# Patient Record
Sex: Male | Born: 1955 | Race: White | Hispanic: No | Marital: Single | Smoking: Current every day smoker
Health system: Southern US, Community
[De-identification: ages and names within clinical notes are randomized; demographics above are authoritative.]

## PROBLEM LIST (undated history)

## (undated) ENCOUNTER — Emergency Department (HOSPITAL_COMMUNITY): Discharge status: Against Medical Advice | Payer: Medicaid Other

## (undated) DIAGNOSIS — K432 Incisional hernia without obstruction or gangrene: Secondary | ICD-10-CM

## (undated) DIAGNOSIS — R251 Tremor, unspecified: Secondary | ICD-10-CM

## (undated) DIAGNOSIS — K219 Gastro-esophageal reflux disease without esophagitis: Secondary | ICD-10-CM

## (undated) DIAGNOSIS — B1920 Unspecified viral hepatitis C without hepatic coma: Secondary | ICD-10-CM

## (undated) DIAGNOSIS — K469 Unspecified abdominal hernia without obstruction or gangrene: Secondary | ICD-10-CM

## (undated) DIAGNOSIS — K7460 Unspecified cirrhosis of liver: Secondary | ICD-10-CM

## (undated) DIAGNOSIS — C801 Malignant (primary) neoplasm, unspecified: Secondary | ICD-10-CM

## (undated) DIAGNOSIS — Z5900 Homelessness unspecified: Secondary | ICD-10-CM

## (undated) DIAGNOSIS — Z8711 Personal history of peptic ulcer disease: Secondary | ICD-10-CM

## (undated) DIAGNOSIS — F1010 Alcohol abuse, uncomplicated: Secondary | ICD-10-CM

## (undated) DIAGNOSIS — Z5189 Encounter for other specified aftercare: Secondary | ICD-10-CM

## (undated) DIAGNOSIS — S065X9A Traumatic subdural hemorrhage with loss of consciousness of unspecified duration, initial encounter: Secondary | ICD-10-CM

## (undated) DIAGNOSIS — G250 Essential tremor: Secondary | ICD-10-CM

## (undated) HISTORY — DX: Unspecified cirrhosis of liver: K74.60

## (undated) HISTORY — PX: TONSILLECTOMY: SUR1361

## (undated) HISTORY — PX: UMBILICAL HERNIA REPAIR: SHX196

## (undated) HISTORY — DX: Unspecified viral hepatitis C without hepatic coma: B19.20

## (undated) HISTORY — PX: APPENDECTOMY: SHX54

---

## 2000-05-11 ENCOUNTER — Encounter: Payer: Self-pay | Admitting: Emergency Medicine

## 2000-05-11 ENCOUNTER — Emergency Department (HOSPITAL_COMMUNITY): Admission: EM | Admit: 2000-05-11 | Discharge: 2000-05-11 | Payer: Self-pay | Admitting: Emergency Medicine

## 2010-10-14 HISTORY — PX: HERNIA REPAIR: SHX51

## 2010-12-14 DIAGNOSIS — K7460 Unspecified cirrhosis of liver: Secondary | ICD-10-CM

## 2010-12-14 HISTORY — DX: Unspecified cirrhosis of liver: K74.60

## 2011-05-18 ENCOUNTER — Emergency Department (HOSPITAL_COMMUNITY)
Admission: EM | Admit: 2011-05-18 | Discharge: 2011-05-18 | Disposition: A | Discharge status: Home / Self Care | Payer: Self-pay | Attending: Emergency Medicine | Admitting: Emergency Medicine

## 2011-05-18 DIAGNOSIS — K439 Ventral hernia without obstruction or gangrene: Secondary | ICD-10-CM | POA: Insufficient documentation

## 2011-05-18 DIAGNOSIS — L02219 Cutaneous abscess of trunk, unspecified: Secondary | ICD-10-CM | POA: Insufficient documentation

## 2011-05-21 ENCOUNTER — Inpatient Hospital Stay (INDEPENDENT_AMBULATORY_CARE_PROVIDER_SITE_OTHER)
Admission: RE | Admit: 2011-05-21 | Discharge: 2011-05-21 | Disposition: A | Discharge status: Home / Self Care | Payer: Self-pay | Source: Ambulatory Visit

## 2011-05-21 DIAGNOSIS — K458 Other specified abdominal hernia without obstruction or gangrene: Secondary | ICD-10-CM

## 2011-05-21 DIAGNOSIS — L02219 Cutaneous abscess of trunk, unspecified: Secondary | ICD-10-CM

## 2011-05-26 ENCOUNTER — Other Ambulatory Visit (INDEPENDENT_AMBULATORY_CARE_PROVIDER_SITE_OTHER): Payer: Self-pay | Admitting: Surgery

## 2011-05-26 LAB — CBC WITH DIFFERENTIAL/PLATELET
Basophils Absolute: 0 10*3/uL (ref 0.0–0.1)
Basophils Relative: 0 % (ref 0–1)
Eosinophils Absolute: 0.2 10*3/uL (ref 0.0–0.7)
Eosinophils Relative: 5 % (ref 0–5)
HCT: 31.3 % — ABNORMAL LOW (ref 39.0–52.0)
Hemoglobin: 10 g/dL — ABNORMAL LOW (ref 13.0–17.0)
Lymphocytes Relative: 18 % (ref 12–46)
Lymphs Abs: 0.8 10*3/uL (ref 0.7–4.0)
MCH: 31.5 pg (ref 26.0–34.0)
MCHC: 31.9 g/dL (ref 30.0–36.0)
MCV: 98.7 fL (ref 78.0–100.0)
Monocytes Absolute: 0.4 10*3/uL (ref 0.1–1.0)
Monocytes Relative: 8 % (ref 3–12)
Neutro Abs: 3.1 10*3/uL (ref 1.7–7.7)
Neutrophils Relative %: 69 % (ref 43–77)
Platelets: 134 10*3/uL — ABNORMAL LOW (ref 150–400)
RBC: 3.17 MIL/uL — ABNORMAL LOW (ref 4.22–5.81)
RDW: 14.9 % (ref 11.5–15.5)
WBC: 4.5 10*3/uL (ref 4.0–10.5)

## 2011-05-26 LAB — COMPREHENSIVE METABOLIC PANEL
ALT: 16 U/L (ref 0–53)
AST: 39 U/L — ABNORMAL HIGH (ref 0–37)
Albumin: 2.6 g/dL — ABNORMAL LOW (ref 3.5–5.2)
Alkaline Phosphatase: 75 U/L (ref 39–117)
BUN: 11 mg/dL (ref 6–23)
CO2: 23 mEq/L (ref 19–32)
Calcium: 8.2 mg/dL — ABNORMAL LOW (ref 8.4–10.5)
Chloride: 106 mEq/L (ref 96–112)
Creat: 0.84 mg/dL (ref 0.50–1.35)
Glucose, Bld: 88 mg/dL (ref 70–99)
Potassium: 4.1 mEq/L (ref 3.5–5.3)
Sodium: 137 mEq/L (ref 135–145)
Total Bilirubin: 1 mg/dL (ref 0.3–1.2)
Total Protein: 7 g/dL (ref 6.0–8.3)

## 2011-05-26 LAB — AMYLASE: Amylase: 45 U/L (ref 0–105)

## 2011-05-27 LAB — APTT: aPTT: 41 seconds — ABNORMAL HIGH (ref 24–37)

## 2011-05-27 LAB — PROTIME-INR
INR: 1.42 (ref <1.50)
Prothrombin Time: 17.9 seconds — ABNORMAL HIGH (ref 11.6–15.2)

## 2011-06-03 ENCOUNTER — Encounter (INDEPENDENT_AMBULATORY_CARE_PROVIDER_SITE_OTHER): Payer: Self-pay | Admitting: Surgery

## 2011-06-12 ENCOUNTER — Ambulatory Visit (INDEPENDENT_AMBULATORY_CARE_PROVIDER_SITE_OTHER): Payer: Self-pay | Admitting: Surgery

## 2011-06-12 DIAGNOSIS — S31109A Unspecified open wound of abdominal wall, unspecified quadrant without penetration into peritoneal cavity, initial encounter: Secondary | ICD-10-CM

## 2011-06-12 NOTE — Progress Notes (Signed)
Subjective:     Patient ID: Noah Medina, male   DOB: Nov 18, 1956, 55 y.o.   MRN: [id number]    There were no vitals taken for this visit.    HPI He is here for a followup visit. Again he has significant cirrhosis and an open abdominal wound. We have been treating the wound with silver nitrate. He has no complaints today. He is now briefly seen a primary care physician.  Review of Systems     Objective:   Physical Exam On exam, he still has a significant open abdominal wound which is superficial. His ascites is slightly less. The wound shows excellent granulation tissue with no evidence of infection. His laboratory data shows that he is a high class B. cirrhotic.   Assessment:     Large abdominal wound in a cirrhotic patient    Plan:     I will now switch his wound care to hydrogel dressing changes q. day. I will see him back in 2 weeks. I may be able to eventually take him to the operating room if his cirrhosis improves.

## 2011-06-29 ENCOUNTER — Ambulatory Visit (INDEPENDENT_AMBULATORY_CARE_PROVIDER_SITE_OTHER): Payer: Self-pay | Admitting: Surgery

## 2011-06-29 DIAGNOSIS — S31109A Unspecified open wound of abdominal wall, unspecified quadrant without penetration into peritoneal cavity, initial encounter: Secondary | ICD-10-CM

## 2011-06-29 DIAGNOSIS — K7460 Unspecified cirrhosis of liver: Secondary | ICD-10-CM

## 2011-06-29 NOTE — Progress Notes (Signed)
Subjective:     Patient ID: Noah Medina, male   DOB: 04-Jul-1956, 55 y.o.   MRN: [id number]  HPI  He is here for another wound check. Unfortunately he is having difficulty following up with his primary care physicians regarding cirrhosis. Generally however he feels well. Review of Systems     Objective:   Physical Exam    On exam, his ascites is much less. The wound is still pretty wide but has excellent granulation tissue without evidence of infection. There is no leak of the ascites.Assessment:        Impression: Chronic abdominal wound with cirrhosis and ascites. Plan:        I will continue the current hydrogel wound care. I will see him back in 2 weeks. If he continues to improve I will consider hernia repair and excision of the chronic skin.

## 2011-07-20 ENCOUNTER — Other Ambulatory Visit (INDEPENDENT_AMBULATORY_CARE_PROVIDER_SITE_OTHER): Payer: Self-pay | Admitting: Surgery

## 2011-07-20 ENCOUNTER — Ambulatory Visit (INDEPENDENT_AMBULATORY_CARE_PROVIDER_SITE_OTHER): Payer: Self-pay | Admitting: Surgery

## 2011-07-20 DIAGNOSIS — IMO0002 Reserved for concepts with insufficient information to code with codable children: Secondary | ICD-10-CM | POA: Insufficient documentation

## 2011-07-20 DIAGNOSIS — T8189XA Other complications of procedures, not elsewhere classified, initial encounter: Secondary | ICD-10-CM

## 2011-07-20 DIAGNOSIS — K7460 Unspecified cirrhosis of liver: Secondary | ICD-10-CM

## 2011-07-20 NOTE — Progress Notes (Signed)
Subjective:     Patient ID: Noah Medina, male   DOB: 1956/05/07, 55 y.o.   MRN: [id number]  HPI  He is here for another visit. He has no complaints today. He is eating well and moving his bowels well. He has not seen his primary care physician since I saw him last Review of Systems     Objective:   Physical Exam On exam, he still has ascites and a large ventral hernia. The excoriated skin has not improved. There is no cellulitis or evidence of infection    Assessment:       Patient with ventral incisional hernia, skin breakdown, and cirrhosis Plan:     I am going to repeat his laboratory data so I can determine his child classification preoperatively prior to hernia repair

## 2011-07-21 LAB — COMPREHENSIVE METABOLIC PANEL
AST: 53 U/L — ABNORMAL HIGH (ref 0–37)
Alkaline Phosphatase: 72 U/L (ref 39–117)
BUN: 12 mg/dL (ref 6–23)
Glucose, Bld: 161 mg/dL — ABNORMAL HIGH (ref 70–99)
Potassium: 4.1 mEq/L (ref 3.5–5.3)
Sodium: 137 mEq/L (ref 135–145)
Total Bilirubin: 0.8 mg/dL (ref 0.3–1.2)
Total Protein: 6.8 g/dL (ref 6.0–8.3)

## 2011-07-21 LAB — CBC
Hemoglobin: 10.6 g/dL — ABNORMAL LOW (ref 13.0–17.0)
MCH: 31.2 pg (ref 26.0–34.0)
RBC: 3.4 MIL/uL — ABNORMAL LOW (ref 4.22–5.81)

## 2011-07-21 LAB — PROTIME-INR
INR: 1.5 — ABNORMAL HIGH (ref <1.50)
Prothrombin Time: 18.7 seconds — ABNORMAL HIGH (ref 11.6–15.2)

## 2011-07-27 ENCOUNTER — Encounter (INDEPENDENT_AMBULATORY_CARE_PROVIDER_SITE_OTHER): Payer: Self-pay | Admitting: Surgery

## 2011-07-27 ENCOUNTER — Ambulatory Visit (INDEPENDENT_AMBULATORY_CARE_PROVIDER_SITE_OTHER): Payer: Self-pay | Admitting: Surgery

## 2011-07-27 DIAGNOSIS — Z01818 Encounter for other preprocedural examination: Secondary | ICD-10-CM

## 2011-07-27 NOTE — Progress Notes (Signed)
Noah Medina is a 55 y.o. male.    Chief Complaint  Patient presents with  . Follow-up    hernia, chronic abdominal wound    HPI HPI He is here today for another visit regarding his hernia and chronic abdominal wound. I repeated his laboratory data and he still remains a child's class B. cirrhotic. He is symptomatic from the hernia, the chronic abdominal wound, and his ascites.  Past Medical History  Diagnosis Date  . Cirrhosis of liver   . Hepatitis C     Past Surgical History  Procedure Date  . Hernia repair     History reviewed. No pertinent family history.  Social History History  Substance Use Topics  . Smoking status: Current Everyday Smoker -- 1.0 packs/day  . Smokeless tobacco: Current User  . Alcohol Use: No    No Known Allergies  No current outpatient prescriptions on file.    Review of Systems ROS His current review of systems is negative for chest pain fever or shortness of breath Physical Exam Physical Exam  On examination, he is well and her parents. He has no encephalopathy. His lungs are clear bilaterally. Cardiovascular is regular rate and rhythm. His abdomen shows a well-healed incision. He has ascites. He has a large superficial abdominal wall wound. He has a reducible ventral hernia. There is no evidence of infection of the wound. His abdomen is nontender. There were no vitals taken for this visit.  Assessment/Plan This is a patient with cirrhosis, abdominal wall wound, and an incisional hernia. I am worried that the skin will go ahead and become more ischemic. I am recommending repair of the hernia with removal of the skin. He is eager to proceed with this. I discussed the risks with him in detail. These include but are not limited to bleeding, infection, worsening of his liver disease, need for use of mesh, injury to surround structures, as well as death given his disease. He understands and wishes to proceed.  Noah Medina A 07/27/2011,  4:54 PM

## 2011-08-12 ENCOUNTER — Encounter (HOSPITAL_COMMUNITY)
Admission: RE | Admit: 2011-08-12 | Discharge: 2011-08-12 | Disposition: A | Discharge status: Home / Self Care | Payer: Medicaid Other | Source: Ambulatory Visit | Attending: Surgery | Admitting: Surgery

## 2011-08-12 LAB — DIFFERENTIAL
Eosinophils Relative: 3 % (ref 0–5)
Lymphocytes Relative: 18 % (ref 12–46)
Monocytes Absolute: 0.3 10*3/uL (ref 0.1–1.0)
Monocytes Relative: 8 % (ref 3–12)
Neutro Abs: 3.1 10*3/uL (ref 1.7–7.7)

## 2011-08-12 LAB — COMPREHENSIVE METABOLIC PANEL
ALT: 31 U/L (ref 0–53)
Calcium: 8.8 mg/dL (ref 8.4–10.5)
Creatinine, Ser: 0.84 mg/dL (ref 0.50–1.35)
GFR calc Af Amer: >60 mL/min (ref >60)
Glucose, Bld: 120 mg/dL — ABNORMAL HIGH (ref 70–99)
Sodium: 140 mEq/L (ref 135–145)
Total Protein: 7.3 g/dL (ref 6.0–8.3)

## 2011-08-12 LAB — CBC
HCT: 30.9 % — ABNORMAL LOW (ref 39.0–52.0)
Hemoglobin: 10.8 g/dL — ABNORMAL LOW (ref 13.0–17.0)
MCH: 32.1 pg (ref 26.0–34.0)
MCHC: 35 g/dL (ref 30.0–36.0)
RDW: 15.7 % — ABNORMAL HIGH (ref 11.5–15.5)

## 2011-08-12 LAB — SURGICAL PCR SCREEN
MRSA, PCR: POSITIVE — AB
Staphylococcus aureus: POSITIVE — AB

## 2011-08-12 LAB — PROTIME-INR: Prothrombin Time: 17.4 seconds — ABNORMAL HIGH (ref 11.6–15.2)

## 2011-08-14 ENCOUNTER — Telehealth (INDEPENDENT_AMBULATORY_CARE_PROVIDER_SITE_OTHER): Payer: Self-pay | Admitting: General Surgery

## 2011-08-14 NOTE — Telephone Encounter (Signed)
Faxed back ok for surgery on Mr Bordonaro paged Dr Magnus Ivan and got a ok over the phone

## 2011-08-24 ENCOUNTER — Other Ambulatory Visit (INDEPENDENT_AMBULATORY_CARE_PROVIDER_SITE_OTHER): Payer: Self-pay | Admitting: Surgery

## 2011-08-24 ENCOUNTER — Inpatient Hospital Stay (HOSPITAL_COMMUNITY)
Admission: RE | Admit: 2011-08-24 | Discharge: 2011-08-28 | DRG: 336 | Disposition: A | Discharge status: Home / Self Care | Payer: Medicaid Other | Source: Ambulatory Visit | Attending: Surgery | Admitting: Surgery

## 2011-08-24 DIAGNOSIS — K432 Incisional hernia without obstruction or gangrene: Secondary | ICD-10-CM

## 2011-08-24 DIAGNOSIS — Z8619 Personal history of other infectious and parasitic diseases: Secondary | ICD-10-CM

## 2011-08-24 DIAGNOSIS — R188 Other ascites: Secondary | ICD-10-CM | POA: Diagnosis present

## 2011-08-24 DIAGNOSIS — K219 Gastro-esophageal reflux disease without esophagitis: Secondary | ICD-10-CM | POA: Diagnosis present

## 2011-08-24 DIAGNOSIS — F17200 Nicotine dependence, unspecified, uncomplicated: Secondary | ICD-10-CM | POA: Diagnosis present

## 2011-08-24 DIAGNOSIS — Z01812 Encounter for preprocedural laboratory examination: Secondary | ICD-10-CM

## 2011-08-24 DIAGNOSIS — K660 Peritoneal adhesions (postprocedural) (postinfection): Secondary | ICD-10-CM | POA: Diagnosis present

## 2011-08-24 DIAGNOSIS — K7460 Unspecified cirrhosis of liver: Secondary | ICD-10-CM | POA: Diagnosis present

## 2011-08-24 HISTORY — PX: HERNIA REPAIR: SHX51

## 2011-08-25 LAB — PROTIME-INR
INR: 1.77 — ABNORMAL HIGH (ref 0.00–1.49)
Prothrombin Time: 20.9 seconds — ABNORMAL HIGH (ref 11.6–15.2)

## 2011-08-25 LAB — CBC
MCV: 92 fL (ref 78.0–100.0)
Platelets: 103 10*3/uL — ABNORMAL LOW (ref 150–400)
RBC: 2.89 MIL/uL — ABNORMAL LOW (ref 4.22–5.81)
WBC: 9.4 10*3/uL (ref 4.0–10.5)

## 2011-08-25 LAB — COMPREHENSIVE METABOLIC PANEL
ALT: 17 U/L (ref 0–53)
AST: 26 U/L (ref 0–37)
Albumin: 2 g/dL — ABNORMAL LOW (ref 3.5–5.2)
Calcium: 8.1 mg/dL — ABNORMAL LOW (ref 8.4–10.5)
Creatinine, Ser: 0.74 mg/dL (ref 0.50–1.35)
Sodium: 137 mEq/L (ref 135–145)
Total Protein: 6.2 g/dL (ref 6.0–8.3)

## 2011-08-25 LAB — APTT: aPTT: 42 seconds — ABNORMAL HIGH (ref 24–37)

## 2011-08-26 LAB — APTT: aPTT: 40 seconds — ABNORMAL HIGH (ref 24–37)

## 2011-08-26 LAB — PROTIME-INR: INR: 1.69 — ABNORMAL HIGH (ref 0.00–1.49)

## 2011-08-28 LAB — BASIC METABOLIC PANEL
CO2: 30 mEq/L (ref 19–32)
Chloride: 102 mEq/L (ref 96–112)
GFR calc Af Amer: >60 mL/min (ref >60)
Sodium: 134 mEq/L — ABNORMAL LOW (ref 135–145)

## 2011-08-28 NOTE — Op Note (Signed)
[name], Medina NO.:  [id number]  MEDICAL RECORD NO.:  [id number]  LOCATION:  2899                         FACILITY:  [facility]  PHYSICIAN:  Abigail Miyamoto, M.D. DATE OF BIRTH:  [birth year]  DATE OF PROCEDURE:  08/24/2011 DATE OF DISCHARGE:                              OPERATIVE REPORT   PREOPERATIVE DIAGNOSIS:  Ventral incisional hernia with chronic skin wound.  POSTOPERATIVE DIAGNOSIS:  Ventral incisional hernia with chronic skin wound.  PROCEDURE:  Ventral incisional hernia repair with excision of 12 cm of excoriated chronic skin wound.  SURGEON:  Abigail Miyamoto, MD  ANESTHESIA:  General.  ESTIMATED BLOOD LOSS:  Minimal.  INDICATIONS:  Noah Medina is a 55 year old gentleman with liver failure and ascites.  He has a large ventral incisional hernia as well as a very large patch of excoriated skin, the fear is if this was connected, go ahead and proceed with a ruptured cyst, so decision was made to excise the skin and repair the hernia.  FINDINGS:  The patient was found to have 4-1/2 L of ascites, which was removed.  A 12-cm patch of excoriated skin was removed as well.  Mesh was not placed in the hernia repair.  PROCEDURE IN DETAIL:  The patient was brought to the operating room identified as Noah Medina.  He was placed supine on the operating room table and general anesthesia was induced.  His abdomen was then prepped and draped in usual sterile fashion.  The patient had a 12 cm area of open skin.  I performed a large elliptical incision around this.  This was all skin overlying the hernia sac.  Before I got into the peritoneal cavity, I made a small opening in the hernia sac, inserted a suction catheter into this, and drained 4-1/2 L of ascites from his abdomen.  I then completed excision of the skin and hernia sac with the electrocautery.  This was sent to Pathology for evaluation.  The surrounding skin and subcutaneous tissue was  quite easy, it was difficult to achieve hemostasis with cautery.  I was finally able to do this.  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  At this point, he had a large amount of adhesions in his upper abdomen.  I could not assess this area for hernia.  As he was still singly coagulopathic, we decided to forego lysis of these adhesions.  The rest of his lower abdomen was free of adhesions.  The fascia surrounding the hernia was also free of adhesion as well.  I thus reapproximated the midline fascia with 2 separate running #1 PDS sutures as well figure-of-eight #1 Novafil sutures.  Good closure of the fascia appeared to be achieved.  I then undermined the skin slightly more circumferentially.  I irrigated with saline and appeared to achieve hemostasis with cautery.  I then made a separate skin incision and placed a 19-French Blake drain into the wound.  I then closed the subcutaneous tissue with interrupted 3-0 Vicryl sutures and closed the skin with skin staples. The suction was placed to bulb suction.  Gauze and tape were applied. The  patient tolerated the procedure well.  All counts were correct at the end of procedure.  The patient was then extubated in the operating room and taken in a stable condition to the recovery room.     Abigail Miyamoto, M.D.     DB/MEDQ  D:  08/24/2011  T:  08/24/2011  Job:  [id number]  Electronically Signed by Abigail Miyamoto M.D. on 08/28/2011 08:25:22 AM

## 2011-09-02 ENCOUNTER — Encounter (INDEPENDENT_AMBULATORY_CARE_PROVIDER_SITE_OTHER): Payer: Self-pay | Admitting: Surgery

## 2011-09-03 ENCOUNTER — Encounter (INDEPENDENT_AMBULATORY_CARE_PROVIDER_SITE_OTHER): Payer: Self-pay | Admitting: Surgery

## 2011-09-07 ENCOUNTER — Encounter (INDEPENDENT_AMBULATORY_CARE_PROVIDER_SITE_OTHER): Payer: Self-pay | Admitting: Surgery

## 2011-09-07 ENCOUNTER — Ambulatory Visit (INDEPENDENT_AMBULATORY_CARE_PROVIDER_SITE_OTHER): Payer: Self-pay | Admitting: Surgery

## 2011-09-07 DIAGNOSIS — Z8719 Personal history of other diseases of the digestive system: Secondary | ICD-10-CM

## 2011-09-07 DIAGNOSIS — Z9889 Other specified postprocedural states: Secondary | ICD-10-CM

## 2011-09-07 DIAGNOSIS — Z09 Encounter for follow-up examination after completed treatment for conditions other than malignant neoplasm: Secondary | ICD-10-CM

## 2011-09-07 NOTE — Progress Notes (Signed)
Subjective:     Patient ID: Noah Medina, male   DOB: [birth year], 55 y.o.   MRN: [id number]  HPI He is here for his first postoperative visit status post repair of a ventral incisional hernia. Again he had a significant history of ascites and liver failure as well as a large chronic open wound. He reports that he has been doing well and has had no drainage from his incision. He reports that he remains on his spironolactone.  Review of Systems     Objective:   Physical Exam On exam, the incision is well healed except for one small open area. There is no evidence of wound infection. We've removed his staples and place Steri-Strips. He is building up some ascites but not as much as before. He still has a ventral hernia in his upper abdomen    Assessment:     Patient status post ventral incisional hernia repair with a history of cirrhosis and ascites and chronic wound    Plan:     I will remove his Dilaudid. He will still refrain from heavy lifting. He will stay on a spironolactone. I've encouraged him to see his primary care physician. I will see him back in one week to recheck his incision

## 2011-09-16 ENCOUNTER — Encounter (INDEPENDENT_AMBULATORY_CARE_PROVIDER_SITE_OTHER): Payer: Self-pay | Admitting: Surgery

## 2011-09-17 ENCOUNTER — Ambulatory Visit: Payer: Self-pay | Admitting: Gastroenterology

## 2011-09-22 ENCOUNTER — Encounter (INDEPENDENT_AMBULATORY_CARE_PROVIDER_SITE_OTHER): Payer: Self-pay | Admitting: Surgery

## 2011-09-23 NOTE — Discharge Summary (Signed)
  NAMEELIZABETH, HAFF NO.:  [id number]  MEDICAL RECORD NO.:  [id number]  LOCATION:  5128                         FACILITY:  [facility]  PHYSICIAN:  Abigail Miyamoto, M.D. DATE OF BIRTH:  Jan 29, 1956  DATE OF ADMISSION:  08/24/2011 DATE OF DISCHARGE:  08/28/2011                              DISCHARGE SUMMARY   DISCHARGE DIAGNOSES: 1. Ventral incisional hernia status post open repair. 2. Chronic nonhealing skin wounds status post wide excision. 3. Alcoholic cirrhosis with ascites.  SUMMARY OF HISTORY:  This is a 55 year old gentleman with a ventral incisional hernia.  He has a large area of excoriated skin and because of this and worrisome findings for potential eventual rupture, the decision was made to proceed to the operating room for excision of this area and repair of the hernia.  HOSPITAL COURSE:  The patient was admitted and taken to the operating room where he underwent ventral incisional repair with excision of 12 cm of excoriated chronic skin wound.  He was taken in a stable condition to regular surgical floor.  On postop day #1, he was stable.  He had been on coagulopathy.  His bilirubin was 1.3, platelets are 113, and hemoglobin is 9.3.  At this point, his Foley catheter was removed and his diet was advanced.  On postop day #2, he was passing flatus and tolerating full liquid diet and his diet was advanced further.  Incision remained stable.  By postoperative day #5, he was doing quite well, he was tolerating diet, he was ambulating well.  His incision appeared to be healing well.  There was minimal buildup of ascites.  Decision was made to discharge the patient to home.  DISCHARGE DIET:  Regular.  DISCHARGE ACTIVITY:  He will do no heavy lifting greater than 20 pounds for 6 weeks.  He may shower.  DISCHARGE FOLLOWUP:  He will follow up with [facility] in 1 week post discharge.     Abigail Miyamoto, M.D.     DB/MEDQ  D:   09/16/2011  T:  09/16/2011  Job:  [id number]  Electronically Signed by Abigail Miyamoto M.D. on 09/23/2011 12:05:21 PM

## 2011-09-28 ENCOUNTER — Ambulatory Visit (INDEPENDENT_AMBULATORY_CARE_PROVIDER_SITE_OTHER): Payer: Self-pay | Admitting: Surgery

## 2011-09-28 ENCOUNTER — Encounter (INDEPENDENT_AMBULATORY_CARE_PROVIDER_SITE_OTHER): Payer: Self-pay | Admitting: Surgery

## 2011-09-28 VITALS — BP 132/76 | HR 68 | Temp 97.6°F | Resp 16 | Ht 71.5 in | Wt 171.6 lb

## 2011-09-28 DIAGNOSIS — Z09 Encounter for follow-up examination after completed treatment for conditions other than malignant neoplasm: Secondary | ICD-10-CM

## 2011-09-28 NOTE — Progress Notes (Signed)
Subjective:     Patient ID: Noah Medina, male   DOB: 10/24/1956, 55 y.o.   MRN: [id number]  HPI  He continues to remain stable after open ventral hernia repair. Again he has a history of cirrhosis and ascites Review of Systems     Objective:   Physical Exam On examination, the wound continued to heal well. He does have diastases of the upper abdomen to the lower abdomen hernia repair feels intact. He has minimal buildup of ascites    Assessment:     Patient with cirrhosis and ascites status post ventral hernia repair and excision of chronic open wound    Plan:     He will continue his current postoperative course. He will refrain from heavy lifting. I again encouraged him to see his primary care physician. I will see him back here in 3 weeks for wound check

## 2011-10-08 ENCOUNTER — Ambulatory Visit: Payer: Self-pay | Admitting: Gastroenterology

## 2011-10-13 ENCOUNTER — Encounter (INDEPENDENT_AMBULATORY_CARE_PROVIDER_SITE_OTHER): Payer: Self-pay | Admitting: Surgery

## 2011-10-19 ENCOUNTER — Ambulatory Visit (INDEPENDENT_AMBULATORY_CARE_PROVIDER_SITE_OTHER): Payer: Self-pay | Admitting: Surgery

## 2011-10-19 ENCOUNTER — Encounter (INDEPENDENT_AMBULATORY_CARE_PROVIDER_SITE_OTHER): Payer: Self-pay | Admitting: Surgery

## 2011-10-19 VITALS — BP 120/82 | HR 84 | Temp 97.8°F | Resp 18 | Ht 71.0 in | Wt 179.4 lb

## 2011-10-19 DIAGNOSIS — Z09 Encounter for follow-up examination after completed treatment for conditions other than malignant neoplasm: Secondary | ICD-10-CM

## 2011-10-19 NOTE — Progress Notes (Signed)
Subjective:     Patient ID: Noah Medina, male   DOB: Feb 08, 1956, 55 y.o.   MRN: [id number]  HPI  He is here today for another wound check. He has no complaints. Review of Systems     Objective:   Physical Exam On exam, he has much less ascites today. There is only a small opening which I treated with silver nitrate on his midline wound. I did remove a separate Prolene suture.    Assessment:     Patient status post ventral hernia repair    Plan:     I will see him back in 4 weeks. He will again refrain from any heavy lifting. I do not believe he will be able to work again secondary to his persistent hernias and liver disease.

## 2011-11-04 ENCOUNTER — Encounter (INDEPENDENT_AMBULATORY_CARE_PROVIDER_SITE_OTHER): Payer: Self-pay | Admitting: Surgery

## 2011-11-23 ENCOUNTER — Encounter (INDEPENDENT_AMBULATORY_CARE_PROVIDER_SITE_OTHER): Payer: Self-pay | Admitting: Surgery

## 2011-11-23 ENCOUNTER — Ambulatory Visit (INDEPENDENT_AMBULATORY_CARE_PROVIDER_SITE_OTHER): Payer: Self-pay | Admitting: Surgery

## 2011-11-23 VITALS — BP 122/66 | HR 64 | Temp 98.6°F | Resp 20 | Ht 71.0 in | Wt 186.0 lb

## 2011-11-23 DIAGNOSIS — Z09 Encounter for follow-up examination after completed treatment for conditions other than malignant neoplasm: Secondary | ICD-10-CM

## 2011-11-23 NOTE — Progress Notes (Signed)
Subjective:     Patient ID: Noah Medina, male   DOB: [birth year], 55 y.o.   MRN: [id number]  HPI He is here for another visit status post ventral hernia repair. Again, he has chronic liver disease. He has reduced his spironolactone. He denies abdominal complaints. Review of Systems     Objective:   Physical Exam On exam, his small open wound is now completely healed. He does have a moderate amount of ascites    Assessment:     Patient status post ventral hernia repair as well as excision of chronic abdominal wall wound. Ongoing cirrhosis with ascites    Plan:     I will renew his spironolactone. Again, I strongly encouraged him to see his primary care physician which she has been refusing to do. I also encouraged him to quit smoking. I will see him back in one month

## 2011-12-05 ENCOUNTER — Emergency Department (HOSPITAL_COMMUNITY): Payer: Medicaid Other

## 2011-12-05 ENCOUNTER — Encounter (HOSPITAL_COMMUNITY): Payer: Self-pay | Admitting: Family Medicine

## 2011-12-05 ENCOUNTER — Other Ambulatory Visit: Payer: Self-pay

## 2011-12-05 ENCOUNTER — Emergency Department (HOSPITAL_COMMUNITY)
Admission: EM | Admit: 2011-12-05 | Discharge: 2011-12-05 | Disposition: A | Discharge status: Home / Self Care | Payer: Medicaid Other | Attending: Emergency Medicine | Admitting: Emergency Medicine

## 2011-12-05 DIAGNOSIS — K7460 Unspecified cirrhosis of liver: Secondary | ICD-10-CM | POA: Insufficient documentation

## 2011-12-05 DIAGNOSIS — B1920 Unspecified viral hepatitis C without hepatic coma: Secondary | ICD-10-CM | POA: Insufficient documentation

## 2011-12-05 DIAGNOSIS — R0789 Other chest pain: Secondary | ICD-10-CM

## 2011-12-05 DIAGNOSIS — K439 Ventral hernia without obstruction or gangrene: Secondary | ICD-10-CM | POA: Insufficient documentation

## 2011-12-05 LAB — URINALYSIS, ROUTINE W REFLEX MICROSCOPIC
Bilirubin Urine: NEGATIVE
Glucose, UA: NEGATIVE mg/dL
Ketones, ur: NEGATIVE mg/dL
Leukocytes, UA: NEGATIVE
Protein, ur: NEGATIVE mg/dL
pH: 6.5 (ref 5.0–8.0)

## 2011-12-05 LAB — COMPREHENSIVE METABOLIC PANEL
ALT: 22 U/L (ref 0–53)
AST: 68 U/L — ABNORMAL HIGH (ref 0–37)
Alkaline Phosphatase: 61 U/L (ref 39–117)
CO2: 21 mEq/L (ref 19–32)
Calcium: 8.3 mg/dL — ABNORMAL LOW (ref 8.4–10.5)
Chloride: 106 mEq/L (ref 96–112)
GFR calc Af Amer: >90 mL/min (ref >90)
GFR calc non Af Amer: >90 mL/min (ref >90)
Glucose, Bld: 98 mg/dL (ref 70–99)
Sodium: 138 mEq/L (ref 135–145)
Total Bilirubin: 1.2 mg/dL (ref 0.3–1.2)

## 2011-12-05 LAB — RAPID URINE DRUG SCREEN, HOSP PERFORMED
Amphetamines: NOT DETECTED
Barbiturates: NOT DETECTED
Benzodiazepines: NOT DETECTED
Tetrahydrocannabinol: POSITIVE — AB

## 2011-12-05 LAB — DIFFERENTIAL
Basophils Absolute: 0 10*3/uL (ref 0.0–0.1)
Eosinophils Relative: 2 % (ref 0–5)
Lymphocytes Relative: 26 % (ref 12–46)
Lymphs Abs: 1.5 10*3/uL (ref 0.7–4.0)
Neutro Abs: 3.5 10*3/uL (ref 1.7–7.7)

## 2011-12-05 LAB — CBC
HCT: 26.7 % — ABNORMAL LOW (ref 39.0–52.0)
MCV: 90.5 fL (ref 78.0–100.0)
Platelets: 61 10*3/uL — ABNORMAL LOW (ref 150–400)
RBC: 2.95 MIL/uL — ABNORMAL LOW (ref 4.22–5.81)
RDW: 16.6 % — ABNORMAL HIGH (ref 11.5–15.5)
WBC: 5.7 10*3/uL (ref 4.0–10.5)

## 2011-12-05 MED ORDER — KETOROLAC TROMETHAMINE 30 MG/ML IJ SOLN
30.0000 mg | Freq: Once | INTRAMUSCULAR | Status: AC
  Administered 2011-12-05: 30 mg via INTRAVENOUS
  Filled 2011-12-05: qty 1

## 2011-12-05 MED ORDER — MORPHINE SULFATE 4 MG/ML IJ SOLN
4.0000 mg | Freq: Once | INTRAMUSCULAR | Status: AC
  Administered 2011-12-05: 4 mg via INTRAVENOUS
  Filled 2011-12-05: qty 1

## 2011-12-05 MED ORDER — DEXAMETHASONE SODIUM PHOSPHATE 10 MG/ML IJ SOLN
10.0000 mg | Freq: Once | INTRAMUSCULAR | Status: AC
  Administered 2011-12-05: 10 mg via INTRAVENOUS
  Filled 2011-12-05: qty 1

## 2011-12-05 MED ORDER — ASPIRIN 81 MG PO CHEW
324.0000 mg | CHEWABLE_TABLET | Freq: Once | ORAL | Status: AC
  Administered 2011-12-05: 324 mg via ORAL
  Filled 2011-12-05: qty 4

## 2011-12-05 MED ORDER — HYDROCODONE-ACETAMINOPHEN 5-325 MG PO TABS: 1.0000 | ORAL_TABLET | Freq: Four times a day (QID) | ORAL | Status: AC | PRN

## 2011-12-05 MED ORDER — METHOCARBAMOL 500 MG PO TABS: 500.0000 mg | ORAL_TABLET | Freq: Two times a day (BID) | ORAL | Status: AC

## 2011-12-05 NOTE — ED Notes (Signed)
Chest pain that started at 9 am today. sts Felt like someone was beating him in the chest.

## 2011-12-05 NOTE — ED Provider Notes (Signed)
Medical screening examination/treatment/procedure(s) were performed by non-physician practitioner and as supervising physician I was immediately available for consultation/collaboration.  Larrissa Stivers L Shellie Rogoff, MD 12/05/11 2340 

## 2011-12-05 NOTE — ED Notes (Signed)
Pt is returned from xray.

## 2011-12-05 NOTE — ED Notes (Signed)
IV, 1 nitro, and ASA given to pt PTA

## 2011-12-05 NOTE — ED Provider Notes (Signed)
History     CSN: [id number]  Arrival date & time 12/05/11  1641   First MD Initiated Contact with Patient 12/05/11 1648     5:25 PM HPI Patient reports this morning she woke up suddenly due to a sharp left-sided chest pain. States pain is constant. Reports pain seems to be improved with applying pressure. Denies other associated symptoms. Reports only risk factor in his early family history of benign. States brother had an MI at age 56. Patient is a 55 y.o. male presenting with chest pain. The history is provided by the patient.  Chest Pain The chest pain began 6 - 12 hours ago. Chest pain occurs constantly. The chest pain is unchanged. The severity of the pain is severe. The quality of the pain is described as sharp. The pain does not radiate. Pertinent negatives for primary symptoms include no fever, no syncope, no shortness of breath, no cough, no palpitations, no abdominal pain, no nausea and no vomiting.  Pertinent negatives for associated symptoms include no diaphoresis, no lower extremity edema, no near-syncope, no numbness, no orthopnea and no weakness. He tried nothing for the symptoms. Risk factors include male gender.  Pertinent negatives for past medical history include no diabetes, no hyperlipidemia and no hypertension.  His family medical history is significant for early MI in family.     Past Medical History  Diagnosis Date  . Cirrhosis of liver   . Hepatitis C     Past Surgical History  Procedure Date  . Hernia repair 2012    History reviewed. No pertinent family history.  History  Substance Use Topics  . Smoking status: Current Everyday Smoker -- 1.0 packs/day  . Smokeless tobacco: Current User  . Alcohol Use: No      Review of Systems  Constitutional: Negative for fever and diaphoresis.  Respiratory: Negative for cough and shortness of breath.   Cardiovascular: Positive for chest pain. Negative for palpitations, orthopnea, syncope and near-syncope.    Gastrointestinal: Negative for nausea, vomiting and abdominal pain.  Neurological: Negative for weakness and numbness.  All other systems reviewed and are negative.    Allergies  Review of patient's allergies indicates no known allergies.  Home Medications   Current Outpatient Rx  Name Route Sig Dispense Refill  . HYDROMORPHONE HCL 2 MG PO TABS Oral Take 2 mg by mouth every 6 (six) hours as needed.        BP 130/66  Pulse 84  Temp(Src) 98.9 F (37.2 C) (Oral)  Resp 18  SpO2 96%  Physical Exam  Vitals reviewed. Constitutional: He is oriented to person, place, and time. He appears well-developed and well-nourished.  HENT:  Head: Normocephalic and atraumatic.  Eyes: Conjunctivae are normal. Pupils are equal, round, and reactive to light.  Neck: Normal range of motion. Neck supple.  Cardiovascular: Normal rate, regular rhythm and normal heart sounds.  Exam reveals no gallop and no friction rub.   No murmur heard. Pulmonary/Chest: Effort normal and breath sounds normal. He has no wheezes. He has no rales. He exhibits no tenderness.    Abdominal: Soft. Bowel sounds are normal. He exhibits no distension and no mass. There is no tenderness. There is no rebound and no guarding. A hernia is present. Hernia confirmed positive in the ventral area.  Neurological: He is alert and oriented to person, place, and time.  Skin: Skin is warm and dry. No rash noted. No erythema. No pallor.  Psychiatric: He has a normal mood and affect. His  behavior is normal.    ED Course  Procedures  Results for orders placed during the hospital encounter of 12/05/11  CBC      Component Value Range   WBC 5.7  4.0 - 10.5 (K/uL)   RBC 2.95 (*) 4.22 - 5.81 (MIL/uL)   Hemoglobin 9.3 (*) 13.0 - 17.0 (g/dL)   HCT 19.1 (*) 47.8 - 52.0 (%)   MCV 90.5  78.0 - 100.0 (fL)   MCH 31.5  26.0 - 34.0 (pg)   MCHC 34.8  30.0 - 36.0 (g/dL)   RDW 29.5 (*) 62.1 - 15.5 (%)   Platelets 61 (*) 150 - 400 (K/uL)   DIFFERENTIAL      Component Value Range   Neutrophils Relative 61  43 - 77 (%)   Neutro Abs 3.5  1.7 - 7.7 (K/uL)   Lymphocytes Relative 26  12 - 46 (%)   Lymphs Abs 1.5  0.7 - 4.0 (K/uL)   Monocytes Relative 10  3 - 12 (%)   Monocytes Absolute 0.6  0.1 - 1.0 (K/uL)   Eosinophils Relative 2  0 - 5 (%)   Eosinophils Absolute 0.1  0.0 - 0.7 (K/uL)   Basophils Relative 1  0 - 1 (%)   Basophils Absolute 0.0  0.0 - 0.1 (K/uL)  COMPREHENSIVE METABOLIC PANEL      Component Value Range   Sodium 138  135 - 145 (mEq/L)   Potassium 3.1 (*) 3.5 - 5.1 (mEq/L)   Chloride 106  96 - 112 (mEq/L)   CO2 21  19 - 32 (mEq/L)   Glucose, Bld 98  70 - 99 (mg/dL)   BUN 12  6 - 23 (mg/dL)   Creatinine, Ser 3.08  0.50 - 1.35 (mg/dL)   Calcium 8.3 (*) 8.4 - 10.5 (mg/dL)   Total Protein 6.7  6.0 - 8.3 (g/dL)   Albumin 2.5 (*) 3.5 - 5.2 (g/dL)   AST 68 (*) 0 - 37 (U/L)   ALT 22  0 - 53 (U/L)   Alkaline Phosphatase 61  39 - 117 (U/L)   Total Bilirubin 1.2  0.3 - 1.2 (mg/dL)   GFR calc non Af Amer >90  >90 (mL/min)   GFR calc Af Amer >90  >90 (mL/min)  LIPASE, BLOOD      Component Value Range   Lipase 18  11 - 59 (U/L)  ETHANOL      Component Value Range   Alcohol, Ethyl (B) 226 (*) 0 - 11 (mg/dL)  URINALYSIS, ROUTINE W REFLEX MICROSCOPIC      Component Value Range   Color, Urine YELLOW  YELLOW    APPearance CLEAR  CLEAR    Specific Gravity, Urine 1.008  1.005 - 1.030    pH 6.5  5.0 - 8.0    Glucose, UA NEGATIVE  NEGATIVE (mg/dL)   Hgb urine dipstick SMALL (*) NEGATIVE    Bilirubin Urine NEGATIVE  NEGATIVE    Ketones, ur NEGATIVE  NEGATIVE (mg/dL)   Protein, ur NEGATIVE  NEGATIVE (mg/dL)   Urobilinogen, UA 1.0  0.0 - 1.0 (mg/dL)   Nitrite NEGATIVE  NEGATIVE    Leukocytes, UA NEGATIVE  NEGATIVE   URINE RAPID DRUG SCREEN (HOSP PERFORMED)      Component Value Range   Opiates POSITIVE (*) NONE DETECTED    Cocaine NONE DETECTED  NONE DETECTED    Benzodiazepines NONE DETECTED  NONE DETECTED     Amphetamines NONE DETECTED  NONE DETECTED    Tetrahydrocannabinol POSITIVE (*) NONE  DETECTED    Barbiturates NONE DETECTED  NONE DETECTED   POCT I-STAT TROPONIN I      Component Value Range   Troponin i, poc 0.01  0.00 - 0.08 (ng/mL)   Comment 3           URINE MICROSCOPIC-ADD ON      Component Value Range   Squamous Epithelial / LPF RARE  RARE    RBC / HPF 0-2  <3 (RBC/hpf)   Dg Chest 2 View  12/05/2011  *RADIOLOGY REPORT*  Clinical Data: Mid abdominal pain.  CHEST - 2 VIEW  Comparison: None  Findings: Heart and mediastinal contours are within normal limits. No focal opacities or effusions.  No acute bony abnormality.  IMPRESSION: No active cardiopulmonary disease.  Original Report Authenticated By: Cyndie Chime, M.D.     MDM  ED ECG REPORT   Date: 12/05/2011  EKG Time: 6:21 PM  Rate: 87  Rhythm: normal sinus rhythm,  normal EKG, normal sinus rhythm, unchanged from previous tracings  Axis: NML  Intervals:none  ST&T Change: NML  Narrative Interpretation: NML       Discussed relatively normal labs. Patient's hemoglobin was low but appears to be baseline when compared to prior labs. Also discussed elevated alcohol level with diagnosis of cirrhosis and liver disease. Advised against drinking alcohol. States he only drank one "extra-large" beer. If the patient's pain is most likely musculoskeletal will discharge home with Robaxin and Vicodin. States Toradol has helped pain however wanted to avoid anti-inflammatory medications as patient reports he gets severe gastric upset and taking them. Patient requests referrals for primary care physician. Otherwise advised return to the emergency department for worsening symptoms. Patient agrees and is ready for discharge.           Thomasene Lot, [place] 12/05/11 2100

## 2011-12-15 DIAGNOSIS — K469 Unspecified abdominal hernia without obstruction or gangrene: Secondary | ICD-10-CM

## 2011-12-15 HISTORY — DX: Unspecified abdominal hernia without obstruction or gangrene: K46.9

## 2011-12-22 ENCOUNTER — Encounter (INDEPENDENT_AMBULATORY_CARE_PROVIDER_SITE_OTHER): Payer: Self-pay | Admitting: Surgery

## 2011-12-22 ENCOUNTER — Ambulatory Visit (INDEPENDENT_AMBULATORY_CARE_PROVIDER_SITE_OTHER): Payer: Medicaid Other | Admitting: Surgery

## 2011-12-22 VITALS — BP 142/68 | HR 76 | Temp 97.6°F | Resp 18 | Ht 71.5 in | Wt 183.8 lb

## 2011-12-22 DIAGNOSIS — L0390 Cellulitis, unspecified: Secondary | ICD-10-CM

## 2011-12-22 MED ORDER — CEPHALEXIN 500 MG PO CAPS: 500.0000 mg | ORAL_CAPSULE | Freq: Four times a day (QID) | ORAL | Status: AC

## 2011-12-22 NOTE — Progress Notes (Signed)
Subjective:     Patient ID: Noah Medina, male   DOB: [birth year], 56 y.o.   MRN: [id number]  HPI The patient presents to to swelling of his previous abdominal incision into her wounds after ventral hernia repair in September of 2012 by Dr. Rayburn Ma. He denies any fever or chills. He has chronic liver disease and his chronic ascites. 2 areas of skin and broken down below his umbilicus. There is minimal drainage from these areas. He has no fever or chills. He has no bowel pain.  Review of Systems  Constitutional: Negative.   HENT: Negative.   Gastrointestinal: Positive for abdominal distention. Negative for abdominal pain.  Skin: Positive for wound.       Objective:   Physical Exam  Constitutional: He is oriented to person, place, and time. He appears well-developed and well-nourished.  Pulmonary/Chest: Effort normal and breath sounds normal.  Abdominal:         Ascites noted. Significant bulge below his umbilicus. Recurrent ventral hernia soft and reducible. Significant ascites. 2 small open areas of skin breakdown below the umbilicus with no drainage and minimal redness. No abscess.  Neurological: He is alert and oriented to person, place, and time. GCS eye subscore is 4. GCS verbal subscore is 5. GCS motor subscore is 6.       Assessment:     Recurrent incisional hernia  Chronic liver disease and ascites  Skin breakdown of her abdomen with minimal redness    Plan:     He is scheduled to return in 2 days from now to see Dr. Rayburn Ma. He may have early cellulitis around these areas of skin breakdown. I will start Keflex 500 mg p.o. q.6 hours.

## 2011-12-22 NOTE — Patient Instructions (Signed)
Keep follow up on Thursday.

## 2011-12-24 ENCOUNTER — Ambulatory Visit (INDEPENDENT_AMBULATORY_CARE_PROVIDER_SITE_OTHER): Payer: Medicaid Other | Admitting: Surgery

## 2011-12-24 ENCOUNTER — Encounter (INDEPENDENT_AMBULATORY_CARE_PROVIDER_SITE_OTHER): Payer: Self-pay | Admitting: Surgery

## 2011-12-24 VITALS — BP 136/82 | HR 70 | Temp 98.1°F | Resp 18 | Ht 71.5 in | Wt 183.8 lb

## 2011-12-24 DIAGNOSIS — T8189XA Other complications of procedures, not elsewhere classified, initial encounter: Secondary | ICD-10-CM

## 2011-12-24 NOTE — Progress Notes (Signed)
Subjective:     Patient ID: Noah Medina, male   DOB: 20-May-1956, 56 y.o.   MRN: [id number]  HPI  He is here for my followup where he was seen here in the office 2 days ago. He developed a small wound on his abdominal wall. He is now on antibiotics. Review of Systems     Objective:   Physical Exam On exam, he has ascites and recurrent ventral hernia. Small wound is now closed. There is no erythema    Assessment:     Patient with a recurrent hernia and abdominal wound    Plan:     As the wound is now closed, I want him to stop the hydrogel. He will finish his antibiotics. I will resume his spironolactone.

## 2012-01-06 ENCOUNTER — Encounter (INDEPENDENT_AMBULATORY_CARE_PROVIDER_SITE_OTHER): Payer: Self-pay | Admitting: Surgery

## 2012-01-06 ENCOUNTER — Ambulatory Visit (INDEPENDENT_AMBULATORY_CARE_PROVIDER_SITE_OTHER): Payer: Medicaid Other | Admitting: Surgery

## 2012-01-06 VITALS — BP 130/80 | HR 76 | Temp 98.1°F | Resp 18 | Ht 71.0 in | Wt 185.4 lb

## 2012-01-06 DIAGNOSIS — T8189XA Other complications of procedures, not elsewhere classified, initial encounter: Secondary | ICD-10-CM

## 2012-01-06 NOTE — Progress Notes (Signed)
Subjective:     Patient ID: Noah Medina, male   DOB: 10-Feb-1956, 56 y.o.   MRN: [id number]  HPI He is doing well and has no complaints. He reports the wound is completely closed  Review of Systems     Objective:   Physical Exam On exam, the wound is closed and there is no erythema. He does have recurrent hernia    Assessment:     Healed abdominal wound, recurrent hernia, liver failure    Plan:     I renewed his spironolactone. I will see him back in one month

## 2012-02-08 ENCOUNTER — Encounter (INDEPENDENT_AMBULATORY_CARE_PROVIDER_SITE_OTHER): Payer: Self-pay | Admitting: Surgery

## 2012-02-08 ENCOUNTER — Ambulatory Visit (INDEPENDENT_AMBULATORY_CARE_PROVIDER_SITE_OTHER): Payer: Medicaid Other | Admitting: Surgery

## 2012-02-08 VITALS — BP 142/78 | HR 84 | Temp 99.2°F | Resp 16 | Ht 71.0 in | Wt 198.4 lb

## 2012-02-08 DIAGNOSIS — K432 Incisional hernia without obstruction or gangrene: Secondary | ICD-10-CM

## 2012-02-08 NOTE — Progress Notes (Signed)
Subjective:     Patient ID: Noah Medina, male   DOB: 1956/07/24, 56 y.o.   MRN: [id number]  HPI He is here for another followup. He reports that the hernia is getting bigger and he is getting worsening ascites. He has seen his primary care physician and will be having labs done this Thursday  Review of Systems     Objective:   Physical Exam On exam, there is a large recurrent ventral hernia with ascites. There is some mild skin breakdown    Assessment:     Patient with cirrhosis and recurrent ventral hernia    Plan:     Hopefully he can be cleaned up more prior to surgery to repair the hernia definitively with mesh. He will continue his local wound care. I will see him back in 3-4 weeks

## 2012-03-08 ENCOUNTER — Encounter (INDEPENDENT_AMBULATORY_CARE_PROVIDER_SITE_OTHER): Payer: Self-pay | Admitting: Surgery

## 2012-03-08 ENCOUNTER — Ambulatory Visit (INDEPENDENT_AMBULATORY_CARE_PROVIDER_SITE_OTHER): Payer: Medicaid Other | Admitting: Surgery

## 2012-03-08 VITALS — BP 141/83 | HR 82 | Temp 99.8°F | Resp 18 | Ht 71.0 in | Wt 183.4 lb

## 2012-03-08 DIAGNOSIS — K432 Incisional hernia without obstruction or gangrene: Secondary | ICD-10-CM

## 2012-03-08 NOTE — Progress Notes (Signed)
Subjective:     Patient ID: Noah Medina, male   DOB: [birth year], 56 y.o.   MRN: [id number]  HPI He is here for a followup visit regarding his incisional hernia. He reports he is doing well. He is now on a different diuretic and is having better success with this ascites  Review of Systems     Objective:   Physical Exam On exam, he has a large incisional hernia which has recurred. He also is having some skin breakdown. His ascites seems less    Assessment:     Incisional hernia    Plan:     I will now scheduled him for a laparoscopic possible open repair with mesh and

## 2012-03-16 ENCOUNTER — Encounter (HOSPITAL_COMMUNITY): Payer: Self-pay | Admitting: Pharmacy Technician

## 2012-03-26 DIAGNOSIS — K219 Gastro-esophageal reflux disease without esophagitis: Secondary | ICD-10-CM

## 2012-03-26 HISTORY — DX: Gastro-esophageal reflux disease without esophagitis: K21.9

## 2012-03-26 HISTORY — PX: OTHER SURGICAL HISTORY: SHX169

## 2012-03-26 HISTORY — PX: INCISIONAL HERNIA REPAIR: SHX193

## 2012-03-28 ENCOUNTER — Telehealth (INDEPENDENT_AMBULATORY_CARE_PROVIDER_SITE_OTHER): Payer: Self-pay | Admitting: General Surgery

## 2012-03-28 ENCOUNTER — Encounter (HOSPITAL_COMMUNITY): Payer: Self-pay

## 2012-03-28 ENCOUNTER — Encounter (HOSPITAL_COMMUNITY)
Admission: RE | Admit: 2012-03-28 | Discharge: 2012-03-28 | Disposition: A | Discharge status: Home / Self Care | Payer: Medicaid Other | Source: Ambulatory Visit | Attending: Surgery | Admitting: Surgery

## 2012-03-28 DIAGNOSIS — Z5189 Encounter for other specified aftercare: Secondary | ICD-10-CM

## 2012-03-28 DIAGNOSIS — Z8711 Personal history of peptic ulcer disease: Secondary | ICD-10-CM

## 2012-03-28 DIAGNOSIS — B1920 Unspecified viral hepatitis C without hepatic coma: Secondary | ICD-10-CM

## 2012-03-28 DIAGNOSIS — R251 Tremor, unspecified: Secondary | ICD-10-CM

## 2012-03-28 DIAGNOSIS — IMO0001 Reserved for inherently not codable concepts without codable children: Secondary | ICD-10-CM

## 2012-03-28 HISTORY — DX: Reserved for inherently not codable concepts without codable children: IMO0001

## 2012-03-28 HISTORY — DX: Personal history of peptic ulcer disease: Z87.11

## 2012-03-28 HISTORY — DX: Gastro-esophageal reflux disease without esophagitis: K21.9

## 2012-03-28 HISTORY — DX: Unspecified viral hepatitis C without hepatic coma: B19.20

## 2012-03-28 HISTORY — DX: Encounter for other specified aftercare: Z51.89

## 2012-03-28 HISTORY — DX: Tremor, unspecified: R25.1

## 2012-03-28 LAB — DIFFERENTIAL
Basophils Absolute: 0 10*3/uL (ref 0.0–0.1)
Basophils Relative: 0 % (ref 0–1)
Eosinophils Relative: 1 % (ref 0–5)
Monocytes Absolute: 0.2 10*3/uL (ref 0.1–1.0)

## 2012-03-28 LAB — SURGICAL PCR SCREEN
MRSA, PCR: POSITIVE — AB
Staphylococcus aureus: POSITIVE — AB

## 2012-03-28 LAB — CBC
Hemoglobin: 11.8 g/dL — ABNORMAL LOW (ref 13.0–17.0)
Platelets: 60 10*3/uL — ABNORMAL LOW (ref 150–400)
RBC: 3.58 MIL/uL — ABNORMAL LOW (ref 4.22–5.81)

## 2012-03-28 LAB — PROTIME-INR: Prothrombin Time: 17.9 seconds — ABNORMAL HIGH (ref 11.6–15.2)

## 2012-03-28 LAB — APTT: aPTT: 34 seconds (ref 24–37)

## 2012-03-28 NOTE — Telephone Encounter (Signed)
Called over to [facility] and gave ok for surgery

## 2012-03-28 NOTE — Pre-Procedure Instructions (Addendum)
03-28-12 Lab results -CMP(03-18-12) with chart. EKG/ CXR not required per history and anesthesia guidelines. 03-28-12 1320 Pt. Aware to use Mupirocin Oint and bring to hospital for continued use x2 daily for 5 days. Also aware Contact Isolation is necessary. Saunders Glance 03-28-12 1630 per Tamala Bari- Dr. Magnus Ivan states Pt. Okay for surgery with abnormal labs viewable in Epic.W. Kennon Portela. Lab results faxed also to office.

## 2012-03-28 NOTE — Patient Instructions (Signed)
20 KNUT RONDINELLI  03/28/2012   Your procedure is scheduled on:4-17   -2013  Report to [facility] at   0630    AM.  Call this number if you have problems the morning of surgery: [phone]   Remember:   Do not eat food:After Midnight.   Take these medicines the morning of surgery with A SIP OF WATER: none   Do not wear jewelry, make-up or nail polish.  Do not wear lotions, powders, or perfumes. You may wear deodorant.  Do not shave 48 hours prior to surgery.(face and neck okay, no shaving of legs)  Do not bring valuables to the hospital.  Contacts, dentures or bridgework may not be worn into surgery.  Leave suitcase in the car. After surgery it may be brought to your room.  For patients admitted to the hospital, checkout time is 11:00 AM the day of discharge.   Patients discharged the day of surgery will not be allowed to drive home.  Name and phone number of your driver: son-Travis [phone]  Special Instructions: CHG Shower Use Special Wash: 1/2 bottle night before surgery and 1/2 bottle morning of surgery.(avoid face and genitals)   Please read over the following fact sheets that you were given: MRSA Information.

## 2012-03-30 ENCOUNTER — Encounter (HOSPITAL_COMMUNITY): Payer: Self-pay | Admitting: Anesthesiology

## 2012-03-30 ENCOUNTER — Encounter (HOSPITAL_COMMUNITY): Payer: Self-pay | Admitting: *Deleted

## 2012-03-30 ENCOUNTER — Ambulatory Visit (HOSPITAL_COMMUNITY): Payer: Medicaid Other | Admitting: Anesthesiology

## 2012-03-30 ENCOUNTER — Encounter (HOSPITAL_COMMUNITY)
Admission: RE | Disposition: A | Discharge status: Home / Self Care | Payer: Self-pay | Source: Ambulatory Visit | Attending: Surgery

## 2012-03-30 ENCOUNTER — Inpatient Hospital Stay (HOSPITAL_COMMUNITY)
Admission: RE | Admit: 2012-03-30 | Discharge: 2012-03-31 | DRG: 355 | Disposition: A | Discharge status: Home / Self Care | Payer: Medicaid Other | Source: Ambulatory Visit | Attending: Surgery | Admitting: Surgery

## 2012-03-30 DIAGNOSIS — K432 Incisional hernia without obstruction or gangrene: Secondary | ICD-10-CM

## 2012-03-30 DIAGNOSIS — F17200 Nicotine dependence, unspecified, uncomplicated: Secondary | ICD-10-CM | POA: Diagnosis present

## 2012-03-30 DIAGNOSIS — K7460 Unspecified cirrhosis of liver: Secondary | ICD-10-CM | POA: Diagnosis present

## 2012-03-30 DIAGNOSIS — B1920 Unspecified viral hepatitis C without hepatic coma: Secondary | ICD-10-CM | POA: Diagnosis present

## 2012-03-30 DIAGNOSIS — K219 Gastro-esophageal reflux disease without esophagitis: Secondary | ICD-10-CM | POA: Diagnosis present

## 2012-03-30 DIAGNOSIS — Z01812 Encounter for preprocedural laboratory examination: Secondary | ICD-10-CM

## 2012-03-30 HISTORY — PX: INCISIONAL HERNIA REPAIR: SHX193

## 2012-03-30 LAB — CBC
HCT: 27.2 % — ABNORMAL LOW (ref 39.0–52.0)
MCV: 95.1 fL (ref 78.0–100.0)
RBC: 2.86 MIL/uL — ABNORMAL LOW (ref 4.22–5.81)
WBC: 4.7 10*3/uL (ref 4.0–10.5)

## 2012-03-30 SURGERY — REPAIR, HERNIA, INCISIONAL, LAPAROSCOPIC
Anesthesia: General | Wound class: Clean

## 2012-03-30 MED ORDER — LACTATED RINGERS IV SOLN
INTRAVENOUS | Status: DC
  Administered 2012-03-30: 12:00:00 via INTRAVENOUS

## 2012-03-30 MED ORDER — POTASSIUM CHLORIDE IN NACL 20-0.9 MEQ/L-% IV SOLN
INTRAVENOUS | Status: DC
  Administered 2012-03-30 – 2012-03-31 (×2): via INTRAVENOUS
  Filled 2012-03-30 (×6): qty 1000

## 2012-03-30 MED ORDER — CHLORHEXIDINE GLUCONATE 4 % EX LIQD
1.0000 "application " | Freq: Once | CUTANEOUS | Status: DC
  Administered 2012-03-30: 1 via TOPICAL

## 2012-03-30 MED ORDER — ONDANSETRON HCL 4 MG/2ML IJ SOLN
INTRAMUSCULAR | Status: DC | PRN
  Administered 2012-03-30: 4 mg via INTRAVENOUS

## 2012-03-30 MED ORDER — CEFAZOLIN SODIUM-DEXTROSE 2-3 GM-% IV SOLR
INTRAVENOUS | Status: AC
  Filled 2012-03-30: qty 50

## 2012-03-30 MED ORDER — HYDROMORPHONE HCL PF 1 MG/ML IJ SOLN: 0.2500 mg | INTRAMUSCULAR | Status: DC | PRN

## 2012-03-30 MED ORDER — GLYCOPYRROLATE 0.2 MG/ML IJ SOLN
INTRAMUSCULAR | Status: DC | PRN
  Administered 2012-03-30: .6 mg via INTRAVENOUS

## 2012-03-30 MED ORDER — HYDROMORPHONE HCL PF 1 MG/ML IJ SOLN
2.0000 mg | INTRAMUSCULAR | Status: DC | PRN
Start: 2012-03-30 — End: 2012-03-31
  Administered 2012-03-30 (×2): 2 mg via INTRAVENOUS
  Filled 2012-03-30: qty 2
  Filled 2012-03-30: qty 1

## 2012-03-30 MED ORDER — SUCCINYLCHOLINE CHLORIDE 20 MG/ML IJ SOLN
INTRAMUSCULAR | Status: DC | PRN
  Administered 2012-03-30: 80 mg via INTRAVENOUS

## 2012-03-30 MED ORDER — CEFAZOLIN SODIUM-DEXTROSE 2-3 GM-% IV SOLR
2.0000 g | Freq: Once | INTRAVENOUS | Status: AC
  Administered 2012-03-30 (×2): 2 g via INTRAVENOUS

## 2012-03-30 MED ORDER — PROPOFOL 10 MG/ML IV EMUL
INTRAVENOUS | Status: DC | PRN
  Administered 2012-03-30: 160 mg via INTRAVENOUS

## 2012-03-30 MED ORDER — SPIRONOLACTONE 100 MG PO TABS
100.0000 mg | ORAL_TABLET | Freq: Every day | ORAL | Status: DC
  Administered 2012-03-31: 100 mg via ORAL
  Filled 2012-03-30: qty 1

## 2012-03-30 MED ORDER — HYDROCODONE-ACETAMINOPHEN 5-325 MG PO TABS
1.0000 | ORAL_TABLET | ORAL | Status: DC | PRN
  Administered 2012-03-30: 1 via ORAL
  Administered 2012-03-31 (×2): 2 via ORAL
  Filled 2012-03-30: qty 2
  Filled 2012-03-30: qty 1
  Filled 2012-03-30: qty 2

## 2012-03-30 MED ORDER — ONDANSETRON HCL 4 MG/2ML IJ SOLN: 4.0000 mg | Freq: Four times a day (QID) | INTRAMUSCULAR | Status: DC | PRN

## 2012-03-30 MED ORDER — 0.9 % SODIUM CHLORIDE (POUR BTL) OPTIME
TOPICAL | Status: DC | PRN
  Administered 2012-03-30: 1000 mL

## 2012-03-30 MED ORDER — BUPIVACAINE HCL (PF) 0.5 % IJ SOLN
INTRAMUSCULAR | Status: DC | PRN
  Administered 2012-03-30: 20 mL

## 2012-03-30 MED ORDER — LIDOCAINE HCL (CARDIAC) 20 MG/ML IV SOLN
INTRAVENOUS | Status: DC | PRN
  Administered 2012-03-30: 75 mg via INTRAVENOUS

## 2012-03-30 MED ORDER — LACTATED RINGERS IR SOLN
Status: DC | PRN
  Administered 2012-03-30: 1

## 2012-03-30 MED ORDER — NEOSTIGMINE METHYLSULFATE 1 MG/ML IJ SOLN
INTRAMUSCULAR | Status: DC | PRN
  Administered 2012-03-30: 4 mg via INTRAVENOUS

## 2012-03-30 MED ORDER — SODIUM CHLORIDE 0.9 % IV SOLN
INTRAVENOUS | Status: DC | PRN
  Administered 2012-03-30: 09:00:00 via INTRAVENOUS

## 2012-03-30 MED ORDER — LACTATED RINGERS IV SOLN
INTRAVENOUS | Status: DC | PRN
  Administered 2012-03-30 (×2): via INTRAVENOUS

## 2012-03-30 MED ORDER — ONDANSETRON HCL 4 MG PO TABS: 4.0000 mg | ORAL_TABLET | Freq: Four times a day (QID) | ORAL | Status: DC | PRN

## 2012-03-30 MED ORDER — BUMETANIDE 2 MG PO TABS
2.0000 mg | ORAL_TABLET | Freq: Every day | ORAL | Status: DC
  Administered 2012-03-30 – 2012-03-31 (×2): 2 mg via ORAL
  Filled 2012-03-30 (×2): qty 1

## 2012-03-30 MED ORDER — SUFENTANIL CITRATE 50 MCG/ML IV SOLN
INTRAVENOUS | Status: DC | PRN
  Administered 2012-03-30 (×3): 10 ug via INTRAVENOUS
  Administered 2012-03-30: 20 ug via INTRAVENOUS

## 2012-03-30 MED ORDER — PROMETHAZINE HCL 25 MG/ML IJ SOLN
INTRAMUSCULAR | Status: AC
  Administered 2012-03-30: 23:00:00
  Filled 2012-03-30: qty 1

## 2012-03-30 MED ORDER — CISATRACURIUM BESYLATE 2 MG/ML IV SOLN
INTRAVENOUS | Status: DC | PRN
  Administered 2012-03-30: 8 mg via INTRAVENOUS
  Administered 2012-03-30: 4 mg via INTRAVENOUS

## 2012-03-30 MED ORDER — PROMETHAZINE HCL 25 MG/ML IJ SOLN
6.2500 mg | INTRAMUSCULAR | Status: DC | PRN
  Administered 2012-03-30: 6.25 mg via INTRAVENOUS

## 2012-03-30 MED ORDER — FAMOTIDINE IN NACL 20-0.9 MG/50ML-% IV SOLN
20.0000 mg | Freq: Two times a day (BID) | INTRAVENOUS | Status: DC
  Administered 2012-03-30 – 2012-03-31 (×3): 20 mg via INTRAVENOUS
  Filled 2012-03-30 (×5): qty 50

## 2012-03-30 MED ORDER — BUPIVACAINE HCL (PF) 0.5 % IJ SOLN
INTRAMUSCULAR | Status: AC
  Filled 2012-03-30: qty 30

## 2012-03-30 MED ORDER — CEFAZOLIN SODIUM 1-5 GM-% IV SOLN
1.0000 g | Freq: Four times a day (QID) | INTRAVENOUS | Status: AC
  Administered 2012-03-30 – 2012-03-31 (×3): 1 g via INTRAVENOUS
  Filled 2012-03-30 (×4): qty 50

## 2012-03-30 SURGICAL SUPPLY — 50 items
APL SKNCLS STERI-STRIP NONHPOA (GAUZE/BANDAGES/DRESSINGS) ×1
BANDAGE ADHESIVE 1X3 (GAUZE/BANDAGES/DRESSINGS) ×4 IMPLANT
BENZOIN TINCTURE PRP APPL 2/3 (GAUZE/BANDAGES/DRESSINGS) ×2 IMPLANT
BINDER ABD UNIV 12 45-62 (WOUND CARE) IMPLANT
BINDER ABDOMINAL 46IN 62IN (WOUND CARE) ×2
CANISTER SUCTION 2500CC (MISCELLANEOUS) ×2 IMPLANT
CANNULA ENDOPATH XCEL 11M (ENDOMECHANICALS) ×1 IMPLANT
CHLORAPREP W/TINT 26ML (MISCELLANEOUS) ×1 IMPLANT
CLOTH BEACON ORANGE TIMEOUT ST (SAFETY) ×2 IMPLANT
DECANTER SPIKE VIAL GLASS SM (MISCELLANEOUS) ×2 IMPLANT
DEVICE SECURE STRAP 25 ABSORB (INSTRUMENTS) ×3 IMPLANT
DEVICE TROCAR PUNCTURE CLOSURE (ENDOMECHANICALS) ×2 IMPLANT
DISSECTOR BLUNT TIP ENDO 5MM (MISCELLANEOUS) IMPLANT
DRAPE LAPAROSCOPIC ABDOMINAL (DRAPES) ×2 IMPLANT
DRSG TEGADERM 2-3/8X2-3/4 SM (GAUZE/BANDAGES/DRESSINGS) IMPLANT
DRSG TEGADERM 4X4.75 (GAUZE/BANDAGES/DRESSINGS) ×1 IMPLANT
ELECT REM PT RETURN 9FT ADLT (ELECTROSURGICAL) ×2
ELECTRODE REM PT RTRN 9FT ADLT (ELECTROSURGICAL) ×1 IMPLANT
GAUZE XEROFORM 2X2 STRL (GAUZE/BANDAGES/DRESSINGS) ×1 IMPLANT
GLOVE BIOGEL PI IND STRL 7.0 (GLOVE) ×1 IMPLANT
GLOVE BIOGEL PI INDICATOR 7.0 (GLOVE) ×1
GLOVE SURG SIGNA 7.5 PF LTX (GLOVE) ×3 IMPLANT
GOWN STRL NON-REIN LRG LVL3 (GOWN DISPOSABLE) ×1 IMPLANT
GOWN STRL REIN XL XLG (GOWN DISPOSABLE) ×5 IMPLANT
HAND ACTIVATED (MISCELLANEOUS) ×1 IMPLANT
KIT BASIN OR (CUSTOM PROCEDURE TRAY) ×2 IMPLANT
NDL INSUFFLATION 14GA 120MM (NEEDLE) IMPLANT
NDL SPNL 22GX3.5 QUINCKE BK (NEEDLE) ×1 IMPLANT
NEEDLE INSUFFLATION 14GA 120MM (NEEDLE) IMPLANT
NEEDLE SPNL 22GX3.5 QUINCKE BK (NEEDLE) ×2 IMPLANT
NS IRRIG 1000ML POUR BTL (IV SOLUTION) ×2 IMPLANT
PEN SKIN MARKING BROAD (MISCELLANEOUS) ×2 IMPLANT
PENCIL BUTTON HOLSTER BLD 10FT (ELECTRODE) IMPLANT
SCISSORS LAP 5X35 DISP (ENDOMECHANICALS) ×1 IMPLANT
SET IRRIG TUBING LAPAROSCOPIC (IRRIGATION / IRRIGATOR) ×1 IMPLANT
SOLUTION ANTI FOG 6CC (MISCELLANEOUS) ×2 IMPLANT
SPONGE GAUZE 4X4 12PLY (GAUZE/BANDAGES/DRESSINGS) ×1 IMPLANT
STRIP CLOSURE SKIN 1/2X4 (GAUZE/BANDAGES/DRESSINGS) ×2 IMPLANT
SUT MNCRL AB 4-0 PS2 18 (SUTURE) IMPLANT
SUT NOVA 0 T19/GS 22DT (SUTURE) ×2 IMPLANT
TACKER 5MM HERNIA 3.5CML NAB (ENDOMECHANICALS) IMPLANT
TOWEL OR 17X26 10 PK STRL BLUE (TOWEL DISPOSABLE) ×3 IMPLANT
TRAY FOLEY CATH 14FRSI W/METER (CATHETERS) ×2 IMPLANT
TRAY LAP CHOLE (CUSTOM PROCEDURE TRAY) ×2 IMPLANT
TROCAR BLADELESS 15MM (ENDOMECHANICALS) ×1 IMPLANT
TROCAR BLADELESS OPT 5 75 (ENDOMECHANICALS) ×2 IMPLANT
TROCAR XCEL BLUNT TIP 100MML (ENDOMECHANICALS) IMPLANT
TROCAR XCEL NON-BLD 11X100MML (ENDOMECHANICALS) ×2 IMPLANT
TUBING INSUFFLATION 10FT LAP (TUBING) ×2 IMPLANT
ventralight ×1 IMPLANT

## 2012-03-30 NOTE — Interval H&P Note (Signed)
History and Physical Interval Note:  He has had no change in his history or exam  03/30/2012 6:31 AM  [name]  has presented today for surgery, with the diagnosis of incisional hernia  The various methods of treatment have been discussed with the patient and family. After consideration of risks, benefits and other options for treatment, the patient has consented to  Procedure(s) (LRB): LAPAROSCOPIC INCISIONAL HERNIA (N/Medina) INSERTION OF MESH (N/Medina) as Medina surgical intervention .  The patients' history has been reviewed, patient examined, no change in status, stable for surgery.  I have reviewed the patients' chart and labs.  Questions were answered to the patient's satisfaction.     Noah Medina

## 2012-03-30 NOTE — Anesthesia Procedure Notes (Signed)
Procedure Name: Intubation Date/Time: 03/30/2012 8:33 AM Performed by: Leroy Libman L Patient Re-evaluated:Patient Re-evaluated prior to inductionOxygen Delivery Method: Circle system utilized Preoxygenation: Pre-oxygenation with 100% oxygen Intubation Type: IV induction, Cricoid Pressure applied and Rapid sequence Laryngoscope Size: Miller and 3 Grade View: Grade I Tube type: Oral Tube size: 8.0 mm Number of attempts: 1 Airway Equipment and Method: Stylet Placement Confirmation: ETT inserted through vocal cords under direct vision,  breath sounds checked- equal and bilateral and positive ETCO2 Secured at: 21 cm Tube secured with: Tape Dental Injury: Teeth and Oropharynx as per pre-operative assessment

## 2012-03-30 NOTE — Anesthesia Preprocedure Evaluation (Addendum)
Anesthesia Evaluation  Patient identified by MRN, date of birth, ID band Patient awake    Reviewed: Allergy & Precautions, H&P , NPO status , Patient's Chart, lab work & pertinent test results  Airway Mallampati: II TM Distance: >3 FB Neck ROM: Full    Dental No notable dental hx.    Pulmonary Current Smoker,  breath sounds clear to auscultation  Pulmonary exam normal       Cardiovascular Rhythm:Regular Rate:Normal  Thrombocytopenia severe   Neuro/Psych negative neurological ROS  negative psych ROS   GI/Hepatic negative GI ROS, GERD-  Medicated,(+) Cirrhosis -       , Hepatitis -, CESLD  PT and INR increased   Endo/Other  negative endocrine ROS  Renal/GU negative Renal ROS  negative genitourinary   Musculoskeletal negative musculoskeletal ROS (+)   Abdominal   Peds negative pediatric ROS (+)  Hematology negative hematology ROS (+)   Anesthesia Other Findings   Reproductive/Obstetrics negative OB ROS                          Anesthesia Physical Anesthesia Plan  ASA: III  Anesthesia Plan: General   Post-op Pain Management:    Induction: Intravenous  Airway Management Planned: Oral ETT  Additional Equipment:   Intra-op Plan:   Post-operative Plan: Extubation in OR  Informed Consent: I have reviewed the patients History and Physical, chart, labs and discussed the procedure including the risks, benefits and alternatives for the proposed anesthesia with the patient or authorized representative who has indicated his/her understanding and acceptance.   Dental advisory given  Plan Discussed with: CRNA  Anesthesia Plan Comments:         Anesthesia Quick Evaluation

## 2012-03-30 NOTE — Transfer of Care (Signed)
Immediate Anesthesia Transfer of Care Note  Patient: Noah Medina  Procedure(s) Performed: Procedure(s) (LRB): LAPAROSCOPIC INCISIONAL HERNIA (N/A) INSERTION OF MESH (N/A)  Patient Location: PACU  Anesthesia Type: General  Level of Consciousness: awake, alert  and oriented  Airway & Oxygen Therapy: Patient Spontanous Breathing and Patient connected to face mask oxygen  Post-op Assessment: Report given to PACU RN and Post -op Vital signs reviewed and stable  Post vital signs: Reviewed and stable  Complications: No apparent anesthesia complications

## 2012-03-30 NOTE — Op Note (Signed)
LAPAROSCOPIC INCISIONAL HERNIA, INSERTION OF MESH  Procedure Note  Noah Medina 03/30/2012   Pre-op Diagnosis:  Recurrent incisional hernia     Post-op Diagnosis:   Procedure(s): LAPAROSCOPIC RECURRENT  INCISIONAL HERNIA REPAIR WITH  INSERTION OF MESH (25 X 33 CM)  Surgeon(s): Shelly Rubenstein, MD  Anesthesia: General  Staff:  Therese Sarah, RN - Circulator Misty Clifton Custard, CST - Scrub Person Traci Lynn Pozil, CST - Scrub Person  Estimated Blood Loss: 250 CC               Indications: This is a 56 year old gentleman with cirrhosis and ascites as well as a recurrent ventral incisional hernia. The decision has been made to proceed with repair with mesh  Procedure: The patient was brought to the operating room and identified as the correct patient. He was placed supine on the operating table and general anesthesia was induced. His abdomen was then prepped and draped in the usual sterile fashion. A small incision was then made in the patient's left upper quadrant with a scalpel. Using the 5 mm trocar an Optiview scope, I was able to get through all the layers of the abdominal wall and into the perineal cavity under direct vision. Insufflation of the abdomen was then begun. I evaluated the entrance site and saw no evidence of bowel injury. I placed another 5 mm port in the patient's left lower quadrant under vision. I then placed a 5 mm port his right lower quadrant an 11 mm port the right upper quadrant under direct vision. This had a very large hernia defect. I was able to take down the omentum stuck to the abdominal wall with the harmonic scalpel. The patient had a moderate amount of intra-abdominal ascites. The hernia defect was again very large. Omentum and colon were stuck to the upper abdomen but I was able to separate it from the abdominal wall safely. Again, no evidence of bowel injury was identified. Next a 25 x 33 cm piece of mesh was brought to the field. This was a Bard  patch. I placed 4 separate 0 Novafil sutures and to the edges of the mesh. I had to enlarged the 11 mm trocar site a 15 mm trocar site. I was then able to roll the mesh inserted through the 15 mm trocar. I then unrolled the mesh under direct vision. I made 4 separate skin incisions and then using the suture passer was able to pull the sutures up to the abdominal wall and through. These were then tied in place securing the mesh to the peritoneal surface. Using the absorbable tacker, I was able to tack the mesh in circumferentially to normal fascia. Wide coverage of the fascial defect appeared to be achieved in all areas. I then irrigated the abdomen normal saline. I suctioned out a large amount of ascites. Hemostasis appeared to be achieved. All ports were removed and the great vision and the abdomen was deflated. I placed another figure-of-eight 0 Novafil suture at the fascia of the 15 mm trocar site. I then closed all incisions with 4-0 Monocryl sutures and staples to try to avoid ascites leak. The patient tolerated the procedure well. All the counts were correct at the end of the procedure. The patient was then extubated in the operating room and taken in a stable condition to the recovery room.          Noah Medina A   Date: 03/30/2012  Time: 10:16 AM

## 2012-03-30 NOTE — H&P (Signed)
Noah Medina is an 56 y.o. male.   Chief Complaint: incisional hernia HPI: this is a gentleman with a symptomatic ventral incisional hernia. He had a primary repair last year. Because of a large wound on his abdominal wall, mesh could not be placed. He has significant liver disease and has intermittent significant ascites which is contributed to his recurrence. He currently has no obstructive symptoms.  Past Medical History  Diagnosis Date  . Cirrhosis of liver   . Hepatitis C 03-28-12    ? 80's-prev. IV drug abuse  . 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 Blood transfusion 03-28-12    '81-hx. of bleeding ulcer  . History of bleeding ulcers 03-28-12    Hx. '81  . GERD (gastroesophageal reflux disease) 03-26-12    Hx. reflux. ulcers in past, occ. OTC med    Past Surgical History  Procedure Date  . Umbilical hernia repair   . Hernia repair 08/24/11    ventral hernia with mesh  . Hernia repair 11'11    Umbilical  . Peptic ulcer 03-26-12    open peptic ulcer surgery repair- '81    Family History  Problem Relation Age of Onset  . Cancer Mother     breast  . Cancer Father     brain  . Heart disease Brother   . Cancer Sister     breast  . Cancer Sister     breast   Social History:  reports that he has been smoking.  He uses smokeless tobacco. He reports that he drinks alcohol. He reports that he uses illicit drugs.  Allergies: No Known Allergies  No current facility-administered medications on file as of 03/30/2012.   Medications Prior to Admission  Medication Sig Dispense Refill  . spironolactone (ALDACTONE) 100 MG tablet Take 100 mg by mouth daily before breakfast.         Results for orders placed during the hospital encounter of 03/28/12 (from the past 48 hour(s))  SURGICAL PCR SCREEN     Status: Abnormal   Collection Time   03/28/12 10:25 AM      Component Value Range Comment   MRSA, PCR POSITIVE (*) NEGATIVE     Staphylococcus aureus  POSITIVE (*) NEGATIVE    APTT     Status: Normal   Collection Time   03/28/12 10:45 AM      Component Value Range Comment   aPTT 34  24 - 37 (seconds)   CBC     Status: Abnormal   Collection Time   03/28/12 10:45 AM      Component Value Range Comment   WBC 3.3 (*) 4.0 - 10.5 (K/uL)    RBC 3.58 (*) 4.22 - 5.81 (MIL/uL)    Hemoglobin 11.8 (*) 13.0 - 17.0 (g/dL)    HCT 40.9 (*) 81.1 - 52.0 (%)    MCV 95.5  78.0 - 100.0 (fL)    MCH 33.0  26.0 - 34.0 (pg)    MCHC 34.5  30.0 - 36.0 (g/dL)    RDW 91.4 (*) 78.2 - 15.5 (%)    Platelets 60 (*) 150 - 400 (K/uL)   DIFFERENTIAL     Status: Abnormal   Collection Time   03/28/12 10:45 AM      Component Value Range Comment   Neutrophils Relative 73  43 - 77 (%)    Neutro Abs 2.4  1.7 - 7.7 (K/uL)    Lymphocytes Relative 19  12 - 46 (%)  Lymphs Abs 0.6 (*) 0.7 - 4.0 (K/uL)    Monocytes Relative 7  3 - 12 (%)    Monocytes Absolute 0.2  0.1 - 1.0 (K/uL)    Eosinophils Relative 1  0 - 5 (%)    Eosinophils Absolute 0.0  0.0 - 0.7 (K/uL)    Basophils Relative 0  0 - 1 (%)    Basophils Absolute 0.0  0.0 - 0.1 (K/uL)   PROTIME-INR     Status: Abnormal   Collection Time   03/28/12 10:45 AM      Component Value Range Comment   Prothrombin Time 17.9 (*) 11.6 - 15.2 (seconds)    INR 1.45  0.00 - 1.49     No results found.  Review of Systems  Endo/Heme/Allergies: Does not bruise/bleed easily.  All other systems reviewed and are negative.    Blood pressure 139/79, pulse 88, temperature 96.6 F (35.9 C), temperature source Oral, resp. rate 18, SpO2 98.00%. Physical Exam  Constitutional: He is oriented to person, place, and time. He appears well-developed and well-nourished. No distress.  HENT:  Head: Normocephalic and atraumatic.  Right Ear: External ear normal.  Left Ear: External ear normal.  Nose: Nose normal.  Mouth/Throat: Oropharynx is clear and moist.  Eyes: Conjunctivae are normal. Pupils are equal, round, and reactive to light. No  scleral icterus.  Neck: Normal range of motion. Neck supple. No tracheal deviation present.  Cardiovascular: Normal rate, regular rhythm, normal heart sounds and intact distal pulses.   No murmur heard. Respiratory: Effort normal and breath sounds normal. No respiratory distress.  GI: Soft. Bowel sounds are normal.       Large ventral incisional hernia. Some mild skin breakdown  Musculoskeletal: Normal range of motion. He exhibits no edema and no tenderness.  Lymphadenopathy:    He has no cervical adenopathy.  Neurological: He is alert and oriented to person, place, and time.  Skin: Skin is warm and dry. No rash noted. No erythema.  Psychiatric: His behavior is normal. Judgment normal.     Assessment/Plan Ventral incisional hernia  Plan will be to proceed with laparoscopic repair of the hernia with mesh. He understands he is high risk given his liver disease and ascites. I discussed the risks in detail which include were not limited to bleeding, infection, need to convert to an open procedure, recurrence, need to remove any open skin, drain placement, ascites leak, cardiopulmonary problems, worsening of his liver failure, and even death. He understands and wishes to proceed. Likelihood of success is moderate.  Leveda Kendrix A 03/30/2012, 6:28 AM

## 2012-03-30 NOTE — Preoperative (Signed)
Beta Blockers   Reason not to administer Beta Blockers:Not Applicable 

## 2012-03-30 NOTE — Anesthesia Postprocedure Evaluation (Signed)
  Anesthesia Post-op Note  Patient: Noah Medina  Procedure(s) Performed: Procedure(s) (LRB): LAPAROSCOPIC INCISIONAL HERNIA (N/A) INSERTION OF MESH (N/A)  Patient Location: PACU  Anesthesia Type: General  Level of Consciousness: awake and alert   Airway and Oxygen Therapy: Patient Spontanous Breathing  Post-op Pain: mild  Post-op Assessment: Post-op Vital signs reviewed, Patient's Cardiovascular Status Stable, Respiratory Function Stable, Patent Airway and No signs of Nausea or vomiting  Post-op Vital Signs: stable  Complications: No apparent anesthesia complications

## 2012-03-31 LAB — PROTIME-INR
INR: 1.5 — ABNORMAL HIGH (ref 0.00–1.49)
Prothrombin Time: 18.4 seconds — ABNORMAL HIGH (ref 11.6–15.2)

## 2012-03-31 LAB — COMPREHENSIVE METABOLIC PANEL
AST: 43 U/L — ABNORMAL HIGH (ref 0–37)
Albumin: 2.5 g/dL — ABNORMAL LOW (ref 3.5–5.2)
Calcium: 8 mg/dL — ABNORMAL LOW (ref 8.4–10.5)
Creatinine, Ser: 1.38 mg/dL — ABNORMAL HIGH (ref 0.50–1.35)
Total Protein: 6.1 g/dL (ref 6.0–8.3)

## 2012-03-31 LAB — PREPARE PLATELET PHERESIS: Unit division: 0

## 2012-03-31 MED ORDER — HYDROMORPHONE HCL 4 MG PO TABS: 2.0000 mg | ORAL_TABLET | ORAL | Status: AC | PRN

## 2012-03-31 MED ORDER — MUPIROCIN 2 % EX OINT
1.0000 "application " | TOPICAL_OINTMENT | Freq: Two times a day (BID) | CUTANEOUS | Status: DC
  Administered 2012-03-31: 1 via NASAL

## 2012-03-31 MED ORDER — CHLORHEXIDINE GLUCONATE CLOTH 2 % EX PADS
6.0000 | MEDICATED_PAD | Freq: Every day | CUTANEOUS | Status: DC
  Administered 2012-03-31: 6 via TOPICAL

## 2012-03-31 MED ORDER — DOXYCYCLINE HYCLATE 100 MG PO TABS: 100.0000 mg | ORAL_TABLET | Freq: Two times a day (BID) | ORAL | Status: AC

## 2012-03-31 NOTE — Progress Notes (Signed)
Patient ID: Noah Medina, male   DOB: 02/09/1956, 56 y.o.   MRN: [id number] POD#1  Doing very well VSS Abdomen soft, NT  Plan, discharge

## 2012-03-31 NOTE — Discharge Instructions (Signed)
[facility] ______[facility] °LAPAROSCOPIC SURGERY: POST OP INSTRUCTIONS °Always review your discharge instruction sheet given to you by the facility where your surgery was performed. °IF YOU HAVE DISABILITY OR FAMILY LEAVE FORMS, YOU MUST BRING THEM TO THE OFFICE FOR PROCESSING.   °DO NOT GIVE THEM TO YOUR DOCTOR. ° °1. A prescription for pain medication may be given to you upon discharge.  Take your pain medication as prescribed, if needed.  If narcotic pain medicine is not needed, then you may take acetaminophen (Tylenol) or ibuprofen (Advil) as needed. °2. Take your usually prescribed medications unless otherwise directed. °3. If you need a refill on your pain medication, please contact your pharmacy.  They will contact our office to request authorization. Prescriptions will not be filled after 5pm or on week-ends. °4. You should follow a light diet the first few days after arrival home, such as soup and crackers, etc.  Be sure to include lots of fluids daily. °5. Most patients will experience some swelling and bruising in the area of the incisions.  Ice packs will help.  Swelling and bruising can take several days to resolve.  °6. It is common to experience some constipation if taking pain medication after surgery.  Increasing fluid intake and taking a stool softener (such as Colace) will usually help or prevent this problem from occurring.  A mild laxative (Milk of Magnesia or Miralax) should be taken according to package instructions if there are no bowel movements after 48 hours. °7. Unless discharge instructions indicate otherwise, you may remove your bandages 24-48 hours after surgery, and you may shower at that time.  You may have steri-strips (small skin tapes) in place directly over the incision.  These strips should be left on the skin for 7-10 days.  If your surgeon used skin glue on the incision, you may shower in 24 hours.  The glue will flake off over the next 2-3 weeks.  Any sutures or  staples will be removed at the office during your follow-up visit. °8. ACTIVITIES:  You may resume regular (light) daily activities beginning the next day--such as daily self-care, walking, climbing stairs--gradually increasing activities as tolerated.  You may have sexual intercourse when it is comfortable.  Refrain from any heavy lifting or straining until approved by your doctor. °a. You may drive when you are no longer taking prescription pain medication, you can comfortably wear a seatbelt, and you can safely maneuver your car and apply brakes. °b. RETURN TO WORK:  __________________________________________________________ °9. You should see your doctor in the office for a follow-up appointment approximately 2-3 weeks after your surgery.  Make sure that you call for this appointment within a day or two after you arrive home to insure a convenient appointment time. °10. OTHER INSTRUCTIONS: __________________________________________________________________________________________________________________________ __________________________________________________________________________________________________________________________ °WHEN TO CALL YOUR DOCTOR: °1. Fever over 101.0 °2. Inability to urinate °3. Continued bleeding from incision. °4. Increased pain, redness, or drainage from the incision. °5. Increasing abdominal pain ° °The clinic staff is available to answer your questions during regular business hours.  Please don’t hesitate to call and ask to speak to one of the nurses for clinical concerns.  If you have a medical emergency, go to the nearest emergency room or call 911.  A surgeon from [facility] is always on call at the hospital. °[address], [address], [place], [place]  [address] ? [address] [address], , [place]   [address] °[phone] ? [phone] ? FAX [phone] °Web site:   [email or web address] °

## 2012-03-31 NOTE — Discharge Summary (Signed)
Physician Discharge Summary  Patient ID: CAPERS HAGMANN MRN: [id number] DOB/AGE: 57-Oct-1957 57 y.o.  Admit date: 03/30/2012 Discharge date: 03/31/2012  Admission Diagnoses: recurrent ventral incisional hernia, cirrhosis  Discharge Diagnoses: same Active Problems:  * No active hospital problems. *    Discharged Condition: good  Hospital Course: admitted post op.  Did well.   No signs of bleeding.  Discharged POD#1  Consults: None  Significant Diagnostic Studies:   Treatments: surgery: lap incisional hernia repair with mesh  Discharge Exam: Blood pressure 116/67, pulse 90, temperature 98.1 F (36.7 C), temperature source Oral, resp. rate 18, height 5\' 11"  (1.803 m), weight 187 lb 2.7 oz (84.9 kg), SpO2 97.00%. General appearance: alert and no distress Abdomen soft, minimally tender  Disposition: 01-Home or Self Care  Discharge Orders    Future Appointments: Provider: Department: Dept Phone: Center:   04/21/2012 10:10 AM Shelly Rubenstein, MD [facility] [phone] None     Medication List  As of 03/31/2012  9:44 AM   TAKE these medications         bumetanide 2 MG tablet   Commonly known as: BUMEX   Take 2 mg by mouth daily.      doxycycline 100 MG capsule   Commonly known as: VIBRAMYCIN   Take 100 mg by mouth 2 (two) times daily.      doxycycline 100 MG tablet   Commonly known as: VIBRA-TABS   Take 1 tablet (100 mg total) by mouth 2 (two) times daily.      HYDROmorphone 4 MG tablet   Commonly known as: DILAUDID   Take 0.5-2 tablets (2-8 mg total) by mouth every 4 (four) hours as needed for pain.      mulitivitamin with minerals Tabs   Take 1 tablet by mouth daily.      naproxen sodium 220 MG tablet   Commonly known as: ANAPROX   Take 440 mg by mouth 2 (two) times daily as needed. Pain      omeprazole 20 MG capsule   Commonly known as: PRILOSEC   Take 20 mg by mouth daily.      spironolactone 100 MG tablet   Commonly known as: ALDACTONE   Take 100  mg by mouth daily before breakfast.           Follow-up Information    Follow up with [facility] A, MD. Call in 2 weeks. [phone])    Contact information:   [facility] [address], [address] [place] [address] [phone]          Signed: Shelly Rubenstein 03/31/2012, 9:44 AM

## 2012-04-06 ENCOUNTER — Encounter (INDEPENDENT_AMBULATORY_CARE_PROVIDER_SITE_OTHER): Payer: Self-pay | Admitting: General Surgery

## 2012-04-11 ENCOUNTER — Encounter (HOSPITAL_COMMUNITY): Payer: Self-pay | Admitting: Surgery

## 2012-04-14 ENCOUNTER — Ambulatory Visit (INDEPENDENT_AMBULATORY_CARE_PROVIDER_SITE_OTHER): Payer: Medicaid Other | Admitting: Surgery

## 2012-04-14 ENCOUNTER — Encounter (INDEPENDENT_AMBULATORY_CARE_PROVIDER_SITE_OTHER): Payer: Self-pay | Admitting: Surgery

## 2012-04-14 VITALS — BP 130/84 | HR 74 | Temp 97.3°F | Resp 16 | Ht 71.0 in | Wt 188.8 lb

## 2012-04-14 DIAGNOSIS — Z09 Encounter for follow-up examination after completed treatment for conditions other than malignant neoplasm: Secondary | ICD-10-CM

## 2012-04-14 NOTE — Progress Notes (Signed)
Subjective:     Patient ID: Noah Medina, male   DOB: [birth year], 56 y.o.   MRN: [id number]  HPI He is here for his first postoperative visit status post laparoscopic incisional hernia repair with mesh. He reports he is fairly distended with ascites  Review of Systems     Objective:   Physical Exam On exam, the repair is intact there is a large amount of fluid in the old hernia sac from his ascites. The small wound in the midline is healing well with some mild skin breakdown. His incisions were healing well and I removed his staples    Assessment:     Patient status post left-sided ventral hernia repair with mesh with a large amount of ascites in the hernia sac    Plan:     If he is unable to have his ascites controlled medically he may need a paracentesis to remove some of fluid. I will see him back in 3 weeks. I renewed his Dilaudid

## 2012-04-21 ENCOUNTER — Encounter (INDEPENDENT_AMBULATORY_CARE_PROVIDER_SITE_OTHER): Payer: Medicaid Other | Admitting: Surgery

## 2012-05-10 ENCOUNTER — Encounter (HOSPITAL_COMMUNITY): Payer: Self-pay | Admitting: Emergency Medicine

## 2012-05-10 ENCOUNTER — Inpatient Hospital Stay (HOSPITAL_COMMUNITY)
Admission: EM | Admit: 2012-05-10 | Discharge: 2012-05-15 | DRG: 863 | Disposition: A | Discharge status: Home / Self Care | Payer: Medicaid Other | Attending: Surgery | Admitting: Surgery

## 2012-05-10 DIAGNOSIS — E876 Hypokalemia: Secondary | ICD-10-CM | POA: Diagnosis present

## 2012-05-10 DIAGNOSIS — K7460 Unspecified cirrhosis of liver: Secondary | ICD-10-CM | POA: Diagnosis present

## 2012-05-10 DIAGNOSIS — B182 Chronic viral hepatitis C: Secondary | ICD-10-CM | POA: Diagnosis present

## 2012-05-10 DIAGNOSIS — L0291 Cutaneous abscess, unspecified: Secondary | ICD-10-CM

## 2012-05-10 DIAGNOSIS — L02219 Cutaneous abscess of trunk, unspecified: Secondary | ICD-10-CM | POA: Diagnosis present

## 2012-05-10 DIAGNOSIS — T8149XA Infection following a procedure, other surgical site, initial encounter: Secondary | ICD-10-CM

## 2012-05-10 DIAGNOSIS — L03319 Cellulitis of trunk, unspecified: Secondary | ICD-10-CM | POA: Diagnosis present

## 2012-05-10 DIAGNOSIS — T8140XA Infection following a procedure, unspecified, initial encounter: Principal | ICD-10-CM | POA: Diagnosis present

## 2012-05-10 DIAGNOSIS — Y838 Other surgical procedures as the cause of abnormal reaction of the patient, or of later complication, without mention of misadventure at the time of the procedure: Secondary | ICD-10-CM | POA: Diagnosis present

## 2012-05-10 LAB — URINALYSIS, ROUTINE W REFLEX MICROSCOPIC
Glucose, UA: NEGATIVE mg/dL
Hgb urine dipstick: NEGATIVE
Protein, ur: NEGATIVE mg/dL
pH: 7 (ref 5.0–8.0)

## 2012-05-10 LAB — CBC
Hemoglobin: 9.4 g/dL — ABNORMAL LOW (ref 13.0–17.0)
MCH: 29.9 pg (ref 26.0–34.0)
MCV: 92.7 fL (ref 78.0–100.0)
RBC: 3.14 MIL/uL — ABNORMAL LOW (ref 4.22–5.81)

## 2012-05-10 LAB — DIFFERENTIAL
Basophils Relative: 0 % (ref 0–1)
Eosinophils Relative: 5 % (ref 0–5)
Lymphs Abs: 0.8 10*3/uL (ref 0.7–4.0)
Monocytes Absolute: 0.4 10*3/uL (ref 0.1–1.0)

## 2012-05-10 LAB — BASIC METABOLIC PANEL
CO2: 24 mEq/L (ref 19–32)
Calcium: 8.5 mg/dL (ref 8.4–10.5)
Chloride: 107 mEq/L (ref 96–112)
Creatinine, Ser: 0.8 mg/dL (ref 0.50–1.35)
Glucose, Bld: 83 mg/dL (ref 70–99)
Sodium: 139 mEq/L (ref 135–145)

## 2012-05-10 MED ORDER — HYDROCODONE-ACETAMINOPHEN 5-325 MG PO TABS
1.0000 | ORAL_TABLET | ORAL | Status: DC | PRN
  Administered 2012-05-10: 2 via ORAL
  Filled 2012-05-10: qty 2

## 2012-05-10 MED ORDER — VANCOMYCIN HCL 1000 MG IV SOLR
1500.0000 mg | Freq: Two times a day (BID) | INTRAVENOUS | Status: DC
  Administered 2012-05-11 – 2012-05-12 (×3): 1500 mg via INTRAVENOUS
  Filled 2012-05-10 (×4): qty 1500

## 2012-05-10 MED ORDER — HYDROMORPHONE HCL PF 1 MG/ML IJ SOLN
1.0000 mg | Freq: Once | INTRAMUSCULAR | Status: AC
  Administered 2012-05-10: 1 mg via INTRAVENOUS
  Filled 2012-05-10: qty 1

## 2012-05-10 MED ORDER — KCL IN DEXTROSE-NACL 20-5-0.45 MEQ/L-%-% IV SOLN
INTRAVENOUS | Status: DC
  Administered 2012-05-10: 21:00:00 via INTRAVENOUS
  Administered 2012-05-15: 20 mL/h via INTRAVENOUS
  Filled 2012-05-10 (×3): qty 1000

## 2012-05-10 MED ORDER — CEFAZOLIN SODIUM 1-5 GM-% IV SOLN
1.0000 g | Freq: Once | INTRAVENOUS | Status: AC
  Administered 2012-05-10: 1 g via INTRAVENOUS
  Filled 2012-05-10 (×2): qty 50

## 2012-05-10 MED ORDER — ADULT MULTIVITAMIN W/MINERALS CH
1.0000 | ORAL_TABLET | Freq: Every day | ORAL | Status: DC
  Administered 2012-05-10 – 2012-05-14 (×5): 1 via ORAL
  Filled 2012-05-10 (×5): qty 1

## 2012-05-10 MED ORDER — MORPHINE SULFATE 2 MG/ML IJ SOLN
2.0000 mg | INTRAMUSCULAR | Status: DC | PRN
  Administered 2012-05-10 (×2): 2 mg via INTRAVENOUS
  Administered 2012-05-11 (×2): 4 mg via INTRAVENOUS
  Administered 2012-05-12: 2 mg via INTRAVENOUS
  Administered 2012-05-14: 4 mg via INTRAVENOUS
  Administered 2012-05-14 (×2): 2 mg via INTRAVENOUS
  Administered 2012-05-14 – 2012-05-15 (×2): 4 mg via INTRAVENOUS
  Filled 2012-05-10 (×2): qty 1
  Filled 2012-05-10 (×3): qty 2
  Filled 2012-05-10: qty 1
  Filled 2012-05-10: qty 2
  Filled 2012-05-10 (×2): qty 1
  Filled 2012-05-10: qty 2

## 2012-05-10 MED ORDER — BUMETANIDE 2 MG PO TABS
2.0000 mg | ORAL_TABLET | Freq: Every day | ORAL | Status: DC
  Administered 2012-05-10 – 2012-05-14 (×5): 2 mg via ORAL
  Filled 2012-05-10 (×6): qty 1

## 2012-05-10 MED ORDER — PANTOPRAZOLE SODIUM 40 MG IV SOLR
40.0000 mg | Freq: Every day | INTRAVENOUS | Status: DC
  Administered 2012-05-11 (×2): 40 mg via INTRAVENOUS
  Filled 2012-05-10 (×3): qty 40

## 2012-05-10 MED ORDER — ONDANSETRON HCL 4 MG/2ML IJ SOLN
4.0000 mg | Freq: Four times a day (QID) | INTRAMUSCULAR | Status: DC | PRN
  Administered 2012-05-11: 4 mg via INTRAVENOUS
  Filled 2012-05-10: qty 2

## 2012-05-10 MED ORDER — DEXTROSE 5 % IV SOLN
1.0000 g | INTRAVENOUS | Status: DC
  Administered 2012-05-10 – 2012-05-14 (×5): 1 g via INTRAVENOUS
  Filled 2012-05-10 (×5): qty 10

## 2012-05-10 MED ORDER — SPIRONOLACTONE 100 MG PO TABS
100.0000 mg | ORAL_TABLET | Freq: Every day | ORAL | Status: DC
  Administered 2012-05-11 – 2012-05-14 (×4): 100 mg via ORAL
  Filled 2012-05-10 (×6): qty 1

## 2012-05-10 MED ORDER — PNEUMOCOCCAL VAC POLYVALENT 25 MCG/0.5ML IJ INJ
0.5000 mL | INJECTION | INTRAMUSCULAR | Status: AC
  Administered 2012-05-11: 0.5 mL via INTRAMUSCULAR
  Filled 2012-05-10: qty 0.5

## 2012-05-10 MED ORDER — VANCOMYCIN HCL IN DEXTROSE 1-5 GM/200ML-% IV SOLN
1000.0000 mg | Freq: Once | INTRAVENOUS | Status: AC
  Administered 2012-05-10: 1000 mg via INTRAVENOUS
  Filled 2012-05-10: qty 200

## 2012-05-10 MED ORDER — SODIUM CHLORIDE 0.9 % IV SOLN
Freq: Once | INTRAVENOUS | Status: AC
  Administered 2012-05-10: 18:00:00 via INTRAVENOUS

## 2012-05-10 NOTE — ED Provider Notes (Signed)
History     CSN: [id number]  Arrival date & time 05/10/12  1621   First MD Initiated Contact with Patient 05/10/12 1652      Chief Complaint  Patient presents with  . Abscess    (Consider location/radiation/quality/duration/timing/severity/associated sxs/prior treatment) Patient is a 56 y.o. male presenting with abscess. The history is provided by the patient. No language interpreter was used.  Abscess  This is a recurrent problem. The current episode started today. The problem has been gradually worsening. The problem is moderate. The abscess is characterized by redness and draining. Pertinent negatives include no fever, no diarrhea and no vomiting. Recently, medical care has been given by a specialist.   Patient here with complaint of the cyst of his hernia repair. States he took a shower this morning and went down he was bleeding. Patient has a recurrent ventral hernia that was repaired on 418 by Dr. Magnus Ivan. He also has cirrhosis with ascites and the hernial sac. Cellulitis surrounding the area. Will check labs and call surgeon for concerns.  Past Medical History  Diagnosis Date  . Cirrhosis of liver   . Hepatitis C 03-28-12    ? 80's-prev. IV drug abuse  . 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 Blood transfusion 03-28-12    '81-hx. of bleeding ulcer  . History of bleeding ulcers 03-28-12    Hx. '81  . GERD (gastroesophageal reflux disease) 03-26-12    Hx. reflux. ulcers in past, occ. OTC med    Past Surgical History  Procedure Date  . Umbilical hernia repair   . Hernia repair 08/24/11    ventral hernia with mesh  . Hernia repair 11'11    Umbilical  . Peptic ulcer 03-26-12    open peptic ulcer surgery repair- '81  . Incisional hernia repair 03/26/2012  . Incisional hernia repair 03/30/2012    Procedure: LAPAROSCOPIC INCISIONAL HERNIA;  Surgeon: Shelly Rubenstein, MD;  Location: [facility];  Service: General;  Laterality: N/A;  Laparoscopic  Incisional/ventral Hernia Repair with Mesh    Family History  Problem Relation Age of Onset  . Cancer Mother     breast  . Cancer Father     brain  . Heart disease Brother   . Cancer Sister     breast  . Cancer Sister     breast    History  Substance Use Topics  . Smoking status: Current Everyday Smoker -- 0.2 packs/day for 38 years  . Smokeless tobacco: Current User  . Alcohol Use: Yes     past hx. recreational drinking- none in 16 yrs.      Review of Systems  Constitutional: Negative for fever.  Cardiovascular: Negative for chest pain.  Gastrointestinal: Positive for abdominal distention. Negative for nausea, vomiting and diarrhea.  Genitourinary: Negative for dysuria.  Skin: Negative for color change.       Abdominal pain/bleeding  Neurological: Negative for dizziness.    Allergies  Review of patient's allergies indicates no known allergies.  Home Medications   Current Outpatient Rx  Name Route Sig Dispense Refill  . BUMETANIDE 2 MG PO TABS Oral Take 2 mg by mouth daily.    Marland Kitchen DOXYCYCLINE HYCLATE 100 MG PO CAPS Oral Take 100 mg by mouth 2 (two) times daily.    . ADULT MULTIVITAMIN W/MINERALS CH Oral Take 1 tablet by mouth daily.    Marland Kitchen NAPROXEN SODIUM 220 MG PO TABS Oral Take 440 mg by mouth 2 (two) times daily as needed.  Pain    . OMEPRAZOLE 20 MG PO CPDR Oral Take 20 mg by mouth daily.    Marland Kitchen SPIRONOLACTONE 100 MG PO TABS Oral Take 100 mg by mouth daily before breakfast.       BP 139/64  Pulse 87  Temp(Src) 98.4 F (36.9 C) (Oral)  Resp 18  SpO2 100%  Physical Exam  Nursing note and vitals reviewed. Constitutional: He is oriented to person, place, and time. He appears well-developed and well-nourished.  HENT:  Head: Normocephalic.  Eyes: Conjunctivae and EOM are normal. Pupils are equal, round, and reactive to light.  Neck: Normal range of motion. Neck supple.  Cardiovascular: Normal rate, regular rhythm and normal heart sounds.   Pulmonary/Chest:  Effort normal and breath sounds normal. No respiratory distress.  Abdominal: Soft. Bowel sounds are normal. He exhibits distension. There is tenderness. There is no rebound and no guarding.       Recent ventral hernia surgical site dehissing with surrounding ascites and cellulitis.  Musculoskeletal: Normal range of motion.  Neurological: He is alert and oriented to person, place, and time.  Skin: Skin is warm and dry.  Psychiatric: He has a normal mood and affect.    ED Course  Procedures (including critical care time)   Labs Reviewed  CBC  DIFFERENTIAL  BASIC METABOLIC PANEL  URINALYSIS, ROUTINE W REFLEX MICROSCOPIC   No results found.   No diagnosis found.    MDM  Shared visit with Dr. Freida Busman. Patient will be seen in the ER by Dr. Dillard Essex for his dehissing of his ventral hernia repair. Patient was given Ancef and vancomycin the ER. Wbc normal.  Appears to have cellulitis /ascities/ eyrthema surrounding repair.     Labs Reviewed  CBC - Abnormal; Notable for the following:    RBC 3.14 (*)    Hemoglobin 9.4 (*)    HCT 29.1 (*)    RDW 16.5 (*)    All other components within normal limits  BASIC METABOLIC PANEL - Abnormal; Notable for the following:    Potassium 3.3 (*)    All other components within normal limits  DIFFERENTIAL  URINALYSIS, ROUTINE W REFLEX MICROSCOPIC          Remi Haggard, NP 05/10/12 1925

## 2012-05-10 NOTE — ED Provider Notes (Signed)
Medical screening examination/treatment/procedure(s) were conducted as a shared visit with non-physician practitioner(s) and myself.  I personally evaluated the patient during the encounter  Toy Baker, MD 05/10/12 2229

## 2012-05-10 NOTE — ED Notes (Signed)
Attempted to call report. Floor RN unable to accept report.  

## 2012-05-10 NOTE — Progress Notes (Signed)
ANTIBIOTIC CONSULT NOTE - INITIAL  Pharmacy Consult for Vancomycin Indication: Cellulitis  No Known Allergies  Patient Measurements: Height: 5' 10.87" (180 cm) (documented 04/14/12) Weight: 188 lb 11.4 oz (85.6 kg) (documented 04/14/12) IBW/kg (Calculated) : 74.99    Vital Signs: Temp: 98.4 F (36.9 C) (05/28 1635) Temp src: Oral (05/28 1635) BP: 139/64 mmHg (05/28 1635) Pulse Rate: 87  (05/28 1635)        Labs:  Basename 05/10/12 1705  WBC 4.5  HGB 9.4*  PLT 82*  LABCREA --  CREATININE 0.80   Estimated Creatinine Clearance: 109.4 ml/min (by C-G formula based on Cr of 0.8).    Microbiology: No results found for this or any previous visit (from the past 720 hour(s)).  Medical History: Past Medical History  Diagnosis Date  . Cirrhosis of liver   . Hepatitis C 03-28-12    ? 80's-prev. IV drug abuse  . 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 Blood transfusion 03-28-12    '81-hx. of bleeding ulcer  . History of bleeding ulcers 03-28-12    Hx. '81  . GERD (gastroesophageal reflux disease) 03-26-12    Hx. reflux. ulcers in past, occ. OTC med    Medications:  Scheduled:    . sodium chloride   Intravenous Once  . bumetanide  2 mg Oral Daily  .  ceFAZolin (ANCEF) IV  1 g Intravenous Once  . cefTRIAXone (ROCEPHIN)  IV  1 g Intravenous Q24H  .  HYDROmorphone (DILAUDID) injection  1 mg Intravenous Once  . mulitivitamin with minerals  1 tablet Oral Daily  . pantoprazole (PROTONIX) IV  40 mg Intravenous QHS  . pneumococcal 23 valent vaccine  0.5 mL Intramuscular Tomorrow-1000  . spironolactone  100 mg Oral QAC breakfast  . vancomycin  1,000 mg Intravenous Once   Infusions:    . dextrose 5 % and 0.45 % NaCl with KCl 20 mEq/L 20 mL/hr at 05/10/12 2032   PRN: HYDROcodone-acetaminophen, morphine injection, ondansetron Assessment: 56 yo M  6 weeks post lap repair of ventral hernia.  Has had bloody drainage from abdominal incision and  cellulitis. Goal of Therapy:  Vancomycin trough level 10-15 mcg/ml  Plan:   Begin Vancomycin 1500mg  IV q 12 hours. ( Had first dose of 1000mg  in ER)  Obtain Vancomycin trough level at steady state  Loletta Specter 05/10/2012,8:40 PM

## 2012-05-10 NOTE — ED Provider Notes (Signed)
Medical screening examination/treatment/procedure(s) were conducted as a shared visit with non-physician practitioner(s) and myself.  I personally evaluated the patient during the encounter    Toy Baker, MD 05/10/12 1843

## 2012-05-10 NOTE — ED Notes (Signed)
Pt states he had umbilical hernia repair April 17th 2013. States he got up to walk to BR and started having blood run down his abd. Pt staes he did not have open area to abd prior to toady.

## 2012-05-10 NOTE — H&P (Signed)
Noah Medina is an 56 y.o. male.   Chief Complaint: bloody drainage from abdominal incision HPI: Patient is approximately 6 weeks following laparoscopic repair of recurrent large ventral hernia by Dr. Magnus Ivan. He was seen in the office early postoperatively on May 2. At that time he appeared to be doing well. The patient does have a significant history of cirrhosis and ascites secondary to hepatitis C. He was noted to have significant recurrence of his ascites in the hernia sac on May 2 but otherwise was doing well.  He states that this evening he got up from his chair and noticed a fairly large amount of thin bloody drainage on his shorts and pants. He was concerned and presents to the emergency room. He has had some mild pain around the lower abdominal incision for about 24 hours but no severe pain. He denies fever or chills or nausea or vomiting. He states he has had some redness of his abdominal wall since surgery and has not really noticed that it is worse. He has been on diuretics since early postoperatively for his ascites and states that it seemed to be gradually decreasing in recent weeks.  Past Medical History  Diagnosis Date  . Cirrhosis of liver   . Hepatitis C 03-28-12    ? 80's-prev. IV drug abuse  . 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 Blood transfusion 03-28-12    '81-hx. of bleeding ulcer  . History of bleeding ulcers 03-28-12    Hx. '81  . GERD (gastroesophageal reflux disease) 03-26-12    Hx. reflux. ulcers in past, occ. OTC med    Past Surgical History  Procedure Date  . Umbilical hernia repair   . Hernia repair 08/24/11    ventral hernia with mesh  . Hernia repair 11'11    Umbilical  . Peptic ulcer 03-26-12    open peptic ulcer surgery repair- '81  . Incisional hernia repair 03/26/2012  . Incisional hernia repair 03/30/2012    Procedure: LAPAROSCOPIC INCISIONAL HERNIA;  Surgeon: Shelly Rubenstein, MD;  Location: [facility];  Service: General;   Laterality: N/A;  Laparoscopic Incisional/ventral Hernia Repair with Mesh    Family History  Problem Relation Age of Onset  . Cancer Mother     breast  . Cancer Father     brain  . Heart disease Brother   . Cancer Sister     breast  . Cancer Sister     breast   Social History:  reports that he has been smoking.  He uses smokeless tobacco. He reports that he drinks alcohol. He reports that he uses illicit drugs.  Allergies: No Known Allergies  Current Facility-Administered Medications  Medication Dose Route Frequency Provider Last Rate Last Dose  . 0.9 %  sodium chloride infusion   Intravenous Once Remi Haggard, NP 50 mL/hr at 05/10/12 1826    . ceFAZolin (ANCEF) IVPB 1 g/50 mL premix  1 g Intravenous Once Remi Haggard, NP   1 g at 05/10/12 1830  . HYDROmorphone (DILAUDID) injection 1 mg  1 mg Intravenous Once Remi Haggard, NP   1 mg at 05/10/12 1829  . vancomycin (VANCOCIN) IVPB 1000 mg/200 mL premix  1,000 mg Intravenous Once Toy Baker, MD   1,000 mg at 05/10/12 1933   Current Outpatient Prescriptions  Medication Sig Dispense Refill  . bumetanide (BUMEX) 2 MG tablet Take 2 mg by mouth daily.      Marland Kitchen doxycycline (VIBRAMYCIN) 100 MG capsule Take  100 mg by mouth 2 (two) times daily.      . Multiple Vitamin (MULITIVITAMIN WITH MINERALS) TABS Take 1 tablet by mouth daily.      . naproxen sodium (ANAPROX) 220 MG tablet Take 440 mg by mouth 2 (two) times daily as needed. Pain      . omeprazole (PRILOSEC) 20 MG capsule Take 20 mg by mouth daily.      Marland Kitchen spironolactone (ALDACTONE) 100 MG tablet Take 100 mg by mouth daily before breakfast.          Results for orders placed during the hospital encounter of 05/10/12 (from the past 48 hour(s))  CBC     Status: Abnormal   Collection Time   05/10/12  5:05 PM      Component Value Range Comment   WBC 4.5  4.0 - 10.5 (K/uL)    RBC 3.14 (*) 4.22 - 5.81 (MIL/uL)    Hemoglobin 9.4 (*) 13.0 - 17.0 (g/dL)    HCT 21.3 (*) 08.6 - 52.0 (%)     MCV 92.7  78.0 - 100.0 (fL)    MCH 29.9  26.0 - 34.0 (pg)    MCHC 32.3  30.0 - 36.0 (g/dL)    RDW 57.8 (*) 46.9 - 15.5 (%)    Platelets 82 (*) 150 - 400 (K/uL)   DIFFERENTIAL     Status: Normal   Collection Time   05/10/12  5:05 PM      Component Value Range Comment   Neutrophils Relative 70  43 - 77 (%)    Lymphocytes Relative 17  12 - 46 (%)    Monocytes Relative 8  3 - 12 (%)    Eosinophils Relative 5  0 - 5 (%)    Basophils Relative 0  0 - 1 (%)    Neutro Abs 3.1  1.7 - 7.7 (K/uL)    Lymphs Abs 0.8  0.7 - 4.0 (K/uL)    Monocytes Absolute 0.4  0.1 - 1.0 (K/uL)    Eosinophils Absolute 0.2  0.0 - 0.7 (K/uL)    Basophils Absolute 0.0  0.0 - 0.1 (K/uL)    Smear Review PLATELET COUNT CONFIRMED BY SMEAR   PLATELETS APPEAR ADEQUATE  BASIC METABOLIC PANEL     Status: Abnormal   Collection Time   05/10/12  5:05 PM      Component Value Range Comment   Sodium 139  135 - 145 (mEq/L)    Potassium 3.3 (*) 3.5 - 5.1 (mEq/L)    Chloride 107  96 - 112 (mEq/L)    CO2 24  19 - 32 (mEq/L)    Glucose, Bld 83  70 - 99 (mg/dL)    BUN 12  6 - 23 (mg/dL)    Creatinine, Ser 6.29  0.50 - 1.35 (mg/dL)    Calcium 8.5  8.4 - 10.5 (mg/dL)    GFR calc non Af Amer >90  >90 (mL/min)    GFR calc Af Amer >90  >90 (mL/min)   URINALYSIS, ROUTINE W REFLEX MICROSCOPIC     Status: Normal   Collection Time   05/10/12  6:11 PM      Component Value Range Comment   Color, Urine YELLOW  YELLOW     APPearance CLEAR  CLEAR     Specific Gravity, Urine 1.027  1.005 - 1.030     pH 7.0  5.0 - 8.0     Glucose, UA NEGATIVE  NEGATIVE (mg/dL)    Hgb urine dipstick NEGATIVE  NEGATIVE  Bilirubin Urine NEGATIVE  NEGATIVE     Ketones, ur NEGATIVE  NEGATIVE (mg/dL)    Protein, ur NEGATIVE  NEGATIVE (mg/dL)    Urobilinogen, UA 1.0  0.0 - 1.0 (mg/dL)    Nitrite NEGATIVE  NEGATIVE     Leukocytes, UA NEGATIVE  NEGATIVE  MICROSCOPIC NOT DONE ON URINES WITH NEGATIVE PROTEIN, BLOOD, LEUKOCYTES, NITRITE, OR GLUCOSE <1000 mg/dL.     No results found.  Review of Systems  Constitutional: Negative for fever, chills, weight loss and malaise/fatigue.  HENT: Positive for nosebleeds.   Respiratory: Negative for cough, hemoptysis, shortness of breath, wheezing and stridor.   Cardiovascular: Negative for chest pain and palpitations.  Gastrointestinal: Positive for abdominal pain. Negative for nausea, vomiting, diarrhea, constipation and blood in stool.  Genitourinary: Negative for dysuria, urgency and frequency.    Blood pressure 139/64, pulse 87, temperature 98.4 F (36.9 C), temperature source Oral, resp. rate 18, SpO2 100.00%. Physical Exam  General: Alert well-developed Caucasian male who does not appear in any distress Skin: Warm and dry, [place] abdomen HEENT: No palpable masses or thyromegaly. Sclera nonicteric. Oropharynx clear Lymph nodes: No cervical, subclavicular, or inguinal nodes palpable Lungs: Clear without wheezing or increased work of breathing Cardiovascular: Regular rate and rhythm without murmur. No JVD. Trace to 1+ lower extremity edema. Abdomen: Mildly to moderately distended with ascites and fluid wave. The areas and open wound transverse about 5 mm in the low midline with some thin bloody drainage. No purulent drainage. There is about 5 cm of thin skin adjacent to this with fluid beneath it. There is some ecchymosis around the incision for a couple of centimeters and there is a broad area of blanching erythema across the lower abdomen. With sit up or Valsalva maneuver there is a large area of fluid down the midline of the abdomen which feels likely to be on top of the mesh. Again there is no purulent drainage. Extremities: Significant only for trace lower extremity edema Neurologic: He is alert, fully oriented, without gross motor deficits. No asterixis. Assessment/Plan Status post repair of large ventral incisional hernia laparoscopically. He has chronic ascites secondary to cirrhosis and hepatitis C. He  now has an open trocar site in the lower abdomen bloody ascitic drainage and a fairly large area of erythema across the lower abdomen. There is no purulent drainage. He would appear at least to have some cellulitis of the abdominal wall and with his ascites is clearly at risk for infection of his prosthetic mesh.  The patient will be admitted and started on broad-spectrum IV antibiotics. Start local wound care. Continue current diuretic regimen. Consider paracentesis to try to improve wound healing.  Cambrey Lupi T 05/10/2012, 7:46 PM

## 2012-05-11 ENCOUNTER — Inpatient Hospital Stay (HOSPITAL_COMMUNITY): Payer: Medicaid Other

## 2012-05-11 LAB — CBC
HCT: 28.3 % — ABNORMAL LOW (ref 39.0–52.0)
MCH: 30.3 pg (ref 26.0–34.0)
MCHC: 32.9 g/dL (ref 30.0–36.0)
MCV: 92.2 fL (ref 78.0–100.0)
RDW: 16.3 % — ABNORMAL HIGH (ref 11.5–15.5)

## 2012-05-11 LAB — BASIC METABOLIC PANEL
BUN: 11 mg/dL (ref 6–23)
Chloride: 104 mEq/L (ref 96–112)
Creatinine, Ser: 0.8 mg/dL (ref 0.50–1.35)
GFR calc Af Amer: >90 mL/min (ref >90)
Glucose, Bld: 98 mg/dL (ref 70–99)

## 2012-05-11 LAB — PROTIME-INR: Prothrombin Time: 18.7 seconds — ABNORMAL HIGH (ref 11.6–15.2)

## 2012-05-11 MED ORDER — HYDROMORPHONE HCL 4 MG PO TABS
4.0000 mg | ORAL_TABLET | ORAL | Status: DC | PRN
  Administered 2012-05-11 – 2012-05-12 (×4): 4 mg via ORAL
  Filled 2012-05-11 (×5): qty 1

## 2012-05-11 MED ORDER — LACTULOSE 10 GM/15ML PO SOLN
10.0000 g | Freq: Every day | ORAL | Status: DC
  Administered 2012-05-11 – 2012-05-14 (×4): 10 g via ORAL
  Filled 2012-05-11 (×5): qty 15

## 2012-05-11 NOTE — Procedures (Addendum)
[place] abd Shows very little intra peritoneal ascites seen per Dr Grace Isaac Some complicated fluid collection noted in hernia sac  45 cc of clear yellow fluid obtained from hernia sac Using US guidance  Pt tolerated well Call to Dr Magnus Ivan for orders to send fluid GS and Cx per Dr Magnus Ivan

## 2012-05-11 NOTE — Progress Notes (Signed)
  Subjective: More comfortable  Objective: Vital signs in last 24 hours: Temp:  [97.6 F (36.4 C)-98.4 F (36.9 C)] 97.6 F (36.4 C) (05/29 0606) Pulse Rate:  [72-87] 76  (05/29 0606) Resp:  [16-18] 16  (05/29 0606) BP: (124-139)/(64-69) 124/69 mmHg (05/29 0606) SpO2:  [98 %-100 %] 98 % (05/29 0606) Weight:  [185 lb 6.5 oz (84.1 kg)-188 lb 11.4 oz (85.6 kg)] 185 lb 6.5 oz (84.1 kg) (05/28 2200) Last BM Date: 05/09/12  Intake/Output from previous day: 05/28 0701 - 05/29 0700 In: -  Out: 2950 [Urine:2750; Emesis/NG output:200] Intake/Output this shift:    Comfortable appearing Lungs clear Abdomen with large amount of ascites Wound with moderate cellulitis  Lab Results:   Basename 05/11/12 0344 05/10/12 1705  WBC 3.0* 4.5  HGB 9.3* 9.4*  HCT 28.3* 29.1*  PLT 76* 82*   BMET  Basename 05/11/12 0344 05/10/12 1705  NA 138 139  K 3.1* 3.3*  CL 104 107  CO2 25 24  GLUCOSE 98 83  BUN 11 12  CREATININE 0.80 0.80  CALCIUM 8.3* 8.5   PT/INR  Basename 05/11/12 0344  LABPROT 18.7*  INR 1.53*   ABG No results found for this basename: PHART:2,PCO2:2,PO2:2,HCO3:2 in the last 72 hours  Studies/Results: No results found.  Anti-infectives: Anti-infectives     Start     Dose/Rate Route Frequency Ordered Stop   05/11/12 0600   vancomycin (VANCOCIN) 1,500 mg in sodium chloride 0.9 % 500 mL IVPB        1,500 mg 250 mL/hr over 120 Minutes Intravenous Every 12 hours 05/10/12 2049     05/10/12 2015   cefTRIAXone (ROCEPHIN) 1 g in dextrose 5 % 50 mL IVPB        1 g 100 mL/hr over 30 Minutes Intravenous Every 24 hours 05/10/12 2013     05/10/12 1900   vancomycin (VANCOCIN) IVPB 1000 mg/200 mL premix        1,000 mg 200 mL/hr over 60 Minutes Intravenous  Once 05/10/12 1845 05/10/12 2033   05/10/12 1800   ceFAZolin (ANCEF) IVPB 1 g/50 mL premix        1 g 100 mL/hr over 30 Minutes Intravenous  Once 05/10/12 1754 05/10/12 1900          Assessment/Plan: s/p * No  surgery found *  Interventional Radiology consult for paracentesis.  Has large volume of fluid in hernia sac. Continue IV antibiotics Laxative  LOS: 1 day    Noah Medina A 05/11/2012

## 2012-05-12 MED ORDER — VANCOMYCIN HCL IN DEXTROSE 1-5 GM/200ML-% IV SOLN
1000.0000 mg | Freq: Two times a day (BID) | INTRAVENOUS | Status: DC
  Administered 2012-05-13 – 2012-05-15 (×5): 1000 mg via INTRAVENOUS
  Filled 2012-05-12 (×5): qty 200

## 2012-05-12 MED ORDER — TRAMADOL HCL 50 MG PO TABS
50.0000 mg | ORAL_TABLET | ORAL | Status: DC | PRN
  Administered 2012-05-12 – 2012-05-14 (×7): 50 mg via ORAL
  Filled 2012-05-12 (×7): qty 1

## 2012-05-12 MED ORDER — PANTOPRAZOLE SODIUM 40 MG PO TBEC
40.0000 mg | DELAYED_RELEASE_TABLET | Freq: Every day | ORAL | Status: DC
  Administered 2012-05-12 – 2012-05-14 (×3): 40 mg via ORAL
  Filled 2012-05-12 (×3): qty 1

## 2012-05-12 NOTE — Progress Notes (Signed)
Subjective: Still with moderate superficial pain along lower abdomen No BM  Objective: Vital signs in last 24 hours: Temp:  [97.3 F (36.3 C)-98.3 F (36.8 C)] 97.9 F (36.6 C) (05/30 0626) Pulse Rate:  [81-84] 81  (05/30 0626) Resp:  [18] 18  (05/30 0626) BP: (122-141)/(55-73) 122/59 mmHg (05/30 0626) SpO2:  [96 %-97 %] 96 % (05/30 0626) Last BM Date: 05/10/12  Intake/Output from previous day: 05/29 0701 - 05/30 0700 In: 2195.3 [P.O.:480; I.V.:665.3; IV Piggyback:1050] Out: 1400 [Urine:1400] Intake/Output this shift:    Looks much better Erythema of abdominal wall improving Minimally tender  Lab Results:   Basename 05/11/12 0344 05/10/12 1705  WBC 3.0* 4.5  HGB 9.3* 9.4*  HCT 28.3* 29.1*  PLT 76* 82*   BMET  Basename 05/11/12 0344 05/10/12 1705  NA 138 139  K 3.1* 3.3*  CL 104 107  CO2 25 24  GLUCOSE 98 83  BUN 11 12  CREATININE 0.80 0.80  CALCIUM 8.3* 8.5   PT/INR  Basename 05/11/12 0344  LABPROT 18.7*  INR 1.53*   ABG No results found for this basename: PHART:2,PCO2:2,PO2:2,HCO3:2 in the last 72 hours  Studies/Results: US Paracentesis  05/11/2012  *RADIOLOGY REPORT*  Clinical Data: Hernia sac fluid  ULTRASOUND GUIDED PARACENTESIS  Comparison:  None  An ultrasound guided paracentesis was thoroughly discussed with the patient and questions answered.  The benefits, risks, alternatives and complications were also discussed.  The patient understands and wishes to proceed with the procedure.  Written consent was obtained.  Dr. Grace Isaac reviewed abdominal ultrasound and noted very little intraperitoneal fluid.  The hernia sac contained Medina small amount of complex fluid and the decision was made to send Medina small amount of fluid within the hernia sac for culture and Gram stain.  Ultrasound was performed to localize and mark an adequate pocket of fluid in the RLQ hernia sac quadrant of the abdomen.  The area was then prepped and draped in the normal sterile fashion.   1% Lidocaine was used for local anesthesia.  Under ultrasound guidance Medina 19 gauge Yueh catheter was introduced.  Paracentesis was performed.  The catheter was removed and Medina dressing applied.  Complications:  None  Findings:  Medina total of approximately 45 ml of yellow fluid was removed.  Medina fluid sample was sent for laboratory analysis.  IMPRESSION: Successful ultrasound guided paracentesis yielding 45 ml  of ascites.  Dr. Grace Isaac reviewed abdominal ultrasound and noted very little intraperitoneal fluid.  The hernia sac contained Medina small amount of complex fluid and as such, Medina small amount of fluid was aspirated and fluid sent for culture and Gram stain per Dr. Magnus Ivan.  Read by: Ralene Muskrat, P.Medina.-C  Original Report Authenticated By: Waynard Reeds, M.D.    Anti-infectives: Anti-infectives     Start     Dose/Rate Route Frequency Ordered Stop   05/11/12 0600   vancomycin (VANCOCIN) 1,500 mg in sodium chloride 0.9 % 500 mL IVPB        1,500 mg 250 mL/hr over 120 Minutes Intravenous Every 12 hours 05/10/12 2049     05/10/12 2015   cefTRIAXone (ROCEPHIN) 1 g in dextrose 5 % 50 mL IVPB        1 g 100 mL/hr over 30 Minutes Intravenous Every 24 hours 05/10/12 2013     05/10/12 1900   vancomycin (VANCOCIN) IVPB 1000 mg/200 mL premix        1,000 mg 200 mL/hr over 60 Minutes Intravenous  Once 05/10/12 1845 05/10/12 2033   05/10/12 1800   ceFAZolin (ANCEF) IVPB 1 g/50 mL premix        1 g 100 mL/hr over 30 Minutes Intravenous  Once 05/10/12 1754 05/10/12 1900          Assessment/Plan: s/p * No surgery found *  Ultrasound showed minimal intrabd ascites, moderate loculated fluid collection in hernia sac.  Gram stain neg for WBC or bacteria.  Cultures pending Will continue IV antibiotics in hopes of avoiding infection of the mesh.  LOS: 2 days    Noah Medina 05/12/2012

## 2012-05-12 NOTE — Progress Notes (Signed)
ANTIBIOTIC CONSULT NOTE - FOLLOW UP  Pharmacy Consult for Vancomycin Indication: Cellulitis  No Known Allergies  Patient Measurements: Height: 5\' 11"  (180.3 cm) Weight: 185 lb 6.5 oz (84.1 kg) IBW/kg (Calculated) : 75.3   Vital Signs: Temp: 98.3 F (36.8 C) (05/30 1405) Temp src: Oral (05/30 1405) BP: 129/74 mmHg (05/30 1405) Pulse Rate: 77  (05/30 1405) Intake/Output from previous day: 05/29 0701 - 05/30 0700 In: 2195.3 [P.O.:480; I.V.:665.3; IV Piggyback:1050] Out: 1400 [Urine:1400] Intake/Output from this shift: Total I/O In: 690 [P.O.:480; I.V.:160; IV Piggyback:50] Out: 1900 [Urine:1900]  Labs:  [facility] 05/11/12 0344 05/10/12 1705  WBC 3.0* 4.5  HGB 9.3* 9.4*  PLT 76* 82*  LABCREA -- --  CREATININE 0.80 0.80   Estimated Creatinine Clearance: 109.8 ml/min (by C-G formula based on Cr of 0.8).  Basename 05/12/12 1738  VANCOTROUGH 21.3*  VANCOPEAK --  Drue Dun --  GENTTROUGH --  GENTPEAK --  GENTRANDOM --  TOBRATROUGH --  TOBRAPEAK --  TOBRARND --  AMIKACINPEAK --  AMIKACINTROU --  AMIKACIN --     Microbiology: Recent Results (from the past 720 hour(s))  BODY FLUID CULTURE     Status: Normal (Preliminary result)   Collection Time   05/11/12 11:31 AM      Component Value Range Status Comment   Specimen Description FLUID HERNIA SACK FLD   Final    Special Requests NONE   Final    Gram Stain     Final    Value: NO WBC SEEN     NO ORGANISMS SEEN   Culture NO GROWTH 1 DAY   Final    Report Status PENDING   Incomplete     Assessment: 79 YOM with PMH cirrhosis and ascites 2/2 HepC, admitted for bloody drainage from abdominal incision and cellulitis 6 weeks post lap repair of recurrent ventral hernia.  Patient continues on IV antibiotics to avoid infection of mesh.  Vancomycin trough was high at 21.3.  SCr stable.  Goal of Therapy:  Vancomycin trough level 10-15 mcg/ml  Plan:  Vancomycin dose was hung at 1830, contacted RN to stop infusing at  1835. Adjust vancomycin to 1g IV q12h.  Next dose tomorrow AM.  Clance Boll 05/12/2012,6:35 PM

## 2012-05-13 LAB — BASIC METABOLIC PANEL
BUN: 12 mg/dL (ref 6–23)
CO2: 31 mEq/L (ref 19–32)
Chloride: 96 mEq/L (ref 96–112)
Creatinine, Ser: 0.99 mg/dL (ref 0.50–1.35)
Potassium: 3 mEq/L — ABNORMAL LOW (ref 3.5–5.1)

## 2012-05-13 LAB — PROTIME-INR: Prothrombin Time: 18 seconds — ABNORMAL HIGH (ref 11.6–15.2)

## 2012-05-13 LAB — CBC
HCT: 25.6 % — ABNORMAL LOW (ref 39.0–52.0)
MCV: 90.8 fL (ref 78.0–100.0)
RBC: 2.82 MIL/uL — ABNORMAL LOW (ref 4.22–5.81)
WBC: 3.3 10*3/uL — ABNORMAL LOW (ref 4.0–10.5)

## 2012-05-13 MED ORDER — POLYETHYLENE GLYCOL 3350 17 G PO PACK
17.0000 g | PACK | Freq: Every day | ORAL | Status: DC
  Administered 2012-05-13 – 2012-05-14 (×2): 17 g via ORAL
  Filled 2012-05-13 (×3): qty 1

## 2012-05-13 MED ORDER — POTASSIUM CHLORIDE CRYS ER 20 MEQ PO TBCR
20.0000 meq | EXTENDED_RELEASE_TABLET | Freq: Two times a day (BID) | ORAL | Status: DC
  Administered 2012-05-13 – 2012-05-14 (×4): 20 meq via ORAL
  Filled 2012-05-13 (×7): qty 1

## 2012-05-13 NOTE — Progress Notes (Signed)
Subjective: Improving but constipated  Objective: Vital signs in last 24 hours: Temp:  [98.3 F (36.8 C)-98.5 F (36.9 C)] 98.5 F (36.9 C) (05/31 6578) Pulse Rate:  [75-82] 75  (05/31 0608) Resp:  [18] 18  (05/31 4696) BP: (107-129)/(61-74) 107/61 mmHg (05/31 0608) SpO2:  [96 %-99 %] 96 % (05/31 0608) Last BM Date: 05/10/12  Intake/Output from previous day: 05/30 0701 - 05/31 0700 In: 970 [P.O.:480; I.V.:440; IV Piggyback:50] Out: 2400 [Urine:2400] Intake/Output this shift:    Abdominal erythema less Wound much cleaner  Lab Results:   Basename 05/13/12 0348 05/11/12 0344  WBC 3.3* 3.0*  HGB 8.4* 9.3*  HCT 25.6* 28.3*  PLT 67* 76*   BMET  Basename 05/13/12 0348 05/11/12 0344  NA 133* 138  K 3.0* 3.1*  CL 96 104  CO2 31 25  GLUCOSE 108* 98  BUN 12 11  CREATININE 0.99 0.80  CALCIUM 7.8* 8.3*   PT/INR  Basename 05/13/12 0348 05/11/12 0344  LABPROT 18.0* 18.7*  INR 1.46 1.53*   ABG No results found for this basename: PHART:2,PCO2:2,PO2:2,HCO3:2 in the last 72 hours  Studies/Results: US Paracentesis  05/11/2012  *RADIOLOGY REPORT*  Clinical Data: Hernia sac fluid  ULTRASOUND GUIDED PARACENTESIS  Comparison:  None  An ultrasound guided paracentesis was thoroughly discussed with the patient and questions answered.  The benefits, risks, alternatives and complications were also discussed.  The patient understands and wishes to proceed with the procedure.  Written consent was obtained.  Dr. Grace Isaac reviewed abdominal ultrasound and noted very little intraperitoneal fluid.  The hernia sac contained a small amount of complex fluid and the decision was made to send a small amount of fluid within the hernia sac for culture and Gram stain.  Ultrasound was performed to localize and mark an adequate pocket of fluid in the RLQ hernia sac quadrant of the abdomen.  The area was then prepped and draped in the normal sterile fashion.  1% Lidocaine was used for local anesthesia.   Under ultrasound guidance a 19 gauge Yueh catheter was introduced.  Paracentesis was performed.  The catheter was removed and a dressing applied.  Complications:  None  Findings:  A total of approximately 45 ml of yellow fluid was removed.  A fluid sample was sent for laboratory analysis.  IMPRESSION: Successful ultrasound guided paracentesis yielding 45 ml  of ascites.  Dr. Grace Isaac reviewed abdominal ultrasound and noted very little intraperitoneal fluid.  The hernia sac contained a small amount of complex fluid and as such, a small amount of fluid was aspirated and fluid sent for culture and Gram stain per Dr. Magnus Ivan.  Read by: Ralene Muskrat, P.A.-C  Original Report Authenticated By: Waynard Reeds, M.D.    Anti-infectives: Anti-infectives     Start     Dose/Rate Route Frequency Ordered Stop   05/13/12 0500   vancomycin (VANCOCIN) IVPB 1000 mg/200 mL premix        1,000 mg 200 mL/hr over 60 Minutes Intravenous Every 12 hours 05/12/12 1840     05/11/12 0600   vancomycin (VANCOCIN) 1,500 mg in sodium chloride 0.9 % 500 mL IVPB  Status:  Discontinued        1,500 mg 250 mL/hr over 120 Minutes Intravenous Every 12 hours 05/10/12 2049 05/12/12 1831   05/10/12 2015   cefTRIAXone (ROCEPHIN) 1 g in dextrose 5 % 50 mL IVPB        1 g 100 mL/hr over 30 Minutes Intravenous Every 24 hours 05/10/12  2013     05/10/12 1900   vancomycin (VANCOCIN) IVPB 1000 mg/200 mL premix        1,000 mg 200 mL/hr over 60 Minutes Intravenous  Once 05/10/12 1845 05/10/12 2033   05/10/12 1800   ceFAZolin (ANCEF) IVPB 1 g/50 mL premix        1 g 100 mL/hr over 30 Minutes Intravenous  Once 05/10/12 1754 05/10/12 1900          Assessment/Plan: s/p * No surgery found *  Hypokalemia.  Will replace po Start Miralax Continue IV antibiotics until monday  LOS: 3 days    Lauralei Clouse A 05/13/2012

## 2012-05-14 NOTE — Progress Notes (Signed)
Patient ID: Noah Medina, male   DOB: 02/25/1956, 56 y.o.   MRN: [id number] Subjective: No complaints Feeling much better  Objective: Physical Exam: BP 112/55  Pulse 76  Temp(Src) 98.3 F (36.8 C) (Oral)  Resp 18  Ht 5\' 11"  (1.803 m)  Wt 185 lb 6.5 oz (84.1 kg)  BMI 25.86 kg/m2  SpO2 98% Abdomen soft Erythema improved Wound healing    Labs: CBC  Basename 05/13/12 0348  WBC 3.3*  HGB 8.4*  HCT 25.6*  PLT 67*   BMET  Basename 05/13/12 0348  NA 133*  K 3.0*  CL 96  CO2 31  GLUCOSE 108*  BUN 12  CREATININE 0.99  CALCIUM 7.8*   LFT No results found for this basename: PROT,ALBUMIN,AST,ALT,ALKPHOS,BILITOT,BILIDIR,IBILI,LIPASE in the last 72 hours PT/INR  Basename 05/13/12 0348  LABPROT 18.0*  INR 1.46     Studies/Results: No results found.  Assessment/Plan: Abdominal wall cellulitis  Continue IV antibiotics Possible d/c tomorrow    LOS: 4 days    Noah Medina A MD 05/14/2012 9:07 AM

## 2012-05-15 LAB — BODY FLUID CULTURE

## 2012-05-15 MED ORDER — DOXYCYCLINE HYCLATE 100 MG PO TABS: 100.0000 mg | ORAL_TABLET | Freq: Two times a day (BID) | ORAL | Status: AC

## 2012-05-15 MED ORDER — LACTULOSE 10 G PO PACK: 10.0000 g | PACK | Freq: Three times a day (TID) | ORAL | Status: AC

## 2012-05-15 MED ORDER — HYDROMORPHONE HCL 4 MG PO TABS: 2.0000 mg | ORAL_TABLET | ORAL | Status: AC | PRN

## 2012-05-15 NOTE — Discharge Instructions (Signed)
Wash wound daily with soap and water Cover with dry bandage

## 2012-05-15 NOTE — Progress Notes (Signed)
Already seen by Dr. Magnus Ivan and discharge set.  Ovidio Kin, MD, [facility] Surgery Pager: [phone] Office phone:  [phone]

## 2012-05-15 NOTE — Progress Notes (Signed)
Patient ID: Noah Medina, male   DOB: Nov 10, 1956, 56 y.o.   MRN: [id number] Doing much better Minimal pain.  No BM yet  On exam, wound clean and erythema resolved  Plan:  Discharge home

## 2012-05-15 NOTE — Discharge Summary (Signed)
Physician Discharge Summary  Patient ID: Noah Medina MRN: [id number] DOB/AGE: Nov 09, 1956 56 y.o.  Admit date: 05/10/2012 Discharge date: 05/15/2012  Admission Diagnoses:  Abdominal wall cellulitis, history of cirrhosis   Discharge Diagnoses: same Active Problems:  * No active hospital problems. *    Discharged Condition: good  Hospital Course: admitted with cellulitis and chronic abdominal wound.  Had ventral hernia repair approx 6 weeks ago.  Started on IV antibiotics.  U/S shows minimal ascites.   Drainage of fluid from old hernia sac negative for infection. He improved quickly on IV antibiotics.  Discharged on oral Antibiotics  Consults: None  Significant Diagnostic Studies: labs: 0  Treatments: antibiotics  Discharge Exam: Blood pressure 121/74, pulse 78, temperature 97.8 F (36.6 C), temperature source Oral, resp. rate 19, height 5\' 11"  (1.803 m), weight 185 lb 6.5 oz (84.1 kg), SpO2 95.00%. General appearance: alert and no distress Incision/Wound:  Abdomen soft, wound clean, cellulitis resolved  Disposition: 01-Home or Self Care  Discharge Orders    Future Appointments: Provider: Department: Dept Phone: Center:   05/17/2012 9:10 AM Shelly Rubenstein, MD [facility] [phone] None     Medication List  As of 05/15/2012  7:23 AM   TAKE these medications         bumetanide 2 MG tablet   Commonly known as: BUMEX   Take 2 mg by mouth daily.      doxycycline 100 MG capsule   Commonly known as: VIBRAMYCIN   Take 100 mg by mouth 2 (two) times daily.      HYDROmorphone 4 MG tablet   Commonly known as: DILAUDID   Take 0.5-2 tablets (2-8 mg total) by mouth every 4 (four) hours as needed for pain.      lactulose 10 G packet   Commonly known as: CEPHULAC   Take 1 packet (10 g total) by mouth 3 (three) times daily.      mulitivitamin with minerals Tabs   Take 1 tablet by mouth daily.      naproxen sodium 220 MG tablet   Commonly known as: ANAPROX   Take 440  mg by mouth 2 (two) times daily as needed. Pain      omeprazole 20 MG capsule   Commonly known as: PRILOSEC   Take 20 mg by mouth daily.      spironolactone 100 MG tablet   Commonly known as: ALDACTONE   Take 100 mg by mouth daily before breakfast.           Follow-up Information    Follow up with Shelly Rubenstein, MD on 05/17/2012.   Contact information:   [employer] [facility] [address] [place] [address] [phone]          Signed: Shelly Rubenstein 05/15/2012, 7:23 AM

## 2012-05-17 ENCOUNTER — Encounter (INDEPENDENT_AMBULATORY_CARE_PROVIDER_SITE_OTHER): Payer: Self-pay | Admitting: Surgery

## 2012-05-17 ENCOUNTER — Ambulatory Visit (INDEPENDENT_AMBULATORY_CARE_PROVIDER_SITE_OTHER): Payer: Medicaid Other | Admitting: Surgery

## 2012-05-17 VITALS — BP 178/70 | HR 100 | Temp 99.0°F | Ht 71.0 in | Wt 178.0 lb

## 2012-05-17 DIAGNOSIS — Z09 Encounter for follow-up examination after completed treatment for conditions other than malignant neoplasm: Secondary | ICD-10-CM

## 2012-05-17 NOTE — Progress Notes (Signed)
Subjective:     Patient ID: Noah Medina, male   DOB: 1956/10/07, 56 y.o.   MRN: [id number]  HPI He is here for a followup from a hospital visit for cellulitis of his abdominal wall. He was just discharged 2 days ago. He is doing well. He denies any fevers. He is on oral antibiotics  Review of Systems     Objective:   Physical Exam    On exam, his abdomen is soft. There is minimal erythema in the wound is clean Assessment:     Postop abdominal wall cellulitis status post ventral hernia repair laparoscopic with mesh    Plan:     He will continue his antibiotics and wound care. I will see him back in 2 weeks

## 2012-05-30 ENCOUNTER — Ambulatory Visit (INDEPENDENT_AMBULATORY_CARE_PROVIDER_SITE_OTHER): Payer: Medicaid Other | Admitting: Surgery

## 2012-05-30 ENCOUNTER — Encounter (INDEPENDENT_AMBULATORY_CARE_PROVIDER_SITE_OTHER): Payer: Self-pay | Admitting: Surgery

## 2012-05-30 VITALS — BP 126/60 | HR 81 | Temp 97.8°F | Resp 14 | Ht 71.0 in | Wt 186.6 lb

## 2012-05-30 DIAGNOSIS — Z09 Encounter for follow-up examination after completed treatment for conditions other than malignant neoplasm: Secondary | ICD-10-CM

## 2012-05-30 NOTE — Progress Notes (Signed)
Subjective:     Patient ID: Noah Medina, male   DOB: 09/10/1956, 56 y.o.   MRN: [id number]  HPI He is here for another visit postoperatively. He has been off antibiotics for more than a week. He has no complaints.  Review of Systems     Objective:   Physical Exam On exam, the cellulitis has resolved. The wound is and was completely healed. I did insert an 18-gauge needle and drained 55 cc of seroma fluid    Assessment:     Patient status post ventral hernia repair with mesh laparoscopically    Plan:     He will continue his current wound care. I will see him back in 2 weeks

## 2012-06-15 ENCOUNTER — Encounter (INDEPENDENT_AMBULATORY_CARE_PROVIDER_SITE_OTHER): Payer: Self-pay | Admitting: Surgery

## 2012-06-15 ENCOUNTER — Ambulatory Visit (INDEPENDENT_AMBULATORY_CARE_PROVIDER_SITE_OTHER): Payer: Medicaid Other | Admitting: Surgery

## 2012-06-15 VITALS — BP 123/86 | HR 86 | Temp 98.4°F | Resp 20 | Ht 71.0 in | Wt 185.2 lb

## 2012-06-15 DIAGNOSIS — Z09 Encounter for follow-up examination after completed treatment for conditions other than malignant neoplasm: Secondary | ICD-10-CM

## 2012-06-15 NOTE — Progress Notes (Signed)
Subjective:     Patient ID: Noah Medina, male   DOB: August 11, 1956, 56 y.o.   MRN: [id number]  HPI He is here for another postoperative visit. He is having some increasing abdominal discomfort. He denies fevers or chills. He has no nausea or vomiting and is moving his bowels well.  Review of Systems     Objective:   Physical Exam He again has a moderate amount of ascites. I inserted a 16-gauge needle and drained approximately 80 cc of serous fluid. His wound is completely healed    Assessment:     Postop status post left upper ventral hernia repair with cirrhosis    Plan:     I will see him back in 4 weeks. I renewed his dilaudid

## 2012-06-23 ENCOUNTER — Telehealth (INDEPENDENT_AMBULATORY_CARE_PROVIDER_SITE_OTHER): Payer: Self-pay | Admitting: General Surgery

## 2012-06-23 NOTE — Telephone Encounter (Signed)
Pt calling for more pain meds.  Paged and updated Dr. Magnus Ivan; ordered Dilaudid 4 mg,  # 50, 1 po Q4H prn, no refill.  (Dr. Carolynne Edouard signed Rx for Dr. Magnus Ivan today.)  Pt called to pick up Rx at front desk.

## 2012-07-02 ENCOUNTER — Inpatient Hospital Stay (HOSPITAL_COMMUNITY)
Admission: EM | Admit: 2012-07-02 | Discharge: 2012-07-11 | DRG: 862 | Disposition: A | Discharge status: Home / Self Care | Payer: Medicaid Other | Attending: Surgery | Admitting: Surgery

## 2012-07-02 ENCOUNTER — Emergency Department (HOSPITAL_COMMUNITY): Payer: Medicaid Other

## 2012-07-02 ENCOUNTER — Inpatient Hospital Stay (HOSPITAL_COMMUNITY): Payer: Medicaid Other

## 2012-07-02 ENCOUNTER — Encounter (HOSPITAL_COMMUNITY): Payer: Self-pay | Admitting: *Deleted

## 2012-07-02 DIAGNOSIS — Y832 Surgical operation with anastomosis, bypass or graft as the cause of abnormal reaction of the patient, or of later complication, without mention of misadventure at the time of the procedure: Secondary | ICD-10-CM | POA: Diagnosis present

## 2012-07-02 DIAGNOSIS — L02211 Cutaneous abscess of abdominal wall: Secondary | ICD-10-CM

## 2012-07-02 DIAGNOSIS — F17200 Nicotine dependence, unspecified, uncomplicated: Secondary | ICD-10-CM | POA: Diagnosis present

## 2012-07-02 DIAGNOSIS — K7460 Unspecified cirrhosis of liver: Secondary | ICD-10-CM | POA: Diagnosis present

## 2012-07-02 DIAGNOSIS — R188 Other ascites: Secondary | ICD-10-CM | POA: Diagnosis present

## 2012-07-02 DIAGNOSIS — B1920 Unspecified viral hepatitis C without hepatic coma: Secondary | ICD-10-CM | POA: Diagnosis present

## 2012-07-02 DIAGNOSIS — A4902 Methicillin resistant Staphylococcus aureus infection, unspecified site: Secondary | ICD-10-CM | POA: Diagnosis present

## 2012-07-02 DIAGNOSIS — E876 Hypokalemia: Secondary | ICD-10-CM | POA: Diagnosis present

## 2012-07-02 DIAGNOSIS — E41 Nutritional marasmus: Secondary | ICD-10-CM | POA: Diagnosis present

## 2012-07-02 DIAGNOSIS — Y92009 Unspecified place in unspecified non-institutional (private) residence as the place of occurrence of the external cause: Secondary | ICD-10-CM

## 2012-07-02 DIAGNOSIS — IMO0002 Reserved for concepts with insufficient information to code with codable children: Principal | ICD-10-CM | POA: Diagnosis present

## 2012-07-02 DIAGNOSIS — R112 Nausea with vomiting, unspecified: Secondary | ICD-10-CM

## 2012-07-02 DIAGNOSIS — D649 Anemia, unspecified: Secondary | ICD-10-CM | POA: Diagnosis present

## 2012-07-02 DIAGNOSIS — E871 Hypo-osmolality and hyponatremia: Secondary | ICD-10-CM | POA: Diagnosis present

## 2012-07-02 DIAGNOSIS — Z22322 Carrier or suspected carrier of Methicillin resistant Staphylococcus aureus: Secondary | ICD-10-CM

## 2012-07-02 DIAGNOSIS — E43 Unspecified severe protein-calorie malnutrition: Secondary | ICD-10-CM | POA: Diagnosis present

## 2012-07-02 LAB — HEPATIC FUNCTION PANEL
ALT: 14 U/L (ref 0–53)
AST: 29 U/L (ref 0–37)
Albumin: 2.1 g/dL — ABNORMAL LOW (ref 3.5–5.2)
Alkaline Phosphatase: 76 U/L (ref 39–117)
Bilirubin, Direct: 0.3 mg/dL (ref 0.0–0.3)
Indirect Bilirubin: 0.9 mg/dL (ref 0.3–0.9)
Total Bilirubin: 1.2 mg/dL (ref 0.3–1.2)
Total Protein: 7.8 g/dL (ref 6.0–8.3)

## 2012-07-02 LAB — PROTEIN, BODY FLUID: Total protein, fluid: 3.6 g/dL

## 2012-07-02 LAB — SYNOVIAL CELL COUNT + DIFF, W/ CRYSTALS
Crystals, Fluid: NONE SEEN
Eosinophils-Synovial: 0 % (ref 0–1)
Lymphocytes-Synovial Fld: 4 % (ref 0–20)
Monocyte-Macrophage-Synovial Fluid: 0 % — ABNORMAL LOW (ref 50–90)
Neutrophil, Synovial: 96 % — ABNORMAL HIGH (ref 0–25)
WBC, Synovial: 1538 /mm3 — ABNORMAL HIGH (ref 0–200)

## 2012-07-02 LAB — LACTIC ACID, PLASMA: Lactic Acid, Venous: 2.1 mmol/L (ref 0.5–2.2)

## 2012-07-02 LAB — CBC WITH DIFFERENTIAL/PLATELET
Basophils Relative: 0 % (ref 0–1)
Eosinophils Absolute: 0 10*3/uL (ref 0.0–0.7)
Eosinophils Relative: 0 % (ref 0–5)
Lymphs Abs: 0.8 10*3/uL (ref 0.7–4.0)
MCH: 28.9 pg (ref 26.0–34.0)
MCHC: 33.7 g/dL (ref 30.0–36.0)
MCV: 85.8 fL (ref 78.0–100.0)
Monocytes Relative: 10 % (ref 3–12)
Platelets: 226 10*3/uL (ref 150–400)
RBC: 3.6 MIL/uL — ABNORMAL LOW (ref 4.22–5.81)

## 2012-07-02 LAB — BASIC METABOLIC PANEL
CO2: 29 mEq/L (ref 19–32)
GFR calc non Af Amer: >90 mL/min (ref >90)
Glucose, Bld: 138 mg/dL — ABNORMAL HIGH (ref 70–99)
Potassium: 3.4 mEq/L — ABNORMAL LOW (ref 3.5–5.1)
Sodium: 129 mEq/L — ABNORMAL LOW (ref 135–145)

## 2012-07-02 LAB — ALBUMIN, FLUID (OTHER): Albumin, Fluid: 1.1 g/dL

## 2012-07-02 LAB — PROTIME-INR
INR: 1.55 — ABNORMAL HIGH (ref 0.00–1.49)
Prothrombin Time: 18.9 s — ABNORMAL HIGH (ref 11.6–15.2)

## 2012-07-02 LAB — URINALYSIS, ROUTINE W REFLEX MICROSCOPIC
Glucose, UA: NEGATIVE mg/dL
Ketones, ur: NEGATIVE mg/dL
pH: 6 (ref 5.0–8.0)

## 2012-07-02 LAB — APTT: aPTT: 37 seconds (ref 24–37)

## 2012-07-02 LAB — LIPASE, BLOOD: Lipase: 21 U/L (ref 11–59)

## 2012-07-02 LAB — GLUCOSE, PERITONEAL FLUID: Glucose, Peritoneal Fluid: 1 mg/dL

## 2012-07-02 MED ORDER — VANCOMYCIN HCL IN DEXTROSE 1-5 GM/200ML-% IV SOLN
1000.0000 mg | Freq: Three times a day (TID) | INTRAVENOUS | Status: DC
  Administered 2012-07-02 – 2012-07-03 (×3): 1000 mg via INTRAVENOUS
  Filled 2012-07-02 (×6): qty 200

## 2012-07-02 MED ORDER — ONDANSETRON HCL 4 MG/2ML IJ SOLN
4.0000 mg | Freq: Four times a day (QID) | INTRAMUSCULAR | Status: DC | PRN
  Administered 2012-07-09 – 2012-07-10 (×2): 4 mg via INTRAVENOUS
  Filled 2012-07-02 (×3): qty 2

## 2012-07-02 MED ORDER — DEXTROSE 5 % IV SOLN
2.0000 g | Freq: Once | INTRAVENOUS | Status: AC
  Administered 2012-07-02: 2 g via INTRAVENOUS
  Filled 2012-07-02: qty 2

## 2012-07-02 MED ORDER — POTASSIUM CHLORIDE IN NACL 20-0.9 MEQ/L-% IV SOLN
INTRAVENOUS | Status: DC
  Administered 2012-07-02 – 2012-07-06 (×3): via INTRAVENOUS
  Filled 2012-07-02 (×7): qty 1000

## 2012-07-02 MED ORDER — PIPERACILLIN-TAZOBACTAM 3.375 G IVPB 30 MIN
3.3750 g | Freq: Once | INTRAVENOUS | Status: DC
  Filled 2012-07-02: qty 50

## 2012-07-02 MED ORDER — PIPERACILLIN-TAZOBACTAM 3.375 G IVPB
3.3750 g | Freq: Three times a day (TID) | INTRAVENOUS | Status: DC
  Administered 2012-07-02 – 2012-07-07 (×13): 3.375 g via INTRAVENOUS
  Filled 2012-07-02 (×17): qty 50

## 2012-07-02 MED ORDER — FENTANYL CITRATE 0.05 MG/ML IJ SOLN
INTRAMUSCULAR | Status: AC
  Filled 2012-07-02: qty 4

## 2012-07-02 MED ORDER — SPIRONOLACTONE 100 MG PO TABS
100.0000 mg | ORAL_TABLET | Freq: Every day | ORAL | Status: DC
  Administered 2012-07-03 – 2012-07-10 (×8): 100 mg via ORAL
  Filled 2012-07-02 (×11): qty 1

## 2012-07-02 MED ORDER — SODIUM CHLORIDE 0.9 % IV BOLUS (SEPSIS)
1000.0000 mL | Freq: Once | INTRAVENOUS | Status: AC
  Administered 2012-07-02: 1000 mL via INTRAVENOUS

## 2012-07-02 MED ORDER — BUMETANIDE 2 MG PO TABS
2.0000 mg | ORAL_TABLET | Freq: Every day | ORAL | Status: DC
  Administered 2012-07-02 – 2012-07-10 (×9): 2 mg via ORAL
  Filled 2012-07-02 (×10): qty 1

## 2012-07-02 MED ORDER — MIDAZOLAM HCL 5 MG/5ML IJ SOLN
INTRAMUSCULAR | Status: AC | PRN
  Administered 2012-07-02: 1 mg via INTRAVENOUS
  Administered 2012-07-02: 2 mg via INTRAVENOUS
  Administered 2012-07-02: 1 mg via INTRAVENOUS

## 2012-07-02 MED ORDER — ONDANSETRON HCL 4 MG/2ML IJ SOLN
4.0000 mg | Freq: Once | INTRAMUSCULAR | Status: AC
  Administered 2012-07-02: 4 mg via INTRAVENOUS
  Filled 2012-07-02: qty 2

## 2012-07-02 MED ORDER — FENTANYL CITRATE 0.05 MG/ML IJ SOLN
INTRAMUSCULAR | Status: AC
  Filled 2012-07-02: qty 2

## 2012-07-02 MED ORDER — IOHEXOL 300 MG/ML  SOLN
100.0000 mL | Freq: Once | INTRAMUSCULAR | Status: AC | PRN
  Administered 2012-07-02: 100 mL via INTRAVENOUS

## 2012-07-02 MED ORDER — HYDROMORPHONE HCL PF 1 MG/ML IJ SOLN
1.0000 mg | Freq: Once | INTRAMUSCULAR | Status: AC
  Administered 2012-07-02: 1 mg via INTRAVENOUS
  Filled 2012-07-02: qty 1

## 2012-07-02 MED ORDER — SODIUM CHLORIDE 0.9 % IV SOLN
Freq: Once | INTRAVENOUS | Status: AC
  Administered 2012-07-02: 08:00:00 via INTRAVENOUS

## 2012-07-02 MED ORDER — VANCOMYCIN HCL IN DEXTROSE 1-5 GM/200ML-% IV SOLN
1000.0000 mg | Freq: Once | INTRAVENOUS | Status: AC
  Administered 2012-07-02: 1000 mg via INTRAVENOUS
  Filled 2012-07-02: qty 200

## 2012-07-02 MED ORDER — PANTOPRAZOLE SODIUM 40 MG IV SOLR
40.0000 mg | Freq: Every day | INTRAVENOUS | Status: DC
  Administered 2012-07-02: 40 mg via INTRAVENOUS
  Filled 2012-07-02 (×2): qty 40

## 2012-07-02 MED ORDER — HYDROMORPHONE HCL PF 1 MG/ML IJ SOLN
1.0000 mg | INTRAMUSCULAR | Status: DC | PRN
  Administered 2012-07-02 – 2012-07-03 (×5): 1 mg via INTRAVENOUS
  Filled 2012-07-02 (×6): qty 1

## 2012-07-02 MED ORDER — DIPHENHYDRAMINE HCL 50 MG/ML IJ SOLN: 12.5000 mg | Freq: Four times a day (QID) | INTRAMUSCULAR | Status: DC | PRN

## 2012-07-02 MED ORDER — MIDAZOLAM HCL 2 MG/2ML IJ SOLN
INTRAMUSCULAR | Status: AC
  Filled 2012-07-02: qty 2

## 2012-07-02 MED ORDER — DIPHENHYDRAMINE HCL 12.5 MG/5ML PO ELIX: 12.5000 mg | ORAL_SOLUTION | Freq: Four times a day (QID) | ORAL | Status: DC | PRN

## 2012-07-02 MED ORDER — FENTANYL CITRATE 0.05 MG/ML IJ SOLN
INTRAMUSCULAR | Status: AC | PRN
  Administered 2012-07-02 (×2): 50 ug via INTRAVENOUS
  Administered 2012-07-02: 100 ug via INTRAVENOUS

## 2012-07-02 MED ORDER — VANCOMYCIN HCL 10 G IV SOLR: 1.0000 g | Freq: Once | INTRAVENOUS | Status: DC

## 2012-07-02 MED ORDER — MIDAZOLAM HCL 2 MG/2ML IJ SOLN
INTRAMUSCULAR | Status: AC
  Filled 2012-07-02: qty 4

## 2012-07-02 NOTE — Progress Notes (Signed)
Advised Dr Donell Beers regarding critical lab value pt on Vanc and zosyn. Annitta Needs, RN

## 2012-07-02 NOTE — ED Notes (Signed)
EMS called to home.  Found patient ambulatory and Alert and oriented. Complaining of abdominal pain with nausea.  Patient began vomiting  On arrival to ED.  V/S stable.  Patient shows abdominal distention  In mid lower abdomen that was diagnosed as a hernia.  Patient  Rate his pain 8 to 10.

## 2012-07-02 NOTE — ED Notes (Addendum)
Patient back for CT, call placed to 5W and update to RN on plan of care.

## 2012-07-02 NOTE — ED Provider Notes (Signed)
History     CSN: [id number]  Arrival date & time 07/02/12  4098   First MD Initiated Contact with Patient 07/02/12 [phone]      Chief Complaint  Patient presents with  . Nausea  . Emesis  . Abdominal Pain    (Consider location/radiation/quality/duration/timing/severity/associated sxs/prior treatment) HPI Comments: Patient with hx of large ventral hernia, s/p 2 surgeries last one being in April, 2013 comes in with cc of abd pain. Pt states that he started having some nausea and emesis 3 days ago, and abd pain 2 days ago. The pain is located diffusely around the hernia. The pain is severe, and throbbing. There is no change in size or color of the skin overlying the hernia. Patient has nausea with emesis, and has had about 15 episode of emesis over the past 24 hours, last one being in the ED and was bilious. Pt has no apetite, and states that he has emesis within minutes of any ingestion. Pt unsure if he is passing flatus today, but had 3-4 loose BM yday, non bloody. No pain meds taken at home.  Patient is a 56 y.o. male presenting with vomiting and abdominal pain. The history is provided by the patient.  Emesis  Associated symptoms include abdominal pain. Pertinent negatives include no arthralgias, no chills, no cough, no diarrhea, no fever and no headaches.  Abdominal Pain The primary symptoms of the illness include abdominal pain, nausea and vomiting. The primary symptoms of the illness do not include fever, shortness of breath, diarrhea or dysuria.  Symptoms associated with the illness do not include chills or constipation.    Past Medical History  Diagnosis Date  . Cirrhosis of liver   . Hepatitis C 03-28-12    ? 80's-prev. IV drug abuse  . 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 Blood transfusion 03-28-12    '81-hx. of bleeding ulcer  . History of bleeding ulcers 03-28-12    Hx. '81  . GERD (gastroesophageal reflux disease) 03-26-12    Hx. reflux. ulcers in  past, occ. OTC med    Past Surgical History  Procedure Date  . Umbilical hernia repair   . Hernia repair 08/24/11    ventral hernia with mesh  . Hernia repair 11'11    Umbilical  . Peptic ulcer 03-26-12    open peptic ulcer surgery repair- '81  . Incisional hernia repair 03/26/2012  . Incisional hernia repair 03/30/2012    Procedure: LAPAROSCOPIC INCISIONAL HERNIA;  Surgeon: Shelly Rubenstein, MD;  Location: [facility] ORS;  Service: General;  Laterality: N/A;  Laparoscopic Incisional/ventral Hernia Repair with Mesh    Family History  Problem Relation Age of Onset  . Cancer Mother     breast  . Cancer Father     brain  . Heart disease Brother   . Cancer Sister     breast  . Cancer Sister     breast    History  Substance Use Topics  . Smoking status: Current Everyday Smoker -- 0.2 packs/day for 38 years    Types: Cigarettes  . Smokeless tobacco: Never Used  . Alcohol Use: No     past hx. recreational drinking- none in 16 yrs.      Review of Systems  Constitutional: Negative for fever, chills and activity change.  HENT: Negative for neck pain.   Eyes: Negative for visual disturbance.  Respiratory: Negative for cough, chest tightness and shortness of breath.   Cardiovascular: Negative for chest pain.  Gastrointestinal: Positive for nausea, vomiting, abdominal pain, blood in stool and abdominal distention. Negative for diarrhea and constipation.  Genitourinary: Negative for dysuria, enuresis and difficulty urinating.  Musculoskeletal: Negative for arthralgias.  Neurological: Positive for dizziness and light-headedness. Negative for headaches.  Psychiatric/Behavioral: Negative for confusion.    Allergies  Review of patient's allergies indicates no known allergies.  Home Medications   Current Outpatient Rx  Name Route Sig Dispense Refill  . BUMETANIDE 2 MG PO TABS Oral Take 2 mg by mouth daily.    Marland Kitchen HYDROMORPHONE HCL 4 MG PO TABS Oral Take 4 mg by mouth every 4 (four)  hours as needed. pain    . ADULT MULTIVITAMIN W/MINERALS CH Oral Take 1 tablet by mouth daily.    Marland Kitchen NAPROXEN SODIUM 220 MG PO TABS Oral Take 440 mg by mouth 2 (two) times daily as needed. Pain    . OMEPRAZOLE 20 MG PO CPDR Oral Take 20 mg by mouth daily.    Marland Kitchen SPIRONOLACTONE 100 MG PO TABS Oral Take 100 mg by mouth daily before breakfast.       BP 133/61  Pulse 80  Temp 99 F (37.2 C) (Oral)  Resp 15  SpO2 100%  Physical Exam  Constitutional: He is oriented to person, place, and time. He appears well-developed.  HENT:  Head: Normocephalic and atraumatic.  Eyes: Conjunctivae and EOM are normal. Pupils are equal, round, and reactive to light.  Neck: Normal range of motion. Neck supple.  Cardiovascular: Normal rate, regular rhythm and normal heart sounds.   Pulmonary/Chest: Effort normal and breath sounds normal. No respiratory distress. He has no wheezes.  Abdominal: He exhibits distension. There is tenderness. There is guarding. There is no rebound.       Patient has a large ventral hernia, that i was unable to completely reduce with firm pressure lasting 30 seconds. Patient has diffuse abd tenderness, with a little bit of erythema overlying the abd skin, and the bowel sounds are hypoactive. + guarding, but no rebound tenderness.  Neurological: He is alert and oriented to person, place, and time.  Skin: Skin is warm.    ED Course  PARACENTESIS Date/Time: 07/02/2012 11:08 AM Performed by: Derwood Kaplan Authorized by: Derwood Kaplan Consent: Verbal consent obtained. Written consent not obtained. Risks and benefits: risks, benefits and alternatives were discussed Consent given by: patient Patient understanding: patient states understanding of the procedure being performed Test results: test results available and properly labeled Site marked: the operative site was marked Patient identity confirmed: verbally with patient Time out: Immediately prior to procedure a "time out" was  called to verify the correct patient, procedure, equipment, support staff and site/side marked as required. Procedure purpose: diagnostic Indications: abdominal discomfort secondary to ascites and suspected peritonitis Anesthesia: local infiltration Local anesthetic: lidocaine 2% with epinephrine Patient sedated: no Preparation: Patient was prepped and draped in the usual sterile fashion. Needle gauge: 18 Ultrasound guidance: yes Puncture site: left lower quadrant Fluid removed: 20(ml) Fluid appearance: clear Dressing: 4x4 sterile gauze and pressure dressing Patient tolerance: Patient tolerated the procedure well with no immediate complications.   (including critical care time)  Labs Reviewed  CBC WITH DIFFERENTIAL - Abnormal; Notable for the following:    WBC 10.6 (*)     RBC 3.60 (*)     Hemoglobin 10.4 (*)     HCT 30.9 (*)     RDW 16.6 (*)     Neutrophils Relative 82 (*)     Neutro Abs 8.7 (*)  Lymphocytes Relative 8 (*)     Monocytes Absolute 1.1 (*)     All other components within normal limits  PROTIME-INR - Abnormal; Notable for the following:    Prothrombin Time 18.9 (*)     INR 1.55 (*)     All other components within normal limits  APTT  LACTIC ACID, PLASMA  BASIC METABOLIC PANEL  LIPASE, BLOOD  HEPATIC FUNCTION PANEL  URINALYSIS, ROUTINE W REFLEX MICROSCOPIC   No results found.   No diagnosis found.    MDM  Patient with hx of abd surgery for hernia comes in with abd pain and n/v. Pt's pain is diffuse, and the abd pain is more severe than usual with associated nausea and emesis that is new. Based on the exam, may be there is a cellulitis. Concerns for SBO, incarcerated hernia at this time. It is possible that patient has SBP, but the pretest probability is low. We will get AAS, and CT scan. Will control symptoms for now while the diagnostic workup gets completed.   9:38 AM CT shows concerns for abscess. There is ascites as well. Surgery informed. Will  give vanc and ceftriaxone, the latter for SBP coverage. Will consider SBP workup.        Derwood Kaplan, MD 07/02/12 1739

## 2012-07-02 NOTE — Consult Note (Signed)
[facility] Medical Consultation  Noah Medina ZOX:[id number] DOB: 02/10/1956 DOA: 07/02/2012 PCP: Iona Hansen, NP   Requesting physician: Dr. Almond Lint Date of consultation: 07/02/12 Reason for consultation: Medical management of cirrhosis  Impression/Recommendations Principal Problem:  *Non-healing surgical wound  Admitted by surgery.  For percutaneous drainage.  Diagnostic paracentesis done by EDP.  Follow up cultures. 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  LFTs not elevated.  Continue diuretics.  Check coags to evaluate synthetic function.  Hypokalemia  Potassium added to IVF.  Severe malnutrition  Dietician evaluation.   I will followup again tomorrow. Please contact me if I can be of assistance in the meanwhile. Thank you for this consultation.  Chief Complaint: Nausea and bilious emesis  HPI:  Noah Medina is a 56 year old male with cirrhosis who presents with  2 days of nausea, bilious emesis, and increased abdominal pain. He underwent repair of recurrent ventral hernia in April with mesh, and since, has continued to have difficulty with fluid around the mesh. He denies feeling this sick previously. He last saw Dr. Magnus Ivan 2 weeks ago. He has had to be admitted twice before for this issue. He has had drainage of the fluid in IR, but at the time, it appeared clear. He denies fevers, but does feel warm. His abdomen has some chronic skin changes that he denies appearing different. 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  He takes Bumex and Spironolactone chronically.     Review of Systems:  Constitutional: No fever or chills;  Appetite normal; No weight loss, +fatigue  HEENT: No blurry vision or diplopia, no pharyngitis or dysphagia CV: No chest pain or arrhythmia.  Resp: No SOB, no cough. GI: + N/V and abdominal pain, no diarrhea, no melena or hematochezia.  GU: No dysuria  or hematuria.  MSK: no myalgias/arthralgias.  Neuro:  No headache or focal neurological deficits.  Psych: No depression or anxiety.  Endo: No thyroid disease or DM.  Skin: No rashes or lesions.  Heme: No anemia or blood dyscrasia   Past Medical History  Diagnosis Date  . Cirrhosis of liver   . Hepatitis C 03-28-12    ? 80's-prev. IV drug abuse  . 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 Blood transfusion 03-28-12    '81-hx. of bleeding ulcer  . History of bleeding ulcers 03-28-12    Hx. '81  . GERD (gastroesophageal reflux disease) 03-26-12    Hx. reflux. ulcers in past, occ. OTC med   Past Surgical History  Procedure Date  . Umbilical hernia repair   . Hernia repair 08/24/11    ventral hernia with mesh  . Hernia repair 11'11    Umbilical  . Peptic ulcer 03-26-12    open peptic ulcer surgery repair- '81  . Incisional hernia repair 03/26/2012  . Incisional hernia repair 03/30/2012    Procedure: LAPAROSCOPIC INCISIONAL HERNIA;  Surgeon: Shelly Rubenstein, MD;  Location: [facility];  Service: General;  Laterality: N/A;  Laparoscopic Incisional/ventral Hernia Repair with Mesh   Social History:  Reports that he has been smoking Cigarettes.  He has a 9.5 pack-year smoking history. He has never used smokeless tobacco. 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  Lives alone.  Disabled.  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  1 son.  Independent of ADLs and ambulation.  No Known Allergies Family History  Problem Relation Age of Onset  . Cancer Mother     breast  . Cancer Father     brain  . Heart disease Brother   . Cancer Sister     breast  . Cancer Sister     breast    Prior to Admission medications   Medication Sig Start Date End Date Taking? Authorizing Provider  bumetanide (BUMEX) 2 MG tablet Take 2 mg by mouth daily.   Yes Historical Provider, MD  HYDROmorphone (DILAUDID) 4 MG tablet  Take 4 mg by mouth every 4 (four) hours as needed. pain   Yes Historical Provider, MD  Multiple Vitamin (MULITIVITAMIN WITH MINERALS) TABS Take 1 tablet by mouth daily.   Yes Historical Provider, MD  naproxen sodium (ANAPROX) 220 MG tablet Take 440 mg by mouth 2 (two) times daily as needed. Pain   Yes Historical Provider, MD  omeprazole (PRILOSEC) 20 MG capsule Take 20 mg by mouth daily.   Yes Historical Provider, MD  spironolactone (ALDACTONE) 100 MG tablet Take 100 mg by mouth daily before breakfast.    Yes Historical Provider, MD   Physical Exam: Blood pressure 133/61, pulse 80, temperature 99 F (37.2 C), temperature source Oral, resp. rate 15, SpO2 100.00%. Filed Vitals:   07/02/12 0654 07/02/12 0702  BP: 112/58 133/61  Pulse: 80 80  Temp:  99 F (37.2 C)  TempSrc:  Oral  Resp: 18 15  SpO2:  100%   Gen: Well developed, thin male in NAD Head: [place]/AT.   Eyes: PERRL, EOMI, sclerae non-icteric Nose: No exudates. Mouth: Edentulous, mucous membranes moist. Neck: Supple, No TM, No LAD Chest: Lungs CTAB Heart: RRR, No M/R/G Abdomen: Protuberant with varices and a central hernia, mildly tender.  Normal active BS. Musculoskeletal: Normal range of motion. He exhibits no edema and no tenderness.  Lymphadenopathy: No cervical adenopathy.  Neurological: CN II-XII grossly intact.  He is alert and oriented to person, place, and time. Coordination normal.  Skin: Skin is warm and dry. He is not diaphoretic.  Psychiatric: He has a normal mood and affect. His behavior is normal. Judgment and thought content normal.   Labs on Admission:  Basic Metabolic Panel:  Lab 07/02/12 1610  NA 129*  K 3.4*  CL 89*  CO2 29  GLUCOSE 138*  BUN 16  CREATININE 0.97  CALCIUM 9.1  MG --  PHOS --   Liver Function Tests:  Lab 07/02/12 0720  AST 29  ALT 14  ALKPHOS 76  BILITOT 1.2  PROT 7.8  ALBUMIN 2.1*    Lab 07/02/12 0720  LIPASE 21  AMYLASE --   CBC:  Lab 07/02/12 0720  WBC 10.6*    NEUTROABS 8.7*  HGB 10.4*  HCT 30.9*  MCV 85.8  PLT 226    Radiological Exams on Admission:  Ct Abdomen Pelvis W Contrast 07/02/2012 IMPRESSION:  1. Very large (21 cm in diameter) collection of gas and fluid in the anterior abdomen, with a flat sheet of gas internally within this structure, suspicious for a large abscess along the hernia mesh. This may be multiloculated given the differing vertical levels of gas within the abscess. 2.  Cirrhotic liver disease with considerable varices. 3.  Mild splenomegaly. 4.  Wall thickening and mucosal enhancement in the colon, query colitis. 5.  Abnormal wall thickening and mild dilatation of the proximal small bowel, probably ileus secondary to the large abscess. 6.  Small pockets of fluid elsewhere in the abdomen are indeterminate with regard to whether they represent abscesses or simply ascites. 7.  Possible left unilateral pars defect at L5.  These results were called by telephone on 07/02/2012 at 9:13 a.m. to Dr. Derwood Kaplan, who verbally acknowledged these results.  Original Report Authenticated By: Dellia Cloud, M.D.    Dg Abd Acute W/chest 07/02/2012 IMPRESSION: No acute chest process  Negative for obstruction or dilatation  Suspect abdominal ascites  Original Report Authenticated By: Judie Petit. Ruel Favors, M.D.    EKG: Not done.  Time spent: 1 hour.  Koni Kannan [facility] Pager [phone]  If 7PM-7AM, please contact night-coverage [email or web address] Password [facility] 07/02/2012, 12:35 PM

## 2012-07-02 NOTE — H&P (Signed)
Noah Medina is an 56 y.o. male.   Chief Complaint: n/v abdominal pain after hernia repair HPI:  Pt is 56 year old male with cirrhosis who presents with around 2 days of nausea, bilious emesis, and increased abdominal pain.  He underwent repair of recurrent ventral hernia in April with mesh, and since, has continued to have difficulty with fluid around the mesh.  He denies feeling this sick previously.  He last saw Dr. Magnus Ivan 2 weeks ago.  He has had to be admitted twice before for this issue.  He has had drainage of the fluid in IR, but at the time, it appeared clear.  He denies fevers, but does feel warm.  His abdomen has some chronic skin changes that he denies appearing different.    Past Medical History  Diagnosis Date  . Cirrhosis of liver   . Hepatitis C 03-28-12    ? 80's-prev. IV drug abuse  . 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 Blood transfusion 03-28-12    '81-hx. of bleeding ulcer  . History of bleeding ulcers 03-28-12    Hx. '81  . GERD (gastroesophageal reflux disease) 03-26-12    Hx. reflux. ulcers in past, occ. OTC med    Past Surgical History  Procedure Date  . Umbilical hernia repair   . Hernia repair 08/24/11    ventral hernia with mesh  . Hernia repair 11'11    Umbilical  . Peptic ulcer 03-26-12    open peptic ulcer surgery repair- '81  . Incisional hernia repair 03/26/2012  . Incisional hernia repair 03/30/2012    Procedure: LAPAROSCOPIC INCISIONAL HERNIA;  Surgeon: Shelly Rubenstein, MD;  Location: [facility];  Service: General;  Laterality: N/A;  Laparoscopic Incisional/ventral Hernia Repair with Mesh    Family History  Problem Relation Age of Onset  . Cancer Mother     breast  . Cancer Father     brain  . Heart disease Brother   . Cancer Sister     breast  . Cancer Sister     breast   Social History:  reports that he has been smoking Cigarettes.  He has a 9.5 pack-year smoking history. He has never used smokeless tobacco. He  reports that he does not drink alcohol or use illicit drugs.  Allergies: No Known Allergies   (Not in a hospital admission)  Results for orders placed during the hospital encounter of 07/02/12 (from the past 48 hour(s))  APTT     Status: Normal   Collection Time   07/02/12  7:20 AM      Component Value Range Comment   aPTT 37  24 - 37 seconds   BASIC METABOLIC PANEL     Status: Abnormal   Collection Time   07/02/12  7:20 AM      Component Value Range Comment   Sodium 129 (*) 135 - 145 mEq/L    Potassium 3.4 (*) 3.5 - 5.1 mEq/L    Chloride 89 (*) 96 - 112 mEq/L    CO2 29  19 - 32 mEq/L    Glucose, Bld 138 (*) 70 - 99 mg/dL    BUN 16  6 - 23 mg/dL    Creatinine, Ser 1.47  0.50 - 1.35 mg/dL    Calcium 9.1  8.4 - 82.9 mg/dL    GFR calc non Af Amer >90  >90 mL/min    GFR calc Af Amer >90  >90 mL/min   CBC WITH DIFFERENTIAL  Status: Abnormal   Collection Time   07/02/12  7:20 AM      Component Value Range Comment   WBC 10.6 (*) 4.0 - 10.5 K/uL    RBC 3.60 (*) 4.22 - 5.81 MIL/uL    Hemoglobin 10.4 (*) 13.0 - 17.0 g/dL    HCT 16.1 (*) 09.6 - 52.0 %    MCV 85.8  78.0 - 100.0 fL    MCH 28.9  26.0 - 34.0 pg    MCHC 33.7  30.0 - 36.0 g/dL    RDW 04.5 (*) 40.9 - 15.5 %    Platelets 226  150 - 400 K/uL    Neutrophils Relative 82 (*) 43 - 77 %    Neutro Abs 8.7 (*) 1.7 - 7.7 K/uL    Lymphocytes Relative 8 (*) 12 - 46 %    Lymphs Abs 0.8  0.7 - 4.0 K/uL    Monocytes Relative 10  3 - 12 %    Monocytes Absolute 1.1 (*) 0.1 - 1.0 K/uL    Eosinophils Relative 0  0 - 5 %    Eosinophils Absolute 0.0  0.0 - 0.7 K/uL    Basophils Relative 0  0 - 1 %    Basophils Absolute 0.0  0.0 - 0.1 K/uL   LIPASE, BLOOD     Status: Normal   Collection Time   07/02/12  7:20 AM      Component Value Range Comment   Lipase 21  11 - 59 U/L   HEPATIC FUNCTION PANEL     Status: Abnormal   Collection Time   07/02/12  7:20 AM      Component Value Range Comment   Total Protein 7.8  6.0 - 8.3 g/dL     Albumin 2.1 (*) 3.5 - 5.2 g/dL    AST 29  0 - 37 U/L    ALT 14  0 - 53 U/L    Alkaline Phosphatase 76  39 - 117 U/L    Total Bilirubin 1.2  0.3 - 1.2 mg/dL    Bilirubin, Direct 0.3  0.0 - 0.3 mg/dL    Indirect Bilirubin 0.9  0.3 - 0.9 mg/dL   PROTIME-INR     Status: Abnormal   Collection Time   07/02/12  7:20 AM      Component Value Range Comment   Prothrombin Time 18.9 (*) 11.6 - 15.2 seconds    INR 1.55 (*) 0.00 - 1.49   LACTIC ACID, PLASMA     Status: Normal   Collection Time   07/02/12  7:20 AM      Component Value Range Comment   Lactic Acid, Venous 2.1  0.5 - 2.2 mmol/L   URINALYSIS, ROUTINE W REFLEX MICROSCOPIC     Status: Abnormal   Collection Time   07/02/12 10:09 AM      Component Value Range Comment   Color, Urine AMBER (*) YELLOW BIOCHEMICALS MAY BE AFFECTED BY COLOR   APPearance CLEAR  CLEAR    Specific Gravity, Urine >1.046 (*) 1.005 - 1.030    pH 6.0  5.0 - 8.0    Glucose, UA NEGATIVE  NEGATIVE mg/dL    Hgb urine dipstick NEGATIVE  NEGATIVE    Bilirubin Urine SMALL (*) NEGATIVE    Ketones, ur NEGATIVE  NEGATIVE mg/dL    Protein, ur NEGATIVE  NEGATIVE mg/dL    Urobilinogen, UA 0.2  0.0 - 1.0 mg/dL    Nitrite NEGATIVE  NEGATIVE    Leukocytes, UA NEGATIVE  NEGATIVE MICROSCOPIC NOT DONE ON URINES WITH NEGATIVE PROTEIN, BLOOD, LEUKOCYTES, NITRITE, OR GLUCOSE <1000 mg/dL.   Ct Abdomen Pelvis W Contrast  07/02/2012  *RADIOLOGY REPORT*  Clinical Data: Abdominal pain and nausea.  Vomiting.  Abdominal distention.  CT ABDOMEN AND PELVIS WITH CONTRAST  Technique:  Multidetector CT imaging of the abdomen and pelvis was performed following the standard protocol during bolus administration of intravenous contrast.  Contrast: OMNIPAQUE IOHEXOL 300 MG/ML  SOLN  Comparison: 07/02/2012 radiographs; ultrasound of 04/21/2012  Findings: Postoperative findings noted at the gastroesophageal junction.  Nodular contour the liver is compatible with cirrhosis.  A 21.3 x 15.8 x 21.5 cm  pocket of fluid and gas is present anteriorly within the abdomen, with enhancing margins suspected for suspicious for a large abscess.  I am uncertain of the location of this collection with respect to the hernia mesh, although there is a curved sheet-like plane of gas density central within this collection and oriented coronal and which could represent gas along the hernia mesh; alternatively, the hernia mesh might be posterior to the collection.  However, the collection is thought to be deep to the abdominal wall musculature.  Scattered gas densities raise suspicion for multiloculated.  Additional small pockets of fluid are scattered in the abdomen but are not gas-filled.  Mild splenomegaly noted.  Intrahepatic portal vein branches appear attenuated.  Superior mesenteric vein is prominent and there are extensive varices below the right kidney  Abnormal wall thickening and mild dilatation of the distal duodenum and proximal jejunum noted.  Irregular nodularity / lobularity of the colon is present, especially proximally, and colitis is not excluded.  Fluid infiltration and mild nodularity along the omentum noted; some of this is likely vascular.  Mild retroperitoneal adenopathy noted, with a left periaortic node measuring 1.3 cm in short axis on image 24 series 2.  This adenopathy may well be reactive.  Urinary bladder unremarkable.  Renal enhancement appears normal. Pancreas and adrenal glands unremarkable.  Possible left unilateral pars defect at L5 noted.  Aortoiliac atherosclerotic vascular disease is present.  IMPRESSION:  1. Very large (21 cm in diameter) collection of gas and fluid in the anterior abdomen, with a flat sheet of gas internally within this structure, suspicious for a large abscess along the hernia mesh. This may be multiloculated given the differing vertical levels of gas within the abscess. 2.  Cirrhotic liver disease with considerable varices. 3.  Mild splenomegaly. 4.  Wall thickening and  mucosal enhancement in the colon, query colitis. 5.  Abnormal wall thickening and mild dilatation of the proximal small bowel, probably ileus secondary to the large abscess. 6.  Small pockets of fluid elsewhere in the abdomen are indeterminate with regard to whether they represent abscesses or simply ascites. 7.  Possible left unilateral pars defect at L5.  These results were called by telephone on 07/02/2012 at 9:13 a.m. to Dr. Derwood Kaplan, who verbally acknowledged these results.  Original Report Authenticated By: Dellia Cloud, M.D.   Dg Abd Acute W/chest  07/02/2012  *RADIOLOGY REPORT*  Clinical Data: Cirrhosis, hepatitis C, CHF, nausea, vomiting  ACUTE ABDOMEN SERIES (ABDOMEN 2 VIEW & CHEST 1 VIEW)  Comparison: 12/05/2011  Findings: Normal heart size and vascularity.  Clear lungs. Negative for pneumonia, CHF, effusion or pneumothorax.  Trachea midline.  No free air.  Scattered air and stool throughout the abdomen.  Negative for obstruction, ileus, dilatation, or free air. Increased density of the abdomen compatible with abdominal ascites. No acute osseous  finding.  IMPRESSION: No acute chest process  Negative for obstruction or dilatation  Suspect abdominal ascites  Original Report Authenticated By: Judie Petit. Ruel Favors, M.D.    Review of Systems  Constitutional: Positive for malaise/fatigue. Negative for fever, chills and diaphoresis.  HENT: Negative.   Eyes: Negative.   Respiratory: Negative.   Cardiovascular: Negative.   Gastrointestinal: Positive for nausea, vomiting and abdominal pain.  Genitourinary: Negative.   Musculoskeletal: Negative.   Skin:       Varices, redness on abdomen unchanged   Neurological: Negative.  Negative for weakness.  Endo/Heme/Allergies: Negative.   Psychiatric/Behavioral: Negative.     Blood pressure 133/61, pulse 80, temperature 99 F (37.2 C), temperature source Oral, resp. rate 15, SpO2 100.00%. Physical Exam  Constitutional: He is oriented to  person, place, and time. He appears distressed.       Very thin  HENT:  Head: Normocephalic and atraumatic.  Eyes: Conjunctivae are normal. Pupils are equal, round, and reactive to light. No scleral icterus.  Neck: Normal range of motion. No thyromegaly present.  Cardiovascular: Normal rate, regular rhythm and intact distal pulses.   No murmur heard. Respiratory: Effort normal and breath sounds normal. No respiratory distress. He exhibits no tenderness.  GI: Soft. He exhibits distension. There is tenderness. There is no rebound and no guarding.       Fluid filled hernia sac varices  Musculoskeletal: Normal range of motion. He exhibits no edema and no tenderness.  Lymphadenopathy:    He has no cervical adenopathy.  Neurological: He is alert and oriented to person, place, and time. Coordination normal.  Skin: Skin is warm and dry. He is not diaphoretic.  Psychiatric: He has a normal mood and affect. His behavior is normal. Judgment and thought content normal.     Assessment/Plan 1.  ? Abscess vs recurrent ascites around mesh. Plan IVF, IV antibiotics, probable IR consult for perc drain.  Will discuss with Dr. Magnus Ivan.  May have mesh infection and need mesh removal.  Recurrent ascites that has required removal is likely source of infection.   2.  Cirrhosis:   Lactulose if needed, spironolactone. Have asked for medicine evaluation to contribute to management of cirrhosis. 3.  N/V - Probable ileus vs enteritis/colitis secondary to large abscess.   Does not appear to be obstructed.  If n/v continues, would offer NGT.   4.  Pain control.    Franciso Dierks 07/02/2012, 11:18 AM

## 2012-07-02 NOTE — Progress Notes (Signed)
ANTIBIOTIC CONSULT NOTE - INITIAL  Pharmacy Consult for Vancomycin and Zosyn  Indication: abdom. Wall infection  No Known Allergies  Patient Measurements: Height: 5\' 11"  (180.3 cm) Weight: 183 lb (83.008 kg) IBW/kg (Calculated) : 75.3  Adjusted Body Weight:   Vital Signs: Temp: 99 F (37.2 C) (07/20 0702) Temp src: Oral (07/20 0702) BP: 125/53 mmHg (07/20 1418) Pulse Rate: 63  (07/20 1418) Intake/Output from previous day:   Intake/Output from this shift:    Labs:  Basename 07/02/12 0720  WBC 10.6*  HGB 10.4*  PLT 226  LABCREA --  CREATININE 0.97   Estimated Creatinine Clearance: 90.6 ml/min (by C-G formula based on Cr of 0.97). No results found for this basename: VANCOTROUGH:2,VANCOPEAK:2,VANCORANDOM:2,GENTTROUGH:2,GENTPEAK:2,GENTRANDOM:2,TOBRATROUGH:2,TOBRAPEAK:2,TOBRARND:2,AMIKACINPEAK:2,AMIKACINTROU:2,AMIKACIN:2, in the last 72 hours   Microbiology: No results found for this or any previous visit (from the past 720 hour(s)).  Medical History: Past Medical History  Diagnosis Date  . Cirrhosis of liver   . Hepatitis C 03-28-12    ? 80's-prev. IV drug abuse  . 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 Blood transfusion 03-28-12    '81-hx. of bleeding ulcer  . History of bleeding ulcers 03-28-12    Hx. '81  . GERD (gastroesophageal reflux disease) 03-26-12    Hx. reflux. ulcers in past, occ. OTC med    Medications:  Anti-infectives     Start     Dose/Rate Route Frequency Ordered Stop   07/02/12 2200  piperacillin-tazobactam (ZOSYN) IVPB 3.375 g       3.375 g 12.5 mL/hr over 240 Minutes Intravenous 3 times per day 07/02/12 1416     07/02/12 1800   vancomycin (VANCOCIN) IVPB 1000 mg/200 mL premix        1,000 mg 200 mL/hr over 60 Minutes Intravenous Every 8 hours 07/02/12 1419     07/02/12 1200  piperacillin-tazobactam (ZOSYN) IVPB 3.375 g       3.375 g 100 mL/hr over 30 Minutes Intravenous  Once 07/02/12 1150     07/02/12 0945    cefTRIAXone (ROCEPHIN) 2 g in dextrose 5 % 50 mL IVPB        2 g 100 mL/hr over 30 Minutes Intravenous  Once 07/02/12 0939 07/02/12 1115   07/02/12 0930   vancomycin (VANCOCIN) injection 1 g  Status:  Discontinued        1 g Intravenous  Once 07/02/12 0926 07/02/12 0928   07/02/12 0930   vancomycin (VANCOCIN) IVPB 1000 mg/200 mL premix        1,000 mg 200 mL/hr over 60 Minutes Intravenous  Once 07/02/12 4098 07/02/12 1045         Assessment: Patient with abdom. wal infection.  First dose of antibiotics already given in ED.  Goal of Therapy:  Vancomycin trough level 15-20 mcg/ml Zosyn based on renal function   Plan:  Measure antibiotic drug levels at steady state Follow up culture results vancomycin 1gm iv q8hr Zosyn 3.375g IV Q8H infused over 4hrs.   Darlina Guys, Jacquenette Shone Crowford 07/02/2012,2:20 PM

## 2012-07-02 NOTE — Progress Notes (Signed)
Critical value received:  ABUNDANT WBC PRESENT, PREDOMINANTLY PMN NO SQUAMOUS EPITHELIAL CELLS SEEN FEW GRAM POSITIVE COCCI IN CLUSTERS CRITICAL RESULT CALLED TO, READ BACK BY AND VERIFIED WITH: CRITICAL RESULT CALLED TO, READ BACK BY AND   Date of notification:  07/02/2012  Time of notification:  1849  Critical value read back:yes  Nurse who received alert:  Patty   MD notified (1st page):  Dr Donell Beers   Time of first page:  1852  MD notified (2nd page):  Time of second page:  Responding MD:    Time MD responded:

## 2012-07-02 NOTE — Procedures (Signed)
14 and 12 fr abscess drains above and deep to anterior abdominal mesh 1725 cc exudative fluid total removed No comp Stable Keep to gravity bags Full report in pacs Gs/cx sent

## 2012-07-03 DIAGNOSIS — R188 Other ascites: Secondary | ICD-10-CM | POA: Diagnosis present

## 2012-07-03 DIAGNOSIS — D649 Anemia, unspecified: Secondary | ICD-10-CM | POA: Diagnosis present

## 2012-07-03 DIAGNOSIS — E871 Hypo-osmolality and hyponatremia: Secondary | ICD-10-CM | POA: Diagnosis present

## 2012-07-03 LAB — COMPREHENSIVE METABOLIC PANEL
ALT: 11 U/L (ref 0–53)
Alkaline Phosphatase: 62 U/L (ref 39–117)
BUN: 18 mg/dL (ref 6–23)
Chloride: 93 mEq/L — ABNORMAL LOW (ref 96–112)
GFR calc Af Amer: 81 mL/min — ABNORMAL LOW (ref >90)
Glucose, Bld: 120 mg/dL — ABNORMAL HIGH (ref 70–99)
Potassium: 3.8 mEq/L (ref 3.5–5.1)
Sodium: 131 mEq/L — ABNORMAL LOW (ref 135–145)
Total Bilirubin: 0.9 mg/dL (ref 0.3–1.2)

## 2012-07-03 LAB — CBC
HCT: 30.7 % — ABNORMAL LOW (ref 39.0–52.0)
Hemoglobin: 10 g/dL — ABNORMAL LOW (ref 13.0–17.0)
RBC: 3.52 MIL/uL — ABNORMAL LOW (ref 4.22–5.81)
WBC: 9.1 10*3/uL (ref 4.0–10.5)

## 2012-07-03 LAB — PROTIME-INR: Prothrombin Time: 19.3 seconds — ABNORMAL HIGH (ref 11.6–15.2)

## 2012-07-03 MED ORDER — MAGIC MOUTHWASH
15.0000 mL | Freq: Four times a day (QID) | ORAL | Status: DC | PRN
  Filled 2012-07-03: qty 15

## 2012-07-03 MED ORDER — ADULT MULTIVITAMIN W/MINERALS CH
1.0000 | ORAL_TABLET | Freq: Every day | ORAL | Status: DC
  Administered 2012-07-03 – 2012-07-10 (×8): 1 via ORAL
  Filled 2012-07-03 (×9): qty 1

## 2012-07-03 MED ORDER — SODIUM CHLORIDE 0.9 % IJ SOLN
3.0000 mL | Freq: Two times a day (BID) | INTRAMUSCULAR | Status: DC
  Administered 2012-07-06 – 2012-07-10 (×7): 3 mL via INTRAVENOUS

## 2012-07-03 MED ORDER — SACCHAROMYCES BOULARDII 250 MG PO CAPS
250.0000 mg | ORAL_CAPSULE | Freq: Two times a day (BID) | ORAL | Status: DC
  Administered 2012-07-03 – 2012-07-10 (×15): 250 mg via ORAL
  Filled 2012-07-03 (×18): qty 1

## 2012-07-03 MED ORDER — SODIUM CHLORIDE 0.9 % IV SOLN: 250.0000 mL | INTRAVENOUS | Status: DC | PRN

## 2012-07-03 MED ORDER — HYDROMORPHONE HCL 4 MG PO TABS
4.0000 mg | ORAL_TABLET | ORAL | Status: DC | PRN
  Administered 2012-07-05 – 2012-07-11 (×22): 4 mg via ORAL
  Filled 2012-07-03 (×22): qty 1

## 2012-07-03 MED ORDER — PSYLLIUM 95 % PO PACK
1.0000 | PACK | Freq: Two times a day (BID) | ORAL | Status: DC
  Administered 2012-07-03 – 2012-07-10 (×13): 1 via ORAL
  Filled 2012-07-03 (×18): qty 1

## 2012-07-03 MED ORDER — ALUM & MAG HYDROXIDE-SIMETH 200-200-20 MG/5ML PO SUSP: 30.0000 mL | Freq: Four times a day (QID) | ORAL | Status: DC | PRN

## 2012-07-03 MED ORDER — BOOST / RESOURCE BREEZE PO LIQD
1.0000 | Freq: Three times a day (TID) | ORAL | Status: DC
  Administered 2012-07-04 (×2): 1 via ORAL

## 2012-07-03 MED ORDER — OXYCODONE HCL 5 MG PO TABS
5.0000 mg | ORAL_TABLET | ORAL | Status: DC | PRN
  Administered 2012-07-04: 5 mg via ORAL
  Administered 2012-07-04 (×2): 10 mg via ORAL
  Administered 2012-07-04 (×2): 5 mg via ORAL
  Administered 2012-07-04 – 2012-07-05 (×2): 10 mg via ORAL
  Filled 2012-07-03 (×4): qty 2
  Filled 2012-07-03: qty 1
  Filled 2012-07-03 (×2): qty 2
  Filled 2012-07-03: qty 1

## 2012-07-03 MED ORDER — PANTOPRAZOLE SODIUM 40 MG PO TBEC
40.0000 mg | DELAYED_RELEASE_TABLET | Freq: Every day | ORAL | Status: DC
  Administered 2012-07-03 – 2012-07-10 (×8): 40 mg via ORAL
  Filled 2012-07-03 (×10): qty 1

## 2012-07-03 MED ORDER — SODIUM CHLORIDE 0.9 % IJ SOLN: 3.0000 mL | INTRAMUSCULAR | Status: DC | PRN

## 2012-07-03 MED ORDER — NAPROXEN 250 MG PO TABS
500.0000 mg | ORAL_TABLET | Freq: Two times a day (BID) | ORAL | Status: DC | PRN
  Filled 2012-07-03: qty 2

## 2012-07-03 NOTE — Progress Notes (Signed)
INITIAL ADULT NUTRITION ASSESSMENT Date: 07/03/2012   Time: 12:41 PM Reason for Assessment: Consult  ASSESSMENT: Male 56 y.o.  Dx: Seroma, postoperative  Past Medical History  Diagnosis Date  . Cirrhosis of liver   . Hepatitis C 03-28-12    ? 80's-prev. IV drug abuse  . 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 Blood transfusion 03-28-12    '81-hx. of bleeding ulcer  . History of bleeding ulcers 03-28-12    Hx. '81  . GERD (gastroesophageal reflux disease) 03-26-12    Hx. reflux. ulcers in past, occ. OTC med    Scheduled Meds:   . bumetanide  2 mg Oral Daily  . fentaNYL      . fentaNYL      . midazolam      . multivitamin with minerals  1 tablet Oral Daily  . pantoprazole  40 mg Oral Q1200  . piperacillin-tazobactam  3.375 g Intravenous Once  . piperacillin-tazobactam (ZOSYN)  IV  3.375 g Intravenous Q8H  . psyllium  1 packet Oral BID  . saccharomyces boulardii  250 mg Oral BID  . sodium chloride  3 mL Intravenous Q12H  . spironolactone  100 mg Oral QAC breakfast  . vancomycin  1,000 mg Intravenous Q8H  . DISCONTD: pantoprazole (PROTONIX) IV  40 mg Intravenous QHS   Continuous Infusions:   . 0.9 % NaCl with KCl 20 mEq / L 50 mL/hr at 07/03/12 0936   PRN Meds:.sodium chloride, alum & mag hydroxide-simeth, diphenhydrAMINE, diphenhydrAMINE, fentaNYL, HYDROmorphone (DILAUDID) injection, HYDROmorphone, magic mouthwash, midazolam, naproxen, ondansetron, oxyCODONE, sodium chloride   Ht: 5\' 11"  (180.3 cm)  Wt: 162 lb 12.8 oz (73.846 kg)  Ideal Wt: 78.2 kg  % Ideal Wt: 94.4%  Usual Wt: 183-185 lb, per pt; 11.5% weight loss, which may be partly due to ascites drainage % Usual Wt: 88.5%  Body mass index is 22.71 kg/(m^2).  Food/Nutrition Related Hx:  Reported abdominal pain, N/V x 2 days PTA, but reports no weight loss. Pt stated prior to those 2 days of not being able to keep anything down, his appetite was good and eating well. Pt reports good  appetite now and said he feels like he could eat some solid food. Pt willing to try Resource Breeze while on clear liquid diet.  Labs:  CMP     Component Value Date/Time   NA 131* 07/03/2012 0440   K 3.8 07/03/2012 0440   CL 93* 07/03/2012 0440   CO2 30 07/03/2012 0440   GLUCOSE 120* 07/03/2012 0440   BUN 18 07/03/2012 0440   CREATININE 1.14 07/03/2012 0440   CREATININE 0.84 07/20/2011 0000   CALCIUM 8.4 07/03/2012 0440   PROT 7.1 07/03/2012 0440   ALBUMIN 1.7* 07/03/2012 0440   AST 24 07/03/2012 0440   ALT 11 07/03/2012 0440   ALKPHOS 62 07/03/2012 0440   BILITOT 0.9 07/03/2012 0440   GFRNONAA 70* 07/03/2012 0440   GFRAA 81* 07/03/2012 0440    Intake/Output Summary (Last 24 hours) at 07/03/12 1242 Last data filed at 07/03/12 1144  Gross per 24 hour  Intake 2964.99 ml  Output   2800 ml  Net 164.99 ml    Diet Order: Clear Liquid (100% PO intake documented today)  Supplements/Tube Feeding: None at this time.  IVF:    0.9 % NaCl with KCl 20 mEq / L Last Rate: 50 mL/hr at 07/03/12 0936    Estimated Nutritional Needs:   Kcal: 2400- 2600 kcal Protein: 80-95 gm Fluid: >  2.4 L  NUTRITION DIAGNOSIS: -Inadequate oral intake (NI-2.1).  Status: Ongoing  RELATED TO: abdominal pain, N/V  AS EVIDENCE BY: 88.5% of reported UBW  MONITORING/EVALUATION(Goals): Goal: Pt to tolerate diet advancement with >75% PO intake at meals. Monitor: diet advancement, PO intake, weight, labs  EDUCATION NEEDS: -No education needs identified at this time  INTERVENTION: Encourage PO intake at meals. Will order Resource Breeze TID with meals, which provides 750 kcal and 27 gm protein daily.   DOCUMENTATION CODES Per approved criteria  -Non-severe (moderate) malnutrition in the context of acute illness or injury    [name] 07/03/2012, 12:41 PM

## 2012-07-03 NOTE — Progress Notes (Addendum)
RANSON BELLUOMINI [id number] [birth year]  CARE TEAM:  PCP: Iona Hansen, NP  Outpatient Care Team: Patient Care Team: Iona Hansen as PCP - General (Nurse Practitioner)  Inpatient Treatment Team: Treatment Team: Attending Provider: Shelly Rubenstein, MD; Consulting Physician: Maryruth Bun Rama, MD; Rounding Team: Md Montez Morita, MD; Rounding Team: Alton Revere, MD; Technician: Melvenia Beam, NT  Subjective:  Feels less pressure w drains placed No emesis  Objective:  Vital signs:  Filed Vitals:   07/02/12 1600 07/02/12 2130 07/03/12 0200 07/03/12 0540  BP: 113/55 131/65 128/56 124/58  Pulse: 68 76 77 75  Temp: 97.8 F (36.6 C) 99.2 F (37.3 C) 98.7 F (37.1 C) 98.7 F (37.1 C)  TempSrc: Oral Oral Oral Oral  Resp: 16 18 18 18   Height: 5\' 11"  (1.803 m)     Weight: 162 lb 12.8 oz (73.846 kg)     SpO2: 100% 96% 97% 97%    Last BM Date: 07/02/12  Intake/Output   Yesterday:  07/20 0701 - 07/21 0700 In: 2146.7 [I.V.:1676.7; IV Piggyback:450] Out: 1625 [Urine:1300; Drains:325] This shift:     Bowel function:  Flatus: ?a little  BM: n  Drains: serous/serobilious  Physical Exam:  General: Pt awake/alert/oriented x4 in no acute distress Eyes: PERRL, normal EOM.  Sclera clear.  No icterus Neuro: CN II-XII intact w/o focal sensory/motor deficits. Lymph: No head/neck/groin lymphadenopathy Psych:  No delerium/psychosis/paranoia HENT: Normocephalic, Mucus membranes moist.  No thrush Neck: Supple, No tracheal deviation Chest: No chest wall pain w good excursion CV:  Pulses intact.  Regular rhythm Abdomen: Soft.  Mildly distended.  Mild/mod tender mid abdomen.  No peritonitis.  No incarcerated hernias. Ext:  SCDs BLE.  No mjr edema.  No cyanosis Skin: No petechiae / purpurae  Problem List:  Principal Problem:  *Seroma, postoperative, possible abscess Active Problems:  Cirrhosis of liver with history of hepatitis C  Hypokalemia  Severe malnutrition   Ascites   Assessment  Brit Wernette Fearnow  56 y.o. male       More stable s/p drainage of postoperative seroma in cirrhotic pt w chronic ascites  Plan:  -IV ABX -f/u cultures -retry PO diet -try to salvage mesh w h/o cirrhosis & increased operative risk, but a challenge if infected.  Now 3 months out -VTE prophylaxis- SCDs, etc -mobilize as tolerated to help recovery -stop smoking  Ardeth Sportsman, M.D., F.A.C.S. Gastrointestinal and [facility]imally [facility]vasive [facility] [address] [place], [place] [address] [phone] Main / Paging [phone] Voice Mail   07/03/2012  Results:   Labs: Results for orders placed during the hospital encounter of 07/02/12 (from the past 48 hour(s))  APTT     Status: Normal   Collection Time   07/02/12  7:20 AM      Component Value Range Comment   aPTT 37  24 - 37 seconds   BASIC METABOLIC PANEL     Status: Abnormal   Collection Time   07/02/12  7:20 AM      Component Value Range Comment   Sodium 129 (*) 135 - 145 mEq/L    Potassium 3.4 (*) 3.5 - 5.1 mEq/L    Chloride 89 (*) 96 - 112 mEq/L    CO2 29  19 - 32 mEq/L    Glucose, Bld 138 (*) 70 - 99 mg/dL    BUN 16  6 - 23 mg/dL    Creatinine, Ser 6.44  0.50 - 1.35 mg/dL  Calcium 9.1  8.4 - 10.5 mg/dL    GFR calc non Af Amer >90  >90 mL/min    GFR calc Af Amer >90  >90 mL/min   CBC WITH DIFFERENTIAL     Status: Abnormal   Collection Time   07/02/12  7:20 AM      Component Value Range Comment   WBC 10.6 (*) 4.0 - 10.5 K/uL    RBC 3.60 (*) 4.22 - 5.81 MIL/uL    Hemoglobin 10.4 (*) 13.0 - 17.0 g/dL    HCT 16.1 (*) 09.6 - 52.0 %    MCV 85.8  78.0 - 100.0 fL    MCH 28.9  26.0 - 34.0 pg    MCHC 33.7  30.0 - 36.0 g/dL    RDW 04.5 (*) 40.9 - 15.5 %    Platelets 226  150 - 400 K/uL    Neutrophils Relative 82 (*) 43 - 77 %    Neutro Abs 8.7 (*) 1.7 - 7.7 K/uL    Lymphocytes Relative 8 (*) 12 - 46 %    Lymphs Abs 0.8  0.7 - 4.0 K/uL     Monocytes Relative 10  3 - 12 %    Monocytes Absolute 1.1 (*) 0.1 - 1.0 K/uL    Eosinophils Relative 0  0 - 5 %    Eosinophils Absolute 0.0  0.0 - 0.7 K/uL    Basophils Relative 0  0 - 1 %    Basophils Absolute 0.0  0.0 - 0.1 K/uL   LIPASE, BLOOD     Status: Normal   Collection Time   07/02/12  7:20 AM      Component Value Range Comment   Lipase 21  11 - 59 U/L   HEPATIC FUNCTION PANEL     Status: Abnormal   Collection Time   07/02/12  7:20 AM      Component Value Range Comment   Total Protein 7.8  6.0 - 8.3 g/dL    Albumin 2.1 (*) 3.5 - 5.2 g/dL    AST 29  0 - 37 U/L    ALT 14  0 - 53 U/L    Alkaline Phosphatase 76  39 - 117 U/L    Total Bilirubin 1.2  0.3 - 1.2 mg/dL    Bilirubin, Direct 0.3  0.0 - 0.3 mg/dL    Indirect Bilirubin 0.9  0.3 - 0.9 mg/dL   PROTIME-INR     Status: Abnormal   Collection Time   07/02/12  7:20 AM      Component Value Range Comment   Prothrombin Time 18.9 (*) 11.6 - 15.2 seconds    INR 1.55 (*) 0.00 - 1.49   LACTIC ACID, PLASMA     Status: Normal   Collection Time   07/02/12  7:20 AM      Component Value Range Comment   Lactic Acid, Venous 2.1  0.5 - 2.2 mmol/L   URINALYSIS, ROUTINE W REFLEX MICROSCOPIC     Status: Abnormal   Collection Time   07/02/12 10:09 AM      Component Value Range Comment   Color, Urine AMBER (*) YELLOW BIOCHEMICALS MAY BE AFFECTED BY COLOR   APPearance CLEAR  CLEAR    Specific Gravity, Urine >1.046 (*) 1.005 - 1.030    pH 6.0  5.0 - 8.0    Glucose, UA NEGATIVE  NEGATIVE mg/dL    Hgb urine dipstick NEGATIVE  NEGATIVE    Bilirubin Urine SMALL (*) NEGATIVE    Ketones,  ur NEGATIVE  NEGATIVE mg/dL    Protein, ur NEGATIVE  NEGATIVE mg/dL    Urobilinogen, UA 0.2  0.0 - 1.0 mg/dL    Nitrite NEGATIVE  NEGATIVE    Leukocytes, UA NEGATIVE  NEGATIVE MICROSCOPIC NOT DONE ON URINES WITH NEGATIVE PROTEIN, BLOOD, LEUKOCYTES, NITRITE, OR GLUCOSE <1000 mg/dL.  LACTATE DEHYDROGENASE, BODY FLUID     Status: Abnormal   Collection Time    07/02/12 10:55 AM      Component Value Range Comment   LD, Fluid 6908 (*) 3 - 23 U/L RESULTS CONFIRMED BY MANUAL DILUTION   Fluid Type-FLDH PERITONEAL CAVITY     GLUCOSE, PERITONEAL FLUID     Status: Normal   Collection Time   07/02/12 10:55 AM      Component Value Range Comment   Glucose, Peritoneal Fluid 1   NO NORMAL RANGE ESTABLISHED FOR THIS TEST  PROTEIN, BODY FLUID     Status: Normal   Collection Time   07/02/12 10:55 AM      Component Value Range Comment   Total protein, fluid 3.6   NO NORMAL RANGE ESTABLISHED FOR THIS TEST   Fluid Type-FTP PERITONEAL CAVITY     ALBUMIN, FLUID     Status: Normal   Collection Time   07/02/12 10:55 AM      Component Value Range Comment   Albumin, Fluid 1.1   NO NORMAL RANGE ESTABLISHED FOR THIS TEST   Fluid Type-FALB PERITONEAL CAVITY     CELL COUNT + DIFF,  W/ CRYST-SYNVL FLD     Status: Abnormal   Collection Time   07/02/12 10:55 AM      Component Value Range Comment   Color, Synovial AMBER (*) YELLOW    Appearance-Synovial TURBID (*) CLEAR    Crystals, Fluid NO CRYSTALS SEEN      WBC, Synovial 1538 (*) 0 - 200 /cu mm    Neutrophil, Synovial 96 (*) 0 - 25 %    Lymphocytes-Synovial Fld 4  0 - 20 %    Monocyte-Macrophage-Synovial Fluid 0 (*) 50 - 90 %    Eosinophils-Synovial 0  0 - 1 %    Other Cells-SYN ABUNDANT DEGENERATED CELLS SEEN     CULTURE, ROUTINE-ABSCESS     Status: Normal (Preliminary result)   Collection Time   07/02/12  3:21 PM      Component Value Range Comment   Specimen Description DRAINAGE      Special Requests Normal      Gram Stain        Value: ABUNDANT WBC PRESENT, PREDOMINANTLY PMN     NO SQUAMOUS EPITHELIAL CELLS SEEN     FEW GRAM POSITIVE COCCI IN CLUSTERS     CRITICAL RESULT CALLED TO, READ BACK BY AND VERIFIED WITH: CRITICAL RESULT CALLED TO, READ BACK BY AND VERIFIED WITH: PATTY CORBITT 6:45PM 07/02/12 BY RUSCA.   Culture Culture reincubated for better growth      Report Status PENDING     CBC     Status:  Abnormal   Collection Time   07/03/12  4:40 AM      Component Value Range Comment   WBC 9.1  4.0 - 10.5 K/uL    RBC 3.52 (*) 4.22 - 5.81 MIL/uL    Hemoglobin 10.0 (*) 13.0 - 17.0 g/dL    HCT 11.9 (*) 14.7 - 52.0 %    MCV 87.2  78.0 - 100.0 fL    MCH 28.4  26.0 - 34.0 pg    MCHC  32.6  30.0 - 36.0 g/dL    RDW 78.2 (*) 95.6 - 15.5 %    Platelets 211  150 - 400 K/uL   COMPREHENSIVE METABOLIC PANEL     Status: Abnormal   Collection Time   07/03/12  4:40 AM      Component Value Range Comment   Sodium 131 (*) 135 - 145 mEq/L    Potassium 3.8  3.5 - 5.1 mEq/L    Chloride 93 (*) 96 - 112 mEq/L    CO2 30  19 - 32 mEq/L    Glucose, Bld 120 (*) 70 - 99 mg/dL    BUN 18  6 - 23 mg/dL    Creatinine, Ser 2.13  0.50 - 1.35 mg/dL    Calcium 8.4  8.4 - 08.6 mg/dL    Total Protein 7.1  6.0 - 8.3 g/dL    Albumin 1.7 (*) 3.5 - 5.2 g/dL    AST 24  0 - 37 U/L    ALT 11  0 - 53 U/L    Alkaline Phosphatase 62  39 - 117 U/L    Total Bilirubin 0.9  0.3 - 1.2 mg/dL    GFR calc non Af Amer 70 (*) >90 mL/min    GFR calc Af Amer 81 (*) >90 mL/min   PROTIME-INR     Status: Abnormal   Collection Time   07/03/12  4:40 AM      Component Value Range Comment   Prothrombin Time 19.3 (*) 11.6 - 15.2 seconds    INR 1.60 (*) 0.00 - 1.49   APTT     Status: Abnormal   Collection Time   07/03/12  4:40 AM      Component Value Range Comment   aPTT 38 (*) 24 - 37 seconds     Imaging / Studies: Ct Guided Abscess Drain  07/02/2012  *RADIOLOGY REPORT*  Clinical Data: Abdominal wall and intraperitoneal abscess, history of ventral hernia repair with mesh placement.  CT GUIDED INTRA-ABDOMINAL PERITONEAL ABSCESS DRAIN INSERTION CT GUIDED SUPERFICIAL ABDOMINAL WALL ABSCESS DRAIN INSERTION  Date:  07/02/2012 12:35:00  Radiologist:  M. Ruel Favors, M.D.  Medications:  4 mg Versed, 200 mcg Fentanyl  Guidance:  CT  Fluoroscopy time:  None.  Sedation time:  45 minutes  Contrast volume:  None.  Complications:  No immediate   PROCEDURE/FINDINGS:  Informed consent was obtained from the patient following explanation of the procedure, risks, benefits and alternatives. The patient understands, agrees and consents for the procedure. All questions were addressed.  A time out was performed.  Maximal barrier sterile technique utilized including caps, mask, sterile gowns, sterile gloves, large sterile drape, hand hygiene, and betadine  Previous imaging reviewed. The patient was positioned supine. Noncontrast localization CT performed.  The larger intraperitoneal abscess collection deep to the abdominal mesh was first localized. Under sterile conditions and local anesthesia, a 17 gauge access needle was advanced from a right lower quadrant oblique approach into the deep larger abdominal abscess.  Needle position confirmed with CT.  Syringe aspiration yielded exudative fluid.  Samples sent for gram stain and culture.  Amplatz guide wire inserted followed by tract dilatation to advance a 14-French drain.  Drain catheter position confirmed with CT.  Vacutainer suction yielded 1000-50 ml of exudative fluid.  In a similar fashion, the superficial abdominal wall fluid collection was localized.  Under sterile conditions and local anesthesia, a 17 gauge access needle was advanced superficially into the second abscess collection.  Needle position confirmed  with CT.  Syringe aspiration yielded a similar appearing exudative fluid.  Guide wire advanced followed by tract dilatation to insert a 12-French drain.  Syringe aspiration yielded 375 ml of fluid.  Drain catheter secured a Prolene sutures and connected to external gravity drainage.  IMPRESSION: Successful 14-French intra-abdominal abscess drain insertion deep to the abdominal mesh.  Successful 12-French superficial abdominal wall abscess drain insertion anterior to the abdominal mesh  Original Report Authenticated By: Judie Petit. Ruel Favors, M.D.   Ct Guided Abscess Drain  07/02/2012  *RADIOLOGY REPORT*   Clinical Data: Abdominal wall and intraperitoneal abscess, history of ventral hernia repair with mesh placement.  CT GUIDED INTRA-ABDOMINAL PERITONEAL ABSCESS DRAIN INSERTION CT GUIDED SUPERFICIAL ABDOMINAL WALL ABSCESS DRAIN INSERTION  Date:  07/02/2012 12:35:00  Radiologist:  M. Ruel Favors, M.D.  Medications:  4 mg Versed, 200 mcg Fentanyl  Guidance:  CT  Fluoroscopy time:  None.  Sedation time:  45 minutes  Contrast volume:  None.  Complications:  No immediate  PROCEDURE/FINDINGS:  Informed consent was obtained from the patient following explanation of the procedure, risks, benefits and alternatives. The patient understands, agrees and consents for the procedure. All questions were addressed.  A time out was performed.  Maximal barrier sterile technique utilized including caps, mask, sterile gowns, sterile gloves, large sterile drape, hand hygiene, and betadine  Previous imaging reviewed. The patient was positioned supine. Noncontrast localization CT performed.  The larger intraperitoneal abscess collection deep to the abdominal mesh was first localized. Under sterile conditions and local anesthesia, a 17 gauge access needle was advanced from a right lower quadrant oblique approach into the deep larger abdominal abscess.  Needle position confirmed with CT.  Syringe aspiration yielded exudative fluid.  Samples sent for gram stain and culture.  Amplatz guide wire inserted followed by tract dilatation to advance a 14-French drain.  Drain catheter position confirmed with CT.  Vacutainer suction yielded 1000-50 ml of exudative fluid.  In a similar fashion, the superficial abdominal wall fluid collection was localized.  Under sterile conditions and local anesthesia, a 17 gauge access needle was advanced superficially into the second abscess collection.  Needle position confirmed with CT.  Syringe aspiration yielded a similar appearing exudative fluid.  Guide wire advanced followed by tract dilatation to insert a  12-French drain.  Syringe aspiration yielded 375 ml of fluid.  Drain catheter secured a Prolene sutures and connected to external gravity drainage.  IMPRESSION: Successful 14-French intra-abdominal abscess drain insertion deep to the abdominal mesh.  Successful 12-French superficial abdominal wall abscess drain insertion anterior to the abdominal mesh  Original Report Authenticated By: Judie Petit. Ruel Favors, M.D.   Ct Abdomen Pelvis W Contrast  07/02/2012  *RADIOLOGY REPORT*  Clinical Data: Abdominal pain and nausea.  Vomiting.  Abdominal distention.  CT ABDOMEN AND PELVIS WITH CONTRAST  Technique:  Multidetector CT imaging of the abdomen and pelvis was performed following the standard protocol during bolus administration of intravenous contrast.  Contrast: OMNIPAQUE IOHEXOL 300 MG/ML  SOLN  Comparison: 07/02/2012 radiographs; ultrasound of 04/21/2012  Findings: Postoperative findings noted at the gastroesophageal junction.  Nodular contour the liver is compatible with cirrhosis.  A 21.3 x 15.8 x 21.5 cm pocket of fluid and gas is present anteriorly within the abdomen, with enhancing margins suspected for suspicious for a large abscess.  I am uncertain of the location of this collection with respect to the hernia mesh, although there is a curved sheet-like plane of gas density central within this collection and oriented  coronal and which could represent gas along the hernia mesh; alternatively, the hernia mesh might be posterior to the collection.  However, the collection is thought to be deep to the abdominal wall musculature.  Scattered gas densities raise suspicion for multiloculated.  Additional small pockets of fluid are scattered in the abdomen but are not gas-filled.  Mild splenomegaly noted.  Intrahepatic portal vein branches appear attenuated.  Superior mesenteric vein is prominent and there are extensive varices below the right kidney  Abnormal wall thickening and mild dilatation of the distal duodenum  and proximal jejunum noted.  Irregular nodularity / lobularity of the colon is present, especially proximally, and colitis is not excluded.  Fluid infiltration and mild nodularity along the omentum noted; some of this is likely vascular.  Mild retroperitoneal adenopathy noted, with a left periaortic node measuring 1.3 cm in short axis on image 24 series 2.  This adenopathy may well be reactive.  Urinary bladder unremarkable.  Renal enhancement appears normal. Pancreas and adrenal glands unremarkable.  Possible left unilateral pars defect at L5 noted.  Aortoiliac atherosclerotic vascular disease is present.  IMPRESSION:  1. Very large (21 cm in diameter) collection of gas and fluid in the anterior abdomen, with a flat sheet of gas internally within this structure, suspicious for a large abscess along the hernia mesh. This may be multiloculated given the differing vertical levels of gas within the abscess. 2.  Cirrhotic liver disease with considerable varices. 3.  Mild splenomegaly. 4.  Wall thickening and mucosal enhancement in the colon, query colitis. 5.  Abnormal wall thickening and mild dilatation of the proximal small bowel, probably ileus secondary to the large abscess. 6.  Small pockets of fluid elsewhere in the abdomen are indeterminate with regard to whether they represent abscesses or simply ascites. 7.  Possible left unilateral pars defect at L5.  These results were called by telephone on 07/02/2012 at 9:13 a.m. to Dr. Derwood Kaplan, who verbally acknowledged these results.  Original Report Authenticated By: Dellia Cloud, M.D.   Dg Abd Acute W/chest  07/02/2012  *RADIOLOGY REPORT*  Clinical Data: Cirrhosis, hepatitis C, CHF, nausea, vomiting  ACUTE ABDOMEN SERIES (ABDOMEN 2 VIEW & CHEST 1 VIEW)  Comparison: 12/05/2011  Findings: Normal heart size and vascularity.  Clear lungs. Negative for pneumonia, CHF, effusion or pneumothorax.  Trachea midline.  No free air.  Scattered air and stool  throughout the abdomen.  Negative for obstruction, ileus, dilatation, or free air. Increased density of the abdomen compatible with abdominal ascites. No acute osseous finding.  IMPRESSION: No acute chest process  Negative for obstruction or dilatation  Suspect abdominal ascites  Original Report Authenticated By: Judie Petit. Ruel Favors, M.D.    Medications / Allergies: per chart  Antibiotics: Anti-infectives     Start     Dose/Rate Route Frequency Ordered Stop   07/02/12 2200  piperacillin-tazobactam (ZOSYN) IVPB 3.375 g       3.375 g 12.5 mL/hr over 240 Minutes Intravenous 3 times per day 07/02/12 1416     07/02/12 1800   vancomycin (VANCOCIN) IVPB 1000 mg/200 mL premix        1,000 mg 200 mL/hr over 60 Minutes Intravenous Every 8 hours 07/02/12 1419     07/02/12 1200  piperacillin-tazobactam (ZOSYN) IVPB 3.375 g       3.375 g 100 mL/hr over 30 Minutes Intravenous  Once 07/02/12 1150     07/02/12 0945   cefTRIAXone (ROCEPHIN) 2 g in dextrose 5 % 50 mL IVPB  2 g 100 mL/hr over 30 Minutes Intravenous  Once 07/02/12 0939 07/02/12 1115   07/02/12 0930   vancomycin (VANCOCIN) injection 1 g  Status:  Discontinued        1 g Intravenous  Once 07/02/12 0926 07/02/12 0928   07/02/12 0930   vancomycin (VANCOCIN) IVPB 1000 mg/200 mL premix        1,000 mg 200 mL/hr over 60 Minutes Intravenous  Once 07/02/12 0929 07/02/12 1045

## 2012-07-03 NOTE — Progress Notes (Signed)
[facility] PROGRESS NOTE  Noah Medina WUJ:[id number] DOB: [birth year] DOA: 07/02/2012 PCP: Iona Hansen, NP  Assessment/Plan: Principal Problem:  *Seroma, postoperative, possible abscess  Admitted by surgery. Percutaneous drainage done by IR 07/02/12.   Diagnostic paracentesis done by EDP. Follow up cultures. Active Problems:  Hyponatremia  Secondary to cirrhosis physiology.  Na+ stable. Normocytic anemia  Likely AOCD.  Hemoglobin stable. 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 LFTs not elevated.   Continue diuretics.   Coags show slight impairment of synthetic function. Hypokalemia   Potassium added to IVF. Severe malnutrition   Dietician evaluation requested.   Brief narrative: Noah Medina is a 56 year old male with a PMH of ventral hernia (repaired surgically 4/13), and cirrhosis who presents with 2 days of nausea, bilious emesis, and increased abdominal pain.  Imaging studies done in the ER showed a recurrent seroma, which was drained by IR 07/02/12. IM was asked to consult for management of the patient's cirrhosis.  Procedures:  CT guided abscess drain 07/02/12 by Dr. Miles Costain.  Antibiotics: Anti-infectives     Start     Dose/Rate Route Frequency Ordered Stop   07/02/12 2200  piperacillin-tazobactam (ZOSYN) IVPB 3.375 g       3.375 g 12.5 mL/hr over 240 Minutes Intravenous 3 times per day 07/02/12 1416     07/02/12 1800   vancomycin (VANCOCIN) IVPB 1000 mg/200 mL premix        1,000 mg 200 mL/hr over 60 Minutes Intravenous Every 8 hours 07/02/12 1419     07/02/12 1200  piperacillin-tazobactam (ZOSYN) IVPB 3.375 g       3.375 g 100 mL/hr over 30 Minutes Intravenous  Once 07/02/12 1150     07/02/12 0945   cefTRIAXone (ROCEPHIN) 2 g in dextrose 5 % 50 mL IVPB        2 g 100 mL/hr over 30 Minutes Intravenous  Once 07/02/12 0939 07/02/12 1115   07/02/12 0930   vancomycin (VANCOCIN) injection 1 g  Status:  Discontinued         1 g Intravenous  Once 07/02/12 0926 07/02/12 0928   07/02/12 0930   vancomycin (VANCOCIN) IVPB 1000 mg/200 mL premix        1,000 mg 200 mL/hr over 60 Minutes Intravenous  Once 07/02/12 9562 07/02/12 1045         HPI/Subjective: Noah Medina states he is having less abdominal pain.  No N/V.  His bowels have not moved since admission.  Objective: Filed Vitals:   07/02/12 2130 07/03/12 0200 07/03/12 0540 07/03/12 1001  BP: 131/65 128/56 124/58 102/56  Pulse: 76 77 75 80  Temp: 99.2 F (37.3 C) 98.7 F (37.1 C) 98.7 F (37.1 C) 98.6 F (37 C)  TempSrc: Oral Oral Oral Oral  Resp: 18 18 18 17   Height:      Weight:      SpO2: 96% 97% 97% 96%    Intake/Output Summary (Last 24 hours) at 07/03/12 1302 Last data filed at 07/03/12 1144  Gross per 24 hour  Intake 2964.99 ml  Output   2800 ml  Net 164.99 ml    Exam: Gen:  NAD Cardiovascular:  RRR, No M/R/G Respiratory: Lungs CTAB Gastrointestinal: Abdomen soft less distended.  Erythema to the lower abdomen.  2 percutaneous drains now in place. Extremities: No C/E/C  Data Reviewed: Basic Metabolic Panel:  Lab 07/03/12 1308 07/02/12 0720  NA 131* 129*  K 3.8 3.4*  CL  93* 89*  CO2 30 29  GLUCOSE 120* 138*  BUN 18 16  CREATININE 1.14 0.97  CALCIUM 8.4 9.1  MG -- --  PHOS -- --   GFR Estimated Creatinine Clearance: 75.5 ml/min (by C-G formula based on Cr of 1.14). Liver Function Tests:  Lab 07/03/12 0440 07/02/12 0720  AST 24 29  ALT 11 14  ALKPHOS 62 76  BILITOT 0.9 1.2  PROT 7.1 7.8  ALBUMIN 1.7* 2.1*    Lab 07/02/12 0720  LIPASE 21  AMYLASE --   Coagulation profile  Lab 07/03/12 0440 07/02/12 0720  INR 1.60* 1.55*  PROTIME -- --    CBC:  Lab 07/03/12 0440 07/02/12 0720  WBC 9.1 10.6*  NEUTROABS -- 8.7*  HGB 10.0* 10.4*  HCT 30.7* 30.9*  MCV 87.2 85.8  PLT 211 226   Microbiology Recent Results (from the past 240 hour(s))  CULTURE, ROUTINE-ABSCESS     Status: Normal (Preliminary  result)   Collection Time   07/02/12  3:21 PM      Component Value Range Status Comment   Specimen Description DRAINAGE   Final    Special Requests Normal   Final    Gram Stain     Final    Value: ABUNDANT WBC PRESENT, PREDOMINANTLY PMN     NO SQUAMOUS EPITHELIAL CELLS SEEN     FEW GRAM POSITIVE COCCI IN CLUSTERS     CRITICAL RESULT CALLED TO, READ BACK BY AND VERIFIED WITH: CRITICAL RESULT CALLED TO, READ BACK BY AND VERIFIED WITH: Noah Medina 6:45PM 07/02/12 BY Noah Medina.   Culture Culture reincubated for better growth   Final    Report Status PENDING   Incomplete      Studies:  Ct Guided Abscess Drain 07/02/2012   IMPRESSION: Successful 14-French intra-abdominal abscess drain insertion deep to the abdominal mesh.  Successful 12-French superficial abdominal wall abscess drain insertion anterior to the abdominal mesh  Original Report Authenticated By: Noah Medina, M.D.    Ct Abdomen Pelvis W Contrast 07/02/2012 IMPRESSION:  1. Very large (21 cm in diameter) collection of gas and fluid in the anterior abdomen, with a flat sheet of gas internally within this structure, suspicious for a large abscess along the hernia mesh. This may be multiloculated given the differing vertical levels of gas within the abscess. 2.  Cirrhotic liver disease with considerable varices. 3.  Mild splenomegaly. 4.  Wall thickening and mucosal enhancement in the colon, query colitis. 5.  Abnormal wall thickening and mild dilatation of the proximal small bowel, probably ileus secondary to the large abscess. 6.  Small pockets of fluid elsewhere in the abdomen are indeterminate with regard to whether they represent abscesses or simply ascites. 7.  Possible left unilateral pars defect at L5.  These results were called by telephone on 07/02/2012 at 9:13 a.m. to Dr. Derwood Medina, who verbally acknowledged these results.  Original Report Authenticated By: Noah Medina, M.D.    Dg Abd Acute W/chest 07/02/2012  IMPRESSION: No acute chest process  Negative for obstruction or dilatation  Suspect abdominal ascites  Original Report Authenticated By: Noah Medina, M.D.    Scheduled Meds:   . bumetanide  2 mg Oral Daily  . fentaNYL      . fentaNYL      . midazolam      . multivitamin with minerals  1 tablet Oral Daily  . pantoprazole  40 mg Oral Q1200  . piperacillin-tazobactam  3.375 g Intravenous Once  .  piperacillin-tazobactam (ZOSYN)  IV  3.375 g Intravenous Q8H  . psyllium  1 packet Oral BID  . saccharomyces boulardii  250 mg Oral BID  . sodium chloride  3 mL Intravenous Q12H  . spironolactone  100 mg Oral QAC breakfast  . vancomycin  1,000 mg Intravenous Q8H  . DISCONTD: pantoprazole (PROTONIX) IV  40 mg Intravenous QHS   Continuous Infusions:   . 0.9 % NaCl with KCl 20 mEq / L 50 mL/hr at 07/03/12 0936    Time spent: 25 minutes   LOS: 1 day   RAMA,CHRISTINA  [facility] Pager [phone].  If 8PM-8AM, please contact night-coverage at [email or web address], password [facility] 07/03/2012, 1:02 PM

## 2012-07-03 NOTE — Progress Notes (Signed)
Subjective: Abd abscess drain x 2 placement 7/20 Pt feels so much better already today  Objective: Vital signs in last 24 hours: Temp:  [97.8 F (36.6 C)-99.2 F (37.3 C)] 98.6 F (37 C) (07/21 1001) Pulse Rate:  [63-80] 80  (07/21 1001) Resp:  [13-22] 17  (07/21 1001) BP: (101-134)/(53-93) 102/56 mmHg (07/21 1001) SpO2:  [96 %-100 %] 96 % (07/21 1001) Weight:  [162 lb 12.8 oz (73.846 kg)-183 lb (83.008 kg)] 162 lb 12.8 oz (73.846 kg) (07/20 1600) Last BM Date: 07/02/12  Intake/Output from previous day: 07/20 0701 - 07/21 0700 In: 2146.7 [I.V.:1676.7; IV Piggyback:450] Out: 1625 [Urine:1300; Drains:325] Intake/Output this shift: Total I/O In: -  Out: 225 [Urine:225]  PE:  Afeb; VSS Output drain #1 200 cc 7/20; 100 cc in bag now - dark yellow; blood tinged             drain #2 125 cc 7/20; 50 cc in bag now - dark yellow Site tender to touch; no bleeding abd over all erythematous and tender Wbc 9.1 cx pending  Lab Results:   [facility] 07/03/12 0440 07/02/12 0720  WBC 9.1 10.6*  HGB 10.0* 10.4*  HCT 30.7* 30.9*  PLT 211 226   BMET  Basename 07/03/12 0440 07/02/12 0720  NA 131* 129*  K 3.8 3.4*  CL 93* 89*  CO2 30 29  GLUCOSE 120* 138*  BUN 18 16  CREATININE 1.14 0.97  CALCIUM 8.4 9.1   PT/INR  Basename 07/03/12 0440 07/02/12 0720  LABPROT 19.3* 18.9*  INR 1.60* 1.55*   ABG No results found for this basename: PHART:2,PCO2:2,PO2:2,HCO3:2 in the last 72 hours  Studies/Results: Ct Guided Abscess Drain  07/02/2012  *RADIOLOGY REPORT*  Clinical Data: Abdominal wall and intraperitoneal abscess, history of ventral hernia repair with mesh placement.  CT GUIDED INTRA-ABDOMINAL PERITONEAL ABSCESS DRAIN INSERTION CT GUIDED SUPERFICIAL ABDOMINAL WALL ABSCESS DRAIN INSERTION  Date:  07/02/2012 12:35:00  Radiologist:  M. Ruel Favors, M.D.  Medications:  4 mg Versed, 200 mcg Fentanyl  Guidance:  CT  Fluoroscopy time:  None.  Sedation time:  45 minutes  Contrast  volume:  None.  Complications:  No immediate  PROCEDURE/FINDINGS:  Informed consent was obtained from the patient following explanation of the procedure, risks, benefits and alternatives. The patient understands, agrees and consents for the procedure. All questions were addressed.  A time out was performed.  Maximal barrier sterile technique utilized including caps, mask, sterile gowns, sterile gloves, large sterile drape, hand hygiene, and betadine  Previous imaging reviewed. The patient was positioned supine. Noncontrast localization CT performed.  The larger intraperitoneal abscess collection deep to the abdominal mesh was first localized. Under sterile conditions and local anesthesia, a 17 gauge access needle was advanced from a right lower quadrant oblique approach into the deep larger abdominal abscess.  Needle position confirmed with CT.  Syringe aspiration yielded exudative fluid.  Samples sent for gram stain and culture.  Amplatz guide wire inserted followed by tract dilatation to advance a 14-French drain.  Drain catheter position confirmed with CT.  Vacutainer suction yielded 1000-50 ml of exudative fluid.  In a similar fashion, the superficial abdominal wall fluid collection was localized.  Under sterile conditions and local anesthesia, a 17 gauge access needle was advanced superficially into the second abscess collection.  Needle position confirmed with CT.  Syringe aspiration yielded a similar appearing exudative fluid.  Guide wire advanced followed by tract dilatation to insert a 12-French drain.  Syringe aspiration yielded 375 ml  of fluid.  Drain catheter secured a Prolene sutures and connected to external gravity drainage.  IMPRESSION: Successful 14-French intra-abdominal abscess drain insertion deep to the abdominal mesh.  Successful 12-French superficial abdominal wall abscess drain insertion anterior to the abdominal mesh  Original Report Authenticated By: Judie Petit. Ruel Favors, M.D.   Ct Guided  Abscess Drain  07/02/2012  *RADIOLOGY REPORT*  Clinical Data: Abdominal wall and intraperitoneal abscess, history of ventral hernia repair with mesh placement.  CT GUIDED INTRA-ABDOMINAL PERITONEAL ABSCESS DRAIN INSERTION CT GUIDED SUPERFICIAL ABDOMINAL WALL ABSCESS DRAIN INSERTION  Date:  07/02/2012 12:35:00  Radiologist:  M. Ruel Favors, M.D.  Medications:  4 mg Versed, 200 mcg Fentanyl  Guidance:  CT  Fluoroscopy time:  None.  Sedation time:  45 minutes  Contrast volume:  None.  Complications:  No immediate  PROCEDURE/FINDINGS:  Informed consent was obtained from the patient following explanation of the procedure, risks, benefits and alternatives. The patient understands, agrees and consents for the procedure. All questions were addressed.  A time out was performed.  Maximal barrier sterile technique utilized including caps, mask, sterile gowns, sterile gloves, large sterile drape, hand hygiene, and betadine  Previous imaging reviewed. The patient was positioned supine. Noncontrast localization CT performed.  The larger intraperitoneal abscess collection deep to the abdominal mesh was first localized. Under sterile conditions and local anesthesia, a 17 gauge access needle was advanced from a right lower quadrant oblique approach into the deep larger abdominal abscess.  Needle position confirmed with CT.  Syringe aspiration yielded exudative fluid.  Samples sent for gram stain and culture.  Amplatz guide wire inserted followed by tract dilatation to advance a 14-French drain.  Drain catheter position confirmed with CT.  Vacutainer suction yielded 1000-50 ml of exudative fluid.  In a similar fashion, the superficial abdominal wall fluid collection was localized.  Under sterile conditions and local anesthesia, a 17 gauge access needle was advanced superficially into the second abscess collection.  Needle position confirmed with CT.  Syringe aspiration yielded a similar appearing exudative fluid.  Guide wire  advanced followed by tract dilatation to insert a 12-French drain.  Syringe aspiration yielded 375 ml of fluid.  Drain catheter secured a Prolene sutures and connected to external gravity drainage.  IMPRESSION: Successful 14-French intra-abdominal abscess drain insertion deep to the abdominal mesh.  Successful 12-French superficial abdominal wall abscess drain insertion anterior to the abdominal mesh  Original Report Authenticated By: Judie Petit. Ruel Favors, M.D.   Ct Abdomen Pelvis W Contrast  07/02/2012  *RADIOLOGY REPORT*  Clinical Data: Abdominal pain and nausea.  Vomiting.  Abdominal distention.  CT ABDOMEN AND PELVIS WITH CONTRAST  Technique:  Multidetector CT imaging of the abdomen and pelvis was performed following the standard protocol during bolus administration of intravenous contrast.  Contrast: OMNIPAQUE IOHEXOL 300 MG/ML  SOLN  Comparison: 07/02/2012 radiographs; ultrasound of 04/21/2012  Findings: Postoperative findings noted at the gastroesophageal junction.  Nodular contour the liver is compatible with cirrhosis.  A 21.3 x 15.8 x 21.5 cm pocket of fluid and gas is present anteriorly within the abdomen, with enhancing margins suspected for suspicious for a large abscess.  I am uncertain of the location of this collection with respect to the hernia mesh, although there is a curved sheet-like plane of gas density central within this collection and oriented coronal and which could represent gas along the hernia mesh; alternatively, the hernia mesh might be posterior to the collection.  However, the collection is thought to be deep to  the abdominal wall musculature.  Scattered gas densities raise suspicion for multiloculated.  Additional small pockets of fluid are scattered in the abdomen but are not gas-filled.  Mild splenomegaly noted.  Intrahepatic portal vein branches appear attenuated.  Superior mesenteric vein is prominent and there are extensive varices below the right kidney  Abnormal wall  thickening and mild dilatation of the distal duodenum and proximal jejunum noted.  Irregular nodularity / lobularity of the colon is present, especially proximally, and colitis is not excluded.  Fluid infiltration and mild nodularity along the omentum noted; some of this is likely vascular.  Mild retroperitoneal adenopathy noted, with a left periaortic node measuring 1.3 cm in short axis on image 24 series 2.  This adenopathy may well be reactive.  Urinary bladder unremarkable.  Renal enhancement appears normal. Pancreas and adrenal glands unremarkable.  Possible left unilateral pars defect at L5 noted.  Aortoiliac atherosclerotic vascular disease is present.  IMPRESSION:  1. Very large (21 cm in diameter) collection of gas and fluid in the anterior abdomen, with a flat sheet of gas internally within this structure, suspicious for a large abscess along the hernia mesh. This may be multiloculated given the differing vertical levels of gas within the abscess. 2.  Cirrhotic liver disease with considerable varices. 3.  Mild splenomegaly. 4.  Wall thickening and mucosal enhancement in the colon, query colitis. 5.  Abnormal wall thickening and mild dilatation of the proximal small bowel, probably ileus secondary to the large abscess. 6.  Small pockets of fluid elsewhere in the abdomen are indeterminate with regard to whether they represent abscesses or simply ascites. 7.  Possible left unilateral pars defect at L5.  These results were called by telephone on 07/02/2012 at 9:13 a.m. to Dr. Derwood Kaplan, who verbally acknowledged these results.  Original Report Authenticated By: Dellia Cloud, M.D.   Dg Abd Acute W/chest  07/02/2012  *RADIOLOGY REPORT*  Clinical Data: Cirrhosis, hepatitis C, CHF, nausea, vomiting  ACUTE ABDOMEN SERIES (ABDOMEN 2 VIEW & CHEST 1 VIEW)  Comparison: 12/05/2011  Findings: Normal heart size and vascularity.  Clear lungs. Negative for pneumonia, CHF, effusion or pneumothorax.  Trachea  midline.  No free air.  Scattered air and stool throughout the abdomen.  Negative for obstruction, ileus, dilatation, or free air. Increased density of the abdomen compatible with abdominal ascites. No acute osseous finding.  IMPRESSION: No acute chest process  Negative for obstruction or dilatation  Suspect abdominal ascites  Original Report Authenticated By: Judie Petit. Ruel Favors, M.D.    Anti-infectives:   Assessment/Plan: s/p Abd abscess drain x 2 placed 7/20 Pt better  Will follow Plan per [facility]  Raschelle Wisenbaker A 07/03/2012

## 2012-07-04 DIAGNOSIS — Z22322 Carrier or suspected carrier of Methicillin resistant Staphylococcus aureus: Secondary | ICD-10-CM

## 2012-07-04 LAB — CREATININE, SERUM
GFR calc Af Amer: 88 mL/min — ABNORMAL LOW (ref >90)
GFR calc non Af Amer: 76 mL/min — ABNORMAL LOW (ref >90)

## 2012-07-04 LAB — VANCOMYCIN, RANDOM: Vancomycin Rm: 20.2 ug/mL

## 2012-07-04 MED ORDER — VANCOMYCIN HCL 1000 MG IV SOLR
1250.0000 mg | Freq: Two times a day (BID) | INTRAVENOUS | Status: DC
  Administered 2012-07-04 – 2012-07-06 (×5): 1250 mg via INTRAVENOUS
  Filled 2012-07-04 (×7): qty 1250

## 2012-07-04 MED ORDER — MUPIROCIN 2 % EX OINT
1.0000 "application " | TOPICAL_OINTMENT | Freq: Two times a day (BID) | CUTANEOUS | Status: AC
  Administered 2012-07-04 – 2012-07-08 (×9): 1 via NASAL
  Filled 2012-07-04: qty 22

## 2012-07-04 MED ORDER — CHLORHEXIDINE GLUCONATE CLOTH 2 % EX PADS
6.0000 | MEDICATED_PAD | Freq: Every day | CUTANEOUS | Status: AC
  Administered 2012-07-04 – 2012-07-08 (×5): 6 via TOPICAL

## 2012-07-04 MED FILL — Psyllium Powder Packet 95%: ORAL | Qty: 1 | Status: AC

## 2012-07-04 MED FILL — Hydromorphone HCl Inj 1 MG/ML: INTRAMUSCULAR | Qty: 1 | Status: AC

## 2012-07-04 MED FILL — Vancomycin HCl For IV Soln 1 GM (Base Equivalent): INTRAVENOUS | Qty: 1000 | Status: AC

## 2012-07-04 MED FILL — Nutritional Supplement Liquid: ORAL | Qty: 1 | Status: AC

## 2012-07-04 MED FILL — Saccharomyces boulardii Cap 250 MG: ORAL | Qty: 1 | Status: AC

## 2012-07-04 MED FILL — Oxycodone HCl Tab SR 12HR 10 MG: ORAL | Qty: 1 | Status: AC

## 2012-07-04 MED FILL — Piperacillin Sod-Tazobactam Sod in Dex IV Sol 3-0.375GM/50ML: INTRAVENOUS | Qty: 50 | Status: AC

## 2012-07-04 NOTE — Progress Notes (Addendum)
ANTIBIOTIC CONSULT NOTE - INITIAL  Pharmacy Consult for Vancomycin / Zosyn Indication: MRSA seroma / abdominal wall abscess  No Known Allergies  Patient Measurements: Height: 5\' 11"  (180.3 cm) Weight: 162 lb 12.8 oz (73.846 kg) IBW/kg (Calculated) : 75.3   Vital Signs: Temp: 98.3 F (36.8 C) (07/22 0410) Temp src: Oral (07/22 0410) BP: 112/66 mmHg (07/22 0410) Pulse Rate: 68  (07/22 0410) Intake/Output from previous day: 07/21 0701 - 07/22 0700 In: 1328.3 [P.O.:360; I.V.:858.3; IV Piggyback:100] Out: 2435 [Urine:2225; Drains:210] Intake/Output from this shift:    Labs:  [facility] 07/03/12 0440 07/02/12 0720  WBC 9.1 10.6*  HGB 10.0* 10.4*  PLT 211 226  LABCREA -- --  CREATININE 1.14 0.97   Estimated Creatinine Clearance: 75.5 ml/min (by C-G formula based on Cr of 1.14).  Basename 07/04/12 0405 07/03/12 1730  VANCOTROUGH -- 50.7*  VANCOPEAK -- --  Drue Dun 20.2 --  GENTTROUGH -- --  GENTPEAK -- --  GENTRANDOM -- --  TOBRATROUGH -- --  TOBRAPEAK -- --  TOBRARND -- --  AMIKACINPEAK -- --  AMIKACINTROU -- --  AMIKACIN -- --     Microbiology: Recent Results (from the past 720 hour(s))  BODY FLUID CULTURE     Status: Normal   Collection Time   07/02/12 10:55 AM      Component Value Range Status Comment   Specimen Description PERITONEAL CAVITY   Final    Special Requests NONE   Final    Gram Stain     Final    Value: WBC PRESENT, PREDOMINANTLY PMN     GRAM POSITIVE COCCI IN PAIRS   Culture     Final    Value: ABUNDANT METHICILLIN RESISTANT STAPHYLOCOCCUS AUREUS     Note: RIFAMPIN AND GENTAMICIN SHOULD NOT BE USED AS SINGLE DRUGS FOR TREATMENT OF STAPH INFECTIONS. This organism DOES NOT demonstrate inducible Clindamycin resistance in vitro.   Report Status 07/04/2012 FINAL   Final    Organism ID, Bacteria METHICILLIN RESISTANT STAPHYLOCOCCUS AUREUS   Final   CULTURE, ROUTINE-ABSCESS     Status: Normal (Preliminary result)   Collection Time   07/02/12  3:21  PM      Component Value Range Status Comment   Specimen Description DRAINAGE   Final    Special Requests Normal   Final    Gram Stain     Final    Value: ABUNDANT WBC PRESENT, PREDOMINANTLY PMN     NO SQUAMOUS EPITHELIAL CELLS SEEN     FEW GRAM POSITIVE COCCI IN CLUSTERS     CRITICAL RESULT CALLED TO, READ BACK BY AND VERIFIED WITH: CRITICAL RESULT CALLED TO, READ BACK BY AND VERIFIED WITH: PATTY CORBITT 6:45PM 07/02/12 BY RUSCA.   Culture     Final    Value: ABUNDANT STAPHYLOCOCCUS AUREUS     Note: RIFAMPIN AND GENTAMICIN SHOULD NOT BE USED AS SINGLE DRUGS FOR TREATMENT OF STAPH INFECTIONS.   Report Status PENDING   Incomplete   ANAEROBIC CULTURE     Status: Normal (Preliminary result)   Collection Time   07/02/12  3:21 PM      Component Value Range Status Comment   Specimen Description OTHER ABSCESS DRAINAGE PERITONEAL   Final    Special Requests Normal   Final    Gram Stain PENDING   Incomplete    Culture     Final    Value: NO ANAEROBES ISOLATED; CULTURE IN PROGRESS FOR 5 DAYS   Report Status PENDING   Incomplete  Medical History: Past Medical History  Diagnosis Date  . Cirrhosis of liver   . Hepatitis C 03-28-12    ? 80's-prev. IV drug abuse  . 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 Blood transfusion 03-28-12    '81-hx. of bleeding ulcer  . History of bleeding ulcers 03-28-12    Hx. '81  . GERD (gastroesophageal reflux disease) 03-26-12    Hx. reflux. ulcers in past, occ. OTC med    Medications:  Scheduled:    . bumetanide  2 mg Oral Daily  . feeding supplement  1 Container Oral TID WC  . multivitamin with minerals  1 tablet Oral Daily  . pantoprazole  40 mg Oral Q1200  . piperacillin-tazobactam  3.375 g Intravenous Once  . piperacillin-tazobactam (ZOSYN)  IV  3.375 g Intravenous Q8H  . psyllium  1 packet Oral BID  . saccharomyces boulardii  250 mg Oral BID  . sodium chloride  3 mL Intravenous Q12H  . spironolactone  100 mg Oral QAC  breakfast  . DISCONTD: vancomycin  1,000 mg Intravenous Q8H   Assessment:  56 y/o M with abdominal wall abscess / seroma following ventral hernia repair in April 2013, underwent drainage by IR on 07/02/12.  Abscess culture growing abundant MRSA.  Now on D#3 Vancomycin / Zosyn.  Vancomycin trough yesterday evening reported as 50.7.    RN was uncertain whether this was actually drawn prior to dose because of [facility] downtime and inability to track exact time lab was collected.   Vancomycin held overnight; random level this AM = 20.2.  Slight serum creatinine increase yesterday - today's value pending.  Goal of Therapy:   Vancomycin trough 15-20  Zosyn dosage adjusted to renal function  Eradication of infection  Plan:   Resume Vancomycin at reduced dosage of 1250mg  IV q12h  No change to Zosyn dosage - 3.375 grams IV q8h, each dose over 4 hrs  F/U on today's SCr result.  Recheck Vancomycin trough soon.  Given culture result, is continued Zosyn therapy still indicated?   Hope Budds, PharmD, BCPS Pager: [phone] 07/04/2012 9:26 AM

## 2012-07-04 NOTE — Progress Notes (Signed)
Utilization review completed.  

## 2012-07-04 NOTE — Progress Notes (Signed)
Subjective: Pain much less.  No nausea  Objective: Vital signs in last 24 hours: Temp:  [98.3 F (36.8 C)-99.3 F (37.4 C)] 98.3 F (36.8 C) (07/22 0410) Pulse Rate:  [68-87] 68  (07/22 0410) Resp:  [16-18] 18  (07/22 0410) BP: (102-112)/(56-66) 112/66 mmHg (07/22 0410) SpO2:  [96 %-98 %] 98 % (07/22 0410) Last BM Date: 07/02/12  Intake/Output from previous day: 07/21 0701 - 07/22 0700 In: 1328.3 [P.O.:360; I.V.:858.3; IV Piggyback:100] Out: 2435 [Urine:2225; Drains:210] Intake/Output this shift: Total I/O In: 510 [I.V.:400; Other:10; IV Piggyback:100] Out: 660 [Urine:600; Drains:60]  Drains seropurulent Abdomen with mild erythema Minimally tender  Lab Results:   Basename 07/03/12 0440 07/02/12 0720  WBC 9.1 10.6*  HGB 10.0* 10.4*  HCT 30.7* 30.9*  PLT 211 226   BMET  Basename 07/03/12 0440 07/02/12 0720  NA 131* 129*  K 3.8 3.4*  CL 93* 89*  CO2 30 29  GLUCOSE 120* 138*  BUN 18 16  CREATININE 1.14 0.97  CALCIUM 8.4 9.1   PT/INR  Basename 07/03/12 0440 07/02/12 0720  LABPROT 19.3* 18.9*  INR 1.60* 1.55*   ABG No results found for this basename: PHART:2,PCO2:2,PO2:2,HCO3:2 in the last 72 hours  Studies/Results: Ct Guided Abscess Drain  07/02/2012  *RADIOLOGY REPORT*  Clinical Data: Abdominal wall and intraperitoneal abscess, history of ventral hernia repair with mesh placement.  CT GUIDED INTRA-ABDOMINAL PERITONEAL ABSCESS DRAIN INSERTION CT GUIDED SUPERFICIAL ABDOMINAL WALL ABSCESS DRAIN INSERTION  Date:  07/02/2012 12:35:00  Radiologist:  M. Ruel Favors, M.D.  Medications:  4 mg Versed, 200 mcg Fentanyl  Guidance:  CT  Fluoroscopy time:  None.  Sedation time:  45 minutes  Contrast volume:  None.  Complications:  No immediate  PROCEDURE/FINDINGS:  Informed consent was obtained from the patient following explanation of the procedure, risks, benefits and alternatives. The patient understands, agrees and consents for the procedure. All questions were  addressed.  A time out was performed.  Maximal barrier sterile technique utilized including caps, mask, sterile gowns, sterile gloves, large sterile drape, hand hygiene, and betadine  Previous imaging reviewed. The patient was positioned supine. Noncontrast localization CT performed.  The larger intraperitoneal abscess collection deep to the abdominal mesh was first localized. Under sterile conditions and local anesthesia, a 17 gauge access needle was advanced from a right lower quadrant oblique approach into the deep larger abdominal abscess.  Needle position confirmed with CT.  Syringe aspiration yielded exudative fluid.  Samples sent for gram stain and culture.  Amplatz guide wire inserted followed by tract dilatation to advance a 14-French drain.  Drain catheter position confirmed with CT.  Vacutainer suction yielded 1000-50 ml of exudative fluid.  In a similar fashion, the superficial abdominal wall fluid collection was localized.  Under sterile conditions and local anesthesia, a 17 gauge access needle was advanced superficially into the second abscess collection.  Needle position confirmed with CT.  Syringe aspiration yielded a similar appearing exudative fluid.  Guide wire advanced followed by tract dilatation to insert a 12-French drain.  Syringe aspiration yielded 375 ml of fluid.  Drain catheter secured a Prolene sutures and connected to external gravity drainage.  IMPRESSION: Successful 14-French intra-abdominal abscess drain insertion deep to the abdominal mesh.  Successful 12-French superficial abdominal wall abscess drain insertion anterior to the abdominal mesh  Original Report Authenticated By: Judie Petit. Ruel Favors, M.D.   Ct Guided Abscess Drain  07/02/2012  *RADIOLOGY REPORT*  Clinical Data: Abdominal wall and intraperitoneal abscess, history of ventral hernia repair  with mesh placement.  CT GUIDED INTRA-ABDOMINAL PERITONEAL ABSCESS DRAIN INSERTION CT GUIDED SUPERFICIAL ABDOMINAL WALL ABSCESS DRAIN  INSERTION  Date:  07/02/2012 12:35:00  Radiologist:  M. Ruel Favors, M.D.  Medications:  4 mg Versed, 200 mcg Fentanyl  Guidance:  CT  Fluoroscopy time:  None.  Sedation time:  45 minutes  Contrast volume:  None.  Complications:  No immediate  PROCEDURE/FINDINGS:  Informed consent was obtained from the patient following explanation of the procedure, risks, benefits and alternatives. The patient understands, agrees and consents for the procedure. All questions were addressed.  A time out was performed.  Maximal barrier sterile technique utilized including caps, mask, sterile gowns, sterile gloves, large sterile drape, hand hygiene, and betadine  Previous imaging reviewed. The patient was positioned supine. Noncontrast localization CT performed.  The larger intraperitoneal abscess collection deep to the abdominal mesh was first localized. Under sterile conditions and local anesthesia, a 17 gauge access needle was advanced from a right lower quadrant oblique approach into the deep larger abdominal abscess.  Needle position confirmed with CT.  Syringe aspiration yielded exudative fluid.  Samples sent for gram stain and culture.  Amplatz guide wire inserted followed by tract dilatation to advance a 14-French drain.  Drain catheter position confirmed with CT.  Vacutainer suction yielded 1000-50 ml of exudative fluid.  In a similar fashion, the superficial abdominal wall fluid collection was localized.  Under sterile conditions and local anesthesia, a 17 gauge access needle was advanced superficially into the second abscess collection.  Needle position confirmed with CT.  Syringe aspiration yielded a similar appearing exudative fluid.  Guide wire advanced followed by tract dilatation to insert a 12-French drain.  Syringe aspiration yielded 375 ml of fluid.  Drain catheter secured a Prolene sutures and connected to external gravity drainage.  IMPRESSION: Successful 14-French intra-abdominal abscess drain insertion deep to  the abdominal mesh.  Successful 12-French superficial abdominal wall abscess drain insertion anterior to the abdominal mesh  Original Report Authenticated By: Judie Petit. Ruel Favors, M.D.   Ct Abdomen Pelvis W Contrast  07/02/2012  *RADIOLOGY REPORT*  Clinical Data: Abdominal pain and nausea.  Vomiting.  Abdominal distention.  CT ABDOMEN AND PELVIS WITH CONTRAST  Technique:  Multidetector CT imaging of the abdomen and pelvis was performed following the standard protocol during bolus administration of intravenous contrast.  Contrast: OMNIPAQUE IOHEXOL 300 MG/ML  SOLN  Comparison: 07/02/2012 radiographs; ultrasound of 04/21/2012  Findings: Postoperative findings noted at the gastroesophageal junction.  Nodular contour the liver is compatible with cirrhosis.  A 21.3 x 15.8 x 21.5 cm pocket of fluid and gas is present anteriorly within the abdomen, with enhancing margins suspected for suspicious for a large abscess.  I am uncertain of the location of this collection with respect to the hernia mesh, although there is a curved sheet-like plane of gas density central within this collection and oriented coronal and which could represent gas along the hernia mesh; alternatively, the hernia mesh might be posterior to the collection.  However, the collection is thought to be deep to the abdominal wall musculature.  Scattered gas densities raise suspicion for multiloculated.  Additional small pockets of fluid are scattered in the abdomen but are not gas-filled.  Mild splenomegaly noted.  Intrahepatic portal vein branches appear attenuated.  Superior mesenteric vein is prominent and there are extensive varices below the right kidney  Abnormal wall thickening and mild dilatation of the distal duodenum and proximal jejunum noted.  Irregular nodularity / lobularity of the  colon is present, especially proximally, and colitis is not excluded.  Fluid infiltration and mild nodularity along the omentum noted; some of this is likely  vascular.  Mild retroperitoneal adenopathy noted, with a left periaortic node measuring 1.3 cm in short axis on image 24 series 2.  This adenopathy may well be reactive.  Urinary bladder unremarkable.  Renal enhancement appears normal. Pancreas and adrenal glands unremarkable.  Possible left unilateral pars defect at L5 noted.  Aortoiliac atherosclerotic vascular disease is present.  IMPRESSION:  1. Very large (21 cm in diameter) collection of gas and fluid in the anterior abdomen, with a flat sheet of gas internally within this structure, suspicious for a large abscess along the hernia mesh. This may be multiloculated given the differing vertical levels of gas within the abscess. 2.  Cirrhotic liver disease with considerable varices. 3.  Mild splenomegaly. 4.  Wall thickening and mucosal enhancement in the colon, query colitis. 5.  Abnormal wall thickening and mild dilatation of the proximal small bowel, probably ileus secondary to the large abscess. 6.  Small pockets of fluid elsewhere in the abdomen are indeterminate with regard to whether they represent abscesses or simply ascites. 7.  Possible left unilateral pars defect at L5.  These results were called by telephone on 07/02/2012 at 9:13 a.m. to Dr. Derwood Kaplan, who verbally acknowledged these results.  Original Report Authenticated By: Dellia Cloud, M.D.   Dg Abd Acute W/chest  07/02/2012  *RADIOLOGY REPORT*  Clinical Data: Cirrhosis, hepatitis C, CHF, nausea, vomiting  ACUTE ABDOMEN SERIES (ABDOMEN 2 VIEW & CHEST 1 VIEW)  Comparison: 12/05/2011  Findings: Normal heart size and vascularity.  Clear lungs. Negative for pneumonia, CHF, effusion or pneumothorax.  Trachea midline.  No free air.  Scattered air and stool throughout the abdomen.  Negative for obstruction, ileus, dilatation, or free air. Increased density of the abdomen compatible with abdominal ascites. No acute osseous finding.  IMPRESSION: No acute chest process  Negative for  obstruction or dilatation  Suspect abdominal ascites  Original Report Authenticated By: Judie Petit. Ruel Favors, M.D.    Anti-infectives: Anti-infectives     Start     Dose/Rate Route Frequency Ordered Stop   07/02/12 2200   piperacillin-tazobactam (ZOSYN) IVPB 3.375 g        3.375 g 12.5 mL/hr over 240 Minutes Intravenous 3 times per day 07/02/12 1416     07/02/12 1800   vancomycin (VANCOCIN) IVPB 1000 mg/200 mL premix        1,000 mg 200 mL/hr over 60 Minutes Intravenous Every 8 hours 07/02/12 1419     07/02/12 1200   piperacillin-tazobactam (ZOSYN) IVPB 3.375 g        3.375 g 100 mL/hr over 30 Minutes Intravenous  Once 07/02/12 1150     07/02/12 0945   cefTRIAXone (ROCEPHIN) 2 g in dextrose 5 % 50 mL IVPB        2 g 100 mL/hr over 30 Minutes Intravenous  Once 07/02/12 0939 07/02/12 1115   07/02/12 0930   vancomycin (VANCOCIN) injection 1 g  Status:  Discontinued        1 g Intravenous  Once 07/02/12 0926 07/02/12 0928   07/02/12 0930   vancomycin (VANCOCIN) IVPB 1000 mg/200 mL premix        1,000 mg 200 mL/hr over 60 Minutes Intravenous  Once 07/02/12 0929 07/02/12 1045          Assessment/Plan: s/p * No surgery found *  Seroma with possible infection  and seeding of mesh  Again, given his comorbidities, will continue conservative management, nutrition, IV antibiotics. Still may have to remove mesh  LOS: 2 days    Beckie Viscardi A 07/04/2012

## 2012-07-04 NOTE — Progress Notes (Signed)
[facility] PROGRESS NOTE  Noah Medina:[id number] DOB: 01/13/1956 DOA: 07/02/2012 PCP: Iona Hansen, NP  Assessment/Plan: Principal Problem:  *Seroma, postoperative, possible abscess / MRSA abscess  Admitted by surgery. Percutaneous drainage done by IR 07/02/12.   Diagnostic paracentesis done by EDP. Abundant MRSA.  Recommend d/c zosyn but will defer to surgeon. Active Problems:  Hyponatremia  Secondary to cirrhosis physiology.  Na+ stable. Normocytic anemia  Likely AOCD.  Hemoglobin stable. 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 LFTs not elevated.   Continue diuretics.   Coags show slight impairment of synthetic function. Hypokalemia   Potassium added to IVF. Severe malnutrition   Dietician evaluation requested.   Brief narrative: [name] is a 56 year old male with a PMH of ventral hernia (repaired surgically 4/13), and cirrhosis who presents with 2 days of nausea, bilious emesis, and increased abdominal pain.  Imaging studies done in the ER showed a recurrent seroma, which was drained by IR 07/02/12. IM was asked to consult for management of the patient's cirrhosis.  Procedures:  CT guided abscess drain 07/02/12 by Dr. Miles Costain.  Antibiotics:  Vancomycin 07/02/12--->  Zosyn 07/02/12--->  Rocephin 07/02/12--->07/02/12  HPI/Subjective: Mr. Faulcon states he is having less abdominal pain.  No N/V.  His bowels have not moved since admission.  No dyspnea.  Objective: Filed Vitals:   07/03/12 1001 07/03/12 1431 07/04/12 0100 07/04/12 0410  BP: 102/56 112/66 109/64 112/66  Pulse: 80 87 69 68  Temp: 98.6 F (37 C) 99.3 F (37.4 C) 98.7 F (37.1 C) 98.3 F (36.8 C)  TempSrc: Oral Oral Oral Oral  Resp: 17 16 17 18   Height:      Weight:      SpO2: 96% 98% 97% 98%    Intake/Output Summary (Last 24 hours) at 07/04/12 1236 Last data filed at 07/04/12 1000  Gross per 24 hour  Intake    510 ml  Output   1660 ml    Net  -1150 ml    Exam: Gen:  NAD Cardiovascular:  RRR, No M/R/G Respiratory: Lungs CTAB Gastrointestinal: Abdomen soft less distended.  Erythema to the lower abdomen, which is improved from yesterday.  2 percutaneous drains remain in place. Extremities: No C/E/C  Data Reviewed: Basic Metabolic Panel:  Lab 07/04/12 1478 07/03/12 0440 07/02/12 0720  NA -- 131* 129*  K -- 3.8 3.4*  CL -- 93* 89*  CO2 -- 30 29  GLUCOSE -- 120* 138*  BUN -- 18 16  CREATININE 1.07 1.14 0.97  CALCIUM -- 8.4 9.1  MG -- -- --  PHOS -- -- --   GFR Estimated Creatinine Clearance: 80.5 ml/min (by C-G formula based on Cr of 1.07). Liver Function Tests:  Lab 07/03/12 0440 07/02/12 0720  AST 24 29  ALT 11 14  ALKPHOS 62 76  BILITOT 0.9 1.2  PROT 7.1 7.8  ALBUMIN 1.7* 2.1*    Lab 07/02/12 0720  LIPASE 21  AMYLASE --   Coagulation profile  Lab 07/03/12 0440 07/02/12 0720  INR 1.60* 1.55*  PROTIME -- --    CBC:  Lab 07/03/12 0440 07/02/12 0720  WBC 9.1 10.6*  NEUTROABS -- 8.7*  HGB 10.0* 10.4*  HCT 30.7* 30.9*  MCV 87.2 85.8  PLT 211 226   Microbiology Recent Results (from the past 240 hour(s))  BODY FLUID CULTURE     Status: Normal   Collection Time   07/02/12 10:55 AM  Component Value Range Status Comment   Specimen Description PERITONEAL CAVITY   Final    Special Requests NONE   Final    Gram Stain     Final    Value: WBC PRESENT, PREDOMINANTLY PMN     GRAM POSITIVE COCCI IN PAIRS   Culture     Final    Value: ABUNDANT METHICILLIN RESISTANT STAPHYLOCOCCUS AUREUS     Note: RIFAMPIN AND GENTAMICIN SHOULD NOT BE USED AS SINGLE DRUGS FOR TREATMENT OF STAPH INFECTIONS. This organism DOES NOT demonstrate inducible Clindamycin resistance in vitro.   Report Status 07/04/2012 FINAL   Final    Organism ID, Bacteria METHICILLIN RESISTANT STAPHYLOCOCCUS AUREUS   Final   CULTURE, ROUTINE-ABSCESS     Status: Normal (Preliminary result)   Collection Time   07/02/12  3:21 PM       Component Value Range Status Comment   Specimen Description DRAINAGE   Final    Special Requests Normal   Final    Gram Stain     Final    Value: ABUNDANT WBC PRESENT, PREDOMINANTLY PMN     NO SQUAMOUS EPITHELIAL CELLS SEEN     FEW GRAM POSITIVE COCCI IN CLUSTERS     CRITICAL RESULT CALLED TO, READ BACK BY AND VERIFIED WITH: CRITICAL RESULT CALLED TO, READ BACK BY AND VERIFIED WITH: PATTY CORBITT 6:45PM 07/02/12 BY RUSCA.   Culture     Final    Value: ABUNDANT STAPHYLOCOCCUS AUREUS     Note: RIFAMPIN AND GENTAMICIN SHOULD NOT BE USED AS SINGLE DRUGS FOR TREATMENT OF STAPH INFECTIONS.   Report Status PENDING   Incomplete   ANAEROBIC CULTURE     Status: Normal (Preliminary result)   Collection Time   07/02/12  3:21 PM      Component Value Range Status Comment   Specimen Description OTHER ABSCESS DRAINAGE PERITONEAL   Final    Special Requests Normal   Final    Gram Stain PENDING   Incomplete    Culture     Final    Value: NO ANAEROBES ISOLATED; CULTURE IN PROGRESS FOR 5 DAYS   Report Status PENDING   Incomplete      Studies:  Ct Guided Abscess Drain 07/02/2012   IMPRESSION: Successful 14-French intra-abdominal abscess drain insertion deep to the abdominal mesh.  Successful 12-French superficial abdominal wall abscess drain insertion anterior to the abdominal mesh  Original Report Authenticated By: Judie Petit. Ruel Favors, M.D.    Ct Abdomen Pelvis W Contrast 07/02/2012 IMPRESSION:  1. Very large (21 cm in diameter) collection of gas and fluid in the anterior abdomen, with a flat sheet of gas internally within this structure, suspicious for a large abscess along the hernia mesh. This may be multiloculated given the differing vertical levels of gas within the abscess. 2.  Cirrhotic liver disease with considerable varices. 3.  Mild splenomegaly. 4.  Wall thickening and mucosal enhancement in the colon, query colitis. 5.  Abnormal wall thickening and mild dilatation of the proximal small bowel, probably  ileus secondary to the large abscess. 6.  Small pockets of fluid elsewhere in the abdomen are indeterminate with regard to whether they represent abscesses or simply ascites. 7.  Possible left unilateral pars defect at L5.  These results were called by telephone on 07/02/2012 at 9:13 a.m. to Dr. Derwood Kaplan, who verbally acknowledged these results.  Original Report Authenticated By: Dellia Cloud, M.D.    Dg Abd Acute W/chest 07/02/2012 IMPRESSION: No acute chest process  Negative for obstruction or dilatation  Suspect abdominal ascites  Original Report Authenticated By: Judie Petit. Ruel Favors, M.D.    Scheduled Meds:    . bumetanide  2 mg Oral Daily  . Chlorhexidine Gluconate Cloth  6 each Topical Q0600  . feeding supplement  1 Container Oral TID WC  . multivitamin with minerals  1 tablet Oral Daily  . mupirocin ointment  1 application Nasal BID  . pantoprazole  40 mg Oral Q1200  . piperacillin-tazobactam  3.375 g Intravenous Once  . piperacillin-tazobactam (ZOSYN)  IV  3.375 g Intravenous Q8H  . psyllium  1 packet Oral BID  . saccharomyces boulardii  250 mg Oral BID  . sodium chloride  3 mL Intravenous Q12H  . spironolactone  100 mg Oral QAC breakfast  . vancomycin  1,250 mg Intravenous Q12H  . DISCONTD: vancomycin  1,000 mg Intravenous Q8H   Continuous Infusions:    . 0.9 % NaCl with KCl 20 mEq / L 50 mL/hr at 07/03/12 0936    Time spent: 25 minutes   LOS: 2 days   Godric Lavell  [facility] Pager [phone].  If 8PM-8AM, please contact night-coverage at [email or web address], password [facility] 07/04/2012, 12:36 PM

## 2012-07-04 NOTE — Progress Notes (Signed)
Subjective: Pt cont to feel better Less pain  Objective: Physical Exam: BP 112/66  Pulse 68  Temp 98.3 F (36.8 C) (Oral)  Resp 18  Ht 5\' 11"  (1.803 m)  Wt 162 lb 12.8 oz (73.846 kg)  BMI 22.71 kg/m2  SpO2 98% Drains intact, output trending down #1-135cc recorded #2-75cc recorded   Labs: CBC  Basename 07/03/12 0440 07/02/12 0720  WBC 9.1 10.6*  HGB 10.0* 10.4*  HCT 30.7* 30.9*  PLT 211 226   BMET  Basename 07/04/12 0405 07/03/12 0440 07/02/12 0720  NA -- 131* 129*  K -- 3.8 3.4*  CL -- 93* 89*  CO2 -- 30 29  GLUCOSE -- 120* 138*  BUN -- 18 16  CREATININE 1.07 1.14 --  CALCIUM -- 8.4 9.1   LFT  Basename 07/03/12 0440 07/02/12 0720  PROT 7.1 --  ALBUMIN 1.7* --  AST 24 --  ALT 11 --  ALKPHOS 62 --  BILITOT 0.9 --  BILIDIR -- 0.3  IBILI -- 0.9  LIPASE -- 21   PT/INR  Basename 07/03/12 0440 07/02/12 0720  LABPROT 19.3* 18.9*  INR 1.60* 1.55*     Studies/Results: Ct Guided Abscess Drain  07/02/2012  *RADIOLOGY REPORT*  Clinical Data: Abdominal wall and intraperitoneal abscess, history of ventral hernia repair with mesh placement.  CT GUIDED INTRA-ABDOMINAL PERITONEAL ABSCESS DRAIN INSERTION CT GUIDED SUPERFICIAL ABDOMINAL WALL ABSCESS DRAIN INSERTION  Date:  07/02/2012 12:35:00  Radiologist:  M. Ruel Favors, M.D.  Medications:  4 mg Versed, 200 mcg Fentanyl  Guidance:  CT  Fluoroscopy time:  None.  Sedation time:  45 minutes  Contrast volume:  None.  Complications:  No immediate  PROCEDURE/FINDINGS:  Informed consent was obtained from the patient following explanation of the procedure, risks, benefits and alternatives. The patient understands, agrees and consents for the procedure. All questions were addressed.  A time out was performed.  Maximal barrier sterile technique utilized including caps, mask, sterile gowns, sterile gloves, large sterile drape, hand hygiene, and betadine  Previous imaging reviewed. The patient was positioned supine. Noncontrast  localization CT performed.  The larger intraperitoneal abscess collection deep to the abdominal mesh was first localized. Under sterile conditions and local anesthesia, a 17 gauge access needle was advanced from a right lower quadrant oblique approach into the deep larger abdominal abscess.  Needle position confirmed with CT.  Syringe aspiration yielded exudative fluid.  Samples sent for gram stain and culture.  Amplatz guide wire inserted followed by tract dilatation to advance a 14-French drain.  Drain catheter position confirmed with CT.  Vacutainer suction yielded 1000-50 ml of exudative fluid.  In a similar fashion, the superficial abdominal wall fluid collection was localized.  Under sterile conditions and local anesthesia, a 17 gauge access needle was advanced superficially into the second abscess collection.  Needle position confirmed with CT.  Syringe aspiration yielded a similar appearing exudative fluid.  Guide wire advanced followed by tract dilatation to insert a 12-French drain.  Syringe aspiration yielded 375 ml of fluid.  Drain catheter secured a Prolene sutures and connected to external gravity drainage.  IMPRESSION: Successful 14-French intra-abdominal abscess drain insertion deep to the abdominal mesh.  Successful 12-French superficial abdominal wall abscess drain insertion anterior to the abdominal mesh  Original Report Authenticated By: Judie Petit. Ruel Favors, M.D.   Ct Guided Abscess Drain  07/02/2012  *RADIOLOGY REPORT*  Clinical Data: Abdominal wall and intraperitoneal abscess, history of ventral hernia repair with mesh placement.  CT GUIDED INTRA-ABDOMINAL PERITONEAL  ABSCESS DRAIN INSERTION CT GUIDED SUPERFICIAL ABDOMINAL WALL ABSCESS DRAIN INSERTION  Date:  07/02/2012 12:35:00  Radiologist:  M. Ruel Favors, M.D.  Medications:  4 mg Versed, 200 mcg Fentanyl  Guidance:  CT  Fluoroscopy time:  None.  Sedation time:  45 minutes  Contrast volume:  None.  Complications:  No immediate   PROCEDURE/FINDINGS:  Informed consent was obtained from the patient following explanation of the procedure, risks, benefits and alternatives. The patient understands, agrees and consents for the procedure. All questions were addressed.  A time out was performed.  Maximal barrier sterile technique utilized including caps, mask, sterile gowns, sterile gloves, large sterile drape, hand hygiene, and betadine  Previous imaging reviewed. The patient was positioned supine. Noncontrast localization CT performed.  The larger intraperitoneal abscess collection deep to the abdominal mesh was first localized. Under sterile conditions and local anesthesia, a 17 gauge access needle was advanced from a right lower quadrant oblique approach into the deep larger abdominal abscess.  Needle position confirmed with CT.  Syringe aspiration yielded exudative fluid.  Samples sent for gram stain and culture.  Amplatz guide wire inserted followed by tract dilatation to advance a 14-French drain.  Drain catheter position confirmed with CT.  Vacutainer suction yielded 1000-50 ml of exudative fluid.  In a similar fashion, the superficial abdominal wall fluid collection was localized.  Under sterile conditions and local anesthesia, a 17 gauge access needle was advanced superficially into the second abscess collection.  Needle position confirmed with CT.  Syringe aspiration yielded a similar appearing exudative fluid.  Guide wire advanced followed by tract dilatation to insert a 12-French drain.  Syringe aspiration yielded 375 ml of fluid.  Drain catheter secured a Prolene sutures and connected to external gravity drainage.  IMPRESSION: Successful 14-French intra-abdominal abscess drain insertion deep to the abdominal mesh.  Successful 12-French superficial abdominal wall abscess drain insertion anterior to the abdominal mesh  Original Report Authenticated By: Judie Petit. Ruel Favors, M.D.    Assessment/Plan: s/p Abd abscess drain x 2 placed 7/20    Pt better  Will follow  Plan per [facility]   LOS: 2 days   Brayton El PA-C 07/04/2012 2:44 PM

## 2012-07-05 LAB — CULTURE, ROUTINE-ABSCESS: Special Requests: NORMAL

## 2012-07-05 MED ORDER — SENNOSIDES-DOCUSATE SODIUM 8.6-50 MG PO TABS
1.0000 | ORAL_TABLET | Freq: Every evening | ORAL | Status: DC | PRN
  Filled 2012-07-05: qty 1

## 2012-07-05 NOTE — Progress Notes (Signed)
  Subjective: abd abscess drains x 2 placed 7/20: MRSA Pt so much better Eating now   Objective: Vital signs in last 24 hours: Temp:  [97.1 F (36.2 C)-98.8 F (37.1 C)] 97.1 F (36.2 C) (07/23 0539) Pulse Rate:  [64-74] 64  (07/23 0539) Resp:  [16-18] 18  (07/23 0539) BP: (101-116)/(57-84) 106/60 mmHg (07/23 0539) SpO2:  [96 %-100 %] 96 % (07/23 0539) Last BM Date: 07/04/12  Intake/Output from previous day: 07/22 0701 - 07/23 0700 In: 1515 [P.O.:320; I.V.:1165] Out: 3450 [Urine:3250; Drains:200] Intake/Output this shift: Total I/O In: -  Out: 100 [Urine:100]  PE:  VSS; afeb Drains intact #1: output: dark yellow; 170 cc yesterday; 50 cc in bag now #2: output: yellow; 30 cc yesterday per chart; 40 cc in bag now + MRSA Sites are clean and dry; NT abd skin less red  Lab Results:   Basename 07/03/12 0440  WBC 9.1  HGB 10.0*  HCT 30.7*  PLT 211   BMET  Basename 07/04/12 0405 07/03/12 0440  NA -- 131*  K -- 3.8  CL -- 93*  CO2 -- 30  GLUCOSE -- 120*  BUN -- 18  CREATININE 1.07 1.14  CALCIUM -- 8.4   PT/INR  Basename 07/03/12 0440  LABPROT 19.3*  INR 1.60*   ABG No results found for this basename: PHART:2,PCO2:2,PO2:2,HCO3:2 in the last 72 hours  Studies/Results: No results found.  Anti-infectives:   Assessment/Plan: s/p * No surgery found *  Adb abscess drains x 2 placed 7/20 So much better Eating without issue Will follow Output still significant Need re CT when outtput diminishes Plan per [facility]  Tre Sanker A 07/05/2012

## 2012-07-05 NOTE — Progress Notes (Addendum)
[facility] PROGRESS NOTE  Noah Medina ZOX:[id number] DOB: Oct 31, 1956 DOA: 07/02/2012 PCP: Iona Hansen, NP  Assessment/Plan: Principal Problem:  *Seroma, postoperative, possible abscess / MRSA abscess  Admitted by surgery. Percutaneous drain x 2 placed by IR 07/02/12.   Diagnostic paracentesis done by EDP.  Abundant MRSA.  Recommend d/c zosyn but will defer to surgeon.  Likely will need mesh removed. Active Problems:  Hyponatremia  Secondary to cirrhosis physiology.  Na+ stable. Normocytic anemia  Likely AOCD.  Hemoglobin stable. 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 LFTs not elevated.   Continue diuretics.   Coags show slight impairment of synthetic function. Hypokalemia   Potassium added to IVF. Severe malnutrition   Dietician evaluation performed 07/03/12.  Resource supplements ordered.   Brief narrative: Noah Medina is a 56 year old male with a PMH of ventral hernia (repaired surgically 4/13), and cirrhosis who presents with 2 days of nausea, bilious emesis, and increased abdominal pain.  Imaging studies done in the ER showed a recurrent seroma, which was drained by IR 07/02/12. IM was asked to consult for management of the patient's cirrhosis.  Procedures:  CT guided abscess drain 07/02/12 by Dr. Miles Costain.  Antibiotics:  Vancomycin 07/02/12--->  Zosyn 07/02/12--->  Rocephin 07/02/12--->07/02/12  HPI/Subjective: Noah Medina is comfortable.  No N/V/D.  No F/C.   Objective: Filed Vitals:   07/04/12 1800 07/04/12 2044 07/05/12 0142 07/05/12 0539  BP: 107/84 101/57 116/66 106/60  Pulse: 69 74 68 64  Temp: 98.8 F (37.1 C) 97.9 F (36.6 C) 98 F (36.7 C) 97.1 F (36.2 C)  TempSrc: Axillary Oral Oral Oral  Resp: 18 18 16 18   Height:      Weight:      SpO2: 99% 98% 100% 96%    Intake/Output Summary (Last 24 hours) at 07/05/12 1231 Last data filed at 07/05/12 1000  Gross per 24 hour  Intake 1515.01 ml  Output    3330 ml  Net -1814.99 ml    Exam: Gen:  NAD Cardiovascular:  RRR, No M/R/G Respiratory: Lungs CTAB Gastrointestinal: Abdomen less distended.  Erythema to the lower abdomen, which is improved from yesterday.  2 percutaneous drains remain in place. Extremities: No C/E/C  Data Reviewed: Basic Metabolic Panel:  Lab 07/04/12 8119 07/03/12 0440 07/02/12 0720  NA -- 131* 129*  K -- 3.8 3.4*  CL -- 93* 89*  CO2 -- 30 29  GLUCOSE -- 120* 138*  BUN -- 18 16  CREATININE 1.07 1.14 0.97  CALCIUM -- 8.4 9.1  MG -- -- --  PHOS -- -- --   GFR Estimated Creatinine Clearance: 80.5 ml/min (by C-G formula based on Cr of 1.07). Liver Function Tests:  Lab 07/03/12 0440 07/02/12 0720  AST 24 29  ALT 11 14  ALKPHOS 62 76  BILITOT 0.9 1.2  PROT 7.1 7.8  ALBUMIN 1.7* 2.1*    Lab 07/02/12 0720  LIPASE 21  AMYLASE --   Coagulation profile  Lab 07/03/12 0440 07/02/12 0720  INR 1.60* 1.55*  PROTIME -- --    CBC:  Lab 07/03/12 0440 07/02/12 0720  WBC 9.1 10.6*  NEUTROABS -- 8.7*  HGB 10.0* 10.4*  HCT 30.7* 30.9*  MCV 87.2 85.8  PLT 211 226   Microbiology Recent Results (from the past 240 hour(s))  BODY FLUID CULTURE     Status: Normal   Collection Time   07/02/12 10:55 AM      Component Value Range Status  Comment   Specimen Description PERITONEAL CAVITY   Final    Special Requests NONE   Final    Gram Stain     Final    Value: WBC PRESENT, PREDOMINANTLY PMN     GRAM POSITIVE COCCI IN PAIRS   Culture     Final    Value: ABUNDANT METHICILLIN RESISTANT STAPHYLOCOCCUS AUREUS     Note: RIFAMPIN AND GENTAMICIN SHOULD NOT BE USED AS SINGLE DRUGS FOR TREATMENT OF STAPH INFECTIONS. This organism DOES NOT demonstrate inducible Clindamycin resistance in vitro.   Report Status 07/04/2012 FINAL   Final    Organism ID, Bacteria METHICILLIN RESISTANT STAPHYLOCOCCUS AUREUS   Final   CULTURE, ROUTINE-ABSCESS     Status: Normal   Collection Time   07/02/12  3:21 PM      Component Value  Range Status Comment   Specimen Description DRAINAGE   Final    Special Requests Normal   Final    Gram Stain     Final    Value: ABUNDANT WBC PRESENT, PREDOMINANTLY PMN     NO SQUAMOUS EPITHELIAL CELLS SEEN     FEW GRAM POSITIVE COCCI IN CLUSTERS     CRITICAL RESULT CALLED TO, READ BACK BY AND VERIFIED WITH: CRITICAL RESULT CALLED TO, READ BACK BY AND VERIFIED WITH: PATTY CORBITT 6:45PM 07/02/12 BY RUSCA.   Culture     Final    Value: ABUNDANT METHICILLIN RESISTANT STAPHYLOCOCCUS AUREUS     Note: RIFAMPIN AND GENTAMICIN SHOULD NOT BE USED AS SINGLE DRUGS FOR TREATMENT OF STAPH INFECTIONS. This organism DOES NOT demonstrate inducible Clindamycin resistance in vitro. CRITICAL RESULT CALLED TO, READ BACK BY AND VERIFIED WITH: CLARISSA S BY      INGRAM A 7/23 845AM   Report Status 07/05/2012 FINAL   Final    Organism ID, Bacteria METHICILLIN RESISTANT STAPHYLOCOCCUS AUREUS   Final   ANAEROBIC CULTURE     Status: Normal (Preliminary result)   Collection Time   07/02/12  3:21 PM      Component Value Range Status Comment   Specimen Description OTHER ABSCESS DRAINAGE PERITONEAL   Final    Special Requests Normal   Final    Gram Stain PENDING   Incomplete    Culture     Final    Value: NO ANAEROBES ISOLATED; CULTURE IN PROGRESS FOR 5 DAYS   Report Status PENDING   Incomplete      Studies:  Ct Guided Abscess Drain 07/02/2012   IMPRESSION: Successful 14-French intra-abdominal abscess drain insertion deep to the abdominal mesh.  Successful 12-French superficial abdominal wall abscess drain insertion anterior to the abdominal mesh  Original Report Authenticated By: Judie Petit. Ruel Favors, M.D.    Ct Abdomen Pelvis W Contrast 07/02/2012 IMPRESSION:  1. Very large (21 cm in diameter) collection of gas and fluid in the anterior abdomen, with a flat sheet of gas internally within this structure, suspicious for a large abscess along the hernia mesh. This may be multiloculated given the differing vertical levels  of gas within the abscess. 2.  Cirrhotic liver disease with considerable varices. 3.  Mild splenomegaly. 4.  Wall thickening and mucosal enhancement in the colon, query colitis. 5.  Abnormal wall thickening and mild dilatation of the proximal small bowel, probably ileus secondary to the large abscess. 6.  Small pockets of fluid elsewhere in the abdomen are indeterminate with regard to whether they represent abscesses or simply ascites. 7.  Possible left unilateral pars defect at L5.  These  results were called by telephone on 07/02/2012 at 9:13 a.m. to Dr. Derwood Kaplan, who verbally acknowledged these results.  Original Report Authenticated By: Dellia Cloud, M.D.    Dg Abd Acute W/chest 07/02/2012 IMPRESSION: No acute chest process  Negative for obstruction or dilatation  Suspect abdominal ascites  Original Report Authenticated By: Judie Petit. Ruel Favors, M.D.    Scheduled Meds:    . bumetanide  2 mg Oral Daily  . Chlorhexidine Gluconate Cloth  6 each Topical Q0600  . feeding supplement  1 Container Oral TID WC  . multivitamin with minerals  1 tablet Oral Daily  . mupirocin ointment  1 application Nasal BID  . pantoprazole  40 mg Oral Q1200  . piperacillin-tazobactam  3.375 g Intravenous Once  . piperacillin-tazobactam (ZOSYN)  IV  3.375 g Intravenous Q8H  . psyllium  1 packet Oral BID  . saccharomyces boulardii  250 mg Oral BID  . sodium chloride  3 mL Intravenous Q12H  . spironolactone  100 mg Oral QAC breakfast  . vancomycin  1,250 mg Intravenous Q12H   Continuous Infusions:    . 0.9 % NaCl with KCl 20 mEq / L 50 mL/hr at 07/04/12 1847    Time spent: 25 minutes   LOS: 3 days   Noah Medina  [facility] Pager [phone].  If 8PM-8AM, please contact night-coverage at [email or web address], password [facility] 07/05/2012, 12:31 PM

## 2012-07-05 NOTE — Progress Notes (Signed)
  Subjective: Reports less pain No fevers  Objective: Vital signs in last 24 hours: Temp:  [97.1 F (36.2 C)-98.8 F (37.1 C)] 97.1 F (36.2 C) (07/23 0539) Pulse Rate:  [64-74] 64  (07/23 0539) Resp:  [16-18] 18  (07/23 0539) BP: (101-116)/(57-84) 106/60 mmHg (07/23 0539) SpO2:  [96 %-100 %] 96 % (07/23 0539) Last BM Date: 07/04/12  Intake/Output from previous day: 07/22 0701 - 07/23 0700 In: 1515 [P.O.:320; I.V.:1165] Out: 3450 [Urine:3250; Drains:200] Intake/Output this shift:    Abdomen soft, less distended Drains more serosang Mild erythema of abdominal wall  Lab Results:   Basename 07/03/12 0440  WBC 9.1  HGB 10.0*  HCT 30.7*  PLT 211   BMET  Basename 07/04/12 0405 07/03/12 0440  NA -- 131*  K -- 3.8  CL -- 93*  CO2 -- 30  GLUCOSE -- 120*  BUN -- 18  CREATININE 1.07 1.14  CALCIUM -- 8.4   PT/INR  Basename 07/03/12 0440  LABPROT 19.3*  INR 1.60*   ABG No results found for this basename: PHART:2,PCO2:2,PO2:2,HCO3:2 in the last 72 hours  Studies/Results: No results found.  Anti-infectives: Anti-infectives     Start     Dose/Rate Route Frequency Ordered Stop   07/04/12 1000   vancomycin (VANCOCIN) 1,250 mg in sodium chloride 0.9 % 250 mL IVPB        1,250 mg 166.7 mL/hr over 90 Minutes Intravenous Every 12 hours 07/04/12 0914     07/02/12 2200   piperacillin-tazobactam (ZOSYN) IVPB 3.375 g        3.375 g 12.5 mL/hr over 240 Minutes Intravenous 3 times per day 07/02/12 1416     07/02/12 1800   vancomycin (VANCOCIN) IVPB 1000 mg/200 mL premix  Status:  Discontinued        1,000 mg 200 mL/hr over 60 Minutes Intravenous Every 8 hours 07/02/12 1419 07/04/12 0728   07/02/12 1200   piperacillin-tazobactam (ZOSYN) IVPB 3.375 g        3.375 g 100 mL/hr over 30 Minutes Intravenous  Once 07/02/12 1150     07/02/12 0945   cefTRIAXone (ROCEPHIN) 2 g in dextrose 5 % 50 mL IVPB        2 g 100 mL/hr over 30 Minutes Intravenous  Once 07/02/12 0939  07/02/12 1115   07/02/12 0930   vancomycin (VANCOCIN) injection 1 g  Status:  Discontinued        1 g Intravenous  Once 07/02/12 0926 07/02/12 0928   07/02/12 0930   vancomycin (VANCOCIN) IVPB 1000 mg/200 mL premix        1,000 mg 200 mL/hr over 60 Minutes Intravenous  Once 07/02/12 0929 07/02/12 1045          Assessment/Plan: s/p * No surgery found *  Infected seroma with abdominal wall cellulitis, probable mesh infection Multiple comorbidities with cirrhosis.  Will continue drains and IV antibiotics and maximize his nutrition prior to probable removal of mesh.  LOS: 3 days    Angeliki Mates A 07/05/2012

## 2012-07-06 DIAGNOSIS — K7460 Unspecified cirrhosis of liver: Secondary | ICD-10-CM

## 2012-07-06 DIAGNOSIS — L02219 Cutaneous abscess of trunk, unspecified: Secondary | ICD-10-CM

## 2012-07-06 DIAGNOSIS — Z22322 Carrier or suspected carrier of Methicillin resistant Staphylococcus aureus: Secondary | ICD-10-CM

## 2012-07-06 LAB — CREATININE, SERUM
Creatinine, Ser: 0.81 mg/dL (ref 0.50–1.35)
GFR calc non Af Amer: >90 mL/min (ref >90)

## 2012-07-06 MED ORDER — ENSURE COMPLETE PO LIQD
237.0000 mL | Freq: Two times a day (BID) | ORAL | Status: DC
  Administered 2012-07-06 – 2012-07-10 (×7): 237 mL via ORAL

## 2012-07-06 MED ORDER — VANCOMYCIN HCL IN DEXTROSE 1-5 GM/200ML-% IV SOLN
1000.0000 mg | Freq: Two times a day (BID) | INTRAVENOUS | Status: DC
  Administered 2012-07-06 – 2012-07-10 (×9): 1000 mg via INTRAVENOUS
  Filled 2012-07-06 (×10): qty 200

## 2012-07-06 MED ORDER — DOCUSATE SODIUM 100 MG PO CAPS
100.0000 mg | ORAL_CAPSULE | Freq: Every day | ORAL | Status: DC
  Administered 2012-07-06 – 2012-07-10 (×5): 100 mg via ORAL
  Filled 2012-07-06 (×6): qty 1

## 2012-07-06 NOTE — Progress Notes (Signed)
  Subjective: Tolerating drains well. No new complaints. Tolerating diet  Objective: Vital signs in last 24 hours: Temp:  [97.5 F (36.4 C)-97.9 F (36.6 C)] 97.5 F (36.4 C) (07/24 0617) Pulse Rate:  [65-71] 68  (07/24 0617) Resp:  [16-19] 16  (07/24 0617) BP: (101-126)/(59-79) 101/59 mmHg (07/24 0617) SpO2:  [99 %] 99 % (07/24 0617) Last BM Date: 07/04/12  Intake/Output from previous day: 07/23 0701 - 07/24 0700 In: 2820 [P.O.:300; I.V.:1200; IV Piggyback:1300] Out: 2605 [Urine:2335; Drains:270]  PE: drains intact with insertion sites clean and dry.  Mostly yellow serous drainage noted.  Abdomen soft with positive bowel sounds. Mild erythema noted- has decreased per patient.   Drain #1 : deep to mesh : 170 ml output 7/23, 190 ml so far today Drain #2 : ant. To mesh : 30 ml output yesterday, 80 ml so far today recorded   Getting 15 ml flushes to each drain daily.     BMET  Basename 07/06/12 0835 07/04/12 0405  NA -- --  K -- --  CL -- --  CO2 -- --  GLUCOSE -- --  BUN -- --  CREATININE 0.81 1.07  CALCIUM -- --    Anti-infectives: Anti-infectives     Start     Dose/Rate Route Frequency Ordered Stop   07/04/12 1000   vancomycin (VANCOCIN) 1,250 mg in sodium chloride 0.9 % 250 mL IVPB        1,250 mg 166.7 mL/hr over 90 Minutes Intravenous Every 12 hours 07/04/12 0914     07/02/12 2200  piperacillin-tazobactam (ZOSYN) IVPB 3.375 g       3.375 g 12.5 mL/hr over 240 Minutes Intravenous 3 times per day 07/02/12 1416     07/02/12 1800   vancomycin (VANCOCIN) IVPB 1000 mg/200 mL premix  Status:  Discontinued        1,000 mg 200 mL/hr over 60 Minutes Intravenous Every 8 hours 07/02/12 1419 07/04/12 0728   07/02/12 1200  piperacillin-tazobactam (ZOSYN) IVPB 3.375 g       3.375 g 100 mL/hr over 30 Minutes Intravenous  Once 07/02/12 1150     07/02/12 0945   cefTRIAXone (ROCEPHIN) 2 g in dextrose 5 % 50 mL IVPB        2 g 100 mL/hr over 30 Minutes Intravenous   Once 07/02/12 0939 07/02/12 1115   07/02/12 0930   vancomycin (VANCOCIN) injection 1 g  Status:  Discontinued        1 g Intravenous  Once 07/02/12 0926 07/02/12 0928   07/02/12 0930   vancomycin (VANCOCIN) IVPB 1000 mg/200 mL premix        1,000 mg 200 mL/hr over 60 Minutes Intravenous  Once 07/02/12 8295 07/02/12 1045          Assessment/Plan:  Post hernia repair collections x 2 s/p percutaneous drainage - MRSA positive from both. To continue drains, abx and flushes. IR to continue to follow.   LOS: 4 days    Javoni Lucken D 07/06/2012

## 2012-07-06 NOTE — Progress Notes (Signed)
  Subjective: Much more comfortable  Objective: Vital signs in last 24 hours: Temp:  [97.5 F (36.4 C)-97.9 F (36.6 C)] 97.5 F (36.4 C) (07/24 0617) Pulse Rate:  [65-71] 68  (07/24 0617) Resp:  [16-19] 16  (07/24 0617) BP: (101-126)/(59-79) 101/59 mmHg (07/24 0617) SpO2:  [99 %] 99 % (07/24 0617) Last BM Date: 07/04/12  Intake/Output from previous day: 07/23 0701 - 07/24 0700 In: 2820 [P.O.:300; I.V.:1200; IV Piggyback:1300] Out: 2605 [Urine:2335; Drains:270] Intake/Output this shift:    Abdomen softer Drains decreasing  Lab Results:  No results found for this basename: WBC:2,HGB:2,HCT:2,PLT:2 in the last 72 hours BMET  Basename 07/04/12 0405  NA --  K --  CL --  CO2 --  GLUCOSE --  BUN --  CREATININE 1.07  CALCIUM --   PT/INR No results found for this basename: LABPROT:2,INR:2 in the last 72 hours ABG No results found for this basename: PHART:2,PCO2:2,PO2:2,HCO3:2 in the last 72 hours  Studies/Results: No results found.  Anti-infectives: Anti-infectives     Start     Dose/Rate Route Frequency Ordered Stop   07/04/12 1000   vancomycin (VANCOCIN) 1,250 mg in sodium chloride 0.9 % 250 mL IVPB        1,250 mg 166.7 mL/hr over 90 Minutes Intravenous Every 12 hours 07/04/12 0914     07/02/12 2200   piperacillin-tazobactam (ZOSYN) IVPB 3.375 g        3.375 g 12.5 mL/hr over 240 Minutes Intravenous 3 times per day 07/02/12 1416     07/02/12 1800   vancomycin (VANCOCIN) IVPB 1000 mg/200 mL premix  Status:  Discontinued        1,000 mg 200 mL/hr over 60 Minutes Intravenous Every 8 hours 07/02/12 1419 07/04/12 0728   07/02/12 1200   piperacillin-tazobactam (ZOSYN) IVPB 3.375 g        3.375 g 100 mL/hr over 30 Minutes Intravenous  Once 07/02/12 1150     07/02/12 0945   cefTRIAXone (ROCEPHIN) 2 g in dextrose 5 % 50 mL IVPB        2 g 100 mL/hr over 30 Minutes Intravenous  Once 07/02/12 0939 07/02/12 1115   07/02/12 0930   vancomycin (VANCOCIN) injection 1  g  Status:  Discontinued        1 g Intravenous  Once 07/02/12 0926 07/02/12 0928   07/02/12 0930   vancomycin (VANCOCIN) IVPB 1000 mg/200 mL premix        1,000 mg 200 mL/hr over 60 Minutes Intravenous  Once 07/02/12 0929 07/02/12 1045          Assessment/Plan: s/p * No surgery found * Seroma/abscess  Continue drains and IV antibiotics Encourage po Continue current care.  Plan next week to remove mesh vs discharge on long term antibiotics  LOS: 4 days    Noah Medina A 07/06/2012

## 2012-07-06 NOTE — Progress Notes (Addendum)
ANTIBIOTIC CONSULT NOTE - FOLLOW UP  Pharmacy Consult for  Vancomycin / Zosyn Indication:  MRSA seroma / abdominal wall abcess  No Known Allergies  Patient Measurements: Height: 5\' 11"  (180.3 cm) Weight: 162 lb 12.8 oz (73.846 kg) IBW/kg (Calculated) : 75.3   Vital Signs: Temp: 97.5 F (36.4 C) (07/24 0617) Temp src: Oral (07/24 0617) BP: 101/59 mmHg (07/24 0617) Pulse Rate: 68  (07/24 0617) Intake/Output from previous day: 07/23 0701 - 07/24 0700 In: 2820 [P.O.:300; I.V.:1200; IV Piggyback:1300] Out: 2605 [Urine:2335; Drains:270] Intake/Output from this shift: Total I/O In: 240 [P.O.:240] Out: 250 [Urine:250]  Labs:  [facility] 07/06/12 0835 07/04/12 0405  WBC -- --  HGB -- --  PLT -- --  LABCREA -- --  CREATININE 0.81 1.07   Estimated Creatinine Clearance: 106.3 ml/min (by C-G formula based on Cr of 0.81).  Basename 07/06/12 0830 07/04/12 0405 07/03/12 1730  VANCOTROUGH 19.7 -- 50.7*  VANCOPEAK -- -- --  Drue Dun -- 20.2 --  GENTTROUGH -- -- --  GENTPEAK -- -- --  GENTRANDOM -- -- --  TOBRATROUGH -- -- --  TOBRAPEAK -- -- --  TOBRARND -- -- --  AMIKACINPEAK -- -- --  AMIKACINTROU -- -- --  AMIKACIN -- -- --     Microbiology: Recent Results (from the past 720 hour(s))  BODY FLUID CULTURE     Status: Normal   Collection Time   07/02/12 10:55 AM      Component Value Range Status Comment   Specimen Description PERITONEAL CAVITY   Final    Special Requests NONE   Final    Gram Stain     Final    Value: WBC PRESENT, PREDOMINANTLY PMN     GRAM POSITIVE COCCI IN PAIRS   Culture     Final    Value: ABUNDANT METHICILLIN RESISTANT STAPHYLOCOCCUS AUREUS     Note: RIFAMPIN AND GENTAMICIN SHOULD NOT BE USED AS SINGLE DRUGS FOR TREATMENT OF STAPH INFECTIONS. This organism DOES NOT demonstrate inducible Clindamycin resistance in vitro.   Report Status 07/04/2012 FINAL   Final    Organism ID, Bacteria METHICILLIN RESISTANT STAPHYLOCOCCUS AUREUS   Final   CULTURE,  ROUTINE-ABSCESS     Status: Normal   Collection Time   07/02/12  3:21 PM      Component Value Range Status Comment   Specimen Description DRAINAGE   Final    Special Requests Normal   Final    Gram Stain     Final    Value: ABUNDANT WBC PRESENT, PREDOMINANTLY PMN     NO SQUAMOUS EPITHELIAL CELLS SEEN     FEW GRAM POSITIVE COCCI IN CLUSTERS     CRITICAL RESULT CALLED TO, READ BACK BY AND VERIFIED WITH: CRITICAL RESULT CALLED TO, READ BACK BY AND VERIFIED WITH: PATTY CORBITT 6:45PM 07/02/12 BY RUSCA.   Culture     Final    Value: ABUNDANT METHICILLIN RESISTANT STAPHYLOCOCCUS AUREUS     Note: RIFAMPIN AND GENTAMICIN SHOULD NOT BE USED AS SINGLE DRUGS FOR TREATMENT OF STAPH INFECTIONS. This organism DOES NOT demonstrate inducible Clindamycin resistance in vitro. CRITICAL RESULT CALLED TO, READ BACK BY AND VERIFIED WITH: CLARISSA S BY      INGRAM A 7/23 845AM   Report Status 07/05/2012 FINAL   Final    Organism ID, Bacteria METHICILLIN RESISTANT STAPHYLOCOCCUS AUREUS   Final   ANAEROBIC CULTURE     Status: Normal (Preliminary result)   Collection Time   07/02/12  3:21 PM  Component Value Range Status Comment   Specimen Description OTHER ABSCESS DRAINAGE PERITONEAL   Final    Special Requests Normal   Final    Gram Stain PENDING   Incomplete    Culture     Final    Value: NO ANAEROBES ISOLATED; CULTURE IN PROGRESS FOR 5 DAYS   Report Status PENDING   Incomplete     Anti-infectives     Start     Dose/Rate Route Frequency Ordered Stop   07/04/12 1000   vancomycin (VANCOCIN) 1,250 mg in sodium chloride 0.9 % 250 mL IVPB        1,250 mg 166.7 mL/hr over 90 Minutes Intravenous Every 12 hours 07/04/12 0914     07/02/12 2200  piperacillin-tazobactam (ZOSYN) IVPB 3.375 g       3.375 g 12.5 mL/hr over 240 Minutes Intravenous 3 times per day 07/02/12 1416     07/02/12 1800   vancomycin (VANCOCIN) IVPB 1000 mg/200 mL premix  Status:  Discontinued        1,000 mg 200 mL/hr over 60 Minutes  Intravenous Every 8 hours 07/02/12 1419 07/04/12 0728   07/02/12 1200  piperacillin-tazobactam (ZOSYN) IVPB 3.375 g       3.375 g 100 mL/hr over 30 Minutes Intravenous  Once 07/02/12 1150     07/02/12 0945   cefTRIAXone (ROCEPHIN) 2 g in dextrose 5 % 50 mL IVPB        2 g 100 mL/hr over 30 Minutes Intravenous  Once 07/02/12 0939 07/02/12 1115   07/02/12 0930   vancomycin (VANCOCIN) injection 1 g  Status:  Discontinued        1 g Intravenous  Once 07/02/12 0926 07/02/12 0928   07/02/12 0930   vancomycin (VANCOCIN) IVPB 1000 mg/200 mL premix        1,000 mg 200 mL/hr over 60 Minutes Intravenous  Once 07/02/12 1610 07/02/12 1045          Assessment: 56 y/o M with abdominal wall abscess / seroma following ventral hernia repair in April 2013, underwent drainage by IR on 07/02/12. Abscess culture growing abundant MRSA. Now on Day #5 Vancomycin / Zosyn.  Vancomycin trough on 07/03/12 reported as 50.7. RN was uncertain whether this was actually drawn prior to dose because of [facility] downtime and inability to track exact time lab was collected.  Vancomycin was held overnight; the random level on 7/22 was 20.2.  The serum creatinine showed an upward trend 7/20 - 7/21 but has declined to 0.81 today. With a Vancomycin dose of 1250 mg every 12 hours, a trough level drawn this morning is reported as 19.7 mcg/ml - in the upper end of the therapeutic range.  Goal of Therapy:   Vancomycin trough levels 15-20 mcg/ml  Zosyn dosage adjusted to renal function  Eradication of infection  Plan:   Decrease the Vancomycin dose to 1000 mg every 12 hours  Continue the current Zosyn dose - 3.375 grams IV every 8 hours, each dose infused over 4 hours  [employer].Ph. 07/06/2012,1:16 PM

## 2012-07-06 NOTE — Progress Notes (Signed)
[facility] PROGRESS NOTE  Noah Medina ZOX:[id number] DOB: 10/11/1956 DOA: 07/02/2012 PCP: Iona Hansen, NP  Assessment/Plan: Principal Problem:  *Seroma, postoperative, possible abscess Active Problems:  Cirrhosis of liver with history of hepatitis C  Hypokalemia  Severe malnutrition  Ascites  Normocytic anemia  Hyponatremia  MRSA (methicillin resistant staph aureus) culture positive    *Seroma, postoperative, possible abscess / MRSA abscess  Admitted by surgery. Percutaneous drain x 2 placed by IR 07/02/12.  Diagnostic paracentesis done by EDP. Abundant MRSA. Recommend d/c zosyn but will defer to surgeon.  ? mesh removed.  Hyponatremia  Secondary to cirrhosis physiology.  Na+ improved Stop IVF   Normocytic anemia  Likely AOCD. Hemoglobin stable.   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 LFTs not elevated.  Continue diuretic Coags show slight impairment of synthetic function.   Hypokalemia  repleat.   Severe malnutrition  Dietician evaluation performed 07/03/12. Resource supplements ordered.    Code Status: full Family Communication: patient at bedside Disposition Plan: per surgery    HPI/Subjective: Says no BM, otherwise ok No SOB No CP   Objective: Filed Vitals:   07/05/12 0539 07/05/12 1400 07/05/12 2041 07/06/12 0617  BP: 106/60 126/79 103/61 101/59  Pulse: 64 65 71 68  Temp: 97.1 F (36.2 C) 97.9 F (36.6 C) 97.5 F (36.4 C) 97.5 F (36.4 C)  TempSrc: Oral Oral Oral Oral  Resp: 18 19 18 16   Height:      Weight:      SpO2: 96% 99% 99% 99%    Intake/Output Summary (Last 24 hours) at 07/06/12 0840 Last data filed at 07/06/12 0617  Gross per 24 hour  Intake 2820.01 ml  Output   2505 ml  Net 315.01 ml    Exam:  Gen: NAD  Cardiovascular: RRR, No M/R/G  Respiratory: Lungs CTAB  Gastrointestinal: +BS, Abdomen mildly distended. Erythema to the lower abdomen (decreasing per patient). 2  percutaneous drains remain in place.  Extremities: No C/E/C   Data Reviewed: Basic Metabolic Panel:  Lab 07/04/12 8119 07/03/12 0440 07/02/12 0720  NA -- 131* 129*  K -- 3.8 3.4*  CL -- 93* 89*  CO2 -- 30 29  GLUCOSE -- 120* 138*  BUN -- 18 16  CREATININE 1.07 1.14 0.97  CALCIUM -- 8.4 9.1  MG -- -- --  PHOS -- -- --   Liver Function Tests:  Lab 07/03/12 0440 07/02/12 0720  AST 24 29  ALT 11 14  ALKPHOS 62 76  BILITOT 0.9 1.2  PROT 7.1 7.8  ALBUMIN 1.7* 2.1*    Lab 07/02/12 0720  LIPASE 21  AMYLASE --   No results found for this basename: AMMONIA:5 in the last 168 hours CBC:  Lab 07/03/12 0440 07/02/12 0720  WBC 9.1 10.6*  NEUTROABS -- 8.7*  HGB 10.0* 10.4*  HCT 30.7* 30.9*  MCV 87.2 85.8  PLT 211 226   Cardiac Enzymes: No results found for this basename: CKTOTAL:5,CKMB:5,CKMBINDEX:5,TROPONINI:5 in the last 168 hours BNP (last 3 results) No results found for this basename: PROBNP:3 in the last 8760 hours CBG: No results found for this basename: GLUCAP:5 in the last 168 hours  Recent Results (from the past 240 hour(s))  BODY FLUID CULTURE     Status: Normal   Collection Time   07/02/12 10:55 AM      Component Value Range Status Comment   Specimen Description PERITONEAL CAVITY   Final    Special Requests NONE  Final    Gram Stain     Final    Value: WBC PRESENT, PREDOMINANTLY PMN     GRAM POSITIVE COCCI IN PAIRS   Culture     Final    Value: ABUNDANT METHICILLIN RESISTANT STAPHYLOCOCCUS AUREUS     Note: RIFAMPIN AND GENTAMICIN SHOULD NOT BE USED AS SINGLE DRUGS FOR TREATMENT OF STAPH INFECTIONS. This organism DOES NOT demonstrate inducible Clindamycin resistance in vitro.   Report Status 07/04/2012 FINAL   Final    Organism ID, Bacteria METHICILLIN RESISTANT STAPHYLOCOCCUS AUREUS   Final   CULTURE, ROUTINE-ABSCESS     Status: Normal   Collection Time   07/02/12  3:21 PM      Component Value Range Status Comment   Specimen Description DRAINAGE    Final    Special Requests Normal   Final    Gram Stain     Final    Value: ABUNDANT WBC PRESENT, PREDOMINANTLY PMN     NO SQUAMOUS EPITHELIAL CELLS SEEN     FEW GRAM POSITIVE COCCI IN CLUSTERS     CRITICAL RESULT CALLED TO, READ BACK BY AND VERIFIED WITH: CRITICAL RESULT CALLED TO, READ BACK BY AND VERIFIED WITH: PATTY CORBITT 6:45PM 07/02/12 BY RUSCA.   Culture     Final    Value: ABUNDANT METHICILLIN RESISTANT STAPHYLOCOCCUS AUREUS     Note: RIFAMPIN AND GENTAMICIN SHOULD NOT BE USED AS SINGLE DRUGS FOR TREATMENT OF STAPH INFECTIONS. This organism DOES NOT demonstrate inducible Clindamycin resistance in vitro. CRITICAL RESULT CALLED TO, READ BACK BY AND VERIFIED WITH: CLARISSA S BY      INGRAM A 7/23 845AM   Report Status 07/05/2012 FINAL   Final    Organism ID, Bacteria METHICILLIN RESISTANT STAPHYLOCOCCUS AUREUS   Final   ANAEROBIC CULTURE     Status: Normal (Preliminary result)   Collection Time   07/02/12  3:21 PM      Component Value Range Status Comment   Specimen Description OTHER ABSCESS DRAINAGE PERITONEAL   Final    Special Requests Normal   Final    Gram Stain PENDING   Incomplete    Culture     Final    Value: NO ANAEROBES ISOLATED; CULTURE IN PROGRESS FOR 5 DAYS   Report Status PENDING   Incomplete      Studies: Ct Guided Abscess Drain  07/02/2012  *RADIOLOGY REPORT*  Clinical Data: Abdominal wall and intraperitoneal abscess, history of ventral hernia repair with mesh placement.  CT GUIDED INTRA-ABDOMINAL PERITONEAL ABSCESS DRAIN INSERTION CT GUIDED SUPERFICIAL ABDOMINAL WALL ABSCESS DRAIN INSERTION  Date:  07/02/2012 12:35:00  Radiologist:  M. Ruel Favors, M.D.  Medications:  4 mg Versed, 200 mcg Fentanyl  Guidance:  CT  Fluoroscopy time:  None.  Sedation time:  45 minutes  Contrast volume:  None.  Complications:  No immediate  PROCEDURE/FINDINGS:  Informed consent was obtained from the patient following explanation of the procedure, risks, benefits and alternatives. The  patient understands, agrees and consents for the procedure. All questions were addressed.  A time out was performed.  Maximal barrier sterile technique utilized including caps, mask, sterile gowns, sterile gloves, large sterile drape, hand hygiene, and betadine  Previous imaging reviewed. The patient was positioned supine. Noncontrast localization CT performed.  The larger intraperitoneal abscess collection deep to the abdominal mesh was first localized. Under sterile conditions and local anesthesia, a 17 gauge access needle was advanced from a right lower quadrant oblique approach into the deep larger abdominal abscess.  Needle position confirmed with CT.  Syringe aspiration yielded exudative fluid.  Samples sent for gram stain and culture.  Amplatz guide wire inserted followed by tract dilatation to advance a 14-French drain.  Drain catheter position confirmed with CT.  Vacutainer suction yielded 1000-50 ml of exudative fluid.  In a similar fashion, the superficial abdominal wall fluid collection was localized.  Under sterile conditions and local anesthesia, a 17 gauge access needle was advanced superficially into the second abscess collection.  Needle position confirmed with CT.  Syringe aspiration yielded a similar appearing exudative fluid.  Guide wire advanced followed by tract dilatation to insert a 12-French drain.  Syringe aspiration yielded 375 ml of fluid.  Drain catheter secured a Prolene sutures and connected to external gravity drainage.  IMPRESSION: Successful 14-French intra-abdominal abscess drain insertion deep to the abdominal mesh.  Successful 12-French superficial abdominal wall abscess drain insertion anterior to the abdominal mesh  Original Report Authenticated By: Judie Petit. Ruel Favors, M.D.   Ct Guided Abscess Drain  07/02/2012  *RADIOLOGY REPORT*  Clinical Data: Abdominal wall and intraperitoneal abscess, history of ventral hernia repair with mesh placement.  CT GUIDED INTRA-ABDOMINAL  PERITONEAL ABSCESS DRAIN INSERTION CT GUIDED SUPERFICIAL ABDOMINAL WALL ABSCESS DRAIN INSERTION  Date:  07/02/2012 12:35:00  Radiologist:  M. Ruel Favors, M.D.  Medications:  4 mg Versed, 200 mcg Fentanyl  Guidance:  CT  Fluoroscopy time:  None.  Sedation time:  45 minutes  Contrast volume:  None.  Complications:  No immediate  PROCEDURE/FINDINGS:  Informed consent was obtained from the patient following explanation of the procedure, risks, benefits and alternatives. The patient understands, agrees and consents for the procedure. All questions were addressed.  A time out was performed.  Maximal barrier sterile technique utilized including caps, mask, sterile gowns, sterile gloves, large sterile drape, hand hygiene, and betadine  Previous imaging reviewed. The patient was positioned supine. Noncontrast localization CT performed.  The larger intraperitoneal abscess collection deep to the abdominal mesh was first localized. Under sterile conditions and local anesthesia, a 17 gauge access needle was advanced from a right lower quadrant oblique approach into the deep larger abdominal abscess.  Needle position confirmed with CT.  Syringe aspiration yielded exudative fluid.  Samples sent for gram stain and culture.  Amplatz guide wire inserted followed by tract dilatation to advance a 14-French drain.  Drain catheter position confirmed with CT.  Vacutainer suction yielded 1000-50 ml of exudative fluid.  In a similar fashion, the superficial abdominal wall fluid collection was localized.  Under sterile conditions and local anesthesia, a 17 gauge access needle was advanced superficially into the second abscess collection.  Needle position confirmed with CT.  Syringe aspiration yielded a similar appearing exudative fluid.  Guide wire advanced followed by tract dilatation to insert a 12-French drain.  Syringe aspiration yielded 375 ml of fluid.  Drain catheter secured a Prolene sutures and connected to external gravity  drainage.  IMPRESSION: Successful 14-French intra-abdominal abscess drain insertion deep to the abdominal mesh.  Successful 12-French superficial abdominal wall abscess drain insertion anterior to the abdominal mesh  Original Report Authenticated By: Judie Petit. Ruel Favors, M.D.   Ct Abdomen Pelvis W Contrast  07/02/2012  *RADIOLOGY REPORT*  Clinical Data: Abdominal pain and nausea.  Vomiting.  Abdominal distention.  CT ABDOMEN AND PELVIS WITH CONTRAST  Technique:  Multidetector CT imaging of the abdomen and pelvis was performed following the standard protocol during bolus administration of intravenous contrast.  Contrast: OMNIPAQUE IOHEXOL 300 MG/ML  SOLN  Comparison: 07/02/2012 radiographs; ultrasound of 04/21/2012  Findings: Postoperative findings noted at the gastroesophageal junction.  Nodular contour the liver is compatible with cirrhosis.  A 21.3 x 15.8 x 21.5 cm pocket of fluid and gas is present anteriorly within the abdomen, with enhancing margins suspected for suspicious for a large abscess.  I am uncertain of the location of this collection with respect to the hernia mesh, although there is a curved sheet-like plane of gas density central within this collection and oriented coronal and which could represent gas along the hernia mesh; alternatively, the hernia mesh might be posterior to the collection.  However, the collection is thought to be deep to the abdominal wall musculature.  Scattered gas densities raise suspicion for multiloculated.  Additional small pockets of fluid are scattered in the abdomen but are not gas-filled.  Mild splenomegaly noted.  Intrahepatic portal vein branches appear attenuated.  Superior mesenteric vein is prominent and there are extensive varices below the right kidney  Abnormal wall thickening and mild dilatation of the distal duodenum and proximal jejunum noted.  Irregular nodularity / lobularity of the colon is present, especially proximally, and colitis is not  excluded.  Fluid infiltration and mild nodularity along the omentum noted; some of this is likely vascular.  Mild retroperitoneal adenopathy noted, with a left periaortic node measuring 1.3 cm in short axis on image 24 series 2.  This adenopathy may well be reactive.  Urinary bladder unremarkable.  Renal enhancement appears normal. Pancreas and adrenal glands unremarkable.  Possible left unilateral pars defect at L5 noted.  Aortoiliac atherosclerotic vascular disease is present.  IMPRESSION:  1. Very large (21 cm in diameter) collection of gas and fluid in the anterior abdomen, with a flat sheet of gas internally within this structure, suspicious for a large abscess along the hernia mesh. This may be multiloculated given the differing vertical levels of gas within the abscess. 2.  Cirrhotic liver disease with considerable varices. 3.  Mild splenomegaly. 4.  Wall thickening and mucosal enhancement in the colon, query colitis. 5.  Abnormal wall thickening and mild dilatation of the proximal small bowel, probably ileus secondary to the large abscess. 6.  Small pockets of fluid elsewhere in the abdomen are indeterminate with regard to whether they represent abscesses or simply ascites. 7.  Possible left unilateral pars defect at L5.  These results were called by telephone on 07/02/2012 at 9:13 a.m. to Dr. Derwood Kaplan, who verbally acknowledged these results.  Original Report Authenticated By: Dellia Cloud, M.D.   Dg Abd Acute W/chest  07/02/2012  *RADIOLOGY REPORT*  Clinical Data: Cirrhosis, hepatitis C, CHF, nausea, vomiting  ACUTE ABDOMEN SERIES (ABDOMEN 2 VIEW & CHEST 1 VIEW)  Comparison: 12/05/2011  Findings: Normal heart size and vascularity.  Clear lungs. Negative for pneumonia, CHF, effusion or pneumothorax.  Trachea midline.  No free air.  Scattered air and stool throughout the abdomen.  Negative for obstruction, ileus, dilatation, or free air. Increased density of the abdomen compatible with  abdominal ascites. No acute osseous finding.  IMPRESSION: No acute chest process  Negative for obstruction or dilatation  Suspect abdominal ascites  Original Report Authenticated By: Judie Petit. Ruel Favors, M.D.    Scheduled Meds:   . bumetanide  2 mg Oral Daily  . Chlorhexidine Gluconate Cloth  6 each Topical Q0600  . feeding supplement  1 Container Oral TID WC  . multivitamin with minerals  1 tablet Oral Daily  . mupirocin ointment  1 application Nasal BID  .  pantoprazole  40 mg Oral Q1200  . piperacillin-tazobactam  3.375 g Intravenous Once  . piperacillin-tazobactam (ZOSYN)  IV  3.375 g Intravenous Q8H  . psyllium  1 packet Oral BID  . saccharomyces boulardii  250 mg Oral BID  . sodium chloride  3 mL Intravenous Q12H  . spironolactone  100 mg Oral QAC breakfast  . vancomycin  1,250 mg Intravenous Q12H   Continuous Infusions:   . 0.9 % NaCl with KCl 20 mEq / L 50 mL/hr at 07/06/12 0500    Principal Problem:  *Seroma, postoperative, possible abscess Active Problems:  Cirrhosis of liver with history of hepatitis C  Hypokalemia  Severe malnutrition  Ascites  Normocytic anemia  Hyponatremia  MRSA (methicillin resistant staph aureus) culture positive    Time spent: 25    Marlin Canary  [facility] Pager [phone]. 07/06/2012, 8:40 AM  LOS: 4 days

## 2012-07-07 LAB — ANAEROBIC CULTURE

## 2012-07-07 MED ORDER — POLYETHYLENE GLYCOL 3350 17 G PO PACK
17.0000 g | PACK | Freq: Every day | ORAL | Status: DC
  Administered 2012-07-07 – 2012-07-09 (×3): 17 g via ORAL
  Filled 2012-07-07 (×5): qty 1

## 2012-07-07 NOTE — Progress Notes (Signed)
  Subjective: Much more comfortable Drain output decreasing  Objective: Vital signs in last 24 hours: Temp:  [97.7 F (36.5 C)-97.9 F (36.6 C)] 97.9 F (36.6 C) (07/25 0630) Pulse Rate:  [68-70] 70  (07/25 0630) Resp:  [18] 18  (07/25 0630) BP: (110-119)/(61-79) 110/79 mmHg (07/25 0630) SpO2:  [97 %-99 %] 98 % (07/25 0630) Last BM Date: 07/04/12  Intake/Output from previous day: 07/24 0701 - 07/25 0700 In: 1450 [P.O.:1440] Out: 2825 [Urine:2750; Drains:75] Intake/Output this shift:    Abdomen still firm but erythema almost gone Drains serosang  Lab Results:  No results found for this basename: WBC:2,HGB:2,HCT:2,PLT:2 in the last 72 hours BMET  [facility] 07/06/12 0835  NA --  K --  CL --  CO2 --  GLUCOSE --  BUN --  CREATININE 0.81  CALCIUM --   PT/INR No results found for this basename: LABPROT:2,INR:2 in the last 72 hours ABG No results found for this basename: PHART:2,PCO2:2,PO2:2,HCO3:2 in the last 72 hours  Studies/Results: No results found.  Anti-infectives: Anti-infectives     Start     Dose/Rate Route Frequency Ordered Stop   07/06/12 2200   vancomycin (VANCOCIN) IVPB 1000 mg/200 mL premix        1,000 mg 200 mL/hr over 60 Minutes Intravenous Every 12 hours 07/06/12 1334     07/04/12 1000   vancomycin (VANCOCIN) 1,250 mg in sodium chloride 0.9 % 250 mL IVPB  Status:  Discontinued        1,250 mg 166.7 mL/hr over 90 Minutes Intravenous Every 12 hours 07/04/12 0914 07/06/12 1334   07/02/12 2200   piperacillin-tazobactam (ZOSYN) IVPB 3.375 g  Status:  Discontinued        3.375 g 12.5 mL/hr over 240 Minutes Intravenous 3 times per day 07/02/12 1416 07/07/12 0706   07/02/12 1800   vancomycin (VANCOCIN) IVPB 1000 mg/200 mL premix  Status:  Discontinued        1,000 mg 200 mL/hr over 60 Minutes Intravenous Every 8 hours 07/02/12 1419 07/04/12 0728   07/02/12 1200   piperacillin-tazobactam (ZOSYN) IVPB 3.375 g        3.375 g 100 mL/hr over 30  Minutes Intravenous  Once 07/02/12 1150     07/02/12 0945   cefTRIAXone (ROCEPHIN) 2 g in dextrose 5 % 50 mL IVPB        2 g 100 mL/hr over 30 Minutes Intravenous  Once 07/02/12 0939 07/02/12 1115   07/02/12 0930   vancomycin (VANCOCIN) injection 1 g  Status:  Discontinued        1 g Intravenous  Once 07/02/12 0926 07/02/12 0928   07/02/12 0930   vancomycin (VANCOCIN) IVPB 1000 mg/200 mL premix        1,000 mg 200 mL/hr over 60 Minutes Intravenous  Once 07/02/12 0929 07/02/12 1045          Assessment/Plan: 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

## 2012-07-07 NOTE — Progress Notes (Signed)
Subjective: Pt ok. No new c/o  Objective: Physical Exam: BP 110/79  Pulse 70  Temp 97.9 F (36.6 C) (Oral)  Resp 18  Ht 5\' 11"  (1.803 m)  Wt 162 lb 12.8 oz (73.846 kg)  BMI 22.71 kg/m2  SpO2 98% Drains intact, no tenderness or erythema at sites. #1- with 40cc output-serous #2- with 35cc output-serous    Labs: CBC No results found for this basename: WBC:2,HGB:2,HCT:2,PLT:2 in the last 72 hours BMET  [facility] 07/06/12 0835  NA --  K --  CL --  CO2 --  GLUCOSE --  BUN --  CREATININE 0.81  CALCIUM --   LFT No results found for this basename: PROT,ALBUMIN,AST,ALT,ALKPHOS,BILITOT,BILIDIR,IBILI,LIPASE in the last 72 hours PT/INR No results found for this basename: LABPROT:2,INR:2 in the last 72 hours   Studies/Results: No results found.  Assessment/Plan: s/p Abd abscess drain x 2 placed 7/20 , Drain #1 has been dominant but both drains with decreasing output. Pt better  Will follow  Plan per [facility]    LOS: 5 days    Brayton El PA-C 07/07/2012 1:00 PM

## 2012-07-07 NOTE — Progress Notes (Signed)
[facility] PROGRESS NOTE  Noah Medina:[id number] DOB: 03/26/1956 DOA: 07/02/2012 PCP: Iona Hansen, NP  Assessment/Plan: Principal Problem:  *Seroma, postoperative, possible abscess Active Problems:  Cirrhosis of liver with history of hepatitis C  Hypokalemia  Severe malnutrition  Ascites  Normocytic anemia  Hyponatremia  MRSA (methicillin resistant staph aureus) culture positive    Seroma, postoperative, possible abscess / MRSA abscess  Admitted by surgery. Percutaneous drain x 2 placed by IR 07/02/12.  Diagnostic paracentesis done by EDP. Abundant MRSA. Recommend d/c zosyn but will defer to surgeon.  ? mesh removed.  Hyponatremia  Secondary to cirrhosis physiology.  Na+ improved Stop IVF   Normocytic anemia  Likely AOCD. Hemoglobin stable.   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 LFTs not elevated.  Continue diuretic Coags show slight impairment of synthetic function.   Hypokalemia  repleat.   Severe malnutrition  Dietician evaluation performed 07/03/12. Resource supplements ordered.  Constipation - added miralax to regimen -encouraged patient to walk -BMP in AM for K+   Code Status: full Family Communication: patient at bedside Disposition Plan: per surgery    HPI/Subjective: Still no BM No SOB No CP   Objective: Filed Vitals:   07/06/12 0617 07/06/12 1400 07/06/12 2204 07/07/12 0630  BP: 101/59 119/61 114/70 110/79  Pulse: 68 70 68 70  Temp: 97.5 F (36.4 C) 97.9 F (36.6 C) 97.7 F (36.5 C) 97.9 F (36.6 C)  TempSrc: Oral Oral Oral Oral  Resp: 16 18 18 18   Height:      Weight:      SpO2: 99% 97% 99% 98%    Intake/Output Summary (Last 24 hours) at 07/07/12 0854 Last data filed at 07/07/12 0645  Gross per 24 hour  Intake   1450 ml  Output   2825 ml  Net  -1375 ml    Exam:  Gen: NAD  Cardiovascular: RRR, No M/R/G  Respiratory: Lungs CTAB  Gastrointestinal: +BS, Abdomen  mildly distended. Erythema to the lower abdomen (decreasing per patient). 2 percutaneous drains remain in place.  Extremities: No C/E/C   Data Reviewed: Basic Metabolic Panel:  Lab 07/06/12 8657 07/04/12 0405 07/03/12 0440 07/02/12 0720  NA -- -- 131* 129*  K -- -- 3.8 3.4*  CL -- -- 93* 89*  CO2 -- -- 30 29  GLUCOSE -- -- 120* 138*  BUN -- -- 18 16  CREATININE 0.81 1.07 1.14 0.97  CALCIUM -- -- 8.4 9.1  MG -- -- -- --  PHOS -- -- -- --   Liver Function Tests:  Lab 07/03/12 0440 07/02/12 0720  AST 24 29  ALT 11 14  ALKPHOS 62 76  BILITOT 0.9 1.2  PROT 7.1 7.8  ALBUMIN 1.7* 2.1*    Lab 07/02/12 0720  LIPASE 21  AMYLASE --   No results found for this basename: AMMONIA:5 in the last 168 hours CBC:  Lab 07/03/12 0440 07/02/12 0720  WBC 9.1 10.6*  NEUTROABS -- 8.7*  HGB 10.0* 10.4*  HCT 30.7* 30.9*  MCV 87.2 85.8  PLT 211 226   Cardiac Enzymes: No results found for this basename: CKTOTAL:5,CKMB:5,CKMBINDEX:5,TROPONINI:5 in the last 168 hours BNP (last 3 results) No results found for this basename: PROBNP:3 in the last 8760 hours CBG: No results found for this basename: GLUCAP:5 in the last 168 hours  Recent Results (from the past 240 hour(s))  BODY FLUID CULTURE     Status: Normal   Collection Time  07/02/12 10:55 AM      Component Value Range Status Comment   Specimen Description PERITONEAL CAVITY   Final    Special Requests NONE   Final    Gram Stain     Final    Value: WBC PRESENT, PREDOMINANTLY PMN     GRAM POSITIVE COCCI IN PAIRS   Culture     Final    Value: ABUNDANT METHICILLIN RESISTANT STAPHYLOCOCCUS AUREUS     Note: RIFAMPIN AND GENTAMICIN SHOULD NOT BE USED AS SINGLE DRUGS FOR TREATMENT OF STAPH INFECTIONS. This organism DOES NOT demonstrate inducible Clindamycin resistance in vitro.   Report Status 07/04/2012 FINAL   Final    Organism ID, Bacteria METHICILLIN RESISTANT STAPHYLOCOCCUS AUREUS   Final   CULTURE, ROUTINE-ABSCESS     Status: Normal    Collection Time   07/02/12  3:21 PM      Component Value Range Status Comment   Specimen Description DRAINAGE   Final    Special Requests Normal   Final    Gram Stain     Final    Value: ABUNDANT WBC PRESENT, PREDOMINANTLY PMN     NO SQUAMOUS EPITHELIAL CELLS SEEN     FEW GRAM POSITIVE COCCI IN CLUSTERS     CRITICAL RESULT CALLED TO, READ BACK BY AND VERIFIED WITH: CRITICAL RESULT CALLED TO, READ BACK BY AND VERIFIED WITH: PATTY CORBITT 6:45PM 07/02/12 BY RUSCA.   Culture     Final    Value: ABUNDANT METHICILLIN RESISTANT STAPHYLOCOCCUS AUREUS     Note: RIFAMPIN AND GENTAMICIN SHOULD NOT BE USED AS SINGLE DRUGS FOR TREATMENT OF STAPH INFECTIONS. This organism DOES NOT demonstrate inducible Clindamycin resistance in vitro. CRITICAL RESULT CALLED TO, READ BACK BY AND VERIFIED WITH: CLARISSA S BY      INGRAM A 7/23 845AM   Report Status 07/05/2012 FINAL   Final    Organism ID, Bacteria METHICILLIN RESISTANT STAPHYLOCOCCUS AUREUS   Final   ANAEROBIC CULTURE     Status: Normal (Preliminary result)   Collection Time   07/02/12  3:21 PM      Component Value Range Status Comment   Specimen Description OTHER ABSCESS DRAINAGE PERITONEAL   Final    Special Requests Normal   Final    Gram Stain PENDING   Incomplete    Culture     Final    Value: NO ANAEROBES ISOLATED; CULTURE IN PROGRESS FOR 5 DAYS   Report Status PENDING   Incomplete      Studies: Ct Guided Abscess Drain  07/02/2012  *RADIOLOGY REPORT*  Clinical Data: Abdominal wall and intraperitoneal abscess, history of ventral hernia repair with mesh placement.  CT GUIDED INTRA-ABDOMINAL PERITONEAL ABSCESS DRAIN INSERTION CT GUIDED SUPERFICIAL ABDOMINAL WALL ABSCESS DRAIN INSERTION  Date:  07/02/2012 12:35:00  Radiologist:  M. Ruel Favors, M.D.  Medications:  4 mg Versed, 200 mcg Fentanyl  Guidance:  CT  Fluoroscopy time:  None.  Sedation time:  45 minutes  Contrast volume:  None.  Complications:  No immediate  PROCEDURE/FINDINGS:  Informed  consent was obtained from the patient following explanation of the procedure, risks, benefits and alternatives. The patient understands, agrees and consents for the procedure. All questions were addressed.  A time out was performed.  Maximal barrier sterile technique utilized including caps, mask, sterile gowns, sterile gloves, large sterile drape, hand hygiene, and betadine  Previous imaging reviewed. The patient was positioned supine. Noncontrast localization CT performed.  The larger intraperitoneal abscess collection deep to the abdominal  mesh was first localized. Under sterile conditions and local anesthesia, a 17 gauge access needle was advanced from a right lower quadrant oblique approach into the deep larger abdominal abscess.  Needle position confirmed with CT.  Syringe aspiration yielded exudative fluid.  Samples sent for gram stain and culture.  Amplatz guide wire inserted followed by tract dilatation to advance a 14-French drain.  Drain catheter position confirmed with CT.  Vacutainer suction yielded 1000-50 ml of exudative fluid.  In a similar fashion, the superficial abdominal wall fluid collection was localized.  Under sterile conditions and local anesthesia, a 17 gauge access needle was advanced superficially into the second abscess collection.  Needle position confirmed with CT.  Syringe aspiration yielded a similar appearing exudative fluid.  Guide wire advanced followed by tract dilatation to insert a 12-French drain.  Syringe aspiration yielded 375 ml of fluid.  Drain catheter secured a Prolene sutures and connected to external gravity drainage.  IMPRESSION: Successful 14-French intra-abdominal abscess drain insertion deep to the abdominal mesh.  Successful 12-French superficial abdominal wall abscess drain insertion anterior to the abdominal mesh  Original Report Authenticated By: Judie Petit. Ruel Favors, M.D.   Ct Guided Abscess Drain  07/02/2012  *RADIOLOGY REPORT*  Clinical Data: Abdominal wall  and intraperitoneal abscess, history of ventral hernia repair with mesh placement.  CT GUIDED INTRA-ABDOMINAL PERITONEAL ABSCESS DRAIN INSERTION CT GUIDED SUPERFICIAL ABDOMINAL WALL ABSCESS DRAIN INSERTION  Date:  07/02/2012 12:35:00  Radiologist:  M. Ruel Favors, M.D.  Medications:  4 mg Versed, 200 mcg Fentanyl  Guidance:  CT  Fluoroscopy time:  None.  Sedation time:  45 minutes  Contrast volume:  None.  Complications:  No immediate  PROCEDURE/FINDINGS:  Informed consent was obtained from the patient following explanation of the procedure, risks, benefits and alternatives. The patient understands, agrees and consents for the procedure. All questions were addressed.  A time out was performed.  Maximal barrier sterile technique utilized including caps, mask, sterile gowns, sterile gloves, large sterile drape, hand hygiene, and betadine  Previous imaging reviewed. The patient was positioned supine. Noncontrast localization CT performed.  The larger intraperitoneal abscess collection deep to the abdominal mesh was first localized. Under sterile conditions and local anesthesia, a 17 gauge access needle was advanced from a right lower quadrant oblique approach into the deep larger abdominal abscess.  Needle position confirmed with CT.  Syringe aspiration yielded exudative fluid.  Samples sent for gram stain and culture.  Amplatz guide wire inserted followed by tract dilatation to advance a 14-French drain.  Drain catheter position confirmed with CT.  Vacutainer suction yielded 1000-50 ml of exudative fluid.  In a similar fashion, the superficial abdominal wall fluid collection was localized.  Under sterile conditions and local anesthesia, a 17 gauge access needle was advanced superficially into the second abscess collection.  Needle position confirmed with CT.  Syringe aspiration yielded a similar appearing exudative fluid.  Guide wire advanced followed by tract dilatation to insert a 12-French drain.  Syringe  aspiration yielded 375 ml of fluid.  Drain catheter secured a Prolene sutures and connected to external gravity drainage.  IMPRESSION: Successful 14-French intra-abdominal abscess drain insertion deep to the abdominal mesh.  Successful 12-French superficial abdominal wall abscess drain insertion anterior to the abdominal mesh  Original Report Authenticated By: Judie Petit. Ruel Favors, M.D.   Ct Abdomen Pelvis W Contrast  07/02/2012  *RADIOLOGY REPORT*  Clinical Data: Abdominal pain and nausea.  Vomiting.  Abdominal distention.  CT ABDOMEN AND PELVIS WITH CONTRAST  Technique:  Multidetector CT imaging of the abdomen and pelvis was performed following the standard protocol during bolus administration of intravenous contrast.  Contrast: OMNIPAQUE IOHEXOL 300 MG/ML  SOLN  Comparison: 07/02/2012 radiographs; ultrasound of 04/21/2012  Findings: Postoperative findings noted at the gastroesophageal junction.  Nodular contour the liver is compatible with cirrhosis.  A 21.3 x 15.8 x 21.5 cm pocket of fluid and gas is present anteriorly within the abdomen, with enhancing margins suspected for suspicious for a large abscess.  I am uncertain of the location of this collection with respect to the hernia mesh, although there is a curved sheet-like plane of gas density central within this collection and oriented coronal and which could represent gas along the hernia mesh; alternatively, the hernia mesh might be posterior to the collection.  However, the collection is thought to be deep to the abdominal wall musculature.  Scattered gas densities raise suspicion for multiloculated.  Additional small pockets of fluid are scattered in the abdomen but are not gas-filled.  Mild splenomegaly noted.  Intrahepatic portal vein branches appear attenuated.  Superior mesenteric vein is prominent and there are extensive varices below the right kidney  Abnormal wall thickening and mild dilatation of the distal duodenum and proximal jejunum  noted.  Irregular nodularity / lobularity of the colon is present, especially proximally, and colitis is not excluded.  Fluid infiltration and mild nodularity along the omentum noted; some of this is likely vascular.  Mild retroperitoneal adenopathy noted, with a left periaortic node measuring 1.3 cm in short axis on image 24 series 2.  This adenopathy may well be reactive.  Urinary bladder unremarkable.  Renal enhancement appears normal. Pancreas and adrenal glands unremarkable.  Possible left unilateral pars defect at L5 noted.  Aortoiliac atherosclerotic vascular disease is present.  IMPRESSION:  1. Very large (21 cm in diameter) collection of gas and fluid in the anterior abdomen, with a flat sheet of gas internally within this structure, suspicious for a large abscess along the hernia mesh. This may be multiloculated given the differing vertical levels of gas within the abscess. 2.  Cirrhotic liver disease with considerable varices. 3.  Mild splenomegaly. 4.  Wall thickening and mucosal enhancement in the colon, query colitis. 5.  Abnormal wall thickening and mild dilatation of the proximal small bowel, probably ileus secondary to the large abscess. 6.  Small pockets of fluid elsewhere in the abdomen are indeterminate with regard to whether they represent abscesses or simply ascites. 7.  Possible left unilateral pars defect at L5.  These results were called by telephone on 07/02/2012 at 9:13 a.m. to Dr. Derwood Kaplan, who verbally acknowledged these results.  Original Report Authenticated By: Dellia Cloud, M.D.   Dg Abd Acute W/chest  07/02/2012  *RADIOLOGY REPORT*  Clinical Data: Cirrhosis, hepatitis C, CHF, nausea, vomiting  ACUTE ABDOMEN SERIES (ABDOMEN 2 VIEW & CHEST 1 VIEW)  Comparison: 12/05/2011  Findings: Normal heart size and vascularity.  Clear lungs. Negative for pneumonia, CHF, effusion or pneumothorax.  Trachea midline.  No free air.  Scattered air and stool throughout the abdomen.   Negative for obstruction, ileus, dilatation, or free air. Increased density of the abdomen compatible with abdominal ascites. No acute osseous finding.  IMPRESSION: No acute chest process  Negative for obstruction or dilatation  Suspect abdominal ascites  Original Report Authenticated By: Judie Petit. Ruel Favors, M.D.    Scheduled Meds:    . bumetanide  2 mg Oral Daily  . Chlorhexidine Gluconate Cloth  6  each Topical Q0600  . docusate sodium  100 mg Oral Daily  . feeding supplement  237 mL Oral BID BM  . multivitamin with minerals  1 tablet Oral Daily  . mupirocin ointment  1 application Nasal BID  . pantoprazole  40 mg Oral Q1200  . piperacillin-tazobactam  3.375 g Intravenous Once  . polyethylene glycol  17 g Oral Daily  . psyllium  1 packet Oral BID  . saccharomyces boulardii  250 mg Oral BID  . sodium chloride  3 mL Intravenous Q12H  . spironolactone  100 mg Oral QAC breakfast  . vancomycin  1,000 mg Intravenous Q12H  . DISCONTD: feeding supplement  1 Container Oral TID WC  . DISCONTD: piperacillin-tazobactam (ZOSYN)  IV  3.375 g Intravenous Q8H  . DISCONTD: vancomycin  1,250 mg Intravenous Q12H   Continuous Infusions:   Principal Problem:  *Seroma, postoperative, possible abscess Active Problems:  Cirrhosis of liver with history of hepatitis C  Hypokalemia  Severe malnutrition  Ascites  Normocytic anemia  Hyponatremia  MRSA (methicillin resistant staph aureus) culture positive    Time spent: 25    Marlin Canary  [facility] Pager [phone]. 07/07/2012, 8:54 AM  LOS: 5 days

## 2012-07-08 DIAGNOSIS — E876 Hypokalemia: Secondary | ICD-10-CM

## 2012-07-08 LAB — BASIC METABOLIC PANEL
BUN: 11 mg/dL (ref 6–23)
Creatinine, Ser: 0.85 mg/dL (ref 0.50–1.35)
GFR calc Af Amer: >90 mL/min (ref >90)
GFR calc non Af Amer: >90 mL/min (ref >90)

## 2012-07-08 MED ORDER — POTASSIUM CHLORIDE CRYS ER 20 MEQ PO TBCR
40.0000 meq | EXTENDED_RELEASE_TABLET | Freq: Once | ORAL | Status: AC
  Administered 2012-07-08: 40 meq via ORAL
  Filled 2012-07-08: qty 2

## 2012-07-08 NOTE — Progress Notes (Signed)
  Subjective: More comfortable. Without new complaints.   Objective: Vital signs in last 24 hours: Temp:  [98.1 F (36.7 C)-99 F (37.2 C)] 98.3 F (36.8 C) (07/26 0600) Pulse Rate:  [67-79] 75  (07/26 0600) Resp:  [18] 18  (07/26 0600) BP: (116-121)/(55-68) 118/68 mmHg (07/26 0600) SpO2:  [96 %-100 %] 96 % (07/26 0600) Last BM Date: 07/04/12  Intake/Output from previous day: 07/25 0701 - 07/26 0700 In: 1715 [P.O.:1440; IV Piggyback:200] Out: 3550 [Urine:3500; Drains:50] Intake/Output this shift: Total I/O In: 243 [P.O.:240; I.V.:3] Out: 200 [Urine:200]   PE:  Both percutaneous drains intact with insertion sites clean and dry. Mostly serous appearing output. 14 F catheter with 30 ml output 7/25, 12 F catheter with 20 ml output 7/25.  MRSA growth from both sites. Less tenderness to abdomen with mild erythema.   BMET  Basename 07/08/12 0330 07/06/12 0835  NA 126* --  K 3.2* --  CL 90* --  CO2 31 --  GLUCOSE 109* --  BUN 11 --  CREATININE 0.85 0.81  CALCIUM 8.0* --    Anti-infectives: Anti-infectives     Start     Dose/Rate Route Frequency Ordered Stop   07/06/12 2200   vancomycin (VANCOCIN) IVPB 1000 mg/200 mL premix        1,000 mg 200 mL/hr over 60 Minutes Intravenous Every 12 hours 07/06/12 1334     07/04/12 1000   vancomycin (VANCOCIN) 1,250 mg in sodium chloride 0.9 % 250 mL IVPB  Status:  Discontinued        1,250 mg 166.7 mL/hr over 90 Minutes Intravenous Every 12 hours 07/04/12 0914 07/06/12 1334   07/02/12 2200   piperacillin-tazobactam (ZOSYN) IVPB 3.375 g  Status:  Discontinued        3.375 g 12.5 mL/hr over 240 Minutes Intravenous 3 times per day 07/02/12 1416 07/07/12 0706   07/02/12 1800   vancomycin (VANCOCIN) IVPB 1000 mg/200 mL premix  Status:  Discontinued        1,000 mg 200 mL/hr over 60 Minutes Intravenous Every 8 hours 07/02/12 1419 07/04/12 0728   07/02/12 1200   piperacillin-tazobactam (ZOSYN) IVPB 3.375 g  Status:  Discontinued       3.375 g 100 mL/hr over 30 Minutes Intravenous  Once 07/02/12 1150 07/07/12 1311   07/02/12 0945   cefTRIAXone (ROCEPHIN) 2 g in dextrose 5 % 50 mL IVPB        2 g 100 mL/hr over 30 Minutes Intravenous  Once 07/02/12 0939 07/02/12 1115   07/02/12 0930   vancomycin (VANCOCIN) injection 1 g  Status:  Discontinued        1 g Intravenous  Once 07/02/12 0926 07/02/12 0928   07/02/12 0930   vancomycin (VANCOCIN) IVPB 1000 mg/200 mL premix        1,000 mg 200 mL/hr over 60 Minutes Intravenous  Once 07/02/12 1610 07/02/12 1045          Assessment/Plan: Fluid collections post hernia repair s/p percutaneous drainage by IR 07/02/12. MRSA positive from both - currently on IV anti-biotics.  Drains to remain for now - output declining- will re-image once output minimal above flushes prior to removal. Will follow.      LOS: 6 days    Marchel Foote D 07/08/2012

## 2012-07-08 NOTE — Progress Notes (Signed)
[facility] PROGRESS NOTE  Noah Medina JYN:[id number] DOB: Oct 14, 1956 DOA: 07/02/2012 PCP: Iona Hansen, NP  Assessment/Plan: Principal Problem:  *Seroma, postoperative, possible abscess Active Problems:  Cirrhosis of liver with history of hepatitis C  Hypokalemia  Severe malnutrition  Ascites  Normocytic anemia  Hyponatremia  MRSA (methicillin resistant staph aureus) culture positive    Seroma, postoperative, possible abscess / MRSA abscess  Admitted by surgery. Percutaneous drain x 2 placed by IR 07/02/12.  Diagnostic paracentesis done by EDP. Abundant MRSA. Recommend d/c zosyn but will defer to surgeon.  ? mesh removed.  Hyponatremia  Secondary to cirrhosis physiology.  Na+ improved Fluid restrict   Normocytic anemia  Likely AOCD. Hemoglobin stable.   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 LFTs not elevated.  Continue diuretic Coags show slight impairment of synthetic function.   Hypokalemia  repleat.   Severe malnutrition  Dietician evaluation performed 07/03/12. Resource supplements ordered.  Constipation - added miralax to regimen -encouraged patient to walk   -BMP on Sunday again   Code Status: full Family Communication: patient at bedside Disposition Plan: per surgery    HPI/Subjective: Small BM this AM No fever, no chills Says he drinks a lot of water   Objective: Filed Vitals:   07/07/12 0630 07/07/12 1400 07/07/12 2206 07/08/12 0600  BP: 110/79 116/55 121/60 118/68  Pulse: 70 79 67 75  Temp: 97.9 F (36.6 C) 98.1 F (36.7 C) 99 F (37.2 C) 98.3 F (36.8 C)  TempSrc: Oral Oral Oral Oral  Resp: 18 18 18 18   Height:      Weight:      SpO2: 98% 100% 98% 96%    Intake/Output Summary (Last 24 hours) at 07/08/12 0814 Last data filed at 07/08/12 0600  Gross per 24 hour  Intake   1475 ml  Output   3550 ml  Net  -2075 ml    Exam:  Gen: NAD  Cardiovascular: RRR, No M/R/G    Respiratory: Lungs CTAB  Gastrointestinal: +BS, Abdomen mildly distended. Erythema to the lower abdomen (decreasing per patient). 2 percutaneous drains remain in place.  Extremities: No C/E/C   Data Reviewed: Basic Metabolic Panel:  Lab 07/08/12 8657 07/06/12 0835 07/04/12 0405 07/03/12 0440 07/02/12 0720  NA 126* -- -- 131* 129*  K 3.2* -- -- 3.8 3.4*  CL 90* -- -- 93* 89*  CO2 31 -- -- 30 29  GLUCOSE 109* -- -- 120* 138*  BUN 11 -- -- 18 16  CREATININE 0.85 0.81 1.07 1.14 0.97  CALCIUM 8.0* -- -- 8.4 9.1  MG -- -- -- -- --  PHOS -- -- -- -- --   Liver Function Tests:  Lab 07/03/12 0440 07/02/12 0720  AST 24 29  ALT 11 14  ALKPHOS 62 76  BILITOT 0.9 1.2  PROT 7.1 7.8  ALBUMIN 1.7* 2.1*    Lab 07/02/12 0720  LIPASE 21  AMYLASE --   No results found for this basename: AMMONIA:5 in the last 168 hours CBC:  Lab 07/03/12 0440 07/02/12 0720  WBC 9.1 10.6*  NEUTROABS -- 8.7*  HGB 10.0* 10.4*  HCT 30.7* 30.9*  MCV 87.2 85.8  PLT 211 226   Cardiac Enzymes: No results found for this basename: CKTOTAL:5,CKMB:5,CKMBINDEX:5,TROPONINI:5 in the last 168 hours BNP (last 3 results) No results found for this basename: PROBNP:3 in the last 8760 hours CBG: No results found for this basename: GLUCAP:5 in the last 168 hours  Recent  Results (from the past 240 hour(s))  BODY FLUID CULTURE     Status: Normal   Collection Time   07/02/12 10:55 AM      Component Value Range Status Comment   Specimen Description PERITONEAL CAVITY   Final    Special Requests NONE   Final    Gram Stain     Final    Value: WBC PRESENT, PREDOMINANTLY PMN     GRAM POSITIVE COCCI IN PAIRS   Culture     Final    Value: ABUNDANT METHICILLIN RESISTANT STAPHYLOCOCCUS AUREUS     Note: RIFAMPIN AND GENTAMICIN SHOULD NOT BE USED AS SINGLE DRUGS FOR TREATMENT OF STAPH INFECTIONS. This organism DOES NOT demonstrate inducible Clindamycin resistance in vitro.   Report Status 07/04/2012 FINAL   Final     Organism ID, Bacteria METHICILLIN RESISTANT STAPHYLOCOCCUS AUREUS   Final   CULTURE, ROUTINE-ABSCESS     Status: Normal   Collection Time   07/02/12  3:21 PM      Component Value Range Status Comment   Specimen Description DRAINAGE   Final    Special Requests Normal   Final    Gram Stain     Final    Value: ABUNDANT WBC PRESENT, PREDOMINANTLY PMN     NO SQUAMOUS EPITHELIAL CELLS SEEN     FEW GRAM POSITIVE COCCI IN CLUSTERS     CRITICAL RESULT CALLED TO, READ BACK BY AND VERIFIED WITH: CRITICAL RESULT CALLED TO, READ BACK BY AND VERIFIED WITH: PATTY CORBITT 6:45PM 07/02/12 BY RUSCA.   Culture     Final    Value: ABUNDANT METHICILLIN RESISTANT STAPHYLOCOCCUS AUREUS     Note: RIFAMPIN AND GENTAMICIN SHOULD NOT BE USED AS SINGLE DRUGS FOR TREATMENT OF STAPH INFECTIONS. This organism DOES NOT demonstrate inducible Clindamycin resistance in vitro. CRITICAL RESULT CALLED TO, READ BACK BY AND VERIFIED WITH: CLARISSA S BY      INGRAM A 7/23 845AM   Report Status 07/05/2012 FINAL   Final    Organism ID, Bacteria METHICILLIN RESISTANT STAPHYLOCOCCUS AUREUS   Final   ANAEROBIC CULTURE     Status: Normal   Collection Time   07/02/12  3:21 PM      Component Value Range Status Comment   Specimen Description OTHER ABSCESS DRAINAGE PERITONEAL   Final    Special Requests Normal   Final    Gram Stain     Final    Value: ABUNDANT WBC PRESENT, PREDOMINANTLY PMN     NO SQUAMOUS EPITHELIAL CELLS SEEN     FEW GRAM POSITIVE COCCI IN CLUSTERS   Culture NO ANAEROBES ISOLATED   Final    Report Status 07/07/2012 FINAL   Final      Studies: Ct Guided Abscess Drain  07/02/2012  *RADIOLOGY REPORT*  Clinical Data: Abdominal wall and intraperitoneal abscess, history of ventral hernia repair with mesh placement.  CT GUIDED INTRA-ABDOMINAL PERITONEAL ABSCESS DRAIN INSERTION CT GUIDED SUPERFICIAL ABDOMINAL WALL ABSCESS DRAIN INSERTION  Date:  07/02/2012 12:35:00  Radiologist:  M. Ruel Favors, M.D.  Medications:  4 mg  Versed, 200 mcg Fentanyl  Guidance:  CT  Fluoroscopy time:  None.  Sedation time:  45 minutes  Contrast volume:  None.  Complications:  No immediate  PROCEDURE/FINDINGS:  Informed consent was obtained from the patient following explanation of the procedure, risks, benefits and alternatives. The patient understands, agrees and consents for the procedure. All questions were addressed.  A time out was performed.  Maximal barrier sterile technique  utilized including caps, mask, sterile gowns, sterile gloves, large sterile drape, hand hygiene, and betadine  Previous imaging reviewed. The patient was positioned supine. Noncontrast localization CT performed.  The larger intraperitoneal abscess collection deep to the abdominal mesh was first localized. Under sterile conditions and local anesthesia, a 17 gauge access needle was advanced from a right lower quadrant oblique approach into the deep larger abdominal abscess.  Needle position confirmed with CT.  Syringe aspiration yielded exudative fluid.  Samples sent for gram stain and culture.  Amplatz guide wire inserted followed by tract dilatation to advance a 14-French drain.  Drain catheter position confirmed with CT.  Vacutainer suction yielded 1000-50 ml of exudative fluid.  In a similar fashion, the superficial abdominal wall fluid collection was localized.  Under sterile conditions and local anesthesia, a 17 gauge access needle was advanced superficially into the second abscess collection.  Needle position confirmed with CT.  Syringe aspiration yielded a similar appearing exudative fluid.  Guide wire advanced followed by tract dilatation to insert a 12-French drain.  Syringe aspiration yielded 375 ml of fluid.  Drain catheter secured a Prolene sutures and connected to external gravity drainage.  IMPRESSION: Successful 14-French intra-abdominal abscess drain insertion deep to the abdominal mesh.  Successful 12-French superficial abdominal wall abscess drain insertion  anterior to the abdominal mesh  Original Report Authenticated By: Judie Petit. Ruel Favors, M.D.   Ct Guided Abscess Drain  07/02/2012  *RADIOLOGY REPORT*  Clinical Data: Abdominal wall and intraperitoneal abscess, history of ventral hernia repair with mesh placement.  CT GUIDED INTRA-ABDOMINAL PERITONEAL ABSCESS DRAIN INSERTION CT GUIDED SUPERFICIAL ABDOMINAL WALL ABSCESS DRAIN INSERTION  Date:  07/02/2012 12:35:00  Radiologist:  M. Ruel Favors, M.D.  Medications:  4 mg Versed, 200 mcg Fentanyl  Guidance:  CT  Fluoroscopy time:  None.  Sedation time:  45 minutes  Contrast volume:  None.  Complications:  No immediate  PROCEDURE/FINDINGS:  Informed consent was obtained from the patient following explanation of the procedure, risks, benefits and alternatives. The patient understands, agrees and consents for the procedure. All questions were addressed.  A time out was performed.  Maximal barrier sterile technique utilized including caps, mask, sterile gowns, sterile gloves, large sterile drape, hand hygiene, and betadine  Previous imaging reviewed. The patient was positioned supine. Noncontrast localization CT performed.  The larger intraperitoneal abscess collection deep to the abdominal mesh was first localized. Under sterile conditions and local anesthesia, a 17 gauge access needle was advanced from a right lower quadrant oblique approach into the deep larger abdominal abscess.  Needle position confirmed with CT.  Syringe aspiration yielded exudative fluid.  Samples sent for gram stain and culture.  Amplatz guide wire inserted followed by tract dilatation to advance a 14-French drain.  Drain catheter position confirmed with CT.  Vacutainer suction yielded 1000-50 ml of exudative fluid.  In a similar fashion, the superficial abdominal wall fluid collection was localized.  Under sterile conditions and local anesthesia, a 17 gauge access needle was advanced superficially into the second abscess collection.  Needle position  confirmed with CT.  Syringe aspiration yielded a similar appearing exudative fluid.  Guide wire advanced followed by tract dilatation to insert a 12-French drain.  Syringe aspiration yielded 375 ml of fluid.  Drain catheter secured a Prolene sutures and connected to external gravity drainage.  IMPRESSION: Successful 14-French intra-abdominal abscess drain insertion deep to the abdominal mesh.  Successful 12-French superficial abdominal wall abscess drain insertion anterior to the abdominal mesh  Original Report  Authenticated By: Judie Petit. Ruel Favors, M.D.   Ct Abdomen Pelvis W Contrast  07/02/2012  *RADIOLOGY REPORT*  Clinical Data: Abdominal pain and nausea.  Vomiting.  Abdominal distention.  CT ABDOMEN AND PELVIS WITH CONTRAST  Technique:  Multidetector CT imaging of the abdomen and pelvis was performed following the standard protocol during bolus administration of intravenous contrast.  Contrast: OMNIPAQUE IOHEXOL 300 MG/ML  SOLN  Comparison: 07/02/2012 radiographs; ultrasound of 04/21/2012  Findings: Postoperative findings noted at the gastroesophageal junction.  Nodular contour the liver is compatible with cirrhosis.  A 21.3 x 15.8 x 21.5 cm pocket of fluid and gas is present anteriorly within the abdomen, with enhancing margins suspected for suspicious for a large abscess.  I am uncertain of the location of this collection with respect to the hernia mesh, although there is a curved sheet-like plane of gas density central within this collection and oriented coronal and which could represent gas along the hernia mesh; alternatively, the hernia mesh might be posterior to the collection.  However, the collection is thought to be deep to the abdominal wall musculature.  Scattered gas densities raise suspicion for multiloculated.  Additional small pockets of fluid are scattered in the abdomen but are not gas-filled.  Mild splenomegaly noted.  Intrahepatic portal vein branches appear attenuated.  Superior  mesenteric vein is prominent and there are extensive varices below the right kidney  Abnormal wall thickening and mild dilatation of the distal duodenum and proximal jejunum noted.  Irregular nodularity / lobularity of the colon is present, especially proximally, and colitis is not excluded.  Fluid infiltration and mild nodularity along the omentum noted; some of this is likely vascular.  Mild retroperitoneal adenopathy noted, with a left periaortic node measuring 1.3 cm in short axis on image 24 series 2.  This adenopathy may well be reactive.  Urinary bladder unremarkable.  Renal enhancement appears normal. Pancreas and adrenal glands unremarkable.  Possible left unilateral pars defect at L5 noted.  Aortoiliac atherosclerotic vascular disease is present.  IMPRESSION:  1. Very large (21 cm in diameter) collection of gas and fluid in the anterior abdomen, with a flat sheet of gas internally within this structure, suspicious for a large abscess along the hernia mesh. This may be multiloculated given the differing vertical levels of gas within the abscess. 2.  Cirrhotic liver disease with considerable varices. 3.  Mild splenomegaly. 4.  Wall thickening and mucosal enhancement in the colon, query colitis. 5.  Abnormal wall thickening and mild dilatation of the proximal small bowel, probably ileus secondary to the large abscess. 6.  Small pockets of fluid elsewhere in the abdomen are indeterminate with regard to whether they represent abscesses or simply ascites. 7.  Possible left unilateral pars defect at L5.  These results were called by telephone on 07/02/2012 at 9:13 a.m. to Dr. Derwood Kaplan, who verbally acknowledged these results.  Original Report Authenticated By: Dellia Cloud, M.D.   Dg Abd Acute W/chest  07/02/2012  *RADIOLOGY REPORT*  Clinical Data: Cirrhosis, hepatitis C, CHF, nausea, vomiting  ACUTE ABDOMEN SERIES (ABDOMEN 2 VIEW & CHEST 1 VIEW)  Comparison: 12/05/2011  Findings: Normal heart  size and vascularity.  Clear lungs. Negative for pneumonia, CHF, effusion or pneumothorax.  Trachea midline.  No free air.  Scattered air and stool throughout the abdomen.  Negative for obstruction, ileus, dilatation, or free air. Increased density of the abdomen compatible with abdominal ascites. No acute osseous finding.  IMPRESSION: No acute chest process  Negative for  obstruction or dilatation  Suspect abdominal ascites  Original Report Authenticated By: Judie Petit. Ruel Favors, M.D.    Scheduled Meds:    . bumetanide  2 mg Oral Daily  . Chlorhexidine Gluconate Cloth  6 each Topical Q0600  . docusate sodium  100 mg Oral Daily  . feeding supplement  237 mL Oral BID BM  . multivitamin with minerals  1 tablet Oral Daily  . mupirocin ointment  1 application Nasal BID  . pantoprazole  40 mg Oral Q1200  . polyethylene glycol  17 g Oral Daily  . potassium chloride  40 mEq Oral Once  . psyllium  1 packet Oral BID  . saccharomyces boulardii  250 mg Oral BID  . sodium chloride  3 mL Intravenous Q12H  . spironolactone  100 mg Oral QAC breakfast  . vancomycin  1,000 mg Intravenous Q12H  . DISCONTD: piperacillin-tazobactam  3.375 g Intravenous Once   Continuous Infusions:   Principal Problem:  *Seroma, postoperative, possible abscess Active Problems:  Cirrhosis of liver with history of hepatitis C  Hypokalemia  Severe malnutrition  Ascites  Normocytic anemia  Hyponatremia  MRSA (methicillin resistant staph aureus) culture positive    Time spent: 25    Marlin Canary  [facility] Pager [phone]. 07/08/2012, 8:14 AM  LOS: 6 days

## 2012-07-08 NOTE — Progress Notes (Signed)
Nutrition Follow-up  Intervention: Encouraged continued excellent meal intake. Nutrition goals met, signing off.   Diet Order: Low sodium, fluid restriction  - Pt reports his appetite has returned and has been eating 95-100% of meals and drinking 1 Ensure Complete daily.   Meds: Scheduled Meds:   . bumetanide  2 mg Oral Daily  . Chlorhexidine Gluconate Cloth  6 each Topical Q0600  . docusate sodium  100 mg Oral Daily  . feeding supplement  237 mL Oral BID BM  . multivitamin with minerals  1 tablet Oral Daily  . mupirocin ointment  1 application Nasal BID  . pantoprazole  40 mg Oral Q1200  . polyethylene glycol  17 g Oral Daily  . potassium chloride  40 mEq Oral Once  . psyllium  1 packet Oral BID  . saccharomyces boulardii  250 mg Oral BID  . sodium chloride  3 mL Intravenous Q12H  . spironolactone  100 mg Oral QAC breakfast  . vancomycin  1,000 mg Intravenous Q12H  . DISCONTD: piperacillin-tazobactam  3.375 g Intravenous Once   Continuous Infusions:  PRN Meds:.sodium chloride, alum & mag hydroxide-simeth, diphenhydrAMINE, diphenhydrAMINE, HYDROmorphone (DILAUDID) injection, HYDROmorphone, magic mouthwash, naproxen, ondansetron, oxyCODONE, senna-docusate, sodium chloride  Labs:  CMP     Component Value Date/Time   NA 126* 07/08/2012 0330   K 3.2* 07/08/2012 0330   CL 90* 07/08/2012 0330   CO2 31 07/08/2012 0330   GLUCOSE 109* 07/08/2012 0330   BUN 11 07/08/2012 0330   CREATININE 0.85 07/08/2012 0330   CREATININE 0.84 07/20/2011 0000   CALCIUM 8.0* 07/08/2012 0330   PROT 7.1 07/03/2012 0440   ALBUMIN 1.7* 07/03/2012 0440   AST 24 07/03/2012 0440   ALT 11 07/03/2012 0440   ALKPHOS 62 07/03/2012 0440   BILITOT 0.9 07/03/2012 0440   GFRNONAA >90 07/08/2012 0330   GFRAA >90 07/08/2012 0330     Intake/Output Summary (Last 24 hours) at 07/08/12 1146 Last data filed at 07/08/12 1000  Gross per 24 hour  Intake   1518 ml  Output   3350 ml  Net  -1832 ml   Last BM -  07/04/12 Drain# 1 - 30ml total yesterday Drain# 2 - 20ml total yesterday  Weight Status: No new weights  Re-estimated needs: No changes. 2400-2600 calories 80-95g protein  Nutrition Dx: Inadequate oral intake - resolved   Goal: PO intake >75% at meals - met  Monitor: Intake   Dietitian# [phone]

## 2012-07-08 NOTE — Progress Notes (Signed)
  Subjective: No complaints Minimal discomfort  Objective: Vital signs in last 24 hours: Temp:  [98.1 F (36.7 C)-99 F (37.2 C)] 98.3 F (36.8 C) (07/26 0600) Pulse Rate:  [67-79] 75  (07/26 0600) Resp:  [18] 18  (07/26 0600) BP: (116-121)/(55-68) 118/68 mmHg (07/26 0600) SpO2:  [96 %-100 %] 96 % (07/26 0600) Last BM Date: 07/04/12  Intake/Output from previous day: 07/25 0701 - 07/26 0700 In: 1715 [P.O.:1440; IV Piggyback:200] Out: 3550 [Urine:3500; Drains:50] Intake/Output this shift: Total I/O In: 730 [P.O.:720; Other:10] Out: 1900 [Urine:1850; Drains:50]  Almost non tender abdomen Drains about the same, minimal output Erythema nearly resolved  Lab Results:  No results found for this basename: WBC:2,HGB:2,HCT:2,PLT:2 in the last 72 hours BMET  Basename 07/08/12 0330 07/06/12 0835  NA 126* --  K 3.2* --  CL 90* --  CO2 31 --  GLUCOSE 109* --  BUN 11 --  CREATININE 0.85 0.81  CALCIUM 8.0* --   PT/INR No results found for this basename: LABPROT:2,INR:2 in the last 72 hours ABG No results found for this basename: PHART:2,PCO2:2,PO2:2,HCO3:2 in the last 72 hours  Studies/Results: No results found.  Anti-infectives: Anti-infectives     Start     Dose/Rate Route Frequency Ordered Stop   07/06/12 2200   vancomycin (VANCOCIN) IVPB 1000 mg/200 mL premix        1,000 mg 200 mL/hr over 60 Minutes Intravenous Every 12 hours 07/06/12 1334     07/04/12 1000   vancomycin (VANCOCIN) 1,250 mg in sodium chloride 0.9 % 250 mL IVPB  Status:  Discontinued        1,250 mg 166.7 mL/hr over 90 Minutes Intravenous Every 12 hours 07/04/12 0914 07/06/12 1334   07/02/12 2200   piperacillin-tazobactam (ZOSYN) IVPB 3.375 g  Status:  Discontinued        3.375 g 12.5 mL/hr over 240 Minutes Intravenous 3 times per day 07/02/12 1416 07/07/12 0706   07/02/12 1800   vancomycin (VANCOCIN) IVPB 1000 mg/200 mL premix  Status:  Discontinued        1,000 mg 200 mL/hr over 60 Minutes  Intravenous Every 8 hours 07/02/12 1419 07/04/12 0728   07/02/12 1200   piperacillin-tazobactam (ZOSYN) IVPB 3.375 g  Status:  Discontinued        3.375 g 100 mL/hr over 30 Minutes Intravenous  Once 07/02/12 1150 07/07/12 1311   07/02/12 0945   cefTRIAXone (ROCEPHIN) 2 g in dextrose 5 % 50 mL IVPB        2 g 100 mL/hr over 30 Minutes Intravenous  Once 07/02/12 0939 07/02/12 1115   07/02/12 0930   vancomycin (VANCOCIN) injection 1 g  Status:  Discontinued        1 g Intravenous  Once 07/02/12 0926 07/02/12 0928   07/02/12 0930   vancomycin (VANCOCIN) IVPB 1000 mg/200 mL premix        1,000 mg 200 mL/hr over 60 Minutes Intravenous  Once 07/02/12 0929 07/02/12 1045          Assessment/Plan: s/p * No surgery found *  Continue IV antibiotics and drains through the weekend. Encourage good po intake  LOS: 6 days    Noah Medina A 07/08/2012

## 2012-07-09 MED ORDER — LACTULOSE 10 GM/15ML PO SOLN
20.0000 g | Freq: Every day | ORAL | Status: DC
  Administered 2012-07-09 – 2012-07-10 (×2): 20 g via ORAL
  Filled 2012-07-09 (×3): qty 30

## 2012-07-09 MED ORDER — POTASSIUM CHLORIDE CRYS ER 20 MEQ PO TBCR
40.0000 meq | EXTENDED_RELEASE_TABLET | Freq: Once | ORAL | Status: AC
  Administered 2012-07-09: 40 meq via ORAL
  Filled 2012-07-09: qty 2

## 2012-07-09 NOTE — Progress Notes (Signed)
Paged Dr. Benjamine Mola that patient sodium 126 and patient on low sodium diet and saline locked, awaiting any further orders.

## 2012-07-09 NOTE — Progress Notes (Signed)
ANTIBIOTIC CONSULT NOTE - FOLLOW UP  Pharmacy Consult for Vancomycin Indication: MRSA seroma / abdominal wall abcess  No Known Allergies  Patient Measurements: Height: 5\' 11"  (180.3 cm) Weight: 162 lb 12.8 oz (73.846 kg) IBW/kg (Calculated) : 75.3   Vital Signs: Temp: 98 F (36.7 C) (07/27 0508) Temp src: Oral (07/27 0508) BP: 111/56 mmHg (07/27 0508) Pulse Rate: 73  (07/27 0508) Intake/Output from previous day: 07/26 0701 - 07/27 0700 In: 483 [P.O.:480; I.V.:3] Out: 2260 [Urine:2100; Drains:160] Intake/Output from this shift: Total I/O In: -  Out: 325 [Urine:250; Drains:75]  Labs:  [facility] 07/08/12 0330  WBC --  HGB --  PLT --  LABCREA --  CREATININE 0.85   Estimated Creatinine Clearance: 101.3 ml/min (by C-G formula based on Cr of 0.85).  Microbiology: Recent Results (from the past 720 hour(s))  BODY FLUID CULTURE     Status: Normal   Collection Time   07/02/12 10:55 AM      Component Value Range Status Comment   Specimen Description PERITONEAL CAVITY   Final    Special Requests NONE   Final    Gram Stain     Final    Value: WBC PRESENT, PREDOMINANTLY PMN     GRAM POSITIVE COCCI IN PAIRS   Culture     Final    Value: ABUNDANT METHICILLIN RESISTANT STAPHYLOCOCCUS AUREUS     Note: RIFAMPIN AND GENTAMICIN SHOULD NOT BE USED AS SINGLE DRUGS FOR TREATMENT OF STAPH INFECTIONS. This organism DOES NOT demonstrate inducible Clindamycin resistance in vitro.   Report Status 07/04/2012 FINAL   Final    Organism ID, Bacteria METHICILLIN RESISTANT STAPHYLOCOCCUS AUREUS   Final   CULTURE, ROUTINE-ABSCESS     Status: Normal   Collection Time   07/02/12  3:21 PM      Component Value Range Status Comment   Specimen Description DRAINAGE   Final    Special Requests Normal   Final    Gram Stain     Final    Value: ABUNDANT WBC PRESENT, PREDOMINANTLY PMN     NO SQUAMOUS EPITHELIAL CELLS SEEN     FEW GRAM POSITIVE COCCI IN CLUSTERS     CRITICAL RESULT CALLED TO, READ BACK BY  AND VERIFIED WITH: CRITICAL RESULT CALLED TO, READ BACK BY AND VERIFIED WITH: PATTY CORBITT 6:45PM 07/02/12 BY RUSCA.   Culture     Final    Value: ABUNDANT METHICILLIN RESISTANT STAPHYLOCOCCUS AUREUS     Note: RIFAMPIN AND GENTAMICIN SHOULD NOT BE USED AS SINGLE DRUGS FOR TREATMENT OF STAPH INFECTIONS. This organism DOES NOT demonstrate inducible Clindamycin resistance in vitro. CRITICAL RESULT CALLED TO, READ BACK BY AND VERIFIED WITH: CLARISSA S BY      INGRAM A 7/23 845AM   Report Status 07/05/2012 FINAL   Final    Organism ID, Bacteria METHICILLIN RESISTANT STAPHYLOCOCCUS AUREUS   Final   ANAEROBIC CULTURE     Status: Normal   Collection Time   07/02/12  3:21 PM      Component Value Range Status Comment   Specimen Description OTHER ABSCESS DRAINAGE PERITONEAL   Final    Special Requests Normal   Final    Gram Stain     Final    Value: ABUNDANT WBC PRESENT, PREDOMINANTLY PMN     NO SQUAMOUS EPITHELIAL CELLS SEEN     FEW GRAM POSITIVE COCCI IN CLUSTERS   Culture NO ANAEROBES ISOLATED   Final    Report Status 07/07/2012 FINAL   Final  Assessment:  25 YOM with abdominal wall abscess/seroma following ventral hernia repair in April 2013, underwent drainage by IR on 07/02/12.  Both drains with decreasing output now.    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  Plans for mesh removal?  Renal function has been stable since last vancomycin dose adjustment.    Goal of Therapy:  Vancomycin trough level 15-20 mcg/ml  Plan:  Continue vancomycin 1g IV q12h. F/u LOT.  Clance Boll 07/09/2012,11:15 AM

## 2012-07-09 NOTE — Progress Notes (Signed)
[facility] PROGRESS NOTE  Noah Medina NWG:[id number] DOB: Mar 22, 1956 DOA: 07/02/2012 PCP: Iona Hansen, NP  Assessment/Plan: Principal Problem:  *Seroma, postoperative, possible abscess Active Problems:  Cirrhosis of liver with history of hepatitis C  Hypokalemia  Severe malnutrition  Ascites  Normocytic anemia  Hyponatremia  MRSA (methicillin resistant staph aureus) culture positive    Seroma, postoperative, possible abscess / MRSA abscess  Admitted by surgery. Percutaneous drain x 2 placed by IR 07/02/12.  Diagnostic paracentesis done by EDP. Abundant MRSA. Recommend d/c zosyn but will defer to surgeon.  ? mesh removal.  Hyponatremia  Secondary to cirrhosis physiology.  Na+ improved Fluid restrict   Normocytic anemia  Likely AOCD. Hemoglobin stable.   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 LFTs not elevated.  Continue diuretic Coags show slight impairment of synthetic function.   Hypokalemia  repleat.   Severe malnutrition  Dietician evaluation performed 07/03/12. Resource supplements ordered.  Constipation - added miralax to regimen -add lactulose today -encouraged patient to walk   -BMP on Sunday again   Code Status: full Family Communication: patient at bedside Disposition Plan: per surgery    HPI/Subjective: Small BM this AM Little bit of nausea today   Objective: Filed Vitals:   07/08/12 0600 07/08/12 1400 07/08/12 2131 07/09/12 0508  BP: 118/68 116/66 122/63 111/56  Pulse: 75 80 75 73  Temp: 98.3 F (36.8 C) 97.8 F (36.6 C) 98 F (36.7 C) 98 F (36.7 C)  TempSrc: Oral Oral Oral Oral  Resp: 18 18 16 18   Height:      Weight:      SpO2: 96% 100% 100% 100%    Intake/Output Summary (Last 24 hours) at 07/09/12 0842 Last data filed at 07/09/12 5784  Gross per 24 hour  Intake    483 ml  Output   2260 ml  Net  -1777 ml    Exam:  Gen: NAD  Cardiovascular: RRR, No M/R/G    Respiratory: Lungs CTAB  Gastrointestinal: +BS, Abdomen mildly distended. Erythema to the lower abdomen (decreasing per patient). 2 percutaneous drains remain in place.  Extremities: No C/E/C   Data Reviewed: Basic Metabolic Panel:  Lab 07/08/12 6962 07/06/12 0835 07/04/12 0405 07/03/12 0440  NA 126* -- -- 131*  K 3.2* -- -- 3.8  CL 90* -- -- 93*  CO2 31 -- -- 30  GLUCOSE 109* -- -- 120*  BUN 11 -- -- 18  CREATININE 0.85 0.81 1.07 1.14  CALCIUM 8.0* -- -- 8.4  MG -- -- -- --  PHOS -- -- -- --   Liver Function Tests:  Lab 07/03/12 0440  AST 24  ALT 11  ALKPHOS 62  BILITOT 0.9  PROT 7.1  ALBUMIN 1.7*   No results found for this basename: LIPASE:5,AMYLASE:5 in the last 168 hours No results found for this basename: AMMONIA:5 in the last 168 hours CBC:  Lab 07/03/12 0440  WBC 9.1  NEUTROABS --  HGB 10.0*  HCT 30.7*  MCV 87.2  PLT 211   Cardiac Enzymes: No results found for this basename: CKTOTAL:5,CKMB:5,CKMBINDEX:5,TROPONINI:5 in the last 168 hours BNP (last 3 results) No results found for this basename: PROBNP:3 in the last 8760 hours CBG: No results found for this basename: GLUCAP:5 in the last 168 hours  Recent Results (from the past 240 hour(s))  BODY FLUID CULTURE     Status: Normal   Collection Time   07/02/12 10:55 AM  Component Value Range Status Comment   Specimen Description PERITONEAL CAVITY   Final    Special Requests NONE   Final    Gram Stain     Final    Value: WBC PRESENT, PREDOMINANTLY PMN     GRAM POSITIVE COCCI IN PAIRS   Culture     Final    Value: ABUNDANT METHICILLIN RESISTANT STAPHYLOCOCCUS AUREUS     Note: RIFAMPIN AND GENTAMICIN SHOULD NOT BE USED AS SINGLE DRUGS FOR TREATMENT OF STAPH INFECTIONS. This organism DOES NOT demonstrate inducible Clindamycin resistance in vitro.   Report Status 07/04/2012 FINAL   Final    Organism ID, Bacteria METHICILLIN RESISTANT STAPHYLOCOCCUS AUREUS   Final   CULTURE, ROUTINE-ABSCESS     Status:  Normal   Collection Time   07/02/12  3:21 PM      Component Value Range Status Comment   Specimen Description DRAINAGE   Final    Special Requests Normal   Final    Gram Stain     Final    Value: ABUNDANT WBC PRESENT, PREDOMINANTLY PMN     NO SQUAMOUS EPITHELIAL CELLS SEEN     FEW GRAM POSITIVE COCCI IN CLUSTERS     CRITICAL RESULT CALLED TO, READ BACK BY AND VERIFIED WITH: CRITICAL RESULT CALLED TO, READ BACK BY AND VERIFIED WITH: PATTY CORBITT 6:45PM 07/02/12 BY RUSCA.   Culture     Final    Value: ABUNDANT METHICILLIN RESISTANT STAPHYLOCOCCUS AUREUS     Note: RIFAMPIN AND GENTAMICIN SHOULD NOT BE USED AS SINGLE DRUGS FOR TREATMENT OF STAPH INFECTIONS. This organism DOES NOT demonstrate inducible Clindamycin resistance in vitro. CRITICAL RESULT CALLED TO, READ BACK BY AND VERIFIED WITH: CLARISSA S BY      INGRAM A 7/23 845AM   Report Status 07/05/2012 FINAL   Final    Organism ID, Bacteria METHICILLIN RESISTANT STAPHYLOCOCCUS AUREUS   Final   ANAEROBIC CULTURE     Status: Normal   Collection Time   07/02/12  3:21 PM      Component Value Range Status Comment   Specimen Description OTHER ABSCESS DRAINAGE PERITONEAL   Final    Special Requests Normal   Final    Gram Stain     Final    Value: ABUNDANT WBC PRESENT, PREDOMINANTLY PMN     NO SQUAMOUS EPITHELIAL CELLS SEEN     FEW GRAM POSITIVE COCCI IN CLUSTERS   Culture NO ANAEROBES ISOLATED   Final    Report Status 07/07/2012 FINAL   Final      Studies: Ct Guided Abscess Drain  07/02/2012  *RADIOLOGY REPORT*  Clinical Data: Abdominal wall and intraperitoneal abscess, history of ventral hernia repair with mesh placement.  CT GUIDED INTRA-ABDOMINAL PERITONEAL ABSCESS DRAIN INSERTION CT GUIDED SUPERFICIAL ABDOMINAL WALL ABSCESS DRAIN INSERTION  Date:  07/02/2012 12:35:00  Radiologist:  M. Ruel Favors, M.D.  Medications:  4 mg Versed, 200 mcg Fentanyl  Guidance:  CT  Fluoroscopy time:  None.  Sedation time:  45 minutes  Contrast volume:  None.   Complications:  No immediate  PROCEDURE/FINDINGS:  Informed consent was obtained from the patient following explanation of the procedure, risks, benefits and alternatives. The patient understands, agrees and consents for the procedure. All questions were addressed.  A time out was performed.  Maximal barrier sterile technique utilized including caps, mask, sterile gowns, sterile gloves, large sterile drape, hand hygiene, and betadine  Previous imaging reviewed. The patient was positioned supine. Noncontrast localization CT performed.  The  larger intraperitoneal abscess collection deep to the abdominal mesh was first localized. Under sterile conditions and local anesthesia, a 17 gauge access needle was advanced from a right lower quadrant oblique approach into the deep larger abdominal abscess.  Needle position confirmed with CT.  Syringe aspiration yielded exudative fluid.  Samples sent for gram stain and culture.  Amplatz guide wire inserted followed by tract dilatation to advance a 14-French drain.  Drain catheter position confirmed with CT.  Vacutainer suction yielded 1000-50 ml of exudative fluid.  In a similar fashion, the superficial abdominal wall fluid collection was localized.  Under sterile conditions and local anesthesia, a 17 gauge access needle was advanced superficially into the second abscess collection.  Needle position confirmed with CT.  Syringe aspiration yielded a similar appearing exudative fluid.  Guide wire advanced followed by tract dilatation to insert a 12-French drain.  Syringe aspiration yielded 375 ml of fluid.  Drain catheter secured a Prolene sutures and connected to external gravity drainage.  IMPRESSION: Successful 14-French intra-abdominal abscess drain insertion deep to the abdominal mesh.  Successful 12-French superficial abdominal wall abscess drain insertion anterior to the abdominal mesh  Original Report Authenticated By: Judie Petit. Ruel Favors, M.D.   Ct Guided Abscess  Drain  07/02/2012  *RADIOLOGY REPORT*  Clinical Data: Abdominal wall and intraperitoneal abscess, history of ventral hernia repair with mesh placement.  CT GUIDED INTRA-ABDOMINAL PERITONEAL ABSCESS DRAIN INSERTION CT GUIDED SUPERFICIAL ABDOMINAL WALL ABSCESS DRAIN INSERTION  Date:  07/02/2012 12:35:00  Radiologist:  M. Ruel Favors, M.D.  Medications:  4 mg Versed, 200 mcg Fentanyl  Guidance:  CT  Fluoroscopy time:  None.  Sedation time:  45 minutes  Contrast volume:  None.  Complications:  No immediate  PROCEDURE/FINDINGS:  Informed consent was obtained from the patient following explanation of the procedure, risks, benefits and alternatives. The patient understands, agrees and consents for the procedure. All questions were addressed.  A time out was performed.  Maximal barrier sterile technique utilized including caps, mask, sterile gowns, sterile gloves, large sterile drape, hand hygiene, and betadine  Previous imaging reviewed. The patient was positioned supine. Noncontrast localization CT performed.  The larger intraperitoneal abscess collection deep to the abdominal mesh was first localized. Under sterile conditions and local anesthesia, a 17 gauge access needle was advanced from a right lower quadrant oblique approach into the deep larger abdominal abscess.  Needle position confirmed with CT.  Syringe aspiration yielded exudative fluid.  Samples sent for gram stain and culture.  Amplatz guide wire inserted followed by tract dilatation to advance a 14-French drain.  Drain catheter position confirmed with CT.  Vacutainer suction yielded 1000-50 ml of exudative fluid.  In a similar fashion, the superficial abdominal wall fluid collection was localized.  Under sterile conditions and local anesthesia, a 17 gauge access needle was advanced superficially into the second abscess collection.  Needle position confirmed with CT.  Syringe aspiration yielded a similar appearing exudative fluid.  Guide wire advanced  followed by tract dilatation to insert a 12-French drain.  Syringe aspiration yielded 375 ml of fluid.  Drain catheter secured a Prolene sutures and connected to external gravity drainage.  IMPRESSION: Successful 14-French intra-abdominal abscess drain insertion deep to the abdominal mesh.  Successful 12-French superficial abdominal wall abscess drain insertion anterior to the abdominal mesh  Original Report Authenticated By: Judie Petit. Ruel Favors, M.D.   Ct Abdomen Pelvis W Contrast  07/02/2012  *RADIOLOGY REPORT*  Clinical Data: Abdominal pain and nausea.  Vomiting.  Abdominal distention.  CT ABDOMEN AND PELVIS WITH CONTRAST  Technique:  Multidetector CT imaging of the abdomen and pelvis was performed following the standard protocol during bolus administration of intravenous contrast.  Contrast: OMNIPAQUE IOHEXOL 300 MG/ML  SOLN  Comparison: 07/02/2012 radiographs; ultrasound of 04/21/2012  Findings: Postoperative findings noted at the gastroesophageal junction.  Nodular contour the liver is compatible with cirrhosis.  A 21.3 x 15.8 x 21.5 cm pocket of fluid and gas is present anteriorly within the abdomen, with enhancing margins suspected for suspicious for a large abscess.  I am uncertain of the location of this collection with respect to the hernia mesh, although there is a curved sheet-like plane of gas density central within this collection and oriented coronal and which could represent gas along the hernia mesh; alternatively, the hernia mesh might be posterior to the collection.  However, the collection is thought to be deep to the abdominal wall musculature.  Scattered gas densities raise suspicion for multiloculated.  Additional small pockets of fluid are scattered in the abdomen but are not gas-filled.  Mild splenomegaly noted.  Intrahepatic portal vein branches appear attenuated.  Superior mesenteric vein is prominent and there are extensive varices below the right kidney  Abnormal wall thickening  and mild dilatation of the distal duodenum and proximal jejunum noted.  Irregular nodularity / lobularity of the colon is present, especially proximally, and colitis is not excluded.  Fluid infiltration and mild nodularity along the omentum noted; some of this is likely vascular.  Mild retroperitoneal adenopathy noted, with a left periaortic node measuring 1.3 cm in short axis on image 24 series 2.  This adenopathy may well be reactive.  Urinary bladder unremarkable.  Renal enhancement appears normal. Pancreas and adrenal glands unremarkable.  Possible left unilateral pars defect at L5 noted.  Aortoiliac atherosclerotic vascular disease is present.  IMPRESSION:  1. Very large (21 cm in diameter) collection of gas and fluid in the anterior abdomen, with a flat sheet of gas internally within this structure, suspicious for a large abscess along the hernia mesh. This may be multiloculated given the differing vertical levels of gas within the abscess. 2.  Cirrhotic liver disease with considerable varices. 3.  Mild splenomegaly. 4.  Wall thickening and mucosal enhancement in the colon, query colitis. 5.  Abnormal wall thickening and mild dilatation of the proximal small bowel, probably ileus secondary to the large abscess. 6.  Small pockets of fluid elsewhere in the abdomen are indeterminate with regard to whether they represent abscesses or simply ascites. 7.  Possible left unilateral pars defect at L5.  These results were called by telephone on 07/02/2012 at 9:13 a.m. to Dr. Derwood Kaplan, who verbally acknowledged these results.  Original Report Authenticated By: Dellia Cloud, M.D.   Dg Abd Acute W/chest  07/02/2012  *RADIOLOGY REPORT*  Clinical Data: Cirrhosis, hepatitis C, CHF, nausea, vomiting  ACUTE ABDOMEN SERIES (ABDOMEN 2 VIEW & CHEST 1 VIEW)  Comparison: 12/05/2011  Findings: Normal heart size and vascularity.  Clear lungs. Negative for pneumonia, CHF, effusion or pneumothorax.  Trachea midline.  No  free air.  Scattered air and stool throughout the abdomen.  Negative for obstruction, ileus, dilatation, or free air. Increased density of the abdomen compatible with abdominal ascites. No acute osseous finding.  IMPRESSION: No acute chest process  Negative for obstruction or dilatation  Suspect abdominal ascites  Original Report Authenticated By: Judie Petit. Ruel Favors, M.D.    Scheduled Meds:    . bumetanide  2 mg Oral Daily  .  docusate sodium  100 mg Oral Daily  . feeding supplement  237 mL Oral BID BM  . lactulose  20 g Oral Daily  . multivitamin with minerals  1 tablet Oral Daily  . mupirocin ointment  1 application Nasal BID  . pantoprazole  40 mg Oral Q1200  . polyethylene glycol  17 g Oral Daily  . potassium chloride  40 mEq Oral Once  . potassium chloride  40 mEq Oral Once  . psyllium  1 packet Oral BID  . saccharomyces boulardii  250 mg Oral BID  . sodium chloride  3 mL Intravenous Q12H  . spironolactone  100 mg Oral QAC breakfast  . vancomycin  1,000 mg Intravenous Q12H   Continuous Infusions:   Principal Problem:  *Seroma, postoperative, possible abscess Active Problems:  Cirrhosis of liver with history of hepatitis C  Hypokalemia  Severe malnutrition  Ascites  Normocytic anemia  Hyponatremia  MRSA (methicillin resistant staph aureus) culture positive    Time spent: 25    Marlin Canary  [facility] Pager [phone]. 07/09/2012, 8:42 AM  LOS: 7 days

## 2012-07-09 NOTE — Progress Notes (Signed)
Subjective: Pt ok except for some mild nausea this am.  Objective: Physical Exam: BP 111/56  Pulse 73  Temp 98 F (36.7 C) (Oral)  Resp 18  Ht 5\' 11"  (1.803 m)  Wt 162 lb 12.8 oz (73.846 kg)  BMI 22.71 kg/m2  SpO2 100% Drains intact, sites clean and dry. Drain #1-160cc yesterday, already 75cc today #2-40cc yesterday, but already 60cc today.   Labs: CBC No results found for this basename: WBC:2,HGB:2,HCT:2,PLT:2 in the last 72 hours BMET  Basename 07/08/12 0330  NA 126*  K 3.2*  CL 90*  CO2 31  GLUCOSE 109*  BUN 11  CREATININE 0.85  CALCIUM 8.0*   LFT No results found for this basename: PROT,ALBUMIN,AST,ALT,ALKPHOS,BILITOT,BILIDIR,IBILI,LIPASE in the last 72 hours PT/INR No results found for this basename: LABPROT:2,INR:2 in the last 72 hours   Studies/Results: No results found.  Assessment/Plan: s/p Abd abscess drain x 2 placed 7/20 , Drain #1 has been dominant but both drains with decreasing output. Cont to follow.    LOS: 7 days    Brayton El PA-C 07/09/2012 10:43 AM

## 2012-07-09 NOTE — Progress Notes (Signed)
Seroma, postoperative  Subjective: He has a little but of nausea today which he says is new. No abdominal pain or other changes.  Objective: Vital signs in last 24 hours: Temp:  [97.8 F (36.6 C)-98 F (36.7 C)] 98 F (36.7 C) (07/27 0508) Pulse Rate:  [73-80] 73  (07/27 0508) Resp:  [16-18] 18  (07/27 0508) BP: (111-122)/(56-66) 111/56 mmHg (07/27 0508) SpO2:  [100 %] 100 % (07/27 0508) Last BM Date: 07/04/12  Intake/Output from previous day: 07/26 0701 - 07/27 0700 In: 483 [P.O.:480; I.V.:3] Out: 2260 [Urine:2100; Drains:160] Intake/Output this shift:    General appearance: alert, cooperative, appears older than stated age and no distress GI: abdomen remains distended consistent with ascites. He is a little tender in the left upper quadrant but he says this been stable.  He does take his abdomen is any different than it was yesterday or the day before. His drains are intact and seem to be draining clear fluid.  Lab Results:  No results found for this or any previous visit (from the past 24 hour(s)).   Studies/Results Radiology     MEDS, Scheduled    . bumetanide  2 mg Oral Daily  . docusate sodium  100 mg Oral Daily  . feeding supplement  237 mL Oral BID BM  . multivitamin with minerals  1 tablet Oral Daily  . mupirocin ointment  1 application Nasal BID  . pantoprazole  40 mg Oral Q1200  . polyethylene glycol  17 g Oral Daily  . potassium chloride  40 mEq Oral Once  . psyllium  1 packet Oral BID  . saccharomyces boulardii  250 mg Oral BID  . sodium chloride  3 mL Intravenous Q12H  . spironolactone  100 mg Oral QAC breakfast  . vancomycin  1,000 mg Intravenous Q12H     Assessment: Seroma, postoperative Appear stable but new development of nausea./Serum sodium was 126 and his last potassium was 3.2  Plan: No change today.  LOS: 7 days    Currie Paris, MD, [facility], [place] [phone]   07/09/2012 8:35 AM

## 2012-07-09 NOTE — Progress Notes (Signed)
Dr. Benjamine Mola called states is aware of labs from yesterday and will recheck in am and see if any change

## 2012-07-09 NOTE — Progress Notes (Signed)
Patient c/o nausea, zofran drawn up but wasted after IV clotted off unable to flush, attempted new site x2 unsuccessful have paged IV team and will medicated when restarted

## 2012-07-10 DIAGNOSIS — E871 Hypo-osmolality and hyponatremia: Secondary | ICD-10-CM

## 2012-07-10 LAB — CBC
HCT: 26.8 % — ABNORMAL LOW (ref 39.0–52.0)
MCHC: 33.6 g/dL (ref 30.0–36.0)
Platelets: 144 10*3/uL — ABNORMAL LOW (ref 150–400)
RDW: 16.4 % — ABNORMAL HIGH (ref 11.5–15.5)

## 2012-07-10 LAB — BASIC METABOLIC PANEL
BUN: 12 mg/dL (ref 6–23)
GFR calc Af Amer: >90 mL/min (ref >90)
GFR calc non Af Amer: >90 mL/min (ref >90)
Potassium: 3.6 mEq/L (ref 3.5–5.1)
Sodium: 124 mEq/L — ABNORMAL LOW (ref 135–145)

## 2012-07-10 MED ORDER — SODIUM CHLORIDE 0.9 % IV SOLN
INTRAVENOUS | Status: DC
  Administered 2012-07-10 – 2012-07-11 (×3): via INTRAVENOUS

## 2012-07-10 MED ORDER — POTASSIUM CHLORIDE CRYS ER 20 MEQ PO TBCR
40.0000 meq | EXTENDED_RELEASE_TABLET | Freq: Every day | ORAL | Status: DC
  Administered 2012-07-10: 40 meq via ORAL
  Filled 2012-07-10 (×2): qty 2

## 2012-07-10 NOTE — Progress Notes (Signed)
Spoke to [facility] DO on floor informed her patient states he had several bms last night and refused metamucil and miralax only wanted lactulose.

## 2012-07-10 NOTE — Progress Notes (Signed)
[facility] PROGRESS NOTE  Noah Medina ZOX:[id number] DOB: [birth year] DOA: 07/02/2012 PCP: Iona Hansen, NP  Assessment/Plan: Principal Problem:  *Seroma, postoperative, possible abscess Active Problems:  Cirrhosis of liver with history of hepatitis C  Hypokalemia  Severe malnutrition  Ascites  Normocytic anemia  Hyponatremia  MRSA (methicillin resistant staph aureus) culture positive    Seroma, postoperative, possible abscess / MRSA abscess  Admitted by surgery. Percutaneous drain x 2 placed by IR 07/02/12.  Diagnostic paracentesis done by EDP. Abundant MRSA.  ? mesh removal.  Hyponatremia  Secondary to cirrhosis physiology.  Fluid restrict- free water and give saline   Normocytic anemia  Likely AOCD. Hemoglobin stable.   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 LFTs not elevated.  Continue diuretic Coags show slight impairment of synthetic function.   Hypokalemia  repleat.   Severe malnutrition  Dietician evaluation performed 07/03/12. Resource supplements ordered.  Constipation - added miralax to regimen -add lactulose today -encouraged patient to walk   Labs in AM   Code Status: full Family Communication: patient at bedside Disposition Plan: per surgery    HPI/Subjective: Bowels moved last night No CP No SOB   Objective: Filed Vitals:   07/09/12 1423 07/09/12 1935 07/10/12 0520 07/10/12 1022  BP: 133/77 107/63 111/63 111/65  Pulse: 76 78 71 77  Temp: 97.7 F (36.5 C) 98.1 F (36.7 C) 98.6 F (37 C)   TempSrc: Oral Oral Oral   Resp: 16 18 18    Height:      Weight:      SpO2: 100% 98% 97%     Intake/Output Summary (Last 24 hours) at 07/10/12 1032 Last data filed at 07/10/12 1024  Gross per 24 hour  Intake    936 ml  Output   1780 ml  Net   -844 ml    Exam:  Gen: NAD  Cardiovascular: RRR, No M/R/G  Respiratory: Lungs CTAB  Gastrointestinal: +BS, Abdomen mildly distended. Erythema  to the lower abdomen (decreasing per patient). 2 percutaneous drains remain in place.  Extremities: No C/E/C   Data Reviewed: Basic Metabolic Panel:  Lab 07/10/12 8119 07/08/12 0330 07/06/12 0835 07/04/12 0405  NA 124* 126* -- --  K 3.6 3.2* -- --  CL 87* 90* -- --  CO2 30 31 -- --  GLUCOSE 103* 109* -- --  BUN 12 11 -- --  CREATININE 0.79 0.85 0.81 1.07  CALCIUM 8.5 8.0* -- --  MG -- -- -- --  PHOS -- -- -- --   Liver Function Tests: No results found for this basename: AST:5,ALT:5,ALKPHOS:5,BILITOT:5,PROT:5,ALBUMIN:5 in the last 168 hours No results found for this basename: LIPASE:5,AMYLASE:5 in the last 168 hours No results found for this basename: AMMONIA:5 in the last 168 hours CBC:  Lab 07/10/12 0408  WBC 6.3  NEUTROABS --  HGB 9.0*  HCT 26.8*  MCV 85.4  PLT 144*   Cardiac Enzymes: No results found for this basename: CKTOTAL:5,CKMB:5,CKMBINDEX:5,TROPONINI:5 in the last 168 hours BNP (last 3 results) No results found for this basename: PROBNP:3 in the last 8760 hours CBG: No results found for this basename: GLUCAP:5 in the last 168 hours  Recent Results (from the past 240 hour(s))  BODY FLUID CULTURE     Status: Normal   Collection Time   07/02/12 10:55 AM      Component Value Range Status Comment   Specimen Description PERITONEAL CAVITY   Final    Special Requests NONE  Final    Gram Stain     Final    Value: WBC PRESENT, PREDOMINANTLY PMN     GRAM POSITIVE COCCI IN PAIRS   Culture     Final    Value: ABUNDANT METHICILLIN RESISTANT STAPHYLOCOCCUS AUREUS     Note: RIFAMPIN AND GENTAMICIN SHOULD NOT BE USED AS SINGLE DRUGS FOR TREATMENT OF STAPH INFECTIONS. This organism DOES NOT demonstrate inducible Clindamycin resistance in vitro.   Report Status 07/04/2012 FINAL   Final    Organism ID, Bacteria METHICILLIN RESISTANT STAPHYLOCOCCUS AUREUS   Final   CULTURE, ROUTINE-ABSCESS     Status: Normal   Collection Time   07/02/12  3:21 PM      Component Value  Range Status Comment   Specimen Description DRAINAGE   Final    Special Requests Normal   Final    Gram Stain     Final    Value: ABUNDANT WBC PRESENT, PREDOMINANTLY PMN     NO SQUAMOUS EPITHELIAL CELLS SEEN     FEW GRAM POSITIVE COCCI IN CLUSTERS     CRITICAL RESULT CALLED TO, READ BACK BY AND VERIFIED WITH: CRITICAL RESULT CALLED TO, READ BACK BY AND VERIFIED WITH: PATTY CORBITT 6:45PM 07/02/12 BY RUSCA.   Culture     Final    Value: ABUNDANT METHICILLIN RESISTANT STAPHYLOCOCCUS AUREUS     Note: RIFAMPIN AND GENTAMICIN SHOULD NOT BE USED AS SINGLE DRUGS FOR TREATMENT OF STAPH INFECTIONS. This organism DOES NOT demonstrate inducible Clindamycin resistance in vitro. CRITICAL RESULT CALLED TO, READ BACK BY AND VERIFIED WITH: CLARISSA S BY      INGRAM A 7/23 845AM   Report Status 07/05/2012 FINAL   Final    Organism ID, Bacteria METHICILLIN RESISTANT STAPHYLOCOCCUS AUREUS   Final   ANAEROBIC CULTURE     Status: Normal   Collection Time   07/02/12  3:21 PM      Component Value Range Status Comment   Specimen Description OTHER ABSCESS DRAINAGE PERITONEAL   Final    Special Requests Normal   Final    Gram Stain     Final    Value: ABUNDANT WBC PRESENT, PREDOMINANTLY PMN     NO SQUAMOUS EPITHELIAL CELLS SEEN     FEW GRAM POSITIVE COCCI IN CLUSTERS   Culture NO ANAEROBES ISOLATED   Final    Report Status 07/07/2012 FINAL   Final      Studies: Ct Guided Abscess Drain  07/02/2012  *RADIOLOGY REPORT*  Clinical Data: Abdominal wall and intraperitoneal abscess, history of ventral hernia repair with mesh placement.  CT GUIDED INTRA-ABDOMINAL PERITONEAL ABSCESS DRAIN INSERTION CT GUIDED SUPERFICIAL ABDOMINAL WALL ABSCESS DRAIN INSERTION  Date:  07/02/2012 12:35:00  Radiologist:  M. Ruel Favors, M.D.  Medications:  4 mg Versed, 200 mcg Fentanyl  Guidance:  CT  Fluoroscopy time:  None.  Sedation time:  45 minutes  Contrast volume:  None.  Complications:  No immediate  PROCEDURE/FINDINGS:  Informed  consent was obtained from the patient following explanation of the procedure, risks, benefits and alternatives. The patient understands, agrees and consents for the procedure. All questions were addressed.  A time out was performed.  Maximal barrier sterile technique utilized including caps, mask, sterile gowns, sterile gloves, large sterile drape, hand hygiene, and betadine  Previous imaging reviewed. The patient was positioned supine. Noncontrast localization CT performed.  The larger intraperitoneal abscess collection deep to the abdominal mesh was first localized. Under sterile conditions and local anesthesia, a 17 gauge access  needle was advanced from a right lower quadrant oblique approach into the deep larger abdominal abscess.  Needle position confirmed with CT.  Syringe aspiration yielded exudative fluid.  Samples sent for gram stain and culture.  Amplatz guide wire inserted followed by tract dilatation to advance a 14-French drain.  Drain catheter position confirmed with CT.  Vacutainer suction yielded 1000-50 ml of exudative fluid.  In a similar fashion, the superficial abdominal wall fluid collection was localized.  Under sterile conditions and local anesthesia, a 17 gauge access needle was advanced superficially into the second abscess collection.  Needle position confirmed with CT.  Syringe aspiration yielded a similar appearing exudative fluid.  Guide wire advanced followed by tract dilatation to insert a 12-French drain.  Syringe aspiration yielded 375 ml of fluid.  Drain catheter secured a Prolene sutures and connected to external gravity drainage.  IMPRESSION: Successful 14-French intra-abdominal abscess drain insertion deep to the abdominal mesh.  Successful 12-French superficial abdominal wall abscess drain insertion anterior to the abdominal mesh  Original Report Authenticated By: Judie Petit. Ruel Favors, M.D.   Ct Guided Abscess Drain  07/02/2012  *RADIOLOGY REPORT*  Clinical Data: Abdominal wall  and intraperitoneal abscess, history of ventral hernia repair with mesh placement.  CT GUIDED INTRA-ABDOMINAL PERITONEAL ABSCESS DRAIN INSERTION CT GUIDED SUPERFICIAL ABDOMINAL WALL ABSCESS DRAIN INSERTION  Date:  07/02/2012 12:35:00  Radiologist:  M. Ruel Favors, M.D.  Medications:  4 mg Versed, 200 mcg Fentanyl  Guidance:  CT  Fluoroscopy time:  None.  Sedation time:  45 minutes  Contrast volume:  None.  Complications:  No immediate  PROCEDURE/FINDINGS:  Informed consent was obtained from the patient following explanation of the procedure, risks, benefits and alternatives. The patient understands, agrees and consents for the procedure. All questions were addressed.  A time out was performed.  Maximal barrier sterile technique utilized including caps, mask, sterile gowns, sterile gloves, large sterile drape, hand hygiene, and betadine  Previous imaging reviewed. The patient was positioned supine. Noncontrast localization CT performed.  The larger intraperitoneal abscess collection deep to the abdominal mesh was first localized. Under sterile conditions and local anesthesia, a 17 gauge access needle was advanced from a right lower quadrant oblique approach into the deep larger abdominal abscess.  Needle position confirmed with CT.  Syringe aspiration yielded exudative fluid.  Samples sent for gram stain and culture.  Amplatz guide wire inserted followed by tract dilatation to advance a 14-French drain.  Drain catheter position confirmed with CT.  Vacutainer suction yielded 1000-50 ml of exudative fluid.  In a similar fashion, the superficial abdominal wall fluid collection was localized.  Under sterile conditions and local anesthesia, a 17 gauge access needle was advanced superficially into the second abscess collection.  Needle position confirmed with CT.  Syringe aspiration yielded a similar appearing exudative fluid.  Guide wire advanced followed by tract dilatation to insert a 12-French drain.  Syringe  aspiration yielded 375 ml of fluid.  Drain catheter secured a Prolene sutures and connected to external gravity drainage.  IMPRESSION: Successful 14-French intra-abdominal abscess drain insertion deep to the abdominal mesh.  Successful 12-French superficial abdominal wall abscess drain insertion anterior to the abdominal mesh  Original Report Authenticated By: Judie Petit. Ruel Favors, M.D.   Ct Abdomen Pelvis W Contrast  07/02/2012  *RADIOLOGY REPORT*  Clinical Data: Abdominal pain and nausea.  Vomiting.  Abdominal distention.  CT ABDOMEN AND PELVIS WITH CONTRAST  Technique:  Multidetector CT imaging of the abdomen and pelvis was performed following the  standard protocol during bolus administration of intravenous contrast.  Contrast: OMNIPAQUE IOHEXOL 300 MG/ML  SOLN  Comparison: 07/02/2012 radiographs; ultrasound of 04/21/2012  Findings: Postoperative findings noted at the gastroesophageal junction.  Nodular contour the liver is compatible with cirrhosis.  A 21.3 x 15.8 x 21.5 cm pocket of fluid and gas is present anteriorly within the abdomen, with enhancing margins suspected for suspicious for a large abscess.  I am uncertain of the location of this collection with respect to the hernia mesh, although there is a curved sheet-like plane of gas density central within this collection and oriented coronal and which could represent gas along the hernia mesh; alternatively, the hernia mesh might be posterior to the collection.  However, the collection is thought to be deep to the abdominal wall musculature.  Scattered gas densities raise suspicion for multiloculated.  Additional small pockets of fluid are scattered in the abdomen but are not gas-filled.  Mild splenomegaly noted.  Intrahepatic portal vein branches appear attenuated.  Superior mesenteric vein is prominent and there are extensive varices below the right kidney  Abnormal wall thickening and mild dilatation of the distal duodenum and proximal jejunum  noted.  Irregular nodularity / lobularity of the colon is present, especially proximally, and colitis is not excluded.  Fluid infiltration and mild nodularity along the omentum noted; some of this is likely vascular.  Mild retroperitoneal adenopathy noted, with a left periaortic node measuring 1.3 cm in short axis on image 24 series 2.  This adenopathy may well be reactive.  Urinary bladder unremarkable.  Renal enhancement appears normal. Pancreas and adrenal glands unremarkable.  Possible left unilateral pars defect at L5 noted.  Aortoiliac atherosclerotic vascular disease is present.  IMPRESSION:  1. Very large (21 cm in diameter) collection of gas and fluid in the anterior abdomen, with a flat sheet of gas internally within this structure, suspicious for a large abscess along the hernia mesh. This may be multiloculated given the differing vertical levels of gas within the abscess. 2.  Cirrhotic liver disease with considerable varices. 3.  Mild splenomegaly. 4.  Wall thickening and mucosal enhancement in the colon, query colitis. 5.  Abnormal wall thickening and mild dilatation of the proximal small bowel, probably ileus secondary to the large abscess. 6.  Small pockets of fluid elsewhere in the abdomen are indeterminate with regard to whether they represent abscesses or simply ascites. 7.  Possible left unilateral pars defect at L5.  These results were called by telephone on 07/02/2012 at 9:13 a.m. to Dr. Derwood Kaplan, who verbally acknowledged these results.  Original Report Authenticated By: Dellia Cloud, M.D.   Dg Abd Acute W/chest  07/02/2012  *RADIOLOGY REPORT*  Clinical Data: Cirrhosis, hepatitis C, CHF, nausea, vomiting  ACUTE ABDOMEN SERIES (ABDOMEN 2 VIEW & CHEST 1 VIEW)  Comparison: 12/05/2011  Findings: Normal heart size and vascularity.  Clear lungs. Negative for pneumonia, CHF, effusion or pneumothorax.  Trachea midline.  No free air.  Scattered air and stool throughout the abdomen.   Negative for obstruction, ileus, dilatation, or free air. Increased density of the abdomen compatible with abdominal ascites. No acute osseous finding.  IMPRESSION: No acute chest process  Negative for obstruction or dilatation  Suspect abdominal ascites  Original Report Authenticated By: Judie Petit. Ruel Favors, M.D.    Scheduled Meds:    . bumetanide  2 mg Oral Daily  . docusate sodium  100 mg Oral Daily  . feeding supplement  237 mL Oral BID BM  .  lactulose  20 g Oral Daily  . multivitamin with minerals  1 tablet Oral Daily  . pantoprazole  40 mg Oral Q1200  . polyethylene glycol  17 g Oral Daily  . potassium chloride  40 mEq Oral Once  . potassium chloride  40 mEq Oral Daily  . psyllium  1 packet Oral BID  . saccharomyces boulardii  250 mg Oral BID  . sodium chloride  3 mL Intravenous Q12H  . spironolactone  100 mg Oral QAC breakfast  . vancomycin  1,000 mg Intravenous Q12H   Continuous Infusions:    . sodium chloride 125 mL/hr at 07/10/12 0847    Principal Problem:  *Seroma, postoperative, possible abscess Active Problems:  Cirrhosis of liver with history of hepatitis C  Hypokalemia  Severe malnutrition  Ascites  Normocytic anemia  Hyponatremia  MRSA (methicillin resistant staph aureus) culture positive    Time spent: 25    Marlin Canary  [facility] Pager [phone]. 07/10/2012, 10:32 AM  LOS: 8 days

## 2012-07-10 NOTE — Progress Notes (Signed)
Patient c/o some cramps to hands, states feel better now but given kpad for abd and hands.

## 2012-07-10 NOTE — Progress Notes (Signed)
Seroma, postoperative  Subjective: His nausea from yesterday has resolved and he ate most of his breakfast this morning. He is not had any other change in his status.  Objective: Vital signs in last 24 hours: Temp:  [97.7 F (36.5 C)-98.6 F (37 C)] 98.6 F (37 C) (07/28 0520) Pulse Rate:  [71-78] 71  (07/28 0520) Resp:  [16-18] 18  (07/28 0520) BP: (107-133)/(63-77) 111/63 mmHg (07/28 0520) SpO2:  [97 %-100 %] 97 % (07/28 0520) Last BM Date: 07/09/12  Intake/Output from previous day: 07/27 0701 - 07/28 0700 In: 933 [P.O.:700; I.V.:3; IV Piggyback:200] Out: 2105 [Urine:1900; Drains:205] Intake/Output this shift:    General appearance: alert, cooperative, appears older than stated age and no distress GI: Mild tender, no change from yesterday   Lab Results:  Results for orders placed during the hospital encounter of 07/02/12 (from the past 24 hour(s))  CBC     Status: Abnormal   Collection Time   07/10/12  4:08 AM      Component Value Range   WBC 6.3  4.0 - 10.5 K/uL   RBC 3.14 (*) 4.22 - 5.81 MIL/uL   Hemoglobin 9.0 (*) 13.0 - 17.0 g/dL   HCT 16.1 (*) 09.6 - 04.5 %   MCV 85.4  78.0 - 100.0 fL   MCH 28.7  26.0 - 34.0 pg   MCHC 33.6  30.0 - 36.0 g/dL   RDW 40.9 (*) 81.1 - 91.4 %   Platelets 144 (*) 150 - 400 K/uL  BASIC METABOLIC PANEL     Status: Abnormal   Collection Time   07/10/12  4:08 AM      Component Value Range   Sodium 124 (*) 135 - 145 mEq/L   Potassium 3.6  3.5 - 5.1 mEq/L   Chloride 87 (*) 96 - 112 mEq/L   CO2 30  19 - 32 mEq/L   Glucose, Bld 103 (*) 70 - 99 mg/dL   BUN 12  6 - 23 mg/dL   Creatinine, Ser 7.82  0.50 - 1.35 mg/dL   Calcium 8.5  8.4 - 95.6 mg/dL   GFR calc non Af Amer >90  >90 mL/min   GFR calc Af Amer >90  >90 mL/min     Studies/Results Radiology     MEDS, Scheduled    . bumetanide  2 mg Oral Daily  . docusate sodium  100 mg Oral Daily  . feeding supplement  237 mL Oral BID BM  . lactulose  20 g Oral Daily  . multivitamin  with minerals  1 tablet Oral Daily  . mupirocin ointment  1 application Nasal BID  . pantoprazole  40 mg Oral Q1200  . polyethylene glycol  17 g Oral Daily  . potassium chloride  40 mEq Oral Once  . psyllium  1 packet Oral BID  . saccharomyces boulardii  250 mg Oral BID  . sodium chloride  3 mL Intravenous Q12H  . spironolactone  100 mg Oral QAC breakfast  . vancomycin  1,000 mg Intravenous Q12H     Assessment: Seroma, postoperative Stable  Plan: No Change today. Nausea has resolved and eating OK this am.   LOS: 8 days    Currie Paris, MD, [facility], [place] [phone]   07/10/2012 8:17 AM

## 2012-07-11 LAB — BASIC METABOLIC PANEL
Chloride: 90 mEq/L — ABNORMAL LOW (ref 96–112)
Creatinine, Ser: 0.84 mg/dL (ref 0.50–1.35)
GFR calc Af Amer: >90 mL/min (ref >90)
Sodium: 126 mEq/L — ABNORMAL LOW (ref 135–145)

## 2012-07-11 MED ORDER — HYDROMORPHONE HCL 4 MG PO TABS: 4.0000 mg | ORAL_TABLET | ORAL | Status: DC | PRN

## 2012-07-11 MED ORDER — LACTULOSE 10 GM/15ML PO SOLN: 20.0000 g | Freq: Every day | ORAL | Status: AC

## 2012-07-11 MED ORDER — DOXYCYCLINE HYCLATE 100 MG PO TABS: 100.0000 mg | ORAL_TABLET | Freq: Two times a day (BID) | ORAL | Status: DC

## 2012-07-11 NOTE — Progress Notes (Signed)
  Subjective: Reports minimal pain  Objective: Vital signs in last 24 hours: Temp:  [97.9 F (36.6 C)-98.7 F (37.1 C)] 97.9 F (36.6 C) (07/29 0552) Pulse Rate:  [65-83] 78  (07/29 0552) Resp:  [18] 18  (07/29 0552) BP: (111-127)/(65-72) 115/65 mmHg (07/29 0552) SpO2:  [96 %-100 %] 99 % (07/29 0552) Last BM Date: 07/10/12  Intake/Output from previous day: 07/28 0701 - 07/29 0700 In: 3021.3 [P.O.:1080; I.V.:1711.3; IV Piggyback:200] Out: 880 [Urine:750; Drains:130] Intake/Output this shift:    Drains still with moderate output Abdomen almost non tender and erythema almost gone  Lab Results:   Basename 07/10/12 0408  WBC 6.3  HGB 9.0*  HCT 26.8*  PLT 144*   BMET  Basename 07/10/12 0408  NA 124*  K 3.6  CL 87*  CO2 30  GLUCOSE 103*  BUN 12  CREATININE 0.79  CALCIUM 8.5   PT/INR No results found for this basename: LABPROT:2,INR:2 in the last 72 hours ABG No results found for this basename: PHART:2,PCO2:2,PO2:2,HCO3:2 in the last 72 hours  Studies/Results: No results found.  Anti-infectives: Anti-infectives     Start     Dose/Rate Route Frequency Ordered Stop   07/06/12 2200   vancomycin (VANCOCIN) IVPB 1000 mg/200 mL premix        1,000 mg 200 mL/hr over 60 Minutes Intravenous Every 12 hours 07/06/12 1334     07/04/12 1000   vancomycin (VANCOCIN) 1,250 mg in sodium chloride 0.9 % 250 mL IVPB  Status:  Discontinued        1,250 mg 166.7 mL/hr over 90 Minutes Intravenous Every 12 hours 07/04/12 0914 07/06/12 1334   07/02/12 2200   piperacillin-tazobactam (ZOSYN) IVPB 3.375 g  Status:  Discontinued        3.375 g 12.5 mL/hr over 240 Minutes Intravenous 3 times per day 07/02/12 1416 07/07/12 0706   07/02/12 1800   vancomycin (VANCOCIN) IVPB 1000 mg/200 mL premix  Status:  Discontinued        1,000 mg 200 mL/hr over 60 Minutes Intravenous Every 8 hours 07/02/12 1419 07/04/12 0728   07/02/12 1200   piperacillin-tazobactam (ZOSYN) IVPB 3.375 g  Status:   Discontinued        3.375 g 100 mL/hr over 30 Minutes Intravenous  Once 07/02/12 1150 07/07/12 1311   07/02/12 0945   cefTRIAXone (ROCEPHIN) 2 g in dextrose 5 % 50 mL IVPB        2 g 100 mL/hr over 30 Minutes Intravenous  Once 07/02/12 0939 07/02/12 1115   07/02/12 0930   vancomycin (VANCOCIN) injection 1 g  Status:  Discontinued        1 g Intravenous  Once 07/02/12 0926 07/02/12 0928   07/02/12 0930   vancomycin (VANCOCIN) IVPB 1000 mg/200 mL premix        1,000 mg 200 mL/hr over 60 Minutes Intravenous  Once 07/02/12 0929 07/02/12 1045          Assessment/Plan: s/p * No surgery found *  Infected seroma  He wants to go home which I feel is reasonable.  Will discharge on oral antibiotics and with drains in place.  I will see him in the office later this week and tentatively plan mesh removal for the following week as I suspect It can not be saved  LOS: 9 days    Kaysi Ourada A 07/11/2012

## 2012-07-11 NOTE — Discharge Summary (Signed)
Physician Discharge Summary  Patient ID: Noah Medina MRN: [id number] DOB/AGE: 57-Jul-1957 57 y.o.  Admit date: 07/02/2012 Discharge date: 07/11/2012  Admission Diagnoses: infected seroma       Discharge Diagnoses:  Principal Problem:  *Seroma, postoperative, possible abscess Active Problems:  Cirrhosis of liver with history of hepatitis C  Hypokalemia  Severe malnutrition  Ascites  Normocytic anemia  Hyponatremia  MRSA (methicillin resistant staph aureus) culture positive   Discharged Condition: fair  Hospital Course: admitted with infected seroma s/p lap ventral hernia repair with mesh several months ago.  Has significant history of cirrhosis.  Drains placed by IR.  Found to have MRSA.  Kept on IV antibiotics for one week.  Slowly improved.  Discharge home with drains in place for planned surgery at a later date  Consults: Internal Medicine/ [facility]  Significant Diagnostic Studies:   Treatments: antibiotics: vancomycin  Discharge Exam: Blood pressure 115/65, pulse 78, temperature 97.9 F (36.6 C), temperature source Oral, resp. rate 18, height 5\' 11"  (1.803 m), weight 162 lb 12.8 oz (73.846 kg), SpO2 99.00%. General appearance: alert and mild distress GI: soft, non-tender; bowel sounds normal; no masses,  no organomegaly Incision/Wound: mild abdominal wall erythema, serosang drain output  Disposition: 01-Home or Self Care  Discharge Orders    Future Appointments: Provider: Department: Dept Phone: Center:   07/13/2012 1:30 PM Shelly Rubenstein, MD [facility] [phone] None     Medication List  As of 07/11/2012  7:54 AM   TAKE these medications         bumetanide 2 MG tablet   Commonly known as: BUMEX   Take 2 mg by mouth daily.      doxycycline 100 MG tablet   Commonly known as: VIBRA-TABS   Take 1 tablet (100 mg total) by mouth 2 (two) times daily.      HYDROmorphone 4 MG tablet   Commonly known as: DILAUDID   Take 1 tablet (4 mg total)  by mouth every 4 (four) hours as needed.      HYDROmorphone 4 MG tablet   Commonly known as: DILAUDID   Take 4 mg by mouth every 4 (four) hours as needed. pain      lactulose 10 GM/15ML solution   Commonly known as: CHRONULAC   Take 30 mLs (20 g total) by mouth daily.      multivitamin with minerals Tabs   Take 1 tablet by mouth daily.      naproxen sodium 220 MG tablet   Commonly known as: ANAPROX   Take 440 mg by mouth 2 (two) times daily as needed. Pain      omeprazole 20 MG capsule   Commonly known as: PRILOSEC   Take 20 mg by mouth daily.      spironolactone 100 MG tablet   Commonly known as: ALDACTONE   Take 100 mg by mouth daily before breakfast.           Follow-up Information    Follow up with Abigail Miyamoto A, MD. Call on 07/15/2012. (call and ask for Pattricia Boss  [phone])    Contact information:   [facility] [address], [address] [place] [address] [phone]          Signed: Shelly Rubenstein 07/11/2012, 7:54 AM

## 2012-07-13 ENCOUNTER — Encounter (INDEPENDENT_AMBULATORY_CARE_PROVIDER_SITE_OTHER): Payer: Self-pay | Admitting: Surgery

## 2012-07-13 ENCOUNTER — Encounter (HOSPITAL_COMMUNITY): Payer: Self-pay | Admitting: Pharmacy Technician

## 2012-07-13 ENCOUNTER — Ambulatory Visit (INDEPENDENT_AMBULATORY_CARE_PROVIDER_SITE_OTHER): Payer: Medicaid Other | Admitting: Surgery

## 2012-07-13 VITALS — BP 125/80 | HR 88 | Temp 98.6°F | Resp 18 | Ht 71.0 in | Wt 172.6 lb

## 2012-07-13 DIAGNOSIS — IMO0002 Reserved for concepts with insufficient information to code with codable children: Secondary | ICD-10-CM | POA: Insufficient documentation

## 2012-07-13 NOTE — Progress Notes (Signed)
Subjective:     Patient ID: Noah Medina, male   DOB: 1956/02/03, 56 y.o.   MRN: [id number]  HPI  He is here for a followup visit after hospitalization for his infected seroma. He still has 2 drains that were put in place by interventional radiology. He has mild nausea and pain. He denies any fevers. He reports there is still moderate drainage coming out of the drains Review of Systems     Objective:   Physical Exam On exam, his erythema of the abdominal wall has resolved. The drains remaining seropurulent    Assessment:     Infected seroma    Plan:     Unfortunately, I do not believe this will resolve with out removal of the mesh. I will schedule him for mesh removal with primary repair of the hernia and placement of biologic mesh

## 2012-07-14 NOTE — Pre-Procedure Instructions (Signed)
20 Noah Medina  07/14/2012   Your procedure is scheduled on:  Wednesday July 20, 2012  Report to [facility] at 6:30 AM.  Call this number if you have problems the morning of surgery: [phone]   Remember:   Do not eat food or drink:After Midnight.      Take these medicines the morning of surgery with A SIP OF WATER: doxocycline, dilaudid, omeprazole   Do not wear jewelry, make-up or nail polish.  Do not wear lotions, powders, or perfumes. You may wear deodorant.  Do not shave 48 hours prior to surgery. Men may shave face and neck.  Do not bring valuables to the hospital.  Contacts, dentures or bridgework may not be worn into surgery.  Leave suitcase in the car. After surgery it may be brought to your room.  For patients admitted to the hospital, checkout time is 11:00 AM the day of discharge.   Patients discharged the day of surgery will not be allowed to drive home.  Name and phone number of your driver: Michela Pitcher [phone]  Special Instructions: CHG Shower Use Special Wash: 1/2 bottle night before surgery and 1/2 bottle morning of surgery.   Please read over the following fact sheets that you were given: Pain Booklet, Coughing and Deep Breathing, MRSA Information and Surgical Site Infection Prevention

## 2012-07-15 ENCOUNTER — Encounter (HOSPITAL_COMMUNITY)
Admission: RE | Admit: 2012-07-15 | Discharge: 2012-07-15 | Disposition: A | Discharge status: Home / Self Care | Payer: Medicaid Other | Source: Ambulatory Visit | Attending: Surgery | Admitting: Surgery

## 2012-07-15 ENCOUNTER — Encounter (HOSPITAL_COMMUNITY): Payer: Self-pay

## 2012-07-15 HISTORY — DX: Incisional hernia without obstruction or gangrene: K43.2

## 2012-07-15 LAB — CBC
MCH: 29.4 pg (ref 26.0–34.0)
MCV: 87.4 fL (ref 78.0–100.0)
Platelets: 141 10*3/uL — ABNORMAL LOW (ref 150–400)
RBC: 3.1 MIL/uL — ABNORMAL LOW (ref 4.22–5.81)
RDW: 16.8 % — ABNORMAL HIGH (ref 11.5–15.5)

## 2012-07-15 LAB — COMPREHENSIVE METABOLIC PANEL
ALT: 14 U/L (ref 0–53)
AST: 33 U/L (ref 0–37)
Albumin: 1.9 g/dL — ABNORMAL LOW (ref 3.5–5.2)
Alkaline Phosphatase: 67 U/L (ref 39–117)
Chloride: 95 mEq/L — ABNORMAL LOW (ref 96–112)
Potassium: 4.7 mEq/L (ref 3.5–5.1)
Total Bilirubin: 1 mg/dL (ref 0.3–1.2)

## 2012-07-18 NOTE — Consult Note (Signed)
Anesthesia Chart Review:  Patient is a 56 year old male scheduled for removal of infected mesh primary incisional hernia repair with biologic mesh by Dr. Magnus Ivan on 07/20/2012. He is status post laparoscopic incisional hernia repair with insertion of mesh on 03/30/2012. Other history includes smoking, hepatitis C with chronic ascites and cirrhosis of the liver, GERD, essential tremors, history of bleeding peptic ulcer s/p surgical repair '81, history of blood transfusion.  CXR on 12/05/11 showed no active cardiopulmonary disease.  EKG on 12/05/11 showed NSR.  Lab reviewed.  Na 130, Cl 95, glucose 108, albumin 1.9, AST/ALT WNL.  H/H 9.1/27.1.  (His HGB has been 8.4-10.4 since 05/13/12 and was 9.0 on 07/10/12.)  PLT 141.  Coags slightly elevated in the past, and not done at PAT.  Will get PT/PTT and T&S on arrival.     Shonna Chock, PA-C

## 2012-07-19 MED ORDER — VANCOMYCIN HCL IN DEXTROSE 1-5 GM/200ML-% IV SOLN
1000.0000 mg | INTRAVENOUS | Status: AC
  Administered 2012-07-20: 1000 mg via INTRAVENOUS
  Filled 2012-07-19: qty 200

## 2012-07-19 NOTE — H&P (Signed)
Noah Medina is an 56 y.o. male.   Chief Complaint: infected seroma HPI: this is an unfortunate gentleman with multiple comorbidities who had a laparoscopic incisional hernia repair with mesh several months ago. He presented several weeks ago with an infected seroma and currently has several drains in place there were placed by interventional radiology. He was found to be MRSA positive in the seroma. He now presents for removal of the mesh and possible placement of biologic mesh. He does complain of mild abdominal discomfort.  Past Medical History  Diagnosis Date  . Cirrhosis of liver   . Hepatitis C 03-28-12    ? 80's-prev. IV drug abuse  . 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 Blood transfusion 03-28-12    '81-hx. of bleeding ulcer  . History of bleeding ulcers 03-28-12    Hx. '81  . GERD (gastroesophageal reflux disease) 03-26-12    Hx. reflux. ulcers in past, occ. OTC med  . Incisional hernia     has drainage system from abdomin    Past Surgical History  Procedure Date  . Umbilical hernia repair   . Hernia repair 08/24/11    ventral hernia with mesh  . Hernia repair 11'11    Umbilical  . Peptic ulcer 03-26-12    open peptic ulcer surgery repair- '81  . Incisional hernia repair 03/26/2012  . Incisional hernia repair 03/30/2012    Procedure: LAPAROSCOPIC INCISIONAL HERNIA;  Surgeon: Shelly Rubenstein, MD;  Location: [facility];  Service: General;  Laterality: N/A;  Laparoscopic Incisional/ventral Hernia Repair with Mesh  . Tonsillectomy   . Appendectomy     Family History  Problem Relation Age of Onset  . Cancer Mother     breast  . Cancer Father     brain  . Heart disease Brother   . Cancer Sister     breast  . Cancer Sister     breast   Social History:  reports that he has been smoking Cigarettes.  He has a 9.5 pack-year smoking history. He has never used smokeless tobacco. He reports that he does not drink alcohol or use illicit  drugs.  Allergies: No Known Allergies  No prescriptions prior to admission    No results found for this or any previous visit (from the past 48 hour(s)). No results found.  Review of Systems  Constitutional: Negative.   HENT: Negative.   Eyes: Negative.   Respiratory: Negative.   Cardiovascular: Negative.   Gastrointestinal: Positive for nausea and abdominal pain.  Genitourinary: Negative.   Musculoskeletal: Positive for myalgias.  Skin: Negative.   Neurological: Positive for tremors.  Endo/Heme/Allergies: Bruises/bleeds easily.  Psychiatric/Behavioral: Negative.     There were no vitals taken for this visit. Physical Exam  Constitutional: He is oriented to person, place, and time. He appears well-developed and well-nourished.  HENT:  Head: Normocephalic and atraumatic.  Eyes: Conjunctivae are normal. Pupils are equal, round, and reactive to light.  Neck: Normal range of motion.  Cardiovascular: Normal rate, regular rhythm, normal heart sounds and intact distal pulses.   Respiratory: Effort normal and breath sounds normal.  GI: Soft. Bowel sounds are normal. He exhibits no distension.       He has 2 Drains in place. The abdominal wall erythema has resolved  Musculoskeletal: Normal range of motion. He exhibits no edema and no tenderness.  Neurological: He is alert and oriented to person, place, and time.  Skin: Skin is warm and dry. No erythema.  Psychiatric: His behavior is normal.     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 He understands the risks in detail including the risks of bleeding, infection, need for transfusion, worsening of his liver failure, placement of biologic mesh, injury to the bowel, recurrence, etc. Surgery will be scheduled. Likelihood of success is  moderate  Gabrielly Mccrystal A 07/19/2012, 10:29 AM

## 2012-07-20 ENCOUNTER — Encounter (HOSPITAL_COMMUNITY): Payer: Self-pay | Admitting: Vascular Surgery

## 2012-07-20 ENCOUNTER — Encounter (HOSPITAL_COMMUNITY): Payer: Self-pay | Admitting: General Practice

## 2012-07-20 ENCOUNTER — Encounter (HOSPITAL_COMMUNITY)
Admission: RE | Disposition: A | Discharge status: Home / Self Care | Payer: Self-pay | Source: Ambulatory Visit | Attending: Surgery

## 2012-07-20 ENCOUNTER — Ambulatory Visit (HOSPITAL_COMMUNITY): Payer: Medicaid Other | Admitting: Vascular Surgery

## 2012-07-20 ENCOUNTER — Inpatient Hospital Stay (HOSPITAL_COMMUNITY)
Admission: RE | Admit: 2012-07-20 | Discharge: 2012-07-25 | DRG: 907 | Disposition: A | Discharge status: Home / Self Care | Payer: Medicaid Other | Source: Ambulatory Visit | Attending: Surgery | Admitting: Surgery

## 2012-07-20 DIAGNOSIS — G250 Essential tremor: Secondary | ICD-10-CM | POA: Diagnosis present

## 2012-07-20 DIAGNOSIS — K432 Incisional hernia without obstruction or gangrene: Secondary | ICD-10-CM | POA: Diagnosis present

## 2012-07-20 DIAGNOSIS — D62 Acute posthemorrhagic anemia: Secondary | ICD-10-CM | POA: Diagnosis not present

## 2012-07-20 DIAGNOSIS — T8579XA Infection and inflammatory reaction due to other internal prosthetic devices, implants and grafts, initial encounter: Principal | ICD-10-CM | POA: Diagnosis present

## 2012-07-20 DIAGNOSIS — K7460 Unspecified cirrhosis of liver: Secondary | ICD-10-CM | POA: Diagnosis present

## 2012-07-20 DIAGNOSIS — D684 Acquired coagulation factor deficiency: Secondary | ICD-10-CM | POA: Diagnosis present

## 2012-07-20 DIAGNOSIS — G252 Other specified forms of tremor: Secondary | ICD-10-CM | POA: Diagnosis present

## 2012-07-20 DIAGNOSIS — A4902 Methicillin resistant Staphylococcus aureus infection, unspecified site: Secondary | ICD-10-CM | POA: Diagnosis present

## 2012-07-20 DIAGNOSIS — E41 Nutritional marasmus: Secondary | ICD-10-CM | POA: Diagnosis present

## 2012-07-20 DIAGNOSIS — T85898A Other specified complication of other internal prosthetic devices, implants and grafts, initial encounter: Secondary | ICD-10-CM

## 2012-07-20 DIAGNOSIS — E871 Hypo-osmolality and hyponatremia: Secondary | ICD-10-CM | POA: Diagnosis present

## 2012-07-20 DIAGNOSIS — F17200 Nicotine dependence, unspecified, uncomplicated: Secondary | ICD-10-CM | POA: Diagnosis present

## 2012-07-20 DIAGNOSIS — Z9089 Acquired absence of other organs: Secondary | ICD-10-CM

## 2012-07-20 DIAGNOSIS — IMO0002 Reserved for concepts with insufficient information to code with codable children: Secondary | ICD-10-CM

## 2012-07-20 DIAGNOSIS — E876 Hypokalemia: Secondary | ICD-10-CM | POA: Diagnosis not present

## 2012-07-20 DIAGNOSIS — B1920 Unspecified viral hepatitis C without hepatic coma: Secondary | ICD-10-CM | POA: Diagnosis present

## 2012-07-20 DIAGNOSIS — R188 Other ascites: Secondary | ICD-10-CM | POA: Diagnosis not present

## 2012-07-20 DIAGNOSIS — K219 Gastro-esophageal reflux disease without esophagitis: Secondary | ICD-10-CM | POA: Diagnosis present

## 2012-07-20 DIAGNOSIS — Z8711 Personal history of peptic ulcer disease: Secondary | ICD-10-CM

## 2012-07-20 HISTORY — PX: INCISIONAL HERNIA REPAIR: SHX193

## 2012-07-20 LAB — PROTIME-INR: Prothrombin Time: 19.9 seconds — ABNORMAL HIGH (ref 11.6–15.2)

## 2012-07-20 LAB — ABO/RH: ABO/RH(D): O POS

## 2012-07-20 SURGERY — REMOVAL, MESH, ABDOMEN OR PELVIS
Anesthesia: General | Site: Abdomen | Wound class: Dirty or Infected

## 2012-07-20 MED ORDER — HEMOSTATIC AGENTS (NO CHARGE) OPTIME
TOPICAL | Status: DC | PRN
  Administered 2012-07-20: 1 via TOPICAL

## 2012-07-20 MED ORDER — BUPIVACAINE HCL (PF) 0.25 % IJ SOLN
INTRAMUSCULAR | Status: AC
  Filled 2012-07-20: qty 30

## 2012-07-20 MED ORDER — ONDANSETRON HCL 4 MG/2ML IJ SOLN: 4.0000 mg | Freq: Four times a day (QID) | INTRAMUSCULAR | Status: DC | PRN

## 2012-07-20 MED ORDER — FENTANYL CITRATE 0.05 MG/ML IJ SOLN
INTRAMUSCULAR | Status: DC | PRN
  Administered 2012-07-20 (×10): 50 ug via INTRAVENOUS

## 2012-07-20 MED ORDER — ROCURONIUM BROMIDE 100 MG/10ML IV SOLN
INTRAVENOUS | Status: DC | PRN
  Administered 2012-07-20: 50 mg via INTRAVENOUS

## 2012-07-20 MED ORDER — HYDROMORPHONE 0.3 MG/ML IV SOLN
INTRAVENOUS | Status: DC
  Administered 2012-07-20: 10:00:00 via INTRAVENOUS
  Administered 2012-07-20: 6.3 mg via INTRAVENOUS
  Administered 2012-07-20: 0.9 mg via INTRAVENOUS
  Administered 2012-07-20: 16:00:00 via INTRAVENOUS
  Administered 2012-07-21: 2.1 mg via INTRAVENOUS
  Administered 2012-07-21: 06:00:00 via INTRAVENOUS
  Administered 2012-07-21: 3.16 mg via INTRAVENOUS
  Administered 2012-07-21: 0.6 mg via INTRAVENOUS
  Filled 2012-07-20 (×2): qty 25

## 2012-07-20 MED ORDER — ONDANSETRON HCL 4 MG PO TABS: 4.0000 mg | ORAL_TABLET | Freq: Four times a day (QID) | ORAL | Status: DC | PRN

## 2012-07-20 MED ORDER — LACTATED RINGERS IV SOLN
INTRAVENOUS | Status: DC | PRN
  Administered 2012-07-20: 08:00:00 via INTRAVENOUS

## 2012-07-20 MED ORDER — LIDOCAINE HCL (CARDIAC) 20 MG/ML IV SOLN
INTRAVENOUS | Status: DC | PRN
  Administered 2012-07-20: 40 mg via INTRAVENOUS

## 2012-07-20 MED ORDER — HYDROMORPHONE 0.3 MG/ML IV SOLN
INTRAVENOUS | Status: AC
  Filled 2012-07-20: qty 25

## 2012-07-20 MED ORDER — HYDROMORPHONE HCL PF 1 MG/ML IJ SOLN: 0.2500 mg | INTRAMUSCULAR | Status: DC | PRN

## 2012-07-20 MED ORDER — DIPHENHYDRAMINE HCL 50 MG/ML IJ SOLN: 12.5000 mg | Freq: Four times a day (QID) | INTRAMUSCULAR | Status: DC | PRN

## 2012-07-20 MED ORDER — NALOXONE HCL 0.4 MG/ML IJ SOLN: 0.4000 mg | INTRAMUSCULAR | Status: DC | PRN

## 2012-07-20 MED ORDER — NEOSTIGMINE METHYLSULFATE 1 MG/ML IJ SOLN
INTRAMUSCULAR | Status: DC | PRN
  Administered 2012-07-20: 3 mg via INTRAVENOUS

## 2012-07-20 MED ORDER — PROPOFOL 10 MG/ML IV EMUL
INTRAVENOUS | Status: DC | PRN
  Administered 2012-07-20: 140 mg via INTRAVENOUS

## 2012-07-20 MED ORDER — DIPHENHYDRAMINE HCL 12.5 MG/5ML PO ELIX
12.5000 mg | ORAL_SOLUTION | Freq: Four times a day (QID) | ORAL | Status: DC | PRN
  Filled 2012-07-20: qty 5

## 2012-07-20 MED ORDER — ONDANSETRON HCL 4 MG/2ML IJ SOLN
INTRAMUSCULAR | Status: DC | PRN
  Administered 2012-07-20: 4 mg via INTRAVENOUS

## 2012-07-20 MED ORDER — SODIUM CHLORIDE 0.9 % IJ SOLN: 9.0000 mL | INTRAMUSCULAR | Status: DC | PRN

## 2012-07-20 MED ORDER — GLYCOPYRROLATE 0.2 MG/ML IJ SOLN
INTRAMUSCULAR | Status: DC | PRN
  Administered 2012-07-20: 0.4 mg via INTRAVENOUS

## 2012-07-20 MED ORDER — POTASSIUM CHLORIDE IN NACL 20-0.9 MEQ/L-% IV SOLN
INTRAVENOUS | Status: DC
  Administered 2012-07-20 – 2012-07-24 (×5): via INTRAVENOUS
  Filled 2012-07-20 (×12): qty 1000

## 2012-07-20 MED ORDER — ALBUMIN HUMAN 5 % IV SOLN
INTRAVENOUS | Status: DC | PRN
  Administered 2012-07-20: 10:00:00 via INTRAVENOUS

## 2012-07-20 MED ORDER — MIDAZOLAM HCL 5 MG/5ML IJ SOLN
INTRAMUSCULAR | Status: DC | PRN
  Administered 2012-07-20 (×2): 1 mg via INTRAVENOUS

## 2012-07-20 MED ORDER — 0.9 % SODIUM CHLORIDE (POUR BTL) OPTIME
TOPICAL | Status: DC | PRN
  Administered 2012-07-20 (×4): 1000 mL

## 2012-07-20 MED ORDER — HYDROMORPHONE HCL PF 1 MG/ML IJ SOLN
INTRAMUSCULAR | Status: AC
  Filled 2012-07-20: qty 1

## 2012-07-20 MED ORDER — VANCOMYCIN HCL IN DEXTROSE 1-5 GM/200ML-% IV SOLN
1000.0000 mg | Freq: Two times a day (BID) | INTRAVENOUS | Status: DC
  Administered 2012-07-20 – 2012-07-25 (×10): 1000 mg via INTRAVENOUS
  Filled 2012-07-20 (×12): qty 200

## 2012-07-20 SURGICAL SUPPLY — 51 items
BLADE SURG ROTATE 9660 (MISCELLANEOUS) IMPLANT
CANISTER SUCTION 2500CC (MISCELLANEOUS) ×2 IMPLANT
CHLORAPREP W/TINT 26ML (MISCELLANEOUS) ×2 IMPLANT
CLOTH BEACON ORANGE TIMEOUT ST (SAFETY) ×2 IMPLANT
COVER SURGICAL LIGHT HANDLE (MISCELLANEOUS) ×2 IMPLANT
DRAIN CHANNEL 19F RND (DRAIN) ×1 IMPLANT
DRAPE LAPAROSCOPIC ABDOMINAL (DRAPES) ×2 IMPLANT
ELECT CAUTERY BLADE 6.4 (BLADE) ×2 IMPLANT
ELECT REM PT RETURN 9FT ADLT (ELECTROSURGICAL) ×2
ELECTRODE REM PT RTRN 9FT ADLT (ELECTROSURGICAL) ×1 IMPLANT
EVACUATOR SILICONE 100CC (DRAIN) ×1 IMPLANT
GLOVE BIO SURGEON STRL SZ 6.5 (GLOVE) ×1 IMPLANT
GLOVE BIO SURGEON STRL SZ7.5 (GLOVE) ×2 IMPLANT
GLOVE BIOGEL PI IND STRL 7.5 (GLOVE) IMPLANT
GLOVE BIOGEL PI INDICATOR 7.5 (GLOVE) ×3
GLOVE SURG SIGNA 7.5 PF LTX (GLOVE) ×2 IMPLANT
GOWN PREVENTION PLUS XLARGE (GOWN DISPOSABLE) ×2 IMPLANT
GOWN STRL NON-REIN LRG LVL3 (GOWN DISPOSABLE) ×4 IMPLANT
HEMOSTAT SNOW SURGICEL 2X4 (HEMOSTASIS) ×1 IMPLANT
KIT BASIN OR (CUSTOM PROCEDURE TRAY) ×2 IMPLANT
KIT ROOM TURNOVER OR (KITS) ×2 IMPLANT
MATRIX SURGICAL PSMT 20X20CM (Tissue) ×1 IMPLANT
MICROMATRIX 500MG (Tissue) ×2 IMPLANT
NDL HYPO 25GX1X1/2 BEV (NEEDLE) IMPLANT
NEEDLE HYPO 25GX1X1/2 BEV (NEEDLE) ×2 IMPLANT
NS IRRIG 1000ML POUR BTL (IV SOLUTION) ×5 IMPLANT
PACK GENERAL/GYN (CUSTOM PROCEDURE TRAY) ×2 IMPLANT
PAD ARMBOARD 7.5X6 YLW CONV (MISCELLANEOUS) ×3 IMPLANT
SOLUTION PARTIC MCRMTRX 500MG (Tissue) IMPLANT
SPONGE GAUZE 4X4 12PLY (GAUZE/BANDAGES/DRESSINGS) ×2 IMPLANT
SPONGE LAP 18X18 X RAY DECT (DISPOSABLE) ×5 IMPLANT
STAPLER VISISTAT 35W (STAPLE) IMPLANT
SUT MNCRL AB 4-0 PS2 18 (SUTURE) ×1 IMPLANT
SUT NOVA 1 T20/GS 25DT (SUTURE) ×3 IMPLANT
SUT NOVA NAB DX-16 0-1 5-0 T12 (SUTURE) IMPLANT
SUT PROLENE 1 CT (SUTURE) IMPLANT
SUT SILK 2 0 (SUTURE) ×2
SUT SILK 2 0 SH CR/8 (SUTURE) ×1 IMPLANT
SUT SILK 2-0 18XBRD TIE 12 (SUTURE) IMPLANT
SUT SILK 3 0 SH CR/8 (SUTURE) ×1 IMPLANT
SUT VIC AB 2-0 CT1 27 (SUTURE) ×4
SUT VIC AB 2-0 CT1 TAPERPNT 27 (SUTURE) IMPLANT
SUT VIC AB 2-0 SH 27 (SUTURE) ×2
SUT VIC AB 2-0 SH 27X BRD (SUTURE) IMPLANT
SUT VIC AB 3-0 SH 27 (SUTURE)
SUT VIC AB 3-0 SH 27XBRD (SUTURE) ×1 IMPLANT
SYR CONTROL 10ML LL (SYRINGE) ×1 IMPLANT
TOWEL OR 17X24 6PK STRL BLUE (TOWEL DISPOSABLE) ×2 IMPLANT
TOWEL OR 17X26 10 PK STRL BLUE (TOWEL DISPOSABLE) ×2 IMPLANT
TRAY FOLEY CATH 14FR (SET/KITS/TRAYS/PACK) ×1 IMPLANT
TRAY FOLEY CATH 14FRSI W/METER (CATHETERS) IMPLANT

## 2012-07-20 NOTE — Transfer of Care (Signed)
Immediate Anesthesia Transfer of Care Note  Patient: Noah Medina  Procedure(s) Performed: Procedure(s) (LRB): EXCISION OF MESH (N/A) HERNIA REPAIR INCISIONAL (N/A) INSERTION OF MESH (N/A)  Patient Location: PACU  Anesthesia Type: General  Level of Consciousness: awake, alert , oriented and patient cooperative  Airway & Oxygen Therapy: Patient Spontanous Breathing and Patient connected to nasal cannula oxygen  Post-op Assessment: Report given to PACU RN, Post -op Vital signs reviewed and stable and Patient moving all extremities X 4  Post vital signs: Reviewed and stable  Complications: No apparent anesthesia complications

## 2012-07-20 NOTE — Anesthesia Procedure Notes (Signed)
Procedure Name: Intubation Date/Time: 07/20/2012 8:38 AM Performed by: Margaree Mackintosh Pre-anesthesia Checklist: Patient identified, Emergency Drugs available, Suction available, Patient being monitored and Timeout performed Patient Re-evaluated:Patient Re-evaluated prior to inductionOxygen Delivery Method: Circle system utilized Preoxygenation: Pre-oxygenation with 100% oxygen Intubation Type: IV induction Ventilation: Mask ventilation without difficulty Laryngoscope Size: Mac and 3 Grade View: Grade I Tube type: Oral Tube size: 7.5 mm Number of attempts: 1 Airway Equipment and Method: Stylet Placement Confirmation: ETT inserted through vocal cords under direct vision,  positive ETCO2 and breath sounds checked- equal and bilateral Secured at: 21 cm Tube secured with: Tape Dental Injury: Teeth and Oropharynx as per pre-operative assessment

## 2012-07-20 NOTE — Anesthesia Preprocedure Evaluation (Addendum)
Anesthesia Evaluation  Patient identified by MRN, date of birth, ID band Patient awake    Reviewed: Allergy & Precautions, H&P , NPO status , Patient's Chart, lab work & pertinent test results  History of Anesthesia Complications Negative for: history of anesthetic complications  Airway Mallampati: I TM Distance: >3 FB Neck ROM: Full    Dental No notable dental hx. (+) Edentulous Upper and Edentulous Lower   Pulmonary Current Smoker,  breath sounds clear to auscultation  Pulmonary exam normal       Cardiovascular negative cardio ROS  Rhythm:Regular Rate:Normal     Neuro/Psych negative neurological ROS  negative psych ROS   GI/Hepatic GERD-  Medicated and Controlled,(+) Cirrhosis -       , Hepatitis -, C  Endo/Other  negative endocrine ROS  Renal/GU negative Renal ROS  negative genitourinary   Musculoskeletal   Abdominal   Peds  Hematology negative hematology ROS (+)   Anesthesia Other Findings   Reproductive/Obstetrics negative OB ROS                          Anesthesia Physical Anesthesia Plan  ASA: III  Anesthesia Plan: General   Post-op Pain Management:    Induction: Intravenous  Airway Management Planned: Oral ETT  Additional Equipment:   Intra-op Plan:   Post-operative Plan: Extubation in OR  Informed Consent: I have reviewed the patients History and Physical, chart, labs and discussed the procedure including the risks, benefits and alternatives for the proposed anesthesia with the patient or authorized representative who has indicated his/her understanding and acceptance.   Dental advisory given  Plan Discussed with: CRNA  Anesthesia Plan Comments:         Anesthesia Quick Evaluation

## 2012-07-20 NOTE — Anesthesia Postprocedure Evaluation (Signed)
  Anesthesia Post-op Note  Patient: CHEO SELVEY  Procedure(s) Performed: Procedure(s) (LRB): EXCISION OF MESH (N/A) HERNIA REPAIR INCISIONAL (N/A) INSERTION OF MESH (N/A)  Patient Location: PACU  Anesthesia Type: General  Level of Consciousness: awake and alert   Airway and Oxygen Therapy: Patient Spontanous Breathing and Patient connected to nasal cannula oxygen  Post-op Pain: moderate  Post-op Assessment: Post-op Vital signs reviewed, Patient's Cardiovascular Status Stable and Respiratory Function Stable  Post-op Vital Signs: Reviewed and stable  Complications: No apparent anesthesia complications

## 2012-07-20 NOTE — Op Note (Signed)
EXCISION OF MESH, HERNIA REPAIR INCISIONAL, INSERTION OF MESH  Procedure Note  Noah Medina 07/20/2012   Pre-op Diagnosis: infected intra-abdominal mesh       Post-op Diagnosis: same  Procedure(s): EXCISION OF MESH HERNIA REPAIR INCISIONAL INSERTION OF BIOLOGIC MESH (20cm x 20cm Medina-cell)  Surgeon(s): Shelly Rubenstein, MD  Anesthesia: General  Staff:  Caprice Kluver, RN - Circulator Janeece Agee Pingue, CST - Scrub Person Megan Day Cavanaugh, RN - Careers adviser  Estimated Blood Loss: 500               Indications: This is an unfortunate 56 year old gentleman with cirrhosis who has had Medina previous laparoscopic incisional hernia repair with mesh earlier this year. He developed cellulitis of his abdominal wall several weeks ago and now has an infected seroma with drain in place. He now presents for removal of the mesh and placement of bilateral mesh.  Procedure: The patient was brought to the operating room and identified as the correct patient. He was placed supine on the operating room table and general anesthesia was induced. His abdomen was prepped and draped in the usual sterile fashion. I excised the patient's previous scar with Medina scalpel. I took this down through the thickened infected subcutaneous case tissue which I excised with cautery. I then identified the mesh. There was infected seroma over the top of the mesh as well as underneath the mesh. I had already removed his drains prior to opening. I was able to separate the mesh from the fascia at the lower midline and excised the mesh circumferentially from the fascia. The patient had Medina significant amount of bleeding from the subcutaneous tissue and omentum. I appeared to achieve hemostasis with the cautery as well as 2-0 silk suture ligatures and ties as well as several pieces of surgical Snow. At this point, it became apparent that I could not mobilize the fascia were undermined the skin further without  experiencing Medina significant amount of blood loss secondary to cirrhosis. Medina 20 cm x 20 cm piece of biologic Medina-cell mesh was brought to the field. I fashioned it in Medina rounded manner. I then placed it underneath the visible fascia and subcutaneous tissue and sutured in place with interrupted #1 Novafil sutures. Prior this thoroughly irrigated the wound with saline. I then made Medina separate skin incision and placed Medina 19 [place] Blake drain on top of the mesh. The biologic powder was then placed over the top of the mesh which fenestrated. I also placed the powder on the subcutaneous tissue. I then closed the subcutaneous tissue over the top of the mesh with Medina running 2-0 Vicryl suture. The skin was then closed with skin staples. Hemostasis, again, appeared to be achieved. Gauze bandages were then applied. The patient tolerated the procedure well. All the counts were correct at the end of the procedure. The patient was then extubated in the operating room and taken in Medina stable condition to the recovery room.          Noah Medina   Date: 07/20/2012  Time: 10:01 AM

## 2012-07-20 NOTE — Progress Notes (Signed)
ANTIBIOTIC CONSULT NOTE - INITIAL  Pharmacy Consult for vancomycin Indication: empiric for infected intra-abdominal mesh  No Known Allergies  Patient Measurements:    Vital Signs: Temp: 97.6 F (36.4 C) (08/07 1108) Temp src: Oral (08/07 1108) BP: 107/55 mmHg (08/07 1108) Pulse Rate: 88  (08/07 1108) Intake/Output from previous day:   Intake/Output from this shift: Total I/O In: 1150 [I.V.:900; IV Piggyback:250] Out: 573 [Urine:125; Drains:48; Blood:400]  Labs: No results found for this basename: WBC:3,HGB:3,PLT:3,LABCREA:3,CREATININE:3 in the last 72 hours The CrCl is unknown because both a height and weight (above a minimum accepted value) are required for this calculation. No results found for this basename: VANCOTROUGH:2,VANCOPEAK:2,VANCORANDOM:2,GENTTROUGH:2,GENTPEAK:2,GENTRANDOM:2,TOBRATROUGH:2,TOBRAPEAK:2,TOBRARND:2,AMIKACINPEAK:2,AMIKACINTROU:2,AMIKACIN:2, in the last 72 hours   Microbiology: Recent Results (from the past 720 hour(s))  BODY FLUID CULTURE     Status: Normal   Collection Time   07/02/12 10:55 AM      Component Value Range Status Comment   Specimen Description PERITONEAL CAVITY   Final    Special Requests NONE   Final    Gram Stain     Final    Value: WBC PRESENT, PREDOMINANTLY PMN     GRAM POSITIVE COCCI IN PAIRS   Culture     Final    Value: ABUNDANT METHICILLIN RESISTANT STAPHYLOCOCCUS AUREUS     Note: RIFAMPIN AND GENTAMICIN SHOULD NOT BE USED AS SINGLE DRUGS FOR TREATMENT OF STAPH INFECTIONS. This organism DOES NOT demonstrate inducible Clindamycin resistance in vitro.   Report Status 07/04/2012 FINAL   Final    Organism ID, Bacteria METHICILLIN RESISTANT STAPHYLOCOCCUS AUREUS   Final   CULTURE, ROUTINE-ABSCESS     Status: Normal   Collection Time   07/02/12  3:21 PM      Component Value Range Status Comment   Specimen Description DRAINAGE   Final    Special Requests Normal   Final    Gram Stain     Final    Value: ABUNDANT WBC PRESENT,  PREDOMINANTLY PMN     NO SQUAMOUS EPITHELIAL CELLS SEEN     FEW GRAM POSITIVE COCCI IN CLUSTERS     CRITICAL RESULT CALLED TO, READ BACK BY AND VERIFIED WITH: CRITICAL RESULT CALLED TO, READ BACK BY AND VERIFIED WITH: PATTY CORBITT 6:45PM 07/02/12 BY RUSCA.   Culture     Final    Value: ABUNDANT METHICILLIN RESISTANT STAPHYLOCOCCUS AUREUS     Note: RIFAMPIN AND GENTAMICIN SHOULD NOT BE USED AS SINGLE DRUGS FOR TREATMENT OF STAPH INFECTIONS. This organism DOES NOT demonstrate inducible Clindamycin resistance in vitro. CRITICAL RESULT CALLED TO, READ BACK BY AND VERIFIED WITH: CLARISSA S BY      INGRAM A 7/23 845AM   Report Status 07/05/2012 FINAL   Final    Organism ID, Bacteria METHICILLIN RESISTANT STAPHYLOCOCCUS AUREUS   Final   ANAEROBIC CULTURE     Status: Normal   Collection Time   07/02/12  3:21 PM      Component Value Range Status Comment   Specimen Description OTHER ABSCESS DRAINAGE PERITONEAL   Final    Special Requests Normal   Final    Gram Stain     Final    Value: ABUNDANT WBC PRESENT, PREDOMINANTLY PMN     NO SQUAMOUS EPITHELIAL CELLS SEEN     FEW GRAM POSITIVE COCCI IN CLUSTERS   Culture NO ANAEROBES ISOLATED   Final    Report Status 07/07/2012 FINAL   Final   SURGICAL PCR SCREEN     Status: Normal   Collection  Time   07/15/12  8:31 AM      Component Value Range Status Comment   MRSA, PCR NEGATIVE  NEGATIVE Final    Staphylococcus aureus NEGATIVE  NEGATIVE Final     Medical History: Past Medical History  Diagnosis Date  . Cirrhosis of liver   . Hepatitis C 03-28-12    ? 80's-prev. IV drug abuse  . 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 Blood transfusion 03-28-12    '81-hx. of bleeding ulcer  . History of bleeding ulcers 03-28-12    Hx. '81  . GERD (gastroesophageal reflux disease) 03-26-12    Hx. reflux. ulcers in past, occ. OTC med  . Incisional hernia     has drainage system from abdomin    Medications:  Prescriptions prior to  admission  Medication Sig Dispense Refill  . bumetanide (BUMEX) 2 MG tablet Take 2 mg by mouth daily.      Marland Kitchen doxycycline (VIBRA-TABS) 100 MG tablet Take 100 mg by mouth 2 (two) times daily. Starting 07/10/12 for 10 days      . HYDROmorphone (DILAUDID) 4 MG tablet Take 4 mg by mouth every 4 (four) hours as needed. pain      . lactulose (CHRONULAC) 10 GM/15ML solution Take 30 mLs (20 g total) by mouth daily.  240 mL  1  . Multiple Vitamin (MULITIVITAMIN WITH MINERALS) TABS Take 1 tablet by mouth daily.      Marland Kitchen omeprazole (PRILOSEC) 20 MG capsule Take 20 mg by mouth daily.      . ondansetron (ZOFRAN) 4 MG tablet Take 4 mg by mouth every 8 (eight) hours as needed. For nausea      . spironolactone (ALDACTONE) 100 MG tablet Take 100 mg by mouth daily before breakfast.       . naproxen sodium (ANAPROX) 220 MG tablet Take 220 mg by mouth 2 (two) times daily with a meal.       Assessment: 56 yo M with cirrhosis and previous laproscopic incisional hernia repair with mesh earlier this year.  Developed cellulitis of abdominal wall several weeks ago leading to infected seroma with drain placed.  Now s/p OR today (8/7) for excision of mesh, hernia repair and insertion of new mesh.  Received vancomcyin 1g x 1 at 0820 this am.   Goal of Therapy:  Vancomycin trough level 10-15 mcg/ml  Plan:  - Vancomycin 1000 mg IV q12h - next dose due at 2000 - Follow up SCr, UOP, cultures, clinical course and adjust as clinically indicated.  Drusilla Kanner 07/20/2012,11:40 AM

## 2012-07-20 NOTE — Interval H&P Note (Signed)
History and Physical Interval Note: no change in h and p  07/20/2012 7:53 AM  [name]  has presented today for surgery, with the diagnosis of infected intra-abdominal mesh    The various methods of treatment have been discussed with the patient and family. After consideration of risks, benefits and other options for treatment, the patient has consented to  Procedure(s) (LRB): EXCISION OF MESH (N/A) HERNIA REPAIR INCISIONAL (N/A) INSERTION OF MESH (N/A) as a surgical intervention .  The patient's history has been reviewed, patient examined, no change in status, stable for surgery.  I have reviewed the patient's chart and labs.  Questions were answered to the patient's satisfaction.     Noah Medina A

## 2012-07-20 NOTE — Preoperative (Signed)
Beta Blockers   Reason not to administer Beta Blockers:Not Applicable 

## 2012-07-21 LAB — CBC
MCH: 29.1 pg (ref 26.0–34.0)
MCV: 88.2 fL (ref 78.0–100.0)
Platelets: ADEQUATE 10*3/uL (ref 150–400)
RBC: 2.2 MIL/uL — ABNORMAL LOW (ref 4.22–5.81)

## 2012-07-21 LAB — BASIC METABOLIC PANEL
CO2: 23 mEq/L (ref 19–32)
Calcium: 7.6 mg/dL — ABNORMAL LOW (ref 8.4–10.5)
Creatinine, Ser: 0.79 mg/dL (ref 0.50–1.35)
Glucose, Bld: 122 mg/dL — ABNORMAL HIGH (ref 70–99)

## 2012-07-21 MED ORDER — SPIRONOLACTONE 100 MG PO TABS
100.0000 mg | ORAL_TABLET | Freq: Every day | ORAL | Status: DC
  Administered 2012-07-21 – 2012-07-25 (×5): 100 mg via ORAL
  Filled 2012-07-21 (×6): qty 1

## 2012-07-21 MED ORDER — HYDROMORPHONE HCL 2 MG PO TABS
4.0000 mg | ORAL_TABLET | ORAL | Status: DC | PRN
  Administered 2012-07-21 – 2012-07-25 (×27): 4 mg via ORAL
  Filled 2012-07-21 (×27): qty 2

## 2012-07-21 MED ORDER — PANTOPRAZOLE SODIUM 40 MG IV SOLR
40.0000 mg | Freq: Every day | INTRAVENOUS | Status: DC
  Administered 2012-07-21 – 2012-07-22 (×2): 40 mg via INTRAVENOUS
  Filled 2012-07-21 (×3): qty 40

## 2012-07-21 NOTE — Progress Notes (Signed)
1 Day Post-Op  Subjective: POD#1 Complains of incisional pain Denies nausea  Objective: Vital signs in last 24 hours: Temp:  [97.5 F (36.4 C)-98.7 F (37.1 C)] 98.7 F (37.1 C) (08/08 0519) Pulse Rate:  [88-110] 94  (08/08 0519) Resp:  [10-26] 15  (08/08 0620) BP: (107-137)/(55-69) 113/60 mmHg (08/08 0519) SpO2:  [95 %-100 %] 100 % (08/08 0620) Weight:  [175 lb 4.8 oz (79.516 kg)] 175 lb 4.8 oz (79.516 kg) (08/07 1142) Last BM Date: 07/20/12  Intake/Output from previous day: 08/07 0701 - 08/08 0700 In: 3210 [P.O.:240; I.V.:2720; IV Piggyback:250] Out: 1583 [Urine:925; Drains:258; Blood:400] Intake/Output this shift:    Pale Lungs clear CV slightly tachy Abdomen full, drain serosang  Lab Results:   Basename 07/21/12 0730  WBC 7.9  HGB 6.4*  HCT 19.4*  PLT PLATELETS APPEAR ADEQUATE   BMET  Basename 07/21/12 0730  NA 134*  K 4.8  CL 107  CO2 23  GLUCOSE 122*  BUN 14  CREATININE 0.79  CALCIUM 7.6*   PT/INR  Basename 07/20/12 0726  LABPROT 19.9*  INR 1.66*   ABG No results found for this basename: PHART:2,PCO2:2,PO2:2,HCO3:2 in the last 72 hours  Studies/Results: No results found.  Anti-infectives: Anti-infectives     Start     Dose/Rate Route Frequency Ordered Stop   07/20/12 2000   vancomycin (VANCOCIN) IVPB 1000 mg/200 mL premix        1,000 mg 200 mL/hr over 60 Minutes Intravenous Every 12 hours 07/20/12 1149     07/19/12 1442   vancomycin (VANCOCIN) IVPB 1000 mg/200 mL premix        1,000 mg 200 mL/hr over 60 Minutes Intravenous 120 min pre-op 07/19/12 1442 07/20/12 0820          Assessment/Plan: s/p Procedure(s) (LRB): EXCISION OF MESH (N/A) HERNIA REPAIR INCISIONAL (N/A) INSERTION OF MESH (N/A)  Post op blood loss anemia.  Will transfuse 2 units of PRBC and 2 units of FFP Clear liquid diet  LOS: 1 day    Noah Medina A 07/21/2012

## 2012-07-22 ENCOUNTER — Encounter (HOSPITAL_COMMUNITY): Payer: Self-pay | Admitting: Surgery

## 2012-07-22 LAB — PROTIME-INR
INR: 1.77 — ABNORMAL HIGH (ref 0.00–1.49)
Prothrombin Time: 20.9 seconds — ABNORMAL HIGH (ref 11.6–15.2)

## 2012-07-22 LAB — BASIC METABOLIC PANEL
CO2: 23 mEq/L (ref 19–32)
Calcium: 7.4 mg/dL — ABNORMAL LOW (ref 8.4–10.5)
Chloride: 105 mEq/L (ref 96–112)
Glucose, Bld: 121 mg/dL — ABNORMAL HIGH (ref 70–99)
Potassium: 3.6 mEq/L (ref 3.5–5.1)
Sodium: 132 mEq/L — ABNORMAL LOW (ref 135–145)

## 2012-07-22 LAB — PREPARE FRESH FROZEN PLASMA

## 2012-07-22 LAB — CBC
Hemoglobin: 6.9 g/dL — CL (ref 13.0–17.0)
MCH: 29.7 pg (ref 26.0–34.0)
RBC: 2.32 MIL/uL — ABNORMAL LOW (ref 4.22–5.81)

## 2012-07-22 LAB — PREPARE RBC (CROSSMATCH)

## 2012-07-22 NOTE — Progress Notes (Signed)
2 Days Post-Op  Subjective: POD#2 Feeling much better Denies nausea Pain control improved  Objective: Vital signs in last 24 hours: Temp:  [97.8 F (36.6 C)-98.6 F (37 C)] 97.9 F (36.6 C) (08/09 0550) Pulse Rate:  [83-91] 85  (08/09 0550) Resp:  [15-20] 20  (08/09 0550) BP: (100-122)/(44-60) 109/52 mmHg (08/09 0550) SpO2:  [97 %-100 %] 98 % (08/09 0550) Last BM Date: 07/20/12  Intake/Output from previous day: 08/08 0701 - 08/09 0700 In: 4931 [P.O.:600; I.V.:2690; Blood:1351; IV Piggyback:200] Out: 1565 [Urine:1350; Drains:215] Intake/Output this shift:    Less pale Abdomen soft, incision clean, drain serosang  Lab Results:   Basename 07/22/12 0625 07/21/12 0730  WBC 5.0 7.9  HGB 6.9* 6.4*  HCT 20.3* 19.4*  PLT 87* PLATELETS APPEAR ADEQUATE   BMET  Basename 07/21/12 0730  NA 134*  K 4.8  CL 107  CO2 23  GLUCOSE 122*  BUN 14  CREATININE 0.79  CALCIUM 7.6*   PT/INR  Basename 07/22/12 0625 07/20/12 0726  LABPROT 20.9* 19.9*  INR 1.77* 1.66*   ABG No results found for this basename: PHART:2,PCO2:2,PO2:2,HCO3:2 in the last 72 hours  Studies/Results: No results found.  Anti-infectives: Anti-infectives     Start     Dose/Rate Route Frequency Ordered Stop   07/20/12 2000   vancomycin (VANCOCIN) IVPB 1000 mg/200 mL premix        1,000 mg 200 mL/hr over 60 Minutes Intravenous Every 12 hours 07/20/12 1149     07/19/12 1442   vancomycin (VANCOCIN) IVPB 1000 mg/200 mL premix        1,000 mg 200 mL/hr over 60 Minutes Intravenous 120 min pre-op 07/19/12 1442 07/20/12 0820          Assessment/Plan: s/p Procedure(s) (LRB): EXCISION OF MESH (N/A) HERNIA REPAIR INCISIONAL (N/A) INSERTION OF MESH (N/A)  Postop blood loss anemia:   Will transfuse two more units of blood Advance po Continue IV antibiotics  LOS: 2 days    Jennife Zaucha A 07/22/2012

## 2012-07-23 LAB — TYPE AND SCREEN
Antibody Screen: NEGATIVE
Unit division: 0
Unit division: 0

## 2012-07-23 LAB — CBC
Platelets: 95 10*3/uL — ABNORMAL LOW (ref 150–400)
RBC: 2.9 MIL/uL — ABNORMAL LOW (ref 4.22–5.81)
WBC: 5.5 10*3/uL (ref 4.0–10.5)

## 2012-07-23 MED ORDER — PANTOPRAZOLE SODIUM 40 MG PO TBEC
40.0000 mg | DELAYED_RELEASE_TABLET | Freq: Every day | ORAL | Status: DC
  Administered 2012-07-23 – 2012-07-24 (×2): 40 mg via ORAL
  Filled 2012-07-23 (×3): qty 1

## 2012-07-23 NOTE — Progress Notes (Signed)
3 Days Post-Op  Subjective: He feels okay. He is passing lots of gas. He has not had a bowel movement. Pain is controlled with pain medication. He's had no nausea. Tolerating diet.  Objective: Vital signs in last 24 hours: Temp:  [97.8 F (36.6 C)-98.5 F (36.9 C)] 98.1 F (36.7 C) (08/10 0537) Pulse Rate:  [80-88] 80  (08/10 0537) Resp:  [16-20] 18  (08/10 0537) BP: (108-126)/(49-66) 117/58 mmHg (08/10 0537) SpO2:  [96 %-100 %] 96 % (08/10 0537)   Intake/Output from previous day: 08/09 0701 - 08/10 0700 In: 2687 [P.O.:720; I.V.:1402; Blood:25; IV Piggyback:400] Out: 1245 [Urine:1175; Drains:70] Intake/Output this shift:     General appearance: alert and no distress Resp: clear to auscultation bilaterally GI: the abdomen is distended but I think this is his baseline. Soft. It is not particular tender. There are some bowel sounds present.  Incision: healing well  Lab Results:   Basename 07/23/12 0535 07/22/12 0625  WBC 5.5 5.0  HGB 8.7* 6.9*  HCT 25.1* 20.3*  PLT 95* 87*   BMET  Basename 07/22/12 0625 07/21/12 0730  NA 132* 134*  K 3.6 4.8  CL 105 107  CO2 23 23  GLUCOSE 121* 122*  BUN 9 14  CREATININE 0.68 0.79  CALCIUM 7.4* 7.6*   PT/INR  Basename 07/22/12 0625  LABPROT 20.9*  INR 1.77*   ABG No results found for this basename: PHART:2,PCO2:2,PO2:2,HCO3:2 in the last 72 hours  MEDS, Scheduled    . pantoprazole (PROTONIX) IV  40 mg Intravenous QHS  . spironolactone  100 mg Oral Daily  . vancomycin  1,000 mg Intravenous Q12H    Studies/Results: No results found.  Assessment: s/p Procedure(s): EXCISION OF MESH HERNIA REPAIR INCISIONAL INSERTION OF MESH Patient Active Problem List  Diagnosis  . Cirrhosis  . Seroma, postoperative, possible abscess  . Cirrhosis of liver with history of hepatitis C  . Status post repair of ventral hernia  . Hypokalemia  . Severe malnutrition  . Ascites  . Normocytic anemia  . Hyponatremia  . MRSA  (methicillin resistant staph aureus) culture positive  . Infected seroma, postoperative    Stable postop. Abdominal distention may well be his baseline. Hemoglobin is improved by 2.0 for 2 unit transfusion.  Plan: we'll continue current management. Need to observe his abdomen for another day or 2. We'll recheck his hemoglobin in morning.   LOS: 3 days     Currie Paris, MD, [facility], [place] [phone]   07/23/2012 9:56 AM

## 2012-07-23 NOTE — Progress Notes (Signed)
  ID: empiric vanc s/p infected mesh repair 8/7; afeb, wbc 5.5; no micro data; MRSA negative. Team will observe his abdomen for another day or 2  Goal trough 10-15  vanc 8/7>>  Plan:  - Continue Vancomycin 1000 mg IV q12h. - Follow up SCr, UOP, cultures, clinical course and adjust as clinically indicated. - Vanco trough w/ tom am dose

## 2012-07-24 LAB — CBC
HCT: 25.7 % — ABNORMAL LOW (ref 39.0–52.0)
Hemoglobin: 8.8 g/dL — ABNORMAL LOW (ref 13.0–17.0)
RBC: 2.94 MIL/uL — ABNORMAL LOW (ref 4.22–5.81)
WBC: 4.6 10*3/uL (ref 4.0–10.5)

## 2012-07-24 LAB — COMPREHENSIVE METABOLIC PANEL
ALT: 12 U/L (ref 0–53)
Albumin: 1.8 g/dL — ABNORMAL LOW (ref 3.5–5.2)
Alkaline Phosphatase: 65 U/L (ref 39–117)
BUN: 8 mg/dL (ref 6–23)
Chloride: 101 mEq/L (ref 96–112)
GFR calc Af Amer: >90 mL/min (ref >90)
Glucose, Bld: 99 mg/dL (ref 70–99)
Potassium: 4.3 mEq/L (ref 3.5–5.1)
Sodium: 131 mEq/L — ABNORMAL LOW (ref 135–145)
Total Bilirubin: 0.7 mg/dL (ref 0.3–1.2)

## 2012-07-24 LAB — VANCOMYCIN, TROUGH: Vancomycin Tr: 11.9 ug/mL (ref 10.0–20.0)

## 2012-07-24 MED ORDER — LACTULOSE 10 GM/15ML PO SOLN
20.0000 g | Freq: Every day | ORAL | Status: DC
  Administered 2012-07-24 – 2012-07-25 (×2): 20 g via ORAL
  Filled 2012-07-24 (×2): qty 30

## 2012-07-24 NOTE — Progress Notes (Signed)
ANTIBIOTIC CONSULT NOTE - INITIAL  Pharmacy Consult for vancomycin Indication: empiric for infected intra-abdominal mesh  No Known Allergies  Patient Measurements: Height: 5\' 11"  (180.3 cm) Weight: 175 lb 4.8 oz (79.516 kg) (transcribed from preadmit) IBW/kg (Calculated) : 75.3   Vital Signs: Temp: 97.5 F (36.4 C) (08/11 0606) Temp src: Oral (08/11 0606) BP: 111/53 mmHg (08/11 0606) Pulse Rate: 86  (08/11 0606) Intake/Output from previous day: 08/10 0701 - 08/11 0700 In: 1656 [P.O.:480; I.V.:1176] Out: 670 [Urine:500; Drains:170] Intake/Output from this shift: Total I/O In: 50 [Other:50] Out: -   Labs:  Basename 07/24/12 0702 07/23/12 0535 07/22/12 0625  WBC 4.6 5.5 5.0  HGB 8.8* 8.7* 6.9*  PLT 105* 95* 87*  LABCREA -- -- --  CREATININE -- -- 0.68   Estimated Creatinine Clearance: 108.5 ml/min (by C-G formula based on Cr of 0.68).  Basename 07/24/12 0702  VANCOTROUGH 11.9  VANCOPEAK --  VANCORANDOM --  GENTTROUGH --  GENTPEAK --  GENTRANDOM --  TOBRATROUGH --  TOBRAPEAK --  TOBRARND --  AMIKACINPEAK --  AMIKACINTROU --  AMIKACIN --     Microbiology: Recent Results (from the past 720 hour(s))  BODY FLUID CULTURE     Status: Normal   Collection Time   07/02/12 10:55 AM      Component Value Range Status Comment   Specimen Description PERITONEAL CAVITY   Final    Special Requests NONE   Final    Gram Stain     Final    Value: WBC PRESENT, PREDOMINANTLY PMN     GRAM POSITIVE COCCI IN PAIRS   Culture     Final    Value: ABUNDANT METHICILLIN RESISTANT STAPHYLOCOCCUS AUREUS     Note: RIFAMPIN AND GENTAMICIN SHOULD NOT BE USED AS SINGLE DRUGS FOR TREATMENT OF STAPH INFECTIONS. This organism DOES NOT demonstrate inducible Clindamycin resistance in vitro.   Report Status 07/04/2012 FINAL   Final    Organism ID, Bacteria METHICILLIN RESISTANT STAPHYLOCOCCUS AUREUS   Final   CULTURE, ROUTINE-ABSCESS     Status: Normal   Collection Time   07/02/12  3:21 PM    Component Value Range Status Comment   Specimen Description DRAINAGE   Final    Special Requests Normal   Final    Gram Stain     Final    Value: ABUNDANT WBC PRESENT, PREDOMINANTLY PMN     NO SQUAMOUS EPITHELIAL CELLS SEEN     FEW GRAM POSITIVE COCCI IN CLUSTERS     CRITICAL RESULT CALLED TO, READ BACK BY AND VERIFIED WITH: CRITICAL RESULT CALLED TO, READ BACK BY AND VERIFIED WITH: PATTY CORBITT 6:45PM 07/02/12 BY RUSCA.   Culture     Final    Value: ABUNDANT METHICILLIN RESISTANT STAPHYLOCOCCUS AUREUS     Note: RIFAMPIN AND GENTAMICIN SHOULD NOT BE USED AS SINGLE DRUGS FOR TREATMENT OF STAPH INFECTIONS. This organism DOES NOT demonstrate inducible Clindamycin resistance in vitro. CRITICAL RESULT CALLED TO, READ BACK BY AND VERIFIED WITH: CLARISSA S BY      INGRAM A 7/23 845AM   Report Status 07/05/2012 FINAL   Final    Organism ID, Bacteria METHICILLIN RESISTANT STAPHYLOCOCCUS AUREUS   Final   ANAEROBIC CULTURE     Status: Normal   Collection Time   07/02/12  3:21 PM      Component Value Range Status Comment   Specimen Description OTHER ABSCESS DRAINAGE PERITONEAL   Final    Special Requests Normal   Final  Gram Stain     Final    Value: ABUNDANT WBC PRESENT, PREDOMINANTLY PMN     NO SQUAMOUS EPITHELIAL CELLS SEEN     FEW GRAM POSITIVE COCCI IN CLUSTERS   Culture NO ANAEROBES ISOLATED   Final    Report Status 07/07/2012 FINAL   Final   SURGICAL PCR SCREEN     Status: Normal   Collection Time   07/15/12  8:31 AM      Component Value Range Status Comment   MRSA, PCR NEGATIVE  NEGATIVE Final    Staphylococcus aureus NEGATIVE  NEGATIVE Final     Medical History: Past Medical History  Diagnosis Date  . Cirrhosis of liver   . Hepatitis C 03-28-12    ? 80's-prev. IV drug abuse  . 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 Blood transfusion 03-28-12    '81-hx. of bleeding ulcer  . History of bleeding ulcers 03-28-12    Hx. '81  . GERD (gastroesophageal reflux  disease) 03-26-12    Hx. reflux. ulcers in past, occ. OTC med  . Incisional hernia     has drainage system from abdomin    Medications:  Prescriptions prior to admission  Medication Sig Dispense Refill  . bumetanide (BUMEX) 2 MG tablet Take 2 mg by mouth daily.      Marland Kitchen doxycycline (VIBRA-TABS) 100 MG tablet Take 100 mg by mouth 2 (two) times daily. Starting 07/10/12 for 10 days      . HYDROmorphone (DILAUDID) 4 MG tablet Take 4 mg by mouth every 4 (four) hours as needed. pain      . lactulose (CHRONULAC) 10 GM/15ML solution Take 30 mLs (20 g total) by mouth daily.  240 mL  1  . Multiple Vitamin (MULITIVITAMIN WITH MINERALS) TABS Take 1 tablet by mouth daily.      Marland Kitchen omeprazole (PRILOSEC) 20 MG capsule Take 20 mg by mouth daily.      . ondansetron (ZOFRAN) 4 MG tablet Take 4 mg by mouth every 8 (eight) hours as needed. For nausea      . spironolactone (ALDACTONE) 100 MG tablet Take 100 mg by mouth daily before breakfast.       . naproxen sodium (ANAPROX) 220 MG tablet Take 220 mg by mouth 2 (two) times daily with a meal.       Assessment: Noah Medina with cirrhosis and previous laproscopic incisional hernia repair with mesh earlier this year.  Developed cellulitis of abdominal wall several weeks ago leading to infected seroma with drain placed. S/p OR (8/7) for excision of mesh, hernia repair and insertion of new mesh.  Currently on D#5 Vancomycin 1000mg  IV q12h. Reported trough was therapeutic at 11.9 mcg/ml   Goal of Therapy:  Vancomycin trough level 10-15 mcg/ml  Plan:  - Continue Vancomycin 1000 mg IV q12h  - Follow up SCr, UOP, cultures, clinical course and adjust as clinically indicated.  Franchot Erichsen 07/24/2012,8:21 AM

## 2012-07-24 NOTE — Progress Notes (Signed)
4 Days Post-Op  Subjective: Feels good, tolerating diet, passing lots of gas, and no bowel movement yet. Would like to restart his lactulose.  Objective: Vital signs in last 24 hours: Temp:  [97.5 F (36.4 C)-98.2 F (36.8 C)] 97.5 F (36.4 C) (08/11 0606) Pulse Rate:  [80-86] 86  (08/11 0606) Resp:  [18] 18  (08/11 0606) BP: (111-115)/(53-57) 111/53 mmHg (08/11 0606) SpO2:  [100 %] 100 % (08/11 0606)   Intake/Output from previous day: 08/10 0701 - 08/11 0700 In: 1656 [P.O.:480; I.V.:1176] Out: 670 [Urine:500; Drains:170] Intake/Output this shift: Total I/O In: -  Out: 50 [Drains:50]   Abdomen: Soft and nontender but distended. No rebound. Bowel sounds are active. Distention is about the same as yesterday. It is not tight. The JP drainage is fairly thin.  Incision: no significant drainage, no significant erythema, Mild induration along the lower half of the wound  Lab Results:   [facility] 07/24/12 0702 07/23/12 0535  WBC 4.6 5.5  HGB 8.8* 8.7*  HCT 25.7* 25.1*  PLT 105* 95*   BMET  Basename 07/24/12 0702 07/22/12 0625  NA 131* 132*  K 4.3 3.6  CL 101 105  CO2 24 23  GLUCOSE 99 121*  BUN 8 9  CREATININE 0.64 0.68  CALCIUM 8.1* 7.4*   PT/INR  Basename 07/22/12 0625  LABPROT 20.9*  INR 1.77*   ABG No results found for this basename: PHART:2,PCO2:2,PO2:2,HCO3:2 in the last 72 hours  MEDS, Scheduled    . pantoprazole  40 mg Oral Q1200  . spironolactone  100 mg Oral Daily  . vancomycin  1,000 mg Intravenous Q12H    Studies/Results: No results found.  Assessment: s/p Procedure(s): EXCISION OF MESH HERNIA REPAIR INCISIONAL INSERTION OF MESH Patient Active Problem List  Diagnosis  . Cirrhosis  . Seroma, postoperative, possible abscess  . Cirrhosis of liver with history of hepatitis C  . Status post repair of ventral hernia  . Hypokalemia  . Severe malnutrition  . Ascites  . Normocytic anemia  . Hyponatremia  . MRSA (methicillin resistant  staph aureus) culture positive  . Infected seroma, postoperative    Improving and should be able to go home tomorrow.  Plan: Plan for discharge tomorrow Will restart his lactulose   LOS: 4 days     Currie Paris, MD, [facility], [place] [phone]   07/24/2012 10:17 AM

## 2012-07-25 MED ORDER — LACTULOSE 10 GM/15ML PO SOLN: 20.0000 g | Freq: Every day | ORAL | Status: AC

## 2012-07-25 MED ORDER — DOXYCYCLINE HYCLATE 100 MG PO TABS: 100.0000 mg | ORAL_TABLET | Freq: Two times a day (BID) | ORAL | Status: AC

## 2012-07-25 MED ORDER — HYDROMORPHONE HCL 4 MG PO TABS
4.0000 mg | ORAL_TABLET | ORAL | Status: AC | PRN
Start: 2012-07-25 — End: 2012-08-04

## 2012-07-25 NOTE — Progress Notes (Signed)
Patient discharged to home with son.  Discharge instructions completed including follow up care, medications and signs and symptoms of infections.  JP care instructions completed, patient demonstrated emptying and recording of JP drainage.  Verbalizes understanding with no further questions.  Vital signs stable.  Discharged per wheelchair.

## 2012-07-25 NOTE — Progress Notes (Signed)
Patient ID: Noah Medina, male   DOB: 26-Dec-1955, 56 y.o.   MRN: [id number] Doing much better Wants to go home Pain controlled  Abdomen soft Drain serosang  Plan:  Discharge home

## 2012-07-25 NOTE — Discharge Summary (Signed)
Physician Discharge Summary  Patient ID: Noah Medina MRN: [id number] DOB/AGE: April 13, 1956 56 y.o.  Admit date: 07/20/2012 Discharge date: 07/25/2012  Admission Diagnoses: infected seroma and mesh        cirrhosis Discharge Diagnoses: same Active Problems:  * No active hospital problems. *    Discharged Condition: fair  Hospital Course: admitted for elective removal of mesh secondary to infection.  Needed transfusions of FFP and PRBC post op secondary to blood loss anemia and coagulopathy from cirrhosis.  Kept on IV antibiotics post op.  Did well. Discharged home.  Consults: None  Significant Diagnostic Studies:   Treatments: surgery: removal infected mesh with placement of biologic mesh  Discharge Exam: Blood pressure 119/56, pulse 79, temperature 98.1 F (36.7 C), temperature source Oral, resp. rate 18, height 5\' 11"  (1.803 m), weight 175 lb 4.8 oz (79.516 kg), SpO2 100.00%. Comfortable, no distress Abdomin soft with staples and drain in place Minimal erythema Drain serosang  Disposition: 01-Home or Self Care  Discharge Orders    Future Appointments: Provider: Department: Dept Phone: Center:   08/02/2012 9:10 AM Shelly Rubenstein, MD [facility] [phone] None     Future Orders Please Complete By Expires   Discharge patient        Medication List  As of 07/25/2012  8:12 AM   STOP taking these medications         lactulose 10 GM/15ML solution         TAKE these medications         bumetanide 2 MG tablet   Commonly known as: BUMEX   Take 2 mg by mouth daily.      doxycycline 100 MG tablet   Commonly known as: VIBRA-TABS   Take 1 tablet (100 mg total) by mouth 2 (two) times daily.      doxycycline 100 MG tablet   Commonly known as: VIBRA-TABS   Take 100 mg by mouth 2 (two) times daily. Starting 07/10/12 for 10 days      HYDROmorphone 4 MG tablet   Commonly known as: DILAUDID   Take 1 tablet (4 mg total) by mouth every 4 (four) hours as needed.      HYDROmorphone 4 MG tablet   Commonly known as: DILAUDID   Take 4 mg by mouth every 4 (four) hours as needed. pain      multivitamin with minerals Tabs   Take 1 tablet by mouth daily.      naproxen sodium 220 MG tablet   Commonly known as: ANAPROX   Take 220 mg by mouth 2 (two) times daily with a meal.      omeprazole 20 MG capsule   Commonly known as: PRILOSEC   Take 20 mg by mouth daily.      ondansetron 4 MG tablet   Commonly known as: ZOFRAN   Take 4 mg by mouth every 8 (eight) hours as needed. For nausea      spironolactone 100 MG tablet   Commonly known as: ALDACTONE   Take 100 mg by mouth daily before breakfast.           Follow-up Information    Follow up with Abigail Miyamoto A, MD. Call on 08/02/2012.   Contact information:   [address] [place] [address] [phone]          Signed: Shelly Rubenstein 07/25/2012, 8:12 AM

## 2012-08-02 ENCOUNTER — Ambulatory Visit (INDEPENDENT_AMBULATORY_CARE_PROVIDER_SITE_OTHER): Payer: Medicaid Other | Admitting: Surgery

## 2012-08-02 ENCOUNTER — Encounter (INDEPENDENT_AMBULATORY_CARE_PROVIDER_SITE_OTHER): Payer: Self-pay | Admitting: Surgery

## 2012-08-02 VITALS — BP 126/89 | HR 76 | Temp 98.6°F | Resp 20 | Ht 71.0 in | Wt 168.2 lb

## 2012-08-02 DIAGNOSIS — Z09 Encounter for follow-up examination after completed treatment for conditions other than malignant neoplasm: Secondary | ICD-10-CM

## 2012-08-02 NOTE — Progress Notes (Signed)
Subjective:     Patient ID: Noah Medina, male   DOB: 06/06/1956, 56 y.o.   MRN: [id number]  HPI He is here for his first postop visit status post removal of chronically infected mesh and a chronic seroma with placement of bilateral mesh. He is doing fairly well. His drain is draining minimal fluid.  Review of Systems     Objective:   Physical Exam On exam, his midline incision appears well healed. I removed his staples and place Steri-Strips. I also removed his drain. There is no erythema of the incision.    Assessment:     Patient stable postop    Plan:     He will continue to refrain from heavy lifting. I renewed his Dilaudid. I will see him back in 2 weeks.

## 2012-08-09 ENCOUNTER — Telehealth (INDEPENDENT_AMBULATORY_CARE_PROVIDER_SITE_OTHER): Payer: Self-pay

## 2012-08-09 NOTE — Telephone Encounter (Signed)
Pt calling requesting refill on Dilaudid 4mg . The pt states he is still having a lot of pain from his surgery and still taking the pain medication everyday. Pls call pt.

## 2012-08-09 NOTE — Telephone Encounter (Signed)
Called pt and told him that we have a Rx for Dilaudid 4mg  1 po q 6 hrs prn for pain #60 with out refills written by Dr Magnus Ivan

## 2012-08-17 ENCOUNTER — Telehealth (INDEPENDENT_AMBULATORY_CARE_PROVIDER_SITE_OTHER): Payer: Self-pay | Admitting: General Surgery

## 2012-08-17 NOTE — Telephone Encounter (Signed)
Patient called in requesting additional pain medication. Advised him that Dilaudid was just issued last week. Patient stated he is out of it and still in pain. Advised his request would be forwarded to Dr. Magnus Ivan. Patient agreed, call back # [phone].  Pt had excision of mesh on 07/20/12.

## 2012-08-18 ENCOUNTER — Ambulatory Visit (INDEPENDENT_AMBULATORY_CARE_PROVIDER_SITE_OTHER): Payer: Medicaid Other | Admitting: Surgery

## 2012-08-18 ENCOUNTER — Encounter (INDEPENDENT_AMBULATORY_CARE_PROVIDER_SITE_OTHER): Payer: Self-pay | Admitting: Surgery

## 2012-08-18 VITALS — BP 123/77 | HR 82 | Temp 98.2°F | Resp 18 | Ht 71.0 in | Wt 163.0 lb

## 2012-08-18 DIAGNOSIS — Z09 Encounter for follow-up examination after completed treatment for conditions other than malignant neoplasm: Secondary | ICD-10-CM

## 2012-08-18 NOTE — Progress Notes (Signed)
Subjective:     Patient ID: Noah Medina, male   DOB: 1956/10/15, 56 y.o.   MRN: [id number]  HPI He is here for another postoperative visit. He is still having abdominal wall discomfort which is to be expected  Review of Systems     Objective:   Physical Exam His abdomen is soft. His incision is healing well.    Assessment:     Stable postop    Plan:     He will still refrain from heavy lifting. I'm going to try to limit his narcotic use. He will still followup with his medical physicians regarding his liver disease. I will see him back in one month. I want to try him on tramadol and stop the Dilaudid at his request

## 2012-09-20 ENCOUNTER — Encounter (INDEPENDENT_AMBULATORY_CARE_PROVIDER_SITE_OTHER): Payer: Medicaid Other | Admitting: Surgery

## 2012-10-03 ENCOUNTER — Encounter (INDEPENDENT_AMBULATORY_CARE_PROVIDER_SITE_OTHER): Payer: Self-pay | Admitting: Surgery

## 2012-10-03 ENCOUNTER — Ambulatory Visit (INDEPENDENT_AMBULATORY_CARE_PROVIDER_SITE_OTHER): Payer: Medicaid Other | Admitting: Surgery

## 2012-10-03 VITALS — BP 146/78 | HR 88 | Temp 98.6°F | Resp 20 | Ht 71.5 in | Wt 184.4 lb

## 2012-10-03 DIAGNOSIS — Z09 Encounter for follow-up examination after completed treatment for conditions other than malignant neoplasm: Secondary | ICD-10-CM

## 2012-10-03 NOTE — Progress Notes (Signed)
Subjective:     Patient ID: Noah Medina, male   DOB: 08-Feb-1956, 56 y.o.   MRN: [id number]  HPI He is here for a followup visit. He was out of town several weeks ago and developed abdominal pain. He had to be admitted to the hospital and placed on IV antibiotics. He was finally discharged home and is coming for followup. He still has intermittent right lower quadrant abdominal pain. He has had no nausea or vomiting and is moving his bowels well.  Review of Systems     Objective:   Physical Exam On exam, his incision is well-healed. There is a fluid wave of ascites. There is firmness with minimal tenderness in the right lower quadrant. There is no erythema of the abdominal wall    Assessment:     Chronic abdominal pain    Plan:     He will continue have this chronic discomfort given his complex abdominal situation. I will  Renew the dilaudid.  I will see him back in approximately 2-3 weeks

## 2012-10-18 ENCOUNTER — Encounter (INDEPENDENT_AMBULATORY_CARE_PROVIDER_SITE_OTHER): Payer: Self-pay | Admitting: Surgery

## 2012-10-18 ENCOUNTER — Ambulatory Visit (INDEPENDENT_AMBULATORY_CARE_PROVIDER_SITE_OTHER): Payer: Medicaid Other | Admitting: Surgery

## 2012-10-18 ENCOUNTER — Telehealth (INDEPENDENT_AMBULATORY_CARE_PROVIDER_SITE_OTHER): Payer: Self-pay | Admitting: General Surgery

## 2012-10-18 VITALS — BP 125/77 | HR 88 | Temp 99.6°F | Resp 14 | Ht 71.5 in | Wt 185.2 lb

## 2012-10-18 DIAGNOSIS — K432 Incisional hernia without obstruction or gangrene: Secondary | ICD-10-CM

## 2012-10-18 NOTE — Telephone Encounter (Signed)
Faxed over info to [facility] Referral. Dr Magnus Ivan wants pt to see Dr Timothy Lasso, the office will call and ask for Pattricia Boss, last couple office notes was faxed over and pt is aware of all of this when he was being seen today in clinic. Phone number to the [facility] is [phone] and fax is [phone]

## 2012-10-18 NOTE — Progress Notes (Signed)
Subjective:     Patient ID: Noah Medina, male   DOB: 10/20/1956, 56 y.o.   MRN: [id number]  HPI He is here for another followup visit. He still has chronic abdominal pain but is improving. He is constipated but has no nausea or vomiting. He walks to stop the dilaudid because of his constipation.  He has no fevers or chills  Review of Systems     Objective:   Physical Exam On exam, there is a recurrent incisional hernia. He does not appear to have any ascites today. The hernia is nontender    Assessment:     Patient with cirrhosis and recurrent hernia after multiple attempt at repair    Plan:     I'm going to try him on oxycodone. I will see him back in one month. We will refer him to Dr. Timothy Lasso for hepatologic evaluation

## 2012-11-15 ENCOUNTER — Telehealth (INDEPENDENT_AMBULATORY_CARE_PROVIDER_SITE_OTHER): Payer: Self-pay | Admitting: General Surgery

## 2012-11-15 ENCOUNTER — Ambulatory Visit (INDEPENDENT_AMBULATORY_CARE_PROVIDER_SITE_OTHER): Payer: Medicaid Other | Admitting: Surgery

## 2012-11-15 VITALS — BP 136/70 | HR 86 | Temp 98.5°F | Ht 71.0 in | Wt 192.0 lb

## 2012-11-15 DIAGNOSIS — K432 Incisional hernia without obstruction or gangrene: Secondary | ICD-10-CM

## 2012-11-15 NOTE — Progress Notes (Signed)
Subjective:     Patient ID: Noah Medina, male   DOB: 1956/03/20, 56 y.o.   MRN: [id number]  HPI He is here for another visit. His bowel movements have improved, but he is not getting enough relief from his chronic pain with OxyContin. He has no nausea or vomiting  Review of Systems     Objective:   Physical Exam His recurrent hernia is nontender. There is no ascites. He looks well today    Assessment:     Patient with history of cirrhosis and recurrent ventral hernia, history of infected mesh    Plan:     We will get him an appointment with Dr. Timothy Lasso. I will restart him on dilaudid.  I will see him back in one month

## 2012-11-15 NOTE — Telephone Encounter (Signed)
Patient came in for normal appt today with Dr.Blackman and stated that he still has not heard from Dr.Russo's office for an appt..I then called and spoke with Lorica from Dr.Russo's office to see why they had not contacted the patient in which she stated that they needed a copy of his insurance card which I immediately faxed over to Lorica's attn.Marland KitchenMarland KitchenPatient is aware and per Lorica after cleared through insurance company the patient should be hearing from them in the next couple of days

## 2012-12-13 DIAGNOSIS — S065XAA Traumatic subdural hemorrhage with loss of consciousness status unknown, initial encounter: Secondary | ICD-10-CM

## 2012-12-13 DIAGNOSIS — S065X9A Traumatic subdural hemorrhage with loss of consciousness of unspecified duration, initial encounter: Secondary | ICD-10-CM

## 2012-12-13 HISTORY — DX: Traumatic subdural hemorrhage with loss of consciousness status unknown, initial encounter: S06.5XAA

## 2012-12-13 HISTORY — DX: Traumatic subdural hemorrhage with loss of consciousness of unspecified duration, initial encounter: S06.5X9A

## 2012-12-15 ENCOUNTER — Encounter (HOSPITAL_COMMUNITY): Payer: Self-pay | Admitting: Emergency Medicine

## 2012-12-15 ENCOUNTER — Emergency Department (HOSPITAL_COMMUNITY)
Admission: EM | Admit: 2012-12-15 | Discharge: 2012-12-15 | Disposition: A | Discharge status: Home / Self Care | Payer: Medicaid Other | Attending: Emergency Medicine | Admitting: Emergency Medicine

## 2012-12-15 ENCOUNTER — Emergency Department (HOSPITAL_COMMUNITY): Payer: Medicaid Other

## 2012-12-15 DIAGNOSIS — Z8619 Personal history of other infectious and parasitic diseases: Secondary | ICD-10-CM | POA: Insufficient documentation

## 2012-12-15 DIAGNOSIS — W010XXA Fall on same level from slipping, tripping and stumbling without subsequent striking against object, initial encounter: Secondary | ICD-10-CM | POA: Insufficient documentation

## 2012-12-15 DIAGNOSIS — Y9301 Activity, walking, marching and hiking: Secondary | ICD-10-CM | POA: Insufficient documentation

## 2012-12-15 DIAGNOSIS — W108XXA Fall (on) (from) other stairs and steps, initial encounter: Secondary | ICD-10-CM | POA: Insufficient documentation

## 2012-12-15 DIAGNOSIS — S20219A Contusion of unspecified front wall of thorax, initial encounter: Secondary | ICD-10-CM | POA: Insufficient documentation

## 2012-12-15 DIAGNOSIS — F17200 Nicotine dependence, unspecified, uncomplicated: Secondary | ICD-10-CM | POA: Insufficient documentation

## 2012-12-15 DIAGNOSIS — I609 Nontraumatic subarachnoid hemorrhage, unspecified: Secondary | ICD-10-CM | POA: Insufficient documentation

## 2012-12-15 DIAGNOSIS — Y929 Unspecified place or not applicable: Secondary | ICD-10-CM | POA: Insufficient documentation

## 2012-12-15 DIAGNOSIS — Z862 Personal history of diseases of the blood and blood-forming organs and certain disorders involving the immune mechanism: Secondary | ICD-10-CM | POA: Insufficient documentation

## 2012-12-15 DIAGNOSIS — S0180XA Unspecified open wound of other part of head, initial encounter: Secondary | ICD-10-CM | POA: Insufficient documentation

## 2012-12-15 DIAGNOSIS — Z8719 Personal history of other diseases of the digestive system: Secondary | ICD-10-CM | POA: Insufficient documentation

## 2012-12-15 DIAGNOSIS — Z79899 Other long term (current) drug therapy: Secondary | ICD-10-CM | POA: Insufficient documentation

## 2012-12-15 DIAGNOSIS — S0003XA Contusion of scalp, initial encounter: Secondary | ICD-10-CM | POA: Insufficient documentation

## 2012-12-15 DIAGNOSIS — W19XXXA Unspecified fall, initial encounter: Secondary | ICD-10-CM

## 2012-12-15 LAB — CBC
HCT: 29.6 % — ABNORMAL LOW (ref 39.0–52.0)
Hemoglobin: 10.3 g/dL — ABNORMAL LOW (ref 13.0–17.0)
MCH: 31.5 pg (ref 26.0–34.0)
MCHC: 34.8 g/dL (ref 30.0–36.0)
RBC: 3.27 MIL/uL — ABNORMAL LOW (ref 4.22–5.81)

## 2012-12-15 LAB — PROTIME-INR: Prothrombin Time: 17 seconds — ABNORMAL HIGH (ref 11.6–15.2)

## 2012-12-15 LAB — BASIC METABOLIC PANEL
BUN: 13 mg/dL (ref 6–23)
CO2: 27 mEq/L (ref 19–32)
GFR calc non Af Amer: >90 mL/min (ref >90)
Glucose, Bld: 102 mg/dL — ABNORMAL HIGH (ref 70–99)
Potassium: 3.5 mEq/L (ref 3.5–5.1)
Sodium: 140 mEq/L (ref 135–145)

## 2012-12-15 MED ORDER — MORPHINE SULFATE 4 MG/ML IJ SOLN
4.0000 mg | Freq: Once | INTRAMUSCULAR | Status: AC
  Administered 2012-12-15: 4 mg via INTRAVENOUS
  Filled 2012-12-15: qty 1

## 2012-12-15 MED ORDER — OXYCODONE-ACETAMINOPHEN 2.5-325 MG PO TABS: 1.0000 | ORAL_TABLET | Freq: Four times a day (QID) | ORAL | Status: DC | PRN

## 2012-12-15 MED ORDER — CYCLOBENZAPRINE HCL 10 MG PO TABS: 10.0000 mg | ORAL_TABLET | Freq: Two times a day (BID) | ORAL | Status: DC | PRN

## 2012-12-15 NOTE — ED Notes (Signed)
Patient transported to X-ray 

## 2012-12-15 NOTE — ED Notes (Signed)
States that he fell down his steps this am and hit his head on the concrete. Laceration noted with controlled bleeding. States that he had been vomiting all night.

## 2012-12-15 NOTE — ED Provider Notes (Signed)
History     CSN: [id number]  Arrival date & time 12/15/12  1244   First MD Initiated Contact with Patient 12/15/12 1349      Chief Complaint  Patient presents with  . Head Laceration  . Rib Injury    (Consider location/radiation/quality/duration/timing/severity/associated sxs/prior treatment) HPI Comments: Noah Medina presents for evaluation after falling 2 days ago.  He describes a mechanical fall after tripping while walking through the door. He struck his head and had some bleeding but was able to get up on his own.  Over the last 24 hours, he reports worsening headache and rib pain.  Patient is a 57 y.o. male presenting with fall. The history is provided by the patient. No language interpreter was used.  Fall The accident occurred 2 days ago. The fall occurred while walking. Distance fallen: from standing. He landed on a hard floor. The volume of blood lost was minimal. The point of impact was the head (left ribs). The pain is present in the head. The pain is at a severity of 8/10. He was ambulatory at the scene. There was no entrapment after the fall. There was no drug use involved in the accident. There was no alcohol use involved in the accident. Associated symptoms include headaches. Pertinent negatives include no visual change, no fever, no numbness, no abdominal pain, no bowel incontinence, no nausea, no vomiting, no hematuria, no hearing loss, no loss of consciousness and no tingling. The symptoms are aggravated by standing and ambulation (deep inspiration). He has tried acetaminophen and NSAIDs for the symptoms.    Past Medical History  Diagnosis Date  . Cirrhosis of liver   . Hepatitis C 03-28-12    ? 80's-prev. IV drug abuse  . 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 Blood transfusion 03-28-12    '81-hx. of bleeding ulcer  . History of bleeding ulcers 03-28-12    Hx. '81  . GERD (gastroesophageal reflux disease) 03-26-12    Hx. reflux. ulcers in  past, occ. OTC med  . Incisional hernia     has drainage system from abdomin    Past Surgical History  Procedure Date  . Umbilical hernia repair   . Hernia repair 08/24/11    ventral hernia with mesh  . Hernia repair 11'11    Umbilical  . Peptic ulcer 03-26-12    open peptic ulcer surgery repair- '81  . Incisional hernia repair 03/26/2012  . Incisional hernia repair 03/30/2012    Procedure: LAPAROSCOPIC INCISIONAL HERNIA;  Surgeon: Shelly Rubenstein, MD;  Location: [facility];  Service: General;  Laterality: N/A;  Laparoscopic Incisional/ventral Hernia Repair with Mesh  . Tonsillectomy   . Appendectomy   . Incisional hernia repair 07/20/2012    mesh  . Incisional hernia repair 07/20/2012    Procedure: HERNIA REPAIR INCISIONAL;  Surgeon: Shelly Rubenstein, MD;  Location: [facility];  Service: General;  Laterality: N/A;  repair of incisional hernia with Biologic mesh    Family History  Problem Relation Age of Onset  . Cancer Mother     breast  . Cancer Father     brain  . Heart disease Brother   . Cancer Sister     breast  . Cancer Sister     breast    History  Substance Use Topics  . Smoking status: Current Every Day Smoker -- 0.2 packs/day for 38 years    Types: Cigarettes  . Smokeless tobacco: Never Used  . Alcohol Use: No  Comment: past hx. recreational drinking- none in 16 yrs.      Review of Systems  Constitutional: Negative for fever.  Gastrointestinal: Negative for nausea, vomiting, abdominal pain and bowel incontinence.  Genitourinary: Negative for hematuria.  Neurological: Positive for headaches. Negative for tingling, loss of consciousness and numbness.  All other systems reviewed and are negative.    Allergies  Review of patient's allergies indicates no known allergies.  Home Medications   Current Outpatient Rx  Name  Route  Sig  Dispense  Refill  . ADULT MULTIVITAMIN W/MINERALS CH   Oral   Take 1 tablet by mouth daily.         Marland Kitchen NAPROXEN SODIUM  220 MG PO TABS   Oral   Take 220 mg by mouth 2 (two) times daily with a meal.           BP 142/61  Pulse 77  Temp 98.1 F (36.7 C) (Oral)  Resp 19  SpO2 100%  Physical Exam  Nursing note and vitals reviewed. Constitutional: He is oriented to person, place, and time. He appears well-developed and well-nourished. He is active and cooperative.  Non-toxic appearance. He does not have a sickly appearance. He does not appear ill. No distress. He is not intubated.  HENT:  Head: Normocephalic. Head is with abrasion, with contusion and with laceration. Head is without raccoon's eyes, without Battle's sign, without right periorbital erythema and without left periorbital erythema. No trismus in the jaw.    Right Ear: External ear normal. No mastoid tenderness. No hemotympanum.  Left Ear: External ear normal. No mastoid tenderness. No hemotympanum.  Nose: Nose normal. No rhinorrhea. No epistaxis. Right sinus exhibits no maxillary sinus tenderness and no frontal sinus tenderness. Left sinus exhibits no maxillary sinus tenderness and no frontal sinus tenderness.  Mouth/Throat: Uvula is midline, oropharynx is clear and moist and mucous membranes are normal. Mucous membranes are not pale, not dry and not cyanotic. No uvula swelling. No oropharyngeal exudate.  Eyes: Conjunctivae normal, EOM and lids are normal. Pupils are equal, round, and reactive to light. Right eye exhibits no discharge. Left eye exhibits no discharge. No scleral icterus.  Neck: Trachea normal, normal range of motion, full passive range of motion without pain and phonation normal. Neck supple. No JVD present. No spinous process tenderness and no muscular tenderness present. Carotid bruit is not present. No rigidity. No tracheal deviation, no edema, no erythema and normal range of motion present.  Cardiovascular: Normal rate, regular rhythm, S1 normal, S2 normal, normal heart sounds, intact distal pulses and normal pulses.   No  extrasystoles are present. PMI is not displaced.  Exam reveals no gallop, no friction rub and no decreased pulses.   No murmur heard. Pulmonary/Chest: Effort normal and breath sounds normal. No accessory muscle usage or stridor. No apnea and not tachypneic. He is not intubated. No respiratory distress. He has no decreased breath sounds. He has no wheezes. He has no rhonchi. He has no rales. Chest wall is not dull to percussion. He exhibits tenderness and bony tenderness. He exhibits no laceration, no crepitus, no edema, no deformity, no swelling and no retraction. Right breast exhibits no inverted nipple. Left breast exhibits no inverted nipple.    Abdominal: Soft. Normal appearance and bowel sounds are normal. He exhibits no shifting dullness, no distension, no abdominal bruit, no ascites, no pulsatile midline mass and no mass. There is no hepatosplenomegaly. There is no tenderness. There is no rigidity, no rebound, no guarding, no  CVA tenderness, no tenderness at McBurney's point and negative Murphy's sign. No hernia.  Musculoskeletal: Normal range of motion. He exhibits no edema and no tenderness.       Right shoulder: Normal.       Left shoulder: Normal.       Right elbow: Normal.      Left elbow: Normal.       Right hip: Normal.       Left hip: Normal.       Right knee: Normal.       Left knee: Normal.       Cervical back: Normal.       Thoracic back: Normal.       Lumbar back: Normal.  Lymphadenopathy:    He has no cervical adenopathy.  Neurological: He is alert and oriented to person, place, and time. No cranial nerve deficit. He exhibits normal muscle tone. Coordination normal.  Skin: Skin is warm and dry. No rash noted. He is not diaphoretic. No erythema. No pallor.  Psychiatric: He has a normal mood and affect. His behavior is normal.    ED Course  Procedures (including critical care time)  Labs Reviewed - No data to display No results found.   No diagnosis found.    MDM   Pt presents for evaluation of worsening Headache and left rib pain after a fall 2  Days ago.  He appears nontoxic, note stable VS, NAD.  He has bruising of the forehead and a scalp wound that is not currently bleeding.  Ordered CT scan of the head and a CXR.  He is alert and oriented without midline neck tenderness.  1420.  Pt stable, NAD.  He is A&O.  Discussed the head CT with Dr. Azucena Kuba (radiologist).  Pt has a small subarachnoid hemorrhage.  Will consult the on-call neurosurgeon for further recommendations.  1600.  Discussed his imaging and evaluation with Dr. Danielle Dess (neurosurgery).  He is over 48 hours after the fall.  He has no focal deficit and the [facility] is extremely small.  Because the imaging is so remote from the actual accident and there is no large accumulation of blood, Dr. Danielle Dess does not recommend any further imaging or admission.  The CXR demonstrates no fx, effusion, or PTX.  Plan discharge home.  Have advised him to avoid anticoagulants and NSAIDs.        Tobin Chad, MD 12/15/12 [phone]

## 2013-01-03 ENCOUNTER — Other Ambulatory Visit: Payer: Self-pay | Admitting: Internal Medicine

## 2013-01-03 DIAGNOSIS — B1920 Unspecified viral hepatitis C without hepatic coma: Secondary | ICD-10-CM

## 2013-01-06 ENCOUNTER — Other Ambulatory Visit: Payer: Medicaid Other

## 2013-01-11 ENCOUNTER — Ambulatory Visit (INDEPENDENT_AMBULATORY_CARE_PROVIDER_SITE_OTHER): Payer: Medicaid Other | Admitting: Surgery

## 2013-01-11 ENCOUNTER — Encounter (INDEPENDENT_AMBULATORY_CARE_PROVIDER_SITE_OTHER): Payer: Self-pay | Admitting: Surgery

## 2013-01-11 VITALS — BP 144/76 | HR 80 | Temp 97.8°F | Resp 18 | Ht 71.0 in | Wt 198.0 lb

## 2013-01-11 DIAGNOSIS — K432 Incisional hernia without obstruction or gangrene: Secondary | ICD-10-CM

## 2013-01-11 NOTE — Progress Notes (Signed)
Subjective:     Patient ID: Noah Medina, male   DOB: August 28, 1956, 57 y.o.   MRN: [id number]  HPI He is here for another visit regarding his chronic recurrent ventral incisional hernia. He continues to have mild discomfort but no obstructive symptoms. He fell earlier this month at home and get a small subarachnoid hemorrhage but has fully recovered from that. He has no nausea or vomiting. 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 He is a poor candidate for repair at this point. I will see him back in 2 months

## 2013-01-19 ENCOUNTER — Other Ambulatory Visit: Payer: Medicaid Other

## 2013-01-24 ENCOUNTER — Other Ambulatory Visit: Payer: Medicaid Other

## 2013-02-15 ENCOUNTER — Telehealth (INDEPENDENT_AMBULATORY_CARE_PROVIDER_SITE_OTHER): Payer: Self-pay | Admitting: General Surgery

## 2013-02-15 NOTE — Telephone Encounter (Signed)
Called patient to come to the office to pick up his Rx for Dilaudid 4 mg.

## 2013-02-15 NOTE — Telephone Encounter (Signed)
Pt called to ask Dr. Magnus Ivan for another refill of his Hydromorphone.  Explained this is a prescription he will need to pick up and he understands.  Also, Dr. Magnus Ivan will not be in the office until mid-afternoon, so we will call him later.

## 2013-02-27 ENCOUNTER — Encounter (INDEPENDENT_AMBULATORY_CARE_PROVIDER_SITE_OTHER): Payer: Medicaid Other | Admitting: Surgery

## 2013-03-06 ENCOUNTER — Encounter (INDEPENDENT_AMBULATORY_CARE_PROVIDER_SITE_OTHER): Payer: Medicaid Other | Admitting: Surgery

## 2013-03-13 ENCOUNTER — Encounter (INDEPENDENT_AMBULATORY_CARE_PROVIDER_SITE_OTHER): Payer: Self-pay | Admitting: Surgery

## 2013-03-14 ENCOUNTER — Encounter (INDEPENDENT_AMBULATORY_CARE_PROVIDER_SITE_OTHER): Payer: Self-pay | Admitting: Surgery

## 2013-03-14 ENCOUNTER — Ambulatory Visit (INDEPENDENT_AMBULATORY_CARE_PROVIDER_SITE_OTHER): Payer: Medicaid Other | Admitting: Surgery

## 2013-03-14 VITALS — BP 134/86 | HR 88 | Temp 99.1°F | Resp 18 | Ht 71.0 in | Wt 201.8 lb

## 2013-03-14 DIAGNOSIS — K432 Incisional hernia without obstruction or gangrene: Secondary | ICD-10-CM

## 2013-03-14 NOTE — Progress Notes (Signed)
Subjective:     Patient ID: Noah Medina, male   DOB: Apr 20, 1956, 57 y.o.   MRN: [id number]  HPI He is actually continuing to improve. He still has abdominal discomfort and a large recurrent ventral hernia but no obstructive symptoms.  Review of Systems     Objective:   Physical Exam On exam, the hernia includes most of the abdominal wall. There is no erythema. There is mild tenderness.    Assessment:     Chronic recurrent incisional hernia with history of cirrhosis     Plan:     We will continue to follow this conservatively as I do not believe he is an operative candidate. I will see him back later this year

## 2013-04-13 ENCOUNTER — Telehealth (INDEPENDENT_AMBULATORY_CARE_PROVIDER_SITE_OTHER): Payer: Self-pay | Admitting: *Deleted

## 2013-04-13 NOTE — Telephone Encounter (Signed)
Magnus Ivan MD came over and wrote prescription for Dilaudid 4mg  1 tablet every 6 hours as needed for pain #80 no refills.  Patient updated and will come to pick it up.

## 2013-04-13 NOTE — Telephone Encounter (Signed)
Patient called to ask for a refill of his Dilaudid.  Spoke to [place] MD who states he will come by the office later to write the prescription.  Patient updated at this time and agreeable.

## 2013-04-23 ENCOUNTER — Encounter (HOSPITAL_COMMUNITY): Payer: Self-pay | Admitting: *Deleted

## 2013-04-23 ENCOUNTER — Emergency Department (HOSPITAL_COMMUNITY): Payer: Medicaid Other

## 2013-04-23 ENCOUNTER — Inpatient Hospital Stay (HOSPITAL_COMMUNITY)
Admission: EM | Admit: 2013-04-23 | Discharge: 2013-04-25 | DRG: 085 | Disposition: A | Discharge status: Home Health | Payer: Medicaid Other | Attending: Internal Medicine | Admitting: Internal Medicine

## 2013-04-23 DIAGNOSIS — D696 Thrombocytopenia, unspecified: Secondary | ICD-10-CM | POA: Diagnosis present

## 2013-04-23 DIAGNOSIS — E871 Hypo-osmolality and hyponatremia: Secondary | ICD-10-CM

## 2013-04-23 DIAGNOSIS — D649 Anemia, unspecified: Secondary | ICD-10-CM | POA: Diagnosis present

## 2013-04-23 DIAGNOSIS — Z9181 History of falling: Secondary | ICD-10-CM

## 2013-04-23 DIAGNOSIS — F10939 Alcohol use, unspecified with withdrawal, unspecified: Secondary | ICD-10-CM | POA: Diagnosis present

## 2013-04-23 DIAGNOSIS — Z8719 Personal history of other diseases of the digestive system: Secondary | ICD-10-CM

## 2013-04-23 DIAGNOSIS — F10239 Alcohol dependence with withdrawal, unspecified: Secondary | ICD-10-CM | POA: Diagnosis present

## 2013-04-23 DIAGNOSIS — E876 Hypokalemia: Secondary | ICD-10-CM | POA: Diagnosis present

## 2013-04-23 DIAGNOSIS — B1920 Unspecified viral hepatitis C without hepatic coma: Secondary | ICD-10-CM | POA: Diagnosis present

## 2013-04-23 DIAGNOSIS — K7460 Unspecified cirrhosis of liver: Secondary | ICD-10-CM | POA: Diagnosis present

## 2013-04-23 DIAGNOSIS — R188 Other ascites: Secondary | ICD-10-CM

## 2013-04-23 DIAGNOSIS — R269 Unspecified abnormalities of gait and mobility: Secondary | ICD-10-CM | POA: Diagnosis present

## 2013-04-23 DIAGNOSIS — F1010 Alcohol abuse, uncomplicated: Secondary | ICD-10-CM | POA: Diagnosis present

## 2013-04-23 DIAGNOSIS — Z22322 Carrier or suspected carrier of Methicillin resistant Staphylococcus aureus: Secondary | ICD-10-CM

## 2013-04-23 DIAGNOSIS — W19XXXA Unspecified fall, initial encounter: Secondary | ICD-10-CM

## 2013-04-23 DIAGNOSIS — S065XAA Traumatic subdural hemorrhage with loss of consciousness status unknown, initial encounter: Principal | ICD-10-CM | POA: Diagnosis present

## 2013-04-23 DIAGNOSIS — F1410 Cocaine abuse, uncomplicated: Secondary | ICD-10-CM | POA: Diagnosis present

## 2013-04-23 DIAGNOSIS — E43 Unspecified severe protein-calorie malnutrition: Secondary | ICD-10-CM | POA: Diagnosis present

## 2013-04-23 DIAGNOSIS — Z6828 Body mass index (BMI) 28.0-28.9, adult: Secondary | ICD-10-CM

## 2013-04-23 DIAGNOSIS — F1020 Alcohol dependence, uncomplicated: Secondary | ICD-10-CM | POA: Diagnosis present

## 2013-04-23 DIAGNOSIS — S065X9A Traumatic subdural hemorrhage with loss of consciousness of unspecified duration, initial encounter: Secondary | ICD-10-CM | POA: Diagnosis present

## 2013-04-23 LAB — CBC WITH DIFFERENTIAL/PLATELET
Eosinophils Absolute: 0.3 10*3/uL (ref 0.0–0.7)
MCH: 32.4 pg (ref 26.0–34.0)
MCHC: 35.5 g/dL (ref 30.0–36.0)
Monocytes Absolute: 0.6 10*3/uL (ref 0.1–1.0)
Neutrophils Relative %: 58 % (ref 43–77)
Platelets: 49 10*3/uL — ABNORMAL LOW (ref 150–400)

## 2013-04-23 NOTE — ED Provider Notes (Signed)
History     CSN: [id number]  Arrival date & time 04/23/13  2201   First MD Initiated Contact with Patient 04/23/13 2242      Chief Complaint  Patient presents with  . Dizziness  . Gait Problem    (Consider location/radiation/quality/duration/timing/severity/associated sxs/prior treatment) HPI Comments: Patient reports dizziness with position change that lasts for several seconds than resolves, denies nausea, URI symptoms.  Also states that since arrival in the ED his hernia has re ruptured, is painful but easily reduced  Has family Hx of brian tumors resulting in death   The history is provided by the patient.    Past Medical History  Diagnosis Date  . Cirrhosis of liver   . Hepatitis C 03-28-12    ? 80's-prev. IV drug abuse  . 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 Blood transfusion 03-28-12    '81-hx. of bleeding ulcer  . History of bleeding ulcers 03-28-12    Hx. '81  . GERD (gastroesophageal reflux disease) 03-26-12    Hx. reflux. ulcers in past, occ. OTC med  . Incisional hernia     has drainage system from abdomin    Past Surgical History  Procedure Laterality Date  . Umbilical hernia repair    . Hernia repair  08/24/11    ventral hernia with mesh  . Hernia repair  11'11    Umbilical  . Peptic ulcer  03-26-12    open peptic ulcer surgery repair- '81  . Incisional hernia repair  03/26/2012  . Incisional hernia repair  03/30/2012    Procedure: LAPAROSCOPIC INCISIONAL HERNIA;  Surgeon: Shelly Rubenstein, MD;  Location: [facility];  Service: General;  Laterality: N/A;  Laparoscopic Incisional/ventral Hernia Repair with Mesh  . Tonsillectomy    . Appendectomy    . Incisional hernia repair  07/20/2012    mesh  . Incisional hernia repair  07/20/2012    Procedure: HERNIA REPAIR INCISIONAL;  Surgeon: Shelly Rubenstein, MD;  Location: [facility];  Service: General;  Laterality: N/A;  repair of incisional hernia with Biologic mesh    Family History   Problem Relation Age of Onset  . Cancer Mother     breast  . Cancer Father     brain  . Heart disease Brother   . Cancer Sister     breast  . Cancer Sister     breast    History  Substance Use Topics  . Smoking status: Current Every Day Smoker -- 0.25 packs/day for 38 years    Types: Cigarettes  . Smokeless tobacco: Never Used     Comment: down to 2 cigarettes per day  . Alcohol Use: No     Comment: past hx. recreational drinking- none in 16 yrs.      Review of Systems  Constitutional: Negative for fever and chills.  HENT: Negative for ear pain, congestion, rhinorrhea, neck pain, sinus pressure and ear discharge.   Gastrointestinal: Positive for abdominal pain. Negative for nausea, vomiting, diarrhea, constipation and rectal pain.  Skin: Negative for pallor and rash.  Neurological: Positive for dizziness. Negative for weakness and headaches.  All other systems reviewed and are negative.    Allergies  Review of patient's allergies indicates no known allergies.  Home Medications   Current Outpatient Rx  Name  Route  Sig  Dispense  Refill  . HYDROmorphone (DILAUDID) 4 MG tablet   Oral   Take 4 mg by mouth every 6 (six) hours as needed for  pain.         . Multiple Vitamin (MULITIVITAMIN WITH MINERALS) TABS   Oral   Take 1 tablet by mouth daily.         Marland Kitchen omeprazole (PRILOSEC OTC) 20 MG tablet   Oral   Take 20 mg by mouth daily.           BP 132/86  Pulse 69  Temp(Src) 97.9 F (36.6 C) (Oral)  Resp 14  Ht 5\' 11"  (1.803 m)  Wt 205 lb (92.987 kg)  BMI 28.6 kg/m2  SpO2 99%  Physical Exam  Nursing note and vitals reviewed. Constitutional: He appears well-developed and well-nourished. No distress.  HENT:  Head: Normocephalic and atraumatic.  Right Ear: External ear normal.  Left Ear: External ear normal.  Mouth/Throat: Oropharynx is clear and moist.  Eyes: EOM are normal. Pupils are equal, round, and reactive to light. Right eye exhibits no  nystagmus. Left eye exhibits no nystagmus.  Neck: Normal range of motion.  Cardiovascular: Normal rate.   Pulmonary/Chest: Effort normal and breath sounds normal.  Abdominal: Soft. A hernia is present. Hernia confirmed positive in the ventral area.  Large previously surgically corrected ventral hernia     ED Course  Procedures (including critical care time)  Labs Reviewed  CBC WITH DIFFERENTIAL - Abnormal; Notable for the following:    RBC 3.77 (*)    Hemoglobin 12.2 (*)    HCT 34.4 (*)    RDW 16.1 (*)    Platelets 49 (*)    All other components within normal limits  URINALYSIS, ROUTINE W REFLEX MICROSCOPIC - Abnormal; Notable for the following:    Hgb urine dipstick TRACE (*)    All other components within normal limits  CBC WITH DIFFERENTIAL - Abnormal; Notable for the following:    WBC 3.9 (*)    RBC 3.53 (*)    Hemoglobin 11.4 (*)    HCT 32.2 (*)    RDW 16.3 (*)    Platelets 43 (*)    Eosinophils Relative 6 (*)    All other components within normal limits  PROTIME-INR - Abnormal; Notable for the following:    Prothrombin Time 16.9 (*)    All other components within normal limits  COMPREHENSIVE METABOLIC PANEL - Abnormal; Notable for the following:    Potassium 3.3 (*)    Albumin 2.8 (*)    AST 65 (*)    Total Bilirubin 1.7 (*)    GFR calc non Af Amer 81 (*)    All other components within normal limits  URINE MICROSCOPIC-ADD ON   Ct Head Wo Contrast  04/23/2013  *RADIOLOGY REPORT*  Clinical Data: Dizziness  CT HEAD WITHOUT CONTRAST  Technique:  Contiguous axial images were obtained from the base of the skull through the vertex without contrast.  Comparison: 12/15/2012  Findings: Prominence of the sulci, cisterns, and ventricles, in keeping with diffuse cerebral volume loss.  There is an extra-axial fluid collection along the left cerebral convexity measuring up to 7 mm in thickness and isointense to brain parenchyma.  On the right, there is a 3 mm thickness extra-axial  fluid collection isodense to slightly hyperdense attenuation along the right frontal convexity as seen on series 2 image 16. Mild associated mass effect however due to the volume loss, does not appear to be significant. No hydrocephalous.  No overt CT evidence for acute infarct.  No intraparenchymal hemorrhage. The visualized paranasal sinuses and mastoid air cells are predominately clear. Mild left posterolateral scalp swelling  may reflect sequelae of prior hematoma and is not favored to be acute.  No displaced calvarial fracture.  IMPRESSION: Bilateral extra-axial fluid collections as above are favored to reflect subacute left and acute on subacute right subdural hematomas.  Critical Value/emergent results were called by telephone at the time of interpretation on 04/23/2013 at 11:20 p.m. to Sharen Hones, who verbally acknowledged these results.   Original Report Authenticated By: Jearld Lesch, M.D.    Dg Abd Acute W/chest  04/23/2013  *RADIOLOGY REPORT*  Clinical Data: Abdominal pain, history of ventral hernia  ACUTE ABDOMEN SERIES (ABDOMEN 2 VIEW & CHEST 1 VIEW)  Comparison: 01/02/2014chest radiograph, 07/02/2012 CT  Findings: Heart size upper normal.  Central vascular prominence is similar to prior.  No confluent airspace opacity.  No pleural effusion or pneumothorax.  Surgical clips at the GE junction.  The bowel gas pattern is non-obstructive. Rounded calcifications projecting over the right upper quadrant are most in keeping with gallstones.  Organ outlines are otherwise normal where seen. No acute or aggressive osseous abnormality identified.  IMPRESSION: No radiographic evidence of acute cardiopulmonary process.  Surgical clips at the GE junction.  Bowel gas pattern nonobstructive.  Gallstones.   Original Report Authenticated By: Jearld Lesch, M.D.      1. 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

## 2013-04-23 NOTE — ED Notes (Signed)
Patient is alert and oriented x3.  He is complaining of dizziness that has been going on for 2 days. Patient denies pain or nausea.

## 2013-04-24 ENCOUNTER — Encounter (HOSPITAL_COMMUNITY): Payer: Self-pay | Admitting: Internal Medicine

## 2013-04-24 DIAGNOSIS — R188 Other ascites: Secondary | ICD-10-CM

## 2013-04-24 DIAGNOSIS — S065X9A Traumatic subdural hemorrhage with loss of consciousness of unspecified duration, initial encounter: Secondary | ICD-10-CM | POA: Diagnosis present

## 2013-04-24 DIAGNOSIS — W19XXXA Unspecified fall, initial encounter: Secondary | ICD-10-CM

## 2013-04-24 DIAGNOSIS — D696 Thrombocytopenia, unspecified: Secondary | ICD-10-CM | POA: Diagnosis present

## 2013-04-24 DIAGNOSIS — I6200 Nontraumatic subdural hemorrhage, unspecified: Secondary | ICD-10-CM

## 2013-04-24 DIAGNOSIS — K7460 Unspecified cirrhosis of liver: Secondary | ICD-10-CM

## 2013-04-24 LAB — BASIC METABOLIC PANEL
CO2: 22 mEq/L (ref 19–32)
Calcium: 8.6 mg/dL (ref 8.4–10.5)
Potassium: 3.3 mEq/L — ABNORMAL LOW (ref 3.5–5.1)
Sodium: 142 mEq/L (ref 135–145)

## 2013-04-24 LAB — CBC WITH DIFFERENTIAL/PLATELET
Basophils Relative: 0 % (ref 0–1)
Eosinophils Absolute: 0.2 10*3/uL (ref 0.0–0.7)
Eosinophils Relative: 6 % — ABNORMAL HIGH (ref 0–5)
Hemoglobin: 11.4 g/dL — ABNORMAL LOW (ref 13.0–17.0)
MCH: 32.3 pg (ref 26.0–34.0)
MCHC: 35.4 g/dL (ref 30.0–36.0)
Monocytes Relative: 9 % (ref 3–12)
Neutrophils Relative %: 55 % (ref 43–77)
Platelets: 43 10*3/uL — ABNORMAL LOW (ref 150–400)

## 2013-04-24 LAB — MRSA PCR SCREENING: MRSA by PCR: NEGATIVE

## 2013-04-24 LAB — CBC
Hemoglobin: 11.3 g/dL — ABNORMAL LOW (ref 13.0–17.0)
MCV: 91.4 fL (ref 78.0–100.0)
Platelets: 39 10*3/uL — ABNORMAL LOW (ref 150–400)
RBC: 3.6 MIL/uL — ABNORMAL LOW (ref 4.22–5.81)
WBC: 3.2 10*3/uL — ABNORMAL LOW (ref 4.0–10.5)

## 2013-04-24 LAB — COMPREHENSIVE METABOLIC PANEL
ALT: 33 U/L (ref 0–53)
AST: 65 U/L — ABNORMAL HIGH (ref 0–37)
Albumin: 2.8 g/dL — ABNORMAL LOW (ref 3.5–5.2)
Calcium: 8.9 mg/dL (ref 8.4–10.5)
Creatinine, Ser: 1.01 mg/dL (ref 0.50–1.35)
GFR calc non Af Amer: 81 mL/min — ABNORMAL LOW (ref >90)
Sodium: 141 mEq/L (ref 135–145)
Total Protein: 6.4 g/dL (ref 6.0–8.3)

## 2013-04-24 LAB — URINALYSIS, ROUTINE W REFLEX MICROSCOPIC
Ketones, ur: NEGATIVE mg/dL
Leukocytes, UA: NEGATIVE
Nitrite: NEGATIVE
Protein, ur: NEGATIVE mg/dL

## 2013-04-24 LAB — RAPID URINE DRUG SCREEN, HOSP PERFORMED
Amphetamines: NOT DETECTED
Cocaine: POSITIVE — AB
Opiates: NOT DETECTED
Tetrahydrocannabinol: NOT DETECTED

## 2013-04-24 LAB — ETHANOL: Alcohol, Ethyl (B): 121 mg/dL — ABNORMAL HIGH (ref 0–11)

## 2013-04-24 LAB — PROTIME-INR
INR: 1.41 (ref 0.00–1.49)
Prothrombin Time: 16.9 seconds — ABNORMAL HIGH (ref 11.6–15.2)

## 2013-04-24 LAB — AMMONIA: Ammonia: 44 umol/L (ref 11–60)

## 2013-04-24 MED ORDER — ONDANSETRON HCL 4 MG/2ML IJ SOLN: 4.0000 mg | Freq: Four times a day (QID) | INTRAMUSCULAR | Status: DC | PRN

## 2013-04-24 MED ORDER — ENOXAPARIN SODIUM 40 MG/0.4ML ~~LOC~~ SOLN
40.0000 mg | SUBCUTANEOUS | Status: DC
  Filled 2013-04-24: qty 0.4

## 2013-04-24 MED ORDER — HYDROCODONE-ACETAMINOPHEN 5-325 MG PO TABS: 1.0000 | ORAL_TABLET | ORAL | Status: DC | PRN

## 2013-04-24 MED ORDER — ADULT MULTIVITAMIN W/MINERALS CH
1.0000 | ORAL_TABLET | Freq: Every day | ORAL | Status: DC
  Administered 2013-04-24 – 2013-04-25 (×2): 1 via ORAL
  Filled 2013-04-24 (×2): qty 1

## 2013-04-24 MED ORDER — THIAMINE HCL 100 MG/ML IJ SOLN
100.0000 mg | Freq: Every day | INTRAMUSCULAR | Status: DC
  Filled 2013-04-24 (×2): qty 1

## 2013-04-24 MED ORDER — FOLIC ACID 1 MG PO TABS
1.0000 mg | ORAL_TABLET | Freq: Every day | ORAL | Status: DC
  Administered 2013-04-24 – 2013-04-25 (×2): 1 mg via ORAL
  Filled 2013-04-24 (×2): qty 1

## 2013-04-24 MED ORDER — SODIUM CHLORIDE 0.9 % IV SOLN: INTRAVENOUS | Status: DC

## 2013-04-24 MED ORDER — OMEPRAZOLE MAGNESIUM 20 MG PO TBEC
20.0000 mg | DELAYED_RELEASE_TABLET | Freq: Every day | ORAL | Status: DC
  Administered 2013-04-24 – 2013-04-25 (×2): 20 mg via ORAL
  Filled 2013-04-24 (×2): qty 1

## 2013-04-24 MED ORDER — POTASSIUM CHLORIDE CRYS ER 20 MEQ PO TBCR
40.0000 meq | EXTENDED_RELEASE_TABLET | Freq: Once | ORAL | Status: AC
  Administered 2013-04-24: 40 meq via ORAL
  Filled 2013-04-24: qty 2

## 2013-04-24 MED ORDER — HYDROMORPHONE HCL PF 1 MG/ML IJ SOLN
1.0000 mg | INTRAMUSCULAR | Status: DC | PRN
  Administered 2013-04-24 – 2013-04-25 (×8): 1 mg via INTRAVENOUS
  Filled 2013-04-24 (×8): qty 1

## 2013-04-24 MED ORDER — LORAZEPAM 1 MG PO TABS: 0.0000 mg | ORAL_TABLET | Freq: Two times a day (BID) | ORAL | Status: DC

## 2013-04-24 MED ORDER — LORAZEPAM 1 MG PO TABS
0.0000 mg | ORAL_TABLET | Freq: Four times a day (QID) | ORAL | Status: DC
  Administered 2013-04-24 – 2013-04-25 (×2): 1 mg via ORAL
  Filled 2013-04-24: qty 2
  Filled 2013-04-24: qty 1

## 2013-04-24 MED ORDER — SODIUM CHLORIDE 0.9 % IJ SOLN
3.0000 mL | Freq: Two times a day (BID) | INTRAMUSCULAR | Status: DC
  Administered 2013-04-24 – 2013-04-25 (×4): 3 mL via INTRAVENOUS

## 2013-04-24 MED ORDER — PHYTONADIONE 5 MG PO TABS
5.0000 mg | ORAL_TABLET | Freq: Once | ORAL | Status: AC
  Administered 2013-04-24: 5 mg via ORAL
  Filled 2013-04-24: qty 1

## 2013-04-24 MED ORDER — ACETAMINOPHEN 325 MG PO TABS: 650.0000 mg | ORAL_TABLET | Freq: Once | ORAL | Status: AC

## 2013-04-24 MED ORDER — VITAMIN B-1 100 MG PO TABS
100.0000 mg | ORAL_TABLET | Freq: Every day | ORAL | Status: DC
  Administered 2013-04-24 – 2013-04-25 (×2): 100 mg via ORAL
  Filled 2013-04-24 (×2): qty 1

## 2013-04-24 MED ORDER — ONDANSETRON HCL 4 MG PO TABS: 4.0000 mg | ORAL_TABLET | Freq: Four times a day (QID) | ORAL | Status: DC | PRN

## 2013-04-24 NOTE — Progress Notes (Signed)
[facility] PROGRESS NOTE  Noah Medina ZOX:[id number] DOB: [birth year] DOA: 04/23/2013 PCP: Iona Hansen, NP  Assessment/Plan  Fall, likely related to alcohol and cocaine intoxication. Having symptoms of alcohol withdrawal currently, alcohol level 121. Physical therapy evaluated patient and recommended rolling walker and home health PT.   Alcohol and cocaine abuse. Counseled supervised cessation by slowly decreasing alcohol consumption under direction of primary care doctor as outpatient. Counseled cessation of drugs. Patient continues to deny use.  -  CIWA protocol -  Thiamine, folate, MVI  Severe malnutrition, likely secondary to cirrhosis of the liver, Hep C, and alcohol abuse. Encouraged PO intake and ensure supplements once or twice a day at home.   Subacute subdural hematomas, likely related to episodes of fall. Neurosurgery recommended observation of neurological status.  -  Spoke with neurosurgery regarding observation and follow up.  York Spaniel they would review case and get back to me later.   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 Platelets stable near 40K. Needs outpatient hepatology follow up.   Hypokalemia, likely due to poor potassium in diet/alcohol abuse. Repleted with oral potassium.   Transaminitis, in the pattern of alcohol use. Pt denies EtOH and IVDA abuse and states that he had 2 beers 3 days ago and before that no alcohol in one week, however, his serum alcohol level was 121 and his drug screen was positive for cocaine.   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 Follow up with hepatology.   Status post repair of ventral hernia, placed abdominal binder and feels better. Follow up with Dr. Rayburn Ma as recommended by Dr. Rayburn Ma.   Normocytic anemia, hgb stable from baseline.   Diet:  regular Access:  PIV IVF:  OFF Proph:  SCDs  Code Status: full Family Communication: spoke with patient  alone Disposition Plan: needs supervision at home.  Home health RN and PT.   Consultants:  Neurosurgery by phone  Procedures:  CT head  Antibiotics:  none   HPI/Subjective:  Denies fevers, chills, chest pain, SOB, nausea, vomiting, diarrhea, constipation.  Still feels off balance.  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    Objective: Filed Vitals:   04/24/13 0315 04/24/13 0330 04/24/13 0402 04/24/13 1325  BP:  120/69 134/67 141/59  Pulse: 80 78 73 78  Temp:   97.9 F (36.6 C) 98.2 F (36.8 C)  TempSrc:   Oral Oral  Resp: 24 22 20 18   Height:   5\' 11"  (1.803 m)   Weight:   90.2 kg (198 lb 13.7 oz)   SpO2: 97% 96% 100% 98%    Intake/Output Summary (Last 24 hours) at 04/24/13 1613 Last data filed at 04/24/13 1326  Gross per 24 hour  Intake    540 ml  Output    800 ml  Net   -260 ml   Filed Weights   04/23/13 2313 04/24/13 0402  Weight: 92.987 kg (205 lb) 90.2 kg (198 lb 13.7 oz)    Exam:  General: CM with temporal wasting, protuberant abdomen, no acute distress, obvious tremor of extremities  HEENT: PERRL, anicteric, mildly injected, nares clare, OP nonerythematous, + tongue fasciculations  Cardiovascular: RRR, no mrg, not tachycardic, 2+ pulses, warm extremities  Respiratory: CTAB  ABD: NABS, soft, moderately distended with fluid wave, nontender  MSK: Trace LEE bilaterally   Data Reviewed: Basic Metabolic Panel:  Recent Labs Lab 04/24/13 0045 04/24/13 0520  NA 141 142  K 3.3* 3.3*  CL  111 111  CO2 22 22  GLUCOSE 83 119*  BUN 6 7  CREATININE 1.01 1.04  CALCIUM 8.9 8.6   Liver Function Tests:  Recent Labs Lab 04/24/13 0045  AST 65*  ALT 33  ALKPHOS 77  BILITOT 1.7*  PROT 6.4  ALBUMIN 2.8*   No results found for this basename: LIPASE, AMYLASE,  in the last 168 hours  Recent Labs Lab 04/24/13 0520  AMMONIA 44   CBC:  Recent Labs Lab 04/23/13 2313 04/24/13 0110 04/24/13 0520  WBC 5.5 3.9* 3.2*  NEUTROABS 3.1 2.2  --    HGB 12.2* 11.4* 11.3*  HCT 34.4* 32.2* 32.9*  MCV 91.2 91.2 91.4  PLT 49* 43* 39*   Cardiac Enzymes: No results found for this basename: CKTOTAL, CKMB, CKMBINDEX, TROPONINI,  in the last 168 hours BNP (last 3 results) No results found for this basename: PROBNP,  in the last 8760 hours CBG: No results found for this basename: GLUCAP,  in the last 168 hours  Recent Results (from the past 240 hour(s))  MRSA PCR SCREENING     Status: None   Collection Time    04/24/13 11:10 AM      Result Value Range Status   MRSA by PCR NEGATIVE  NEGATIVE Final   Comment:            The GeneXpert MRSA Assay (FDA     approved for NASAL specimens     only), is one component of a     comprehensive MRSA colonization     surveillance program. It is not     intended to diagnose MRSA     infection nor to guide or     monitor treatment for     MRSA infections.     Studies: Ct Head Wo Contrast  04/23/2013  *RADIOLOGY REPORT*  Clinical Data: Dizziness  CT HEAD WITHOUT CONTRAST  Technique:  Contiguous axial images were obtained from the base of the skull through the vertex without contrast.  Comparison: 12/15/2012  Findings: Prominence of the sulci, cisterns, and ventricles, in keeping with diffuse cerebral volume loss.  There is an extra-axial fluid collection along the left cerebral convexity measuring up to 7 mm in thickness and isointense to brain parenchyma.  On the right, there is a 3 mm thickness extra-axial fluid collection isodense to slightly hyperdense attenuation along the right frontal convexity as seen on series 2 image 16. Mild associated mass effect however due to the volume loss, does not appear to be significant. No hydrocephalous.  No overt CT evidence for acute infarct.  No intraparenchymal hemorrhage. The visualized paranasal sinuses and mastoid air cells are predominately clear. Mild left posterolateral scalp swelling may reflect sequelae of prior hematoma and is not favored to be acute.  No  displaced calvarial fracture.  IMPRESSION: Bilateral extra-axial fluid collections as above are favored to reflect subacute left and acute on subacute right subdural hematomas.  Critical Value/emergent results were called by telephone at the time of interpretation on 04/23/2013 at 11:20 p.m. to Sharen Hones, who verbally acknowledged these results.   Original Report Authenticated By: Jearld Lesch, M.D.    Dg Abd Acute W/chest  04/23/2013  *RADIOLOGY REPORT*  Clinical Data: Abdominal pain, history of ventral hernia  ACUTE ABDOMEN SERIES (ABDOMEN 2 VIEW & CHEST 1 VIEW)  Comparison: 01/02/2014chest radiograph, 07/02/2012 CT  Findings: Heart size upper normal.  Central vascular prominence is similar to prior.  No confluent airspace opacity.  No pleural effusion  or pneumothorax.  Surgical clips at the GE junction.  The bowel gas pattern is non-obstructive. Rounded calcifications projecting over the right upper quadrant are most in keeping with gallstones.  Organ outlines are otherwise normal where seen. No acute or aggressive osseous abnormality identified.  IMPRESSION: No radiographic evidence of acute cardiopulmonary process.  Surgical clips at the GE junction.  Bowel gas pattern nonobstructive.  Gallstones.   Original Report Authenticated By: Jearld Lesch, M.D.     Scheduled Meds: . folic acid  1 mg Oral Daily  . LORazepam  0-4 mg Oral Q6H   Followed by  . [START ON 04/26/2013] LORazepam  0-4 mg Oral Q12H  . multivitamin with minerals  1 tablet Oral Daily  . omeprazole  20 mg Oral Daily  . sodium chloride  3 mL Intravenous Q12H  . thiamine  100 mg Oral Daily   Or  . 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 If 7PM-7AM, please contact night-coverage at [email or web address], password [facility] 04/24/2013, 4:13 PM  LOS: 1 day

## 2013-04-24 NOTE — H&P (Signed)
[facility] History and Physical  Noah Medina YNW:[id number] DOB: October 14, 1956 DOA: 04/23/2013  Referring physician: ED physician PCP: Iona Hansen, NP   Chief Complaint: dizziness  HPI:  Pt is 57 yo male who presented to [facility] ED with main concern of unsteady gait, ongoing and present for several months, associated with several episodes of fall at home. He explains he has not seen any PCP for this but would like to find out the cause of this. He denies any specific focal neurological symptoms, has generalized weakness, no visual changes, no headaches, no chest pain, no shortness of breath, no specific urinary concerns. He explains he has been having some abdominal discomfort where his hernia is located. He denies nausea or vomiting, no other abdominal concerns.  In ED, pt with unsteady gait, acute on chronic subdural hematoma noted on CT head. [facility] asked to admit for observation. Per ED physician, neurosurgery recommended observation.  Assessment and Plan:  Principal Problem:   Fall - unclear etiology at this time, will admit to telemetry bed for further evaluation - ? Alcohol abuse as it is suggested by transaminitis and thrombocytopenia, pt denies use - I will place order for Alcohol level and UDS, order PT evaluation - consider neurology consultation in AM Active Problems:   Severe malnutrition - secondary to cirrhosis of the liver, Hep C, ? Alcohols use - encouraged PO intake   Subdural hematoma - likely related to episodes of fall - neurosurgery recommended observation of neurological status, if worse may need to call them back - PT evaluation - avoid heparin products    Thrombocytopenia, unspecified - possibly related to alchol use vs liver disease  - avoid heparin products, no signs of active bleeding - last Plt 12/2012 ~ 40, so appears to be at baseline  - repeat CBC in AM   Hypokalemia - mild, supplement and repeat BMP in AM   Transaminitis  - in the pattern of  alcohol use, pt denies but explains he has history of EtOH and IVDA abuse   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 Negative for diaphoresis.  HENT: Negative for hearing loss, ear pain, nosebleeds, congestion, sore throat, neck pain, tinnitus and ear discharge.   Eyes: Negative for blurred vision, double vision, photophobia, pain, discharge and redness.  Respiratory: Negative for cough, hemoptysis, sputum production, shortness of breath, wheezing and stridor.   Cardiovascular: Negative for chest pain, palpitations, orthopnea, claudication and leg swelling.  Gastrointestinal: Positive for nausea, abdominal pain. Negative for heartburn, constipation, blood in stool and melena.  Genitourinary: Negative for dysuria, urgency, frequency, hematuria and flank pain.  Musculoskeletal: Negative for myalgias, back pain, joint pain and falls.  Skin: Negative for itching and rash.  Neurological: Negative for tingling, tremors, sensory change, speech change, focal weakness,and headaches.  Endo/Heme/Allergies: Negative for environmental allergies and polydipsia. Does not bruise/bleed easily.  Psychiatric/Behavioral: Negative for suicidal ideas. The patient is not nervous/anxious.      Past Medical History  Diagnosis Date  . Cirrhosis of liver   . Hepatitis C 03-28-12    ? 80's-prev. IV drug abuse  . 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 Blood transfusion 03-28-12    '81-hx. of bleeding ulcer  . History of bleeding ulcers 03-28-12    Hx. '81  . GERD (gastroesophageal reflux disease) 03-26-12    Hx. reflux.  ulcers in past, occ. OTC med  . Incisional hernia     has drainage system from abdomin    Past Surgical History  Procedure Laterality Date  . Umbilical hernia repair    . Hernia repair  08/24/11    ventral hernia with mesh  . Hernia repair  11'11    Umbilical  . Peptic ulcer  03-26-12    open peptic ulcer surgery repair- '81  . Incisional hernia repair  03/26/2012  . Incisional hernia repair  03/30/2012    Procedure: LAPAROSCOPIC INCISIONAL HERNIA;  Surgeon: Shelly Rubenstein, MD;  Location: [facility];  Service: General;  Laterality: N/A;  Laparoscopic Incisional/ventral Hernia Repair with Mesh  . Tonsillectomy    . Appendectomy    . Incisional hernia repair  07/20/2012    mesh  . Incisional hernia repair  07/20/2012    Procedure: HERNIA REPAIR INCISIONAL;  Surgeon: Shelly Rubenstein, MD;  Location: [facility] OR;  Service: General;  Laterality: N/A;  repair of incisional hernia with Biologic mesh    Social History:  reports that he has been smoking Cigarettes.  He has a 9.5 pack-year smoking history. He has never used smokeless tobacco. He reports that he does not drink alcohol or use illicit drugs.  No Known Allergies  Family History  Problem Relation Age of Onset  . Cancer Mother     breast  . Cancer Father     brain  . Heart disease Brother   . Cancer Sister     breast  . Cancer Sister     breast    Prior to Admission medications   Medication Sig Start Date End Date Taking? Authorizing Provider  HYDROmorphone (DILAUDID) 4 MG tablet Take 4 mg by mouth every 6 (six) hours as needed for pain.   Yes Historical Provider, MD  Multiple Vitamin (MULITIVITAMIN WITH MINERALS) TABS Take 1 tablet by mouth daily.   Yes Historical Provider, MD  omeprazole (PRILOSEC OTC) 20 MG tablet Take 20 mg by mouth daily.   Yes Historical Provider, MD    Physical Exam: Filed Vitals:   04/23/13 2313 04/24/13 0243  BP: 135/79 132/86  Pulse: 84 69  Temp: 97.9 F (36.6 C)   TempSrc: Oral   Resp: 15 14   Height: 5\' 11"  (1.803 m)   Weight: 92.987 kg (205 lb)   SpO2: 98% 99%    Physical Exam  Constitutional: Appears well-developed and well-nourished. No distress.  HENT: Normocephalic. External right and left ear normal. Oropharynx is clear and moist.  Eyes: Conjunctivae and EOM are normal. PERRLA, no scleral icterus.  Neck: Normal ROM. Neck supple. No JVD. No tracheal deviation. No thyromegaly.  CVS: RRR, S1/S2 +, no murmurs, no gallops, no carotid bruit.  Pulmonary: Effort and breath sounds normal, no stridor, rhonchi, wheezes, rales.  Abdominal: Soft. BS +,  Slightly distended with ventral hernia, reducible, slightly tender  Musculoskeletal: Normal range of motion.   Lymphadenopathy: No lymphadenopathy noted, cervical, inguinal. Neuro: Alert. Normal reflexes, muscle tone coordination. No cranial nerve deficit. Skin: Skin is warm and dry. No rash noted. Not diaphoretic. No erythema. No pallor.  Psychiatric: Normal mood and affect. Behavior, judgment, thought content normal.   Labs on Admission:  Basic Metabolic Panel:  Recent Labs Lab 04/24/13 0045  NA 141  K 3.3*  CL 111  CO2 22  GLUCOSE 83  BUN 6  CREATININE 1.01  CALCIUM 8.9   Liver Function Tests:  Recent Labs Lab 04/24/13 0045  AST 65*  ALT 33  ALKPHOS 77  BILITOT 1.7*  PROT 6.4  ALBUMIN 2.8*    CBC:  Recent Labs Lab 04/23/13 2313 04/24/13 0110  WBC 5.5 3.9*  NEUTROABS 3.1 2.2  HGB 12.2* 11.4*  HCT 34.4* 32.2*  MCV 91.2 91.2  PLT 49* 43*   Radiological Exams on Admission: Ct Head Wo Contrast  04/23/2013  *RADIOLOGY REPORT*  Clinical Data: Dizziness  CT HEAD WITHOUT CONTRAST  Technique:  Contiguous axial images were obtained from the base of the skull through the vertex without contrast.  Comparison: 12/15/2012  Findings: Prominence of the sulci, cisterns, and ventricles, in keeping with diffuse cerebral volume loss.  There is an extra-axial fluid collection along the left cerebral convexity  measuring up to 7 mm in thickness and isointense to brain parenchyma.  On the right, there is a 3 mm thickness extra-axial fluid collection isodense to slightly hyperdense attenuation along the right frontal convexity as seen on series 2 image 16. Mild associated mass effect however due to the volume loss, does not appear to be significant. No hydrocephalous.  No overt CT evidence for acute infarct.  No intraparenchymal hemorrhage. The visualized paranasal sinuses and mastoid air cells are predominately clear. Mild left posterolateral scalp swelling may reflect sequelae of prior hematoma and is not favored to be acute.  No displaced calvarial fracture.  IMPRESSION: Bilateral extra-axial fluid collections as above are favored to reflect subacute left and acute on subacute right subdural hematomas.  Critical Value/emergent results were called by telephone at the time of interpretation on 04/23/2013 at 11:20 p.m. to Sharen Hones, who verbally acknowledged these results.   Original Report Authenticated By: Jearld Lesch, M.D.    Dg Abd Acute W/chest  04/23/2013  *RADIOLOGY REPORT*  Clinical Data: Abdominal pain, history of ventral hernia  ACUTE ABDOMEN SERIES (ABDOMEN 2 VIEW & CHEST 1 VIEW)  Comparison: 01/02/2014chest radiograph, 07/02/2012 CT  Findings: Heart size upper normal.  Central vascular prominence is similar to prior.  No confluent airspace opacity.  No pleural effusion or pneumothorax.  Surgical clips at the GE junction.  The bowel gas pattern is non-obstructive. Rounded calcifications projecting over the right upper quadrant are most in keeping with gallstones.  Organ outlines are otherwise normal where seen. No acute or aggressive osseous abnormality identified.  IMPRESSION: No radiographic evidence of acute cardiopulmonary process.  Surgical clips at the GE junction.  Bowel gas pattern nonobstructive.  Gallstones.   Original Report Authenticated By: Jearld Lesch, M.D.     EKG: Normal sinus  rhythm, no ST/T wave changes  Debbora Presto, MD  [facility] Pager [phone]  If 7PM-7AM, please contact night-coverage [email or web address] Password [facility] 04/24/2013, 3:34 AM

## 2013-04-24 NOTE — Care Management Note (Addendum)
    Page 1 of 1   04/25/2013     11:43:55 AM   CARE MANAGEMENT NOTE 04/25/2013  Patient:  ODES, LOLLI   Account Number:  [id number]  Date Initiated:  04/24/2013  Documentation initiated by:  Lanier Clam  Subjective/Objective Assessment:   ADMITTED W/FALL.SUBDURAL HEMATOMA.WU:[id number] LIVER,HEP C.     Action/Plan:   FROM HOME ALONE.HAS PCP,PHARMACY.   Anticipated DC Date:  04/25/2013   Anticipated DC Plan:  HOME W HOME HEALTH SERVICES      DC Planning Services  CM consult      Choice offered to / List presented to:  C-1 Patient        HH arranged  HH-1 RN      [facility] agency  [facility]   Status of service:  Completed, signed off Medicare Important Message given?   (If response is "NO", the following Medicare IM given date fields will be blank) Date Medicare IM given:   Date Additional Medicare IM given:    Discharge Disposition:  HOME W HOME HEALTH SERVICES  Per UR Regulation:  Reviewed for med. necessity/level of care/duration of stay  If discussed at Long Length of Stay Meetings, dates discussed:    Comments:  04/25/13 Ambria Mayfield RN,BS NCM [phone] [facility] HHRN IS THE SERVICE THAT CAN BE PROVIDED W/INSURANCE.PATIENT GOING TO [facility] KRISTEN AWARE.  04/24/13 Jimmy Plessinger RN,BSN NCM [phone] PT/OT-HH.[facility] KRISTEN ABLE TO PROVIDE HH.PATIENT STATES HOEMELESS.CSW AWARE & FOLLOWING.

## 2013-04-24 NOTE — Consult Note (Signed)
Reason for Consult: Subdural hematoma Referring Physician: Dr. Matilde Haymaker is an 57 y.o. male.  HPI: Patient is a 57 year old individual who notes that he struck his head sometime around January 1 is been having headaches nosebleeds and has been falling around a lot the patient is known for alcoholic intoxication. A CT scan of the brain demonstrates the presence of a small right frontal subdural hematoma without shift or mass effect. Subacute in nature.  Past Medical History  Diagnosis Date  . Cirrhosis of liver   . Hepatitis C 03-28-12    ? 80's-prev. IV drug abuse  . 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 Blood transfusion 03-28-12    '81-hx. of bleeding ulcer  . History of bleeding ulcers 03-28-12    Hx. '81  . GERD (gastroesophageal reflux disease) 03-26-12    Hx. reflux. ulcers in past, occ. OTC med  . Incisional hernia     has drainage system from abdomin    Past Surgical History  Procedure Laterality Date  . Umbilical hernia repair    . Hernia repair  08/24/11    ventral hernia with mesh  . Hernia repair  11'11    Umbilical  . Peptic ulcer  03-26-12    open peptic ulcer surgery repair- '81  . Incisional hernia repair  03/26/2012  . Incisional hernia repair  03/30/2012    Procedure: LAPAROSCOPIC INCISIONAL HERNIA;  Surgeon: Shelly Rubenstein, MD;  Location: [facility];  Service: General;  Laterality: N/A;  Laparoscopic Incisional/ventral Hernia Repair with Mesh  . Tonsillectomy    . Appendectomy    . Incisional hernia repair  07/20/2012    mesh  . Incisional hernia repair  07/20/2012    Procedure: HERNIA REPAIR INCISIONAL;  Surgeon: Shelly Rubenstein, MD;  Location: [facility];  Service: General;  Laterality: N/A;  repair of incisional hernia with Biologic mesh    Family History  Problem Relation Age of Onset  . Cancer Mother     breast  . Cancer Father     brain  . Heart disease Brother   . Cancer Sister     breast  . Cancer Sister      breast    Social History:  reports that he has been smoking Cigarettes.  He has a 9.5 pack-year smoking history. He has never used smokeless tobacco. He reports that he does not drink alcohol or use illicit drugs.  Allergies: No Known Allergies  Medications: I have reviewed the patient's current medications.  Results for orders placed during the hospital encounter of 04/23/13 (from the past 48 hour(s))  URINALYSIS, ROUTINE W REFLEX MICROSCOPIC     Status: Abnormal   Collection Time    04/23/13 11:07 PM      Result Value Range   Color, Urine YELLOW  YELLOW   APPearance CLEAR  CLEAR   Specific Gravity, Urine 1.007  1.005 - 1.030   pH 6.0  5.0 - 8.0   Glucose, UA NEGATIVE  NEGATIVE mg/dL   Hgb urine dipstick TRACE (*) NEGATIVE   Bilirubin Urine NEGATIVE  NEGATIVE   Ketones, ur NEGATIVE  NEGATIVE mg/dL   Protein, ur NEGATIVE  NEGATIVE mg/dL   Urobilinogen, UA 1.0  0.0 - 1.0 mg/dL   Nitrite NEGATIVE  NEGATIVE   Leukocytes, UA NEGATIVE  NEGATIVE  URINE MICROSCOPIC-ADD ON     Status: None   Collection Time    04/23/13 11:07 PM      Result  Value Range   RBC / HPF 0-2  <3 RBC/hpf  CBC WITH DIFFERENTIAL     Status: Abnormal   Collection Time    04/23/13 11:13 PM      Result Value Range   WBC 5.5  4.0 - 10.5 K/uL   RBC 3.77 (*) 4.22 - 5.81 MIL/uL   Hemoglobin 12.2 (*) 13.0 - 17.0 g/dL   HCT 16.1 (*) 09.6 - 04.5 %   MCV 91.2  78.0 - 100.0 fL   MCH 32.4  26.0 - 34.0 pg   MCHC 35.5  30.0 - 36.0 g/dL   RDW 40.9 (*) 81.1 - 91.4 %   Platelets 49 (*) 150 - 400 K/uL   Comment: REPEATED TO VERIFY     SPECIMEN CHECKED FOR CLOTS     PLATELET COUNT CONFIRMED BY SMEAR   Neutrophils Relative 58  43 - 77 %   Lymphocytes Relative 27  12 - 46 %   Monocytes Relative 10  3 - 12 %   Eosinophils Relative 5  0 - 5 %   Basophils Relative 0  0 - 1 %   Neutro Abs 3.1  1.7 - 7.7 K/uL   Lymphs Abs 1.5  0.7 - 4.0 K/uL   Monocytes Absolute 0.6  0.1 - 1.0 K/uL   Eosinophils Absolute 0.3  0.0 - 0.7 K/uL    Basophils Absolute 0.0  0.0 - 0.1 K/uL   Smear Review PLATELET COUNT CONFIRMED BY SMEAR    COMPREHENSIVE METABOLIC PANEL     Status: Abnormal   Collection Time    04/24/13 12:45 AM      Result Value Range   Sodium 141  135 - 145 mEq/L   Potassium 3.3 (*) 3.5 - 5.1 mEq/L   Chloride 111  96 - 112 mEq/L   CO2 22  19 - 32 mEq/L   Glucose, Bld 83  70 - 99 mg/dL   BUN 6  6 - 23 mg/dL   Creatinine, Ser 7.82  0.50 - 1.35 mg/dL   Calcium 8.9  8.4 - 95.6 mg/dL   Total Protein 6.4  6.0 - 8.3 g/dL   Albumin 2.8 (*) 3.5 - 5.2 g/dL   AST 65 (*) 0 - 37 U/L   ALT 33  0 - 53 U/L   Alkaline Phosphatase 77  39 - 117 U/L   Total Bilirubin 1.7 (*) 0.3 - 1.2 mg/dL   GFR calc non Af Amer 81 (*) >90 mL/min   GFR calc Af Amer >90  >90 mL/min   Comment:            The eGFR has been calculated     using the CKD EPI equation.     This calculation has not been     validated in all clinical     situations.     eGFR's persistently     <90 mL/min signify     possible Chronic Kidney Disease.  CBC WITH DIFFERENTIAL     Status: Abnormal   Collection Time    04/24/13  1:10 AM      Result Value Range   WBC 3.9 (*) 4.0 - 10.5 K/uL   RBC 3.53 (*) 4.22 - 5.81 MIL/uL   Hemoglobin 11.4 (*) 13.0 - 17.0 g/dL   HCT 21.3 (*) 08.6 - 57.8 %   MCV 91.2  78.0 - 100.0 fL   MCH 32.3  26.0 - 34.0 pg   MCHC 35.4  30.0 - 36.0 g/dL  RDW 16.3 (*) 11.5 - 15.5 %   Platelets 43 (*) 150 - 400 K/uL   Comment: REPEATED TO VERIFY     SPECIMEN CHECKED FOR CLOTS     CONSISTENT WITH PREVIOUS RESULT   Neutrophils Relative 55  43 - 77 %   Neutro Abs 2.2  1.7 - 7.7 K/uL   Lymphocytes Relative 30  12 - 46 %   Lymphs Abs 1.2  0.7 - 4.0 K/uL   Monocytes Relative 9  3 - 12 %   Monocytes Absolute 0.3  0.1 - 1.0 K/uL   Eosinophils Relative 6 (*) 0 - 5 %   Eosinophils Absolute 0.2  0.0 - 0.7 K/uL   Basophils Relative 0  0 - 1 %   Basophils Absolute 0.0  0.0 - 0.1 K/uL  PROTIME-INR     Status: Abnormal   Collection Time     04/24/13  1:10 AM      Result Value Range   Prothrombin Time 16.9 (*) 11.6 - 15.2 seconds   INR 1.41  0.00 - 1.49  URINE RAPID DRUG SCREEN (HOSP PERFORMED)     Status: Abnormal   Collection Time    04/24/13  5:00 AM      Result Value Range   Opiates NONE DETECTED  NONE DETECTED   Cocaine POSITIVE (*) NONE DETECTED   Benzodiazepines NONE DETECTED  NONE DETECTED   Amphetamines NONE DETECTED  NONE DETECTED   Tetrahydrocannabinol NONE DETECTED  NONE DETECTED   Barbiturates NONE DETECTED  NONE DETECTED   Comment:            DRUG SCREEN FOR MEDICAL PURPOSES     ONLY.  IF CONFIRMATION IS NEEDED     FOR ANY PURPOSE, NOTIFY LAB     WITHIN 5 DAYS.                LOWEST DETECTABLE LIMITS     FOR URINE DRUG SCREEN     Drug Class       Cutoff (ng/mL)     Amphetamine      1000     Barbiturate      200     Benzodiazepine   200     Tricyclics       300     Opiates          300     Cocaine          300     THC              50  TSH     Status: None   Collection Time    04/24/13  5:20 AM      Result Value Range   TSH 0.946  0.350 - 4.500 uIU/mL  ETHANOL     Status: Abnormal   Collection Time    04/24/13  5:20 AM      Result Value Range   Alcohol, Ethyl (B) 121 (*) 0 - 11 mg/dL   Comment:            LOWEST DETECTABLE LIMIT FOR     SERUM ALCOHOL IS 11 mg/dL     FOR MEDICAL PURPOSES ONLY  AMMONIA     Status: None   Collection Time    04/24/13  5:20 AM      Result Value Range   Ammonia 44  11 - 60 umol/L  BASIC METABOLIC PANEL     Status: Abnormal   Collection Time  04/24/13  5:20 AM      Result Value Range   Sodium 142  135 - 145 mEq/L   Potassium 3.3 (*) 3.5 - 5.1 mEq/L   Chloride 111  96 - 112 mEq/L   CO2 22  19 - 32 mEq/L   Glucose, Bld 119 (*) 70 - 99 mg/dL   BUN 7  6 - 23 mg/dL   Creatinine, Ser 4.09  0.50 - 1.35 mg/dL   Calcium 8.6  8.4 - 81.1 mg/dL   GFR calc non Af Amer 78 (*) >90 mL/min   GFR calc Af Amer >90  >90 mL/min   Comment:            The eGFR has been  calculated     using the CKD EPI equation.     This calculation has not been     validated in all clinical     situations.     eGFR's persistently     <90 mL/min signify     possible Chronic Kidney Disease.  CBC     Status: Abnormal   Collection Time    04/24/13  5:20 AM      Result Value Range   WBC 3.2 (*) 4.0 - 10.5 K/uL   RBC 3.60 (*) 4.22 - 5.81 MIL/uL   Hemoglobin 11.3 (*) 13.0 - 17.0 g/dL   HCT 91.4 (*) 78.2 - 95.6 %   MCV 91.4  78.0 - 100.0 fL   MCH 31.4  26.0 - 34.0 pg   MCHC 34.3  30.0 - 36.0 g/dL   RDW 21.3 (*) 08.6 - 57.8 %   Platelets 39 (*) 150 - 400 K/uL   Comment: SPECIMEN CHECKED FOR CLOTS     CONSISTENT WITH PREVIOUS RESULT  MRSA PCR SCREENING     Status: None   Collection Time    04/24/13 11:10 AM      Result Value Range   MRSA by PCR NEGATIVE  NEGATIVE   Comment:            The GeneXpert MRSA Assay (FDA     approved for NASAL specimens     only), is one component of a     comprehensive MRSA colonization     surveillance program. It is not     intended to diagnose MRSA     infection nor to guide or     monitor treatment for     MRSA infections.    Ct Head Wo Contrast  04/23/2013  *RADIOLOGY REPORT*  Clinical Data: Dizziness  CT HEAD WITHOUT CONTRAST  Technique:  Contiguous axial images were obtained from the base of the skull through the vertex without contrast.  Comparison: 12/15/2012  Findings: Prominence of the sulci, cisterns, and ventricles, in keeping with diffuse cerebral volume loss.  There is an extra-axial fluid collection along the left cerebral convexity measuring up to 7 mm in thickness and isointense to brain parenchyma.  On the right, there is a 3 mm thickness extra-axial fluid collection isodense to slightly hyperdense attenuation along the right frontal convexity as seen on series 2 image 16. Mild associated mass effect however due to the volume loss, does not appear to be significant. No hydrocephalous.  No overt CT evidence for acute  infarct.  No intraparenchymal hemorrhage. The visualized paranasal sinuses and mastoid air cells are predominately clear. Mild left posterolateral scalp swelling may reflect sequelae of prior hematoma and is not favored to be acute.  No displaced calvarial fracture.  IMPRESSION: Bilateral extra-axial fluid collections as above are favored to reflect subacute left and acute on subacute right subdural hematomas.  Critical Value/emergent results were called by telephone at the time of interpretation on 04/23/2013 at 11:20 p.m. to Sharen Hones, who verbally acknowledged these results.   Original Report Authenticated By: Jearld Lesch, M.D.    Dg Abd Acute W/chest  04/23/2013  *RADIOLOGY REPORT*  Clinical Data: Abdominal pain, history of ventral hernia  ACUTE ABDOMEN SERIES (ABDOMEN 2 VIEW & CHEST 1 VIEW)  Comparison: 01/02/2014chest radiograph, 07/02/2012 CT  Findings: Heart size upper normal.  Central vascular prominence is similar to prior.  No confluent airspace opacity.  No pleural effusion or pneumothorax.  Surgical clips at the GE junction.  The bowel gas pattern is non-obstructive. Rounded calcifications projecting over the right upper quadrant are most in keeping with gallstones.  Organ outlines are otherwise normal where seen. No acute or aggressive osseous abnormality identified.  IMPRESSION: No radiographic evidence of acute cardiopulmonary process.  Surgical clips at the GE junction.  Bowel gas pattern nonobstructive.  Gallstones.   Original Report Authenticated By: Jearld Lesch, M.D.     Review of Systems  Constitutional: Positive for malaise/fatigue.  HENT: Positive for nosebleeds.   Eyes: Negative.   Cardiovascular: Negative.   Genitourinary: Negative.   Musculoskeletal: Positive for falls.  Neurological: Positive for dizziness and headaches.  Endo/Heme/Allergies: Negative.   Psychiatric/Behavioral: Negative.    Blood pressure 134/61, pulse 78, temperature 98.4 F (36.9 C),  temperature source Oral, resp. rate 18, height 5\' 11"  (1.803 m), weight 90.2 kg (198 lb 13.7 oz), SpO2 94.00%. Physical Exam  Constitutional: He is oriented to person, place, and time. He appears well-developed and well-nourished.  HENT:  Head: Normocephalic and atraumatic.  Eyes: Conjunctivae and EOM are normal. Pupils are equal, round, and reactive to light.  Neck: Normal range of motion. Neck supple.  Musculoskeletal: Normal range of motion.  Neurological: He is alert and oriented to person, place, and time. He has normal reflexes.  Patient is alert oriented seems anxious he is tremulous in his upper extremities and has asterixis  Skin: Skin is warm and dry.  Psychiatric:  Anxious    Assessment/Plan: The small nature of the subdural should resolve itself the patient's coagulopathy and liver disease can be controlled. I advised patient he should not use any blood thinning agents such as aspirin Advil or Aleve. He is aware of the need to refrain from drinking. Followup can be on a when necessary basis of his clinical status should deteriorate. It is okay to discharge him from a neurosurgical standpoint.  Vannia Pola J 04/24/2013, 10:32 PM

## 2013-04-24 NOTE — Progress Notes (Signed)
Clinical Social Work Department BRIEF PSYCHOSOCIAL ASSESSMENT 04/24/2013  Patient:  Noah Medina, Noah Medina     Account Number:  [id number]     Admit date:  04/23/2013  Clinical Social Worker:  Dennison Bulla  Date/Time:  04/24/2013 04:45 PM  Referred by:  Care Management  Date Referred:  04/24/2013 Referred for  Homelessness   Other Referral:   Interview type:  Patient Other interview type:    PSYCHOSOCIAL DATA Living Status:  OTHER Admitted from facility:   Level of care:   Primary support name:  Noah Medina Primary support relationship to patient:  SIBLING Degree of support available:   Adequate    CURRENT CONCERNS Current Concerns  Other - See comment   Other Concerns:   Homelessness    SOCIAL WORK ASSESSMENT / PLAN CSW received referral from CM that patient is homeless. CSW reviewed chart and met with patient at bedside. CSW introduced myself and explained role.    Patient reports he has been struggling with finding permanent housing for about 2 years. Patient had an apartment but fought with the landlord and was asked to leave. Patient is currently staying on the streets or staying with friends. Patient has a sister and a son but reports he does not want to ask to stay with them. Patient has never stayed at a shelter and receives about $740/month in disability.    CSW provided patient with shelter list and explained [facility] near hospital. CSW and patient spoke about budgets and patient feels he could afford an apartment around $350-400/month. Patient receives food stamps and reports that he does well budgeting his money and stays in hotels when he can afford it.    CSW provided low income housing list and encouraged patient to use hospital phone to start lining up appointments for housing. CSW and patient spoke about patient staying in shelter to save money for deposit and rent money. Patient agreeable to plan and thanked CSW for assistance.    There was mention of  possible substance use in chart. When CSW asked patient about substance use, patient denied all substance use. CSW is signing off but available if further needs arise.   Assessment/plan status:  No Further Intervention Required Other assessment/ plan:   Information/referral to community resources:   Low income housing  Shelter list    PATIENT'S/FAMILY'S RESPONSE TO PLAN OF CARE: Patient alert and oriented. Patient thanked CSW for resources and agreeable to start looking for housing while in hospital.

## 2013-04-24 NOTE — ED Provider Notes (Signed)
Medical screening examination/treatment/procedure(s) were performed by non-physician practitioner and as supervising physician I was immediately available for consultation/collaboration.   Dione Booze, MD 04/24/13 [phone]

## 2013-04-24 NOTE — Evaluation (Addendum)
Physical Therapy Evaluation Patient Details Name: Noah Medina MRN: [id number] DOB: 12-22-1955 Today's Date: 04/24/2013 Time: 0865-7846 PT Time Calculation (min): 31 min  PT Assessment / Plan / Recommendation Clinical Impression  Pt presents with dizziness and falls at home (he states several months ago, however H&P note states 3-4 weeks ago). Also note that he has an acute on chronic subdural hematoma and hx of hernia repair (wearing abdominal binder for pain relief).  Pt with normal smooth persuits, unable to fully test head thrust as pt would not relax enough to let therapist fully test.  He did state that it made him dizzy and did note some slight saccadic movements.  Also took pt through [employer] and R/L horizontal canal BBPV test with no nystagmus noted and very little dizziness.  Tolerated ambulation in hallway well with RW at min assist with RW.  Did note that pt is shaky (more UE) and slighty off balance (per chart there is ?ETOH abuse).  Feel that he will benefit from skilled PT in acute venue to address deficits and teach compensatory strategies if dizziness persists and recommend HHPT for follow up with as close to 24/7 as pt can have.  Also recommend RW.     PT Assessment  Patient needs continued PT services    Follow Up Recommendations  Home health PT;Supervision/Assistance - 24 hour    Does the patient have the potential to tolerate intense rehabilitation      Barriers to Discharge Decreased caregiver support      Equipment Recommendations  Rolling walker with 5" wheels    Recommendations for Other Services OT consult   Frequency Min 3X/week    Precautions / Restrictions Precautions Precautions: Fall Restrictions Weight Bearing Restrictions: No   Pertinent Vitals/Pain Mild pain in abdomen (esp when going from supine to sitting up for [employer])      Mobility  Bed Mobility Bed Mobility: Supine to Sit Supine to Sit: 6: Modified independent  (Device/Increase time) Details for Bed Mobility Assistance: Increased time due to pain in abdomen. Transfers Transfers: Sit to Stand;Stand to Sit Sit to Stand: 4: Min guard;With upper extremity assist;From bed Stand to Sit: 4: Min guard;With upper extremity assist;With armrests;To chair/3-in-1 Details for Transfer Assistance: Min/guard for safety with cues for hand placement and safety.  Ambulation/Gait Ambulation/Gait Assistance: 4: Min assist Ambulation Distance (Feet): 180 Feet Assistive device: Rolling walker Ambulation/Gait Assistance Details: Cues for sequencing/technique with RW, upright posture and safety esp when making turns, as he states that increases dizziness.  Also note that he has UE tremors with amb and he states that is normal for him Gait Pattern: Step-through pattern;Decreased stride length;Ataxic (mildly ataxic) Gait velocity: decreased    Exercises     PT Diagnosis: Difficulty walking;Generalized weakness;Abnormality of gait  PT Problem List: Decreased strength;Decreased activity tolerance;Decreased balance;Decreased mobility;Decreased coordination;Decreased knowledge of use of DME;Decreased safety awareness;Decreased knowledge of precautions PT Treatment Interventions: DME instruction;Stair training;Gait training;Functional mobility training;Therapeutic activities;Therapeutic exercise;Balance training;Patient/family education   PT Goals Acute Rehab PT Goals PT Goal Formulation: With patient Time For Goal Achievement: 05/01/13 Potential to Achieve Goals: Good Pt will go Sit to Stand: with modified independence PT Goal: Sit to Stand - Progress: Goal set today Pt will go Stand to Sit: with modified independence PT Goal: Stand to Sit - Progress: Goal set today Pt will Ambulate: >150 feet;with modified independence;with least restrictive assistive device PT Goal: Ambulate - Progress: Goal set today Pt will Go Up / Down  Stairs: 3-5 stairs;with supervision;with least  restrictive assistive device PT Goal: Up/Down Stairs - Progress: Goal set today Pt will Perform Home Exercise Program: with supervision, verbal cues required/provided PT Goal: Perform Home Exercise Program - Progress: Goal set today Additional Goals Additional Goal #1: Pt will perform compensatory strategies with cues to decrease dizziness and therefore decrease risk for fall.  PT Goal: Additional Goal #1 - Progress: Goal set today  Visit Information  Last PT Received On: 04/24/13 Assistance Needed: +1    Subjective Data  Subjective: I felt like I was in a hurricane Patient Stated Goal: to return home.    Prior Functioning  Home Living Lives With: Alone Type of Home: House Home Access: Stairs to enter Secretary/administrator of Steps: 4 Entrance Stairs-Rails: None Home Layout: One level Bathroom Shower/Tub: Engineer, manufacturing systems: Standard Home Adaptive Equipment: Straight cane Prior Function Level of Independence: Independent Able to Take Stairs?: Yes Driving: No Communication Communication: No difficulties    Cognition  Cognition Arousal/Alertness: Awake/alert Behavior During Therapy: WFL for tasks assessed/performed Overall Cognitive Status: Within Functional Limits for tasks assessed    Extremity/Trunk Assessment Right Lower Extremity Assessment RLE ROM/Strength/Tone: [facility] for tasks assessed;Deficits RLE ROM/Strength/Tone Deficits: did note some swelling in LEs RLE Sensation: WFL - Light Touch Left Lower Extremity Assessment LLE ROM/Strength/Tone: WFL for tasks assessed;Deficits LLE ROM/Strength/Tone Deficits: did note some swelling in LEs LLE Sensation: WFL - Light Touch Trunk Assessment Trunk Assessment: Normal   Balance Balance Balance Assessed: Yes Static Sitting Balance Static Sitting - Balance Support: No upper extremity supported;Feet supported Static Sitting - Level of Assistance: 7: Independent Static Standing Balance Static Standing -  Balance Support: Bilateral upper extremity supported Static Standing - Level of Assistance: 5: Stand by assistance  End of Session PT - End of Session Equipment Utilized During Treatment: Gait belt Activity Tolerance: Patient tolerated treatment well;Patient limited by fatigue Patient left: in chair;with call bell/phone within reach Nurse Communication: Mobility status  GP     Vista Deck 04/24/2013, 11:04 AM

## 2013-04-24 NOTE — Discharge Summary (Signed)
Physician Discharge Summary  Noah Medina:[id number] DOB: 1956-02-21 DOA: 04/23/2013  PCP: Iona Hansen, NP  Admit date: 04/23/2013 Discharge date: 04/25/2013  Recommendations for Outpatient Follow-up:  1. Do not use aspirin, advil, or aleve.  He should stop drinking alcohol under the supervision of his primary care doctor.  Follow up with neurosurgery as needed 2. PCP to refer to hepatology and continue counseling EtOH cessation 3. Home health RN and PT  Discharge Diagnoses:  Principal Problem:   Fall Active Problems:   Cirrhosis of liver with history of hepatitis C   Status post repair of ventral hernia   Severe malnutrition   Normocytic anemia   Subdural hematoma   Thrombocytopenia, unspecified   Discharge Condition: stable, improved  Diet recommendation: 2g sodium   Wt Readings from Last 3 Encounters:  04/24/13 90.2 kg (198 lb 13.7 oz)  03/14/13 91.536 kg (201 lb 12.8 oz)  01/11/13 89.812 kg (198 lb)    History of present illness:   Pt is 57 yo male who presented to [facility] ED with main concern of unsteady gait, ongoing and present for several months, associated with several episodes of fall at home. He explains he has not seen any PCP for this but would like to find out the cause of this. He denies any specific focal neurological symptoms, has generalized weakness, no visual changes, no headaches, no chest pain, no shortness of breath, no specific urinary concerns. He explains he has been having some abdominal discomfort where his hernia is located. He denies nausea or vomiting, no other abdominal concerns.   In ED, pt with unsteady gait, acute on chronic subdural hematoma noted on CT head. TRH asked to admit for observation. Per ED physician, neurosurgery recommended observation.  Hospital Course:   Fall, likely related to alcohol and cocaine intoxication.  Having symptoms of alcohol withdrawal currently, alcohol level 121.  Physical therapy evaluated patient and  recommended rolling walker and home health PT.    Alcohol and cocaine abuse.  Counseled supervised cessation by slowly decreasing alcohol consumption under direction of primary care doctor as outpatient.  Stated he drank 2 beers 3 days prior to admission and none during the week prior, however, his blood alcohol level was 121 on admission.  Denied other drug use and UDS was positive for cocaine.  Counseled cessation of drugs.  Patient advised to stop drinking alcohol with the supervision of his primary care doctor.  CIWA scores have been 2-5 over the last 24 hours.    Severe malnutrition, likely secondary to cirrhosis of the liver, Hep C, and alcohol abuse.  Encouraged PO intake and ensure supplements once or twice a day at home.  Subacute subdural hematomas, likely related to episodes of fall.  Neurosurgery recommended observation of neurological status and he remained unchanged from admission without deterioration.  Patient needs to follow up with neurosurgery as needed and does not need further CT head follow up unless neurologic changes.  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  Platelets stable near 40K.  Needs outpatient hepatology follow up.    Hypokalemia, likely due to poor potassium in diet/alcohol abuse.  Repleted with oral potassium.    Transaminitis, in the pattern of alcohol use.  Pt denies EtOH and IVDA abuse and states that he had 2 beers 3 days ago and before that no alcohol in one week, however, his serum alcohol level was 121 and his drug screen was positive for cocaine.    Cirrhosis of  liver with history of hepatitis C and ongoing alcohol abuse.  Follow up with hepatology.    Status post repair of ventral hernia, placed abdominal binder and feels better.  Follow up with Dr. Rayburn Ma as recommended by Dr. Rayburn Ma.  Normocytic anemia, hgb stable from baseline.   Procedures:  CT brain  Consultations:  Neurosurgery  Discharge Exam: Filed Vitals:    04/25/13 0607  BP: 134/66  Pulse: 74  Temp: 98.5 F (36.9 C)  Resp: 18   Filed Vitals:   04/24/13 0402 04/24/13 1325 04/24/13 2140 04/25/13 0607  BP: 134/67 141/59 134/61 134/66  Pulse: 73 78 78 74  Temp: 97.9 F (36.6 C) 98.2 F (36.8 C) 98.4 F (36.9 C) 98.5 F (36.9 C)  TempSrc: Oral Oral Oral Oral  Resp: 20 18 18 18   Height: 5\' 11"  (1.803 m)     Weight: 90.2 kg (198 lb 13.7 oz)     SpO2: 100% 98% 94% 95%   Slept well and feels about the same today as he did yesterday.  General:  CM with temporal wasting, NAD, obvious tremor of extremities HEENT:  PERRL, MMM, + tongue fasciculations Cardiovascular: RRR, no mrg, not tachycardic, 2+ pulses, warm extremities Respiratory: CTAB ABD:  NABS, soft, moderately distended with fluid wave, nontender MSK:  Trace LEE bilaterally NEURO:  No focal neurologic deficits  Discharge Instructions      Discharge Orders   Future Orders Complete By Expires     Call MD for:  difficulty breathing, headache or visual disturbances  As directed     Call MD for:  extreme fatigue  As directed     Call MD for:  hives  As directed     Call MD for:  persistant dizziness or light-headedness  As directed     Call MD for:  persistant nausea and vomiting  As directed     Call MD for:  severe uncontrolled pain  As directed     Call MD for:  temperature >100.4  As directed     Diet general  As directed     Scheduling Instructions:      2 gm sodium diet if possible to minimize swelling.    Discharge instructions  As directed     Comments:      You were hospitalized with a subdural hematoma which was probably caused by falling and hitting your head.  Alcohol and cocaine use increase your risk of falling and hitting your head and your cirrhosis of the liver increases the chances of bleeding.  Please stop using drugs and stop drinking under the supervision of your primary care doctor.  Please follow up with your doctor within the next 1 week.  Do not use  aspirin, goody's powders, bc powders, naprosyn/narpoxen, aleve as these may increase your risk of bleeding.    Increase activity slowly  As directed         Medication List    TAKE these medications       folic acid 1 MG tablet  Commonly known as:  FOLVITE  Take 1 tablet (1 mg total) by mouth daily.     HYDROmorphone 4 MG tablet  Commonly known as:  DILAUDID  Take 4 mg by mouth every 6 (six) hours as needed for pain.     multivitamin with minerals Tabs  Take 1 tablet by mouth daily.     omeprazole 20 MG tablet  Commonly known as:  PRILOSEC OTC  Take 20 mg by mouth  daily.     thiamine 100 MG tablet  Take 1 tablet (100 mg total) by mouth daily.       Follow-up Information   Follow up with Iona Hansen, NP. Schedule an appointment as soon as possible for a visit in 1 week.   Contact information:   [address] [place] [address] [phone]       The results of significant diagnostics from this hospitalization (including imaging, microbiology, ancillary and laboratory) are listed below for reference.    Significant Diagnostic Studies: Ct Head Wo Contrast  04/23/2013  *RADIOLOGY REPORT*  Clinical Data: Dizziness  CT HEAD WITHOUT CONTRAST  Technique:  Contiguous axial images were obtained from the base of the skull through the vertex without contrast.  Comparison: 12/15/2012  Findings: Prominence of the sulci, cisterns, and ventricles, in keeping with diffuse cerebral volume loss.  There is an extra-axial fluid collection along the left cerebral convexity measuring up to 7 mm in thickness and isointense to brain parenchyma.  On the right, there is a 3 mm thickness extra-axial fluid collection isodense to slightly hyperdense attenuation along the right frontal convexity as seen on series 2 image 16. Mild associated mass effect however due to the volume loss, does not appear to be significant. No hydrocephalous.  No overt CT evidence for acute infarct.  No  intraparenchymal hemorrhage. The visualized paranasal sinuses and mastoid air cells are predominately clear. Mild left posterolateral scalp swelling may reflect sequelae of prior hematoma and is not favored to be acute.  No displaced calvarial fracture.  IMPRESSION: Bilateral extra-axial fluid collections as above are favored to reflect subacute left and acute on subacute right subdural hematomas.  Critical Value/emergent results were called by telephone at the time of interpretation on 04/23/2013 at 11:20 p.m. to Sharen Hones, who verbally acknowledged these results.   Original Report Authenticated By: Jearld Lesch, M.D.    Dg Abd Acute W/chest  04/23/2013  *RADIOLOGY REPORT*  Clinical Data: Abdominal pain, history of ventral hernia  ACUTE ABDOMEN SERIES (ABDOMEN 2 VIEW & CHEST 1 VIEW)  Comparison: 01/02/2014chest radiograph, 07/02/2012 CT  Findings: Heart size upper normal.  Central vascular prominence is similar to prior.  No confluent airspace opacity.  No pleural effusion or pneumothorax.  Surgical clips at the GE junction.  The bowel gas pattern is non-obstructive. Rounded calcifications projecting over the right upper quadrant are most in keeping with gallstones.  Organ outlines are otherwise normal where seen. No acute or aggressive osseous abnormality identified.  IMPRESSION: No radiographic evidence of acute cardiopulmonary process.  Surgical clips at the GE junction.  Bowel gas pattern nonobstructive.  Gallstones.   Original Report Authenticated By: Jearld Lesch, M.D.     Microbiology: Recent Results (from the past 240 hour(s))  MRSA PCR SCREENING     Status: None   Collection Time    04/24/13 11:10 AM      Result Value Range Status   MRSA by PCR NEGATIVE  NEGATIVE Final   Comment:            The GeneXpert MRSA Assay (FDA     approved for NASAL specimens     only), is one component of a     comprehensive MRSA colonization     surveillance program. It is not     intended to  diagnose MRSA     infection nor to guide or     monitor treatment for     MRSA infections.  Labs: Basic Metabolic Panel:  Recent Labs Lab 04/24/13 0045 04/24/13 0520  NA 141 142  K 3.3* 3.3*  CL 111 111  CO2 22 22  GLUCOSE 83 119*  BUN 6 7  CREATININE 1.01 1.04  CALCIUM 8.9 8.6   Liver Function Tests:  Recent Labs Lab 04/24/13 0045  AST 65*  ALT 33  ALKPHOS 77  BILITOT 1.7*  PROT 6.4  ALBUMIN 2.8*   No results found for this basename: LIPASE, AMYLASE,  in the last 168 hours  Recent Labs Lab 04/24/13 0520  AMMONIA 44   CBC:  Recent Labs Lab 04/23/13 2313 04/24/13 0110 04/24/13 0520  WBC 5.5 3.9* 3.2*  NEUTROABS 3.1 2.2  --   HGB 12.2* 11.4* 11.3*  HCT 34.4* 32.2* 32.9*  MCV 91.2 91.2 91.4  PLT 49* 43* 39*   Cardiac Enzymes: No results found for this basename: CKTOTAL, CKMB, CKMBINDEX, TROPONINI,  in the last 168 hours BNP: BNP (last 3 results) No results found for this basename: PROBNP,  in the last 8760 hours CBG: No results found for this basename: GLUCAP,  in the last 168 hours  Time coordinating discharge: 45 minutes  Signed:  Azrael Huss  [facility] 04/25/2013, 10:35 AM

## 2013-04-25 MED ORDER — FOLIC ACID 1 MG PO TABS: 1.0000 mg | ORAL_TABLET | Freq: Every day | ORAL | Status: DC

## 2013-04-25 MED ORDER — THIAMINE HCL 100 MG PO TABS: 100.0000 mg | ORAL_TABLET | Freq: Every day | ORAL | Status: DC

## 2013-04-25 NOTE — Progress Notes (Signed)
Clinical Social Work  Charity fundraiser informed CSW that patient would DC today. CSW met with patient at bedside who is agreeable to stay at shelter until he is able to afford an apartment. CSW called [employer] [employer] who reports they have male beds available. CSW provided patient with bus pass and directions on how to ride bus to shelter. CSW informed CM of shelter address for [facility] needs. CSW is signing off but available if further needs arise.  Unk Lightning, LCSW  (Coverage for [employer])

## 2013-04-25 NOTE — Progress Notes (Signed)
Received order for rw.  Will deliver to patient's room prior to d/c.

## 2013-06-05 ENCOUNTER — Telehealth (INDEPENDENT_AMBULATORY_CARE_PROVIDER_SITE_OTHER): Payer: Self-pay | Admitting: General Surgery

## 2013-06-05 NOTE — Telephone Encounter (Signed)
Dilaudid 4 mg 1 po q 6 hr prn for pain #50  With no refill per Dr Magnus Ivan . Patient will be getting less on this med per Dr Magnus Ivan.

## 2013-06-11 ENCOUNTER — Emergency Department (HOSPITAL_COMMUNITY): Payer: Medicaid Other

## 2013-06-11 ENCOUNTER — Emergency Department (HOSPITAL_COMMUNITY)
Admission: EM | Admit: 2013-06-11 | Discharge: 2013-06-11 | Disposition: A | Discharge status: Home / Self Care | Payer: Medicaid Other | Attending: Emergency Medicine | Admitting: Emergency Medicine

## 2013-06-11 ENCOUNTER — Encounter (HOSPITAL_COMMUNITY): Payer: Self-pay | Admitting: Emergency Medicine

## 2013-06-11 DIAGNOSIS — Z79899 Other long term (current) drug therapy: Secondary | ICD-10-CM | POA: Insufficient documentation

## 2013-06-11 DIAGNOSIS — Z8719 Personal history of other diseases of the digestive system: Secondary | ICD-10-CM | POA: Insufficient documentation

## 2013-06-11 DIAGNOSIS — R112 Nausea with vomiting, unspecified: Secondary | ICD-10-CM | POA: Insufficient documentation

## 2013-06-11 DIAGNOSIS — K219 Gastro-esophageal reflux disease without esophagitis: Secondary | ICD-10-CM | POA: Insufficient documentation

## 2013-06-11 DIAGNOSIS — Z8619 Personal history of other infectious and parasitic diseases: Secondary | ICD-10-CM | POA: Insufficient documentation

## 2013-06-11 DIAGNOSIS — F17200 Nicotine dependence, unspecified, uncomplicated: Secondary | ICD-10-CM | POA: Insufficient documentation

## 2013-06-11 DIAGNOSIS — Z8711 Personal history of peptic ulcer disease: Secondary | ICD-10-CM | POA: Insufficient documentation

## 2013-06-11 DIAGNOSIS — R109 Unspecified abdominal pain: Secondary | ICD-10-CM | POA: Insufficient documentation

## 2013-06-11 LAB — CBC WITH DIFFERENTIAL/PLATELET
Basophils Absolute: 0 10*3/uL (ref 0.0–0.1)
Eosinophils Absolute: 0.1 10*3/uL (ref 0.0–0.7)
Eosinophils Relative: 1 % (ref 0–5)
Lymphs Abs: 0.8 10*3/uL (ref 0.7–4.0)
MCH: 32.2 pg (ref 26.0–34.0)
MCV: 92.5 fL (ref 78.0–100.0)
Platelets: 52 10*3/uL — ABNORMAL LOW (ref 150–400)
RDW: 15.7 % — ABNORMAL HIGH (ref 11.5–15.5)

## 2013-06-11 LAB — URINE MICROSCOPIC-ADD ON

## 2013-06-11 LAB — URINALYSIS, ROUTINE W REFLEX MICROSCOPIC
Ketones, ur: NEGATIVE mg/dL
Nitrite: NEGATIVE
pH: 5.5 (ref 5.0–8.0)

## 2013-06-11 LAB — COMPREHENSIVE METABOLIC PANEL
Albumin: 3.2 g/dL — ABNORMAL LOW (ref 3.5–5.2)
BUN: 11 mg/dL (ref 6–23)
Chloride: 108 mEq/L (ref 96–112)
Creatinine, Ser: 1.01 mg/dL (ref 0.50–1.35)
GFR calc Af Amer: >90 mL/min (ref >90)
GFR calc non Af Amer: 81 mL/min — ABNORMAL LOW (ref >90)
Glucose, Bld: 137 mg/dL — ABNORMAL HIGH (ref 70–99)
Total Bilirubin: 1.7 mg/dL — ABNORMAL HIGH (ref 0.3–1.2)

## 2013-06-11 LAB — LIPASE, BLOOD: Lipase: 31 U/L (ref 11–59)

## 2013-06-11 LAB — POCT I-STAT, CHEM 8
Creatinine, Ser: 1.2 mg/dL (ref 0.50–1.35)
HCT: 39 % (ref 39.0–52.0)
Hemoglobin: 13.3 g/dL (ref 13.0–17.0)
Potassium: 3.7 mEq/L (ref 3.5–5.1)
Sodium: 145 mEq/L (ref 135–145)

## 2013-06-11 MED ORDER — OXYCODONE HCL 5 MG PO TABS: 5.0000 mg | ORAL_TABLET | ORAL | Status: DC | PRN

## 2013-06-11 MED ORDER — HYDROMORPHONE HCL PF 1 MG/ML IJ SOLN
1.0000 mg | Freq: Once | INTRAMUSCULAR | Status: AC
  Administered 2013-06-11: 1 mg via INTRAVENOUS
  Filled 2013-06-11: qty 1

## 2013-06-11 MED ORDER — SODIUM CHLORIDE 0.9 % IV SOLN
INTRAVENOUS | Status: DC
  Administered 2013-06-11: 125 mL/h via INTRAVENOUS
  Administered 2013-06-11: 08:00:00 via INTRAVENOUS

## 2013-06-11 MED ORDER — IOHEXOL 300 MG/ML  SOLN
50.0000 mL | Freq: Once | INTRAMUSCULAR | Status: AC | PRN
  Administered 2013-06-11: 50 mL via ORAL

## 2013-06-11 MED ORDER — ONDANSETRON 8 MG PO TBDP: 8.0000 mg | ORAL_TABLET | Freq: Three times a day (TID) | ORAL | Status: DC | PRN

## 2013-06-11 MED ORDER — ONDANSETRON HCL 4 MG/2ML IJ SOLN
4.0000 mg | Freq: Once | INTRAMUSCULAR | Status: AC
  Administered 2013-06-11: 4 mg via INTRAVENOUS
  Filled 2013-06-11: qty 2

## 2013-06-11 MED ORDER — KETOROLAC TROMETHAMINE 30 MG/ML IJ SOLN: 30.0000 mg | Freq: Once | INTRAMUSCULAR | Status: DC

## 2013-06-11 MED ORDER — OXYCODONE-ACETAMINOPHEN 5-325 MG PO TABS
2.0000 | ORAL_TABLET | Freq: Once | ORAL | Status: AC
  Administered 2013-06-11: 2 via ORAL
  Filled 2013-06-11: qty 2

## 2013-06-11 MED ORDER — OXYCODONE-ACETAMINOPHEN 5-325 MG PO TABS: 1.0000 | ORAL_TABLET | Freq: Four times a day (QID) | ORAL | Status: DC | PRN

## 2013-06-11 MED ORDER — IOHEXOL 300 MG/ML  SOLN
100.0000 mL | Freq: Once | INTRAMUSCULAR | Status: AC | PRN
  Administered 2013-06-11: 100 mL via INTRAVENOUS

## 2013-06-11 NOTE — ED Notes (Signed)
Patient ambulating to the BR with family assist. Stated "that percocet worked good."

## 2013-06-11 NOTE — ED Notes (Signed)
Pt aware of the need for a urine sample, urinal at bedside. 

## 2013-06-11 NOTE — ED Notes (Signed)
Patient transported to CT 

## 2013-06-11 NOTE — ED Provider Notes (Signed)
History    CSN: [id number] Arrival date & time 06/11/13  4098  First MD Initiated Contact with Patient 06/11/13 0602     Chief Complaint  Patient presents with  . Abdominal Pain  . Nausea  . Emesis   (Consider location/radiation/quality/duration/timing/severity/associated sxs/prior Treatment) HPI Comments: Pt with h/o alcohol use, cirrhosis, ventral hernia s/p repair, subdural hematoma 2/2 falls -- presents with acute onset of generalized abdominal pain at 2 AM today. It has been associated with vomiting. Patient had one bowel movement that was normal. No fever, cold symptoms, chest pain, shortness of breath, urinary symptoms. Patient denies alcohol use saying "it's not enough to say that I drink". He denies heavy NSAID use. States that he has an ultrasound scheduled tomorrow to look for gallstones. No treatments prior to arrival. Course is constant. Palpation makes pain worse. Nothing makes it better. Substance reporting database shows chronic pain medication from Dr. Magnus Ivan, #80 hydromorphone 4 mg tablets filled 05/27.   Patient is a 57 y.o. male presenting with abdominal pain and vomiting. The history is provided by the patient and medical records.  Abdominal Pain Associated symptoms include abdominal pain, nausea and vomiting. Pertinent negatives include no chest pain, coughing, fever, headaches, myalgias, rash or sore throat.  Emesis Associated symptoms: abdominal pain   Associated symptoms: no diarrhea, no headaches, no myalgias and no sore throat    Past Medical History  Diagnosis Date  . Cirrhosis of liver   . Hepatitis C 03-28-12    ? 80's-prev. IV drug abuse  . 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 Blood transfusion 03-28-12    '81-hx. of bleeding ulcer  . History of bleeding ulcers 03-28-12    Hx. '81  . GERD (gastroesophageal reflux disease) 03-26-12    Hx. reflux. ulcers in past, occ. OTC med  . Incisional hernia     has drainage system from  abdomin   Past Surgical History  Procedure Laterality Date  . Umbilical hernia repair    . Hernia repair  08/24/11    ventral hernia with mesh  . Hernia repair  11'11    Umbilical  . Peptic ulcer  03-26-12    open peptic ulcer surgery repair- '81  . Incisional hernia repair  03/26/2012  . Incisional hernia repair  03/30/2012    Procedure: LAPAROSCOPIC INCISIONAL HERNIA;  Surgeon: Shelly Rubenstein, MD;  Location: [facility] ORS;  Service: General;  Laterality: N/A;  Laparoscopic Incisional/ventral Hernia Repair with Mesh  . Tonsillectomy    . Appendectomy    . Incisional hernia repair  07/20/2012    mesh  . Incisional hernia repair  07/20/2012    Procedure: HERNIA REPAIR INCISIONAL;  Surgeon: Shelly Rubenstein, MD;  Location: [facility] OR;  Service: General;  Laterality: N/A;  repair of incisional hernia with Biologic mesh   Family History  Problem Relation Age of Onset  . Cancer Mother     breast  . Cancer Father     brain  . Heart disease Brother   . Cancer Sister     breast  . Cancer Sister     breast   History  Substance Use Topics  . Smoking status: Current Every Day Smoker -- 0.25 packs/day for 38 years    Types: Cigarettes  . Smokeless tobacco: Never Used     Comment: down to 2 cigarettes per day  . Alcohol Use: No     Comment: past hx. recreational drinking- none in 16 yrs.  drinks occasionally, last drink Saturday, 2 beers and before that the weekend before.    Review of Systems  Constitutional: Negative for fever.  HENT: Negative for sore throat and rhinorrhea.   Eyes: Negative for redness.  Respiratory: Negative for cough.   Cardiovascular: Negative for chest pain.  Gastrointestinal: Positive for nausea, vomiting and abdominal pain. Negative for diarrhea.  Genitourinary: Negative for dysuria.  Musculoskeletal: Negative for myalgias.  Skin: Negative for rash.  Neurological: Negative for headaches.    Allergies  Review of patient's allergies indicates no known  allergies.  Home Medications   Current Outpatient Rx  Name  Route  Sig  Dispense  Refill  . folic acid (FOLVITE) 1 MG tablet   Oral   Take 1 tablet (1 mg total) by mouth daily.   30 tablet   0   . HYDROmorphone (DILAUDID) 4 MG tablet   Oral   Take 4 mg by mouth every 6 (six) hours as needed for pain.         . Multiple Vitamin (MULITIVITAMIN WITH MINERALS) TABS   Oral   Take 1 tablet by mouth daily.         Marland Kitchen omeprazole (PRILOSEC OTC) 20 MG tablet   Oral   Take 20 mg by mouth daily.         Marland Kitchen thiamine 100 MG tablet   Oral   Take 1 tablet (100 mg total) by mouth daily.   30 tablet   0    BP 151/71  Pulse 76  Temp(Src) 97.6 F (36.4 C) (Oral)  Resp 18  Ht 5\' 11"  (1.803 m)  Wt 200 lb (90.719 kg)  BMI 27.91 kg/m2  SpO2 100% Physical Exam  Nursing note and vitals reviewed. Constitutional: He appears well-developed and well-nourished.  HENT:  Head: Normocephalic and atraumatic.  Eyes: Conjunctivae are normal. Right eye exhibits no discharge. Left eye exhibits no discharge.  Neck: Normal range of motion. Neck supple.  Cardiovascular: Normal rate, regular rhythm and normal heart sounds.   Pulmonary/Chest: Effort normal and breath sounds normal.  Abdominal: Soft. Bowel sounds are normal. He exhibits no distension. There is tenderness. There is no rebound and no guarding.  Multiple healed surgical scars on abdomen. Tenderness is moderate, generalized.   Neurological: He is alert.  Skin: Skin is warm and dry.  Psychiatric: He has a normal mood and affect.    ED Course  Procedures (including critical care time) Labs Reviewed  CBC WITH DIFFERENTIAL - Abnormal; Notable for the following:    RBC 3.98 (*)    Hemoglobin 12.8 (*)    HCT 36.8 (*)    RDW 15.7 (*)    Platelets 52 (*)    All other components within normal limits  COMPREHENSIVE METABOLIC PANEL - Abnormal; Notable for the following:    Glucose, Bld 137 (*)    Albumin 3.2 (*)    AST 70 (*)    Total  Bilirubin 1.7 (*)    GFR calc non Af Amer 81 (*)    All other components within normal limits  POCT I-STAT, CHEM 8 - Abnormal; Notable for the following:    Glucose, Bld 133 (*)    Calcium, Ion 1.06 (*)    All other components within normal limits  LIPASE, BLOOD  URINALYSIS, ROUTINE W REFLEX MICROSCOPIC   Ct Abdomen Pelvis W Contrast  06/11/2013   *RADIOLOGY REPORT*  Clinical Data: Abdominal pain and vomiting.History of hepatitis C.  CT ABDOMEN AND PELVIS WITH CONTRAST  Technique:  Multidetector CT imaging of the abdomen and pelvis was performed following the standard protocol during bolus administration of intravenous contrast.  Contrast: OMNIPAQUE IOHEXOL 300 MG/ML  SOLN  Comparison: 07/02/2012  Findings: Dependent atelectasis at the lung bases.  Postoperative changes in the distal esophagus are noted.  Small hiatal hernia is present.  Oral contrast is seen in the stomach as well as the distal esophagus.  Stomach is moderately distended.  There is no disproportionate dilatation of small bowel or a transition zone to suggest bowel obstruction.  Moderate stool burden within the ascending and transverse colon.  Distal colon is decompressed.  The liver is stable in appearance with prominence of the left lobe and a nodular contour.  This is compatible with cirrhosis.  No definite focal mass.  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  Cholelithiasis.  Mild splenomegaly is stable.  Ventral hernia containing adipose tissue is present.  Below the hernia, there is outward bulging of the abdominal wall which is either allograft or mesh.  There is a 3.9 x 6.9 x 5.6 cm heterogeneous soft tissue mass with its epicenter in the right lower anterior abdominal wall in the location of the hernia repair.  This may represent a hematoma.  It may also represent  postoperative scarring with or without adjacent small bowel loops.  There is a small sliver of low density anterior to this mass which may represent a small amount of loculated pleural fluid or chronic postoperative change.  Left inguinal hernia contains adipose tissue.  No free fluid layering the pelvis.  Mild atherosclerotic changes of the iliac arteries.  Degenerative changes in the lumbar spine are present.  Unilateral left L5 pars defect.  IMPRESSION: Cholelithiasis.  Status post abdominal hernia repair as described.  There is a 6.9 cm soft tissue mass within the region of the right lower abdominal hernia mesh.  This may represent an acute hematoma or simply postoperative scarring.  A sliver of low density anterior to the mass may represent a small amount of loculated free fluid.  Cirrhotic liver with a large varices.  Hiatal hernia  Mild splenomegaly.   Original Report Authenticated By: Jolaine Click, M.D.   Dg Abd Acute W/chest  06/11/2013   *RADIOLOGY REPORT*  Clinical Data: Abdominal pain  ACUTE ABDOMEN SERIES (ABDOMEN 2 VIEW & CHEST 1 VIEW)  Comparison: 04/23/2013  Findings: Normal heart size.  Lungs are under aerated and clear.  Postoperative changes are present at the gastroesophageal junction. There is no free intraperitoneal gas.  No disproportionate dilatation of bowel.  Prominent stool burden in the colon.  Small gallstones are suspected.  IMPRESSION: No active cardiopulmonary disease.  Nonobstructive bowel gas pattern.  Cholelithiasis.   Original Report Authenticated By: Jolaine Click, M.D.   1. Abdominal pain     6:06 AM Patient seen and examined. Work-up initiated. Medications ordered.   Vital signs reviewed and are as follows: Filed Vitals:   06/11/13 0545  BP: 135/54  Pulse: 100  Temp: 98.8 F (37.1 C)  Resp: 22   9:21 AM Patient seen by Dr. Patria Mane previously and CT was ordered.   CT reviewed by myself. Pt informed of results.   Patient notes relief of pain. 6.9cm soft tissue  mass corresponds to firm area on abdomen that patient states is not new. He does not have localized tenderness in this area and I  believe this is scarring.   Will give patient something to eat and likely d/c to home.   10:37 AM Pain got worse after eating part of a [place] sandwich. PO percocet given. Will reassess.   11:15 AM Patient feels well after taking the percocet. He is ambulating in hallway. Family now at bedside. She explained tremors are hereditary and are worse when he is cold or in pain.   Patient has eaten and not vomited. He appears well. On reexam he has minimal tenderness and abdomen. He is comfortable with discharge home.  The patient was urged to return to the Emergency Department immediately with worsening of current symptoms, worsening abdominal pain, persistent vomiting, blood noted in stools, fever, or any other concerns. The patient verbalized understanding.   Patient counseled on use of narcotic pain medications. Counseled not to combine these medications with others containing tylenol. Urged not to drink alcohol, drive, or perform any other activities that requires focus while taking these medications. The patient verbalizes understanding and agrees with the plan.  Patient to followup with his primary care physician as planned for evaluation of gallstones.   MDM  Patient with nonspecific abdominal pain. Labs are reassuring. CT scan does not demonstrate any obstruction or infection. Pain is improved in emergency department. Pain is potentially being caused by gallstones. Given CT appearance of the gallbladder do not suspect any infection at this time. Patient does not have fever or any other systemic symptoms. He does not have an increased white count. Patient appears reliable to return if worsening. He has close primary care physician followup and he wants to go home.  Renne Crigler, PA-C 06/11/13 1118

## 2013-06-11 NOTE — ED Provider Notes (Signed)
Medical screening examination/treatment/procedure(s) were conducted as a shared visit with non-physician practitioner(s) and myself.  I personally evaluated the patient during the encounter   Patient is overall well-appearing.  We will obtain a CT scan to evaluate further regarding the cause of his abdominal pain.  Pain treated   Lyanne Co, MD 06/11/13 [phone]

## 2013-06-11 NOTE — ED Notes (Signed)
Patient ate about 1/2 of a sandwich- tolerated well. Did complain of heartburn which subsided after drinking some milk.

## 2013-06-11 NOTE — ED Notes (Signed)
Patient has significant other here that states that patient is shaking more than normal. 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 PA in to eval patient

## 2013-06-11 NOTE — ED Notes (Signed)
Per EMS, pt. From home with complaint of abdominal pain with nausea and vomiting , pt. Claimed of having dry hives since last night . Pt. Has hx of gall stones . No SOB.

## 2013-06-11 NOTE — ED Notes (Signed)
Patient transported to X-ray 

## 2013-06-12 ENCOUNTER — Telehealth (INDEPENDENT_AMBULATORY_CARE_PROVIDER_SITE_OTHER): Payer: Self-pay | Admitting: *Deleted

## 2013-06-12 ENCOUNTER — Ambulatory Visit
Admission: RE | Admit: 2013-06-12 | Discharge: 2013-06-12 | Disposition: A | Discharge status: Home / Self Care | Payer: Medicaid Other | Source: Ambulatory Visit | Attending: Internal Medicine | Admitting: Internal Medicine

## 2013-06-12 DIAGNOSIS — B1920 Unspecified viral hepatitis C without hepatic coma: Secondary | ICD-10-CM

## 2013-06-12 NOTE — Telephone Encounter (Signed)
Patient came into the office this morning stating that the Dilaudid is no longer controlling his pain.  Patient states he had to go to the ED on 6/29 for pain control.  Patient states he was given Oxycodone IR 5mg  which is really helping and asking for a prescription for this medication.  Patient was given a prescription for Dilaudid 4mg  #50 on 6/23.  This RN spoke to Dr. Magnus Ivan who states that at this time he does not feel comfortable writing out a prescription for the Oxycodone due to the recent Dilaudid prescription and also a recent drug screen came back positive for cocaine for patient.  This was explained to patient at this time.  Patient asking if Dr. Magnus Ivan would ever prescribe this for him in the future and states he understands his decision at this time.  Explained to patient that this RN can send a message to Dr. Magnus Ivan asking about the future however at this time he will not receive an additional prescription.

## 2013-06-13 LAB — URINE CULTURE
Colony Count: NO GROWTH
Culture: NO GROWTH

## 2013-06-29 ENCOUNTER — Telehealth (INDEPENDENT_AMBULATORY_CARE_PROVIDER_SITE_OTHER): Payer: Self-pay | Admitting: *Deleted

## 2013-06-29 NOTE — Telephone Encounter (Signed)
Spoke to [place] about patient coming into office asking for pain medications.  Pattricia Boss states the same as what my previous message had stated that due to patient's UDS being positive for cocaine, Dr. Magnus Ivan does not feel comfortable writing narcotic pain medication prescriptions for patient.  This was again explained to the patient at this time who just said OK and hung up.

## 2013-06-29 NOTE — Telephone Encounter (Signed)
Patient has walked into the office at this time asking for a new prescription for his pain medication.  Patient states the Dilaudid is making him sick still.  Explained to patient that Dr. Magnus Ivan is unavailable this week so this RN will have to speak with the urgent office MD when he arrives this afternoon and we can call patient with the decision.  Patient states understanding at this time.

## 2013-08-13 ENCOUNTER — Emergency Department (HOSPITAL_BASED_OUTPATIENT_CLINIC_OR_DEPARTMENT_OTHER): Payer: Medicaid Other

## 2013-08-13 ENCOUNTER — Encounter (HOSPITAL_BASED_OUTPATIENT_CLINIC_OR_DEPARTMENT_OTHER): Payer: Self-pay | Admitting: *Deleted

## 2013-08-13 ENCOUNTER — Emergency Department (HOSPITAL_BASED_OUTPATIENT_CLINIC_OR_DEPARTMENT_OTHER)
Admission: EM | Admit: 2013-08-13 | Discharge: 2013-08-14 | Disposition: A | Discharge status: Home / Self Care | Payer: Medicaid Other | Attending: Emergency Medicine | Admitting: Emergency Medicine

## 2013-08-13 DIAGNOSIS — Z79899 Other long term (current) drug therapy: Secondary | ICD-10-CM | POA: Insufficient documentation

## 2013-08-13 DIAGNOSIS — F10929 Alcohol use, unspecified with intoxication, unspecified: Secondary | ICD-10-CM

## 2013-08-13 DIAGNOSIS — R55 Syncope and collapse: Secondary | ICD-10-CM | POA: Insufficient documentation

## 2013-08-13 DIAGNOSIS — F1010 Alcohol abuse, uncomplicated: Secondary | ICD-10-CM | POA: Insufficient documentation

## 2013-08-13 DIAGNOSIS — Z8619 Personal history of other infectious and parasitic diseases: Secondary | ICD-10-CM | POA: Insufficient documentation

## 2013-08-13 DIAGNOSIS — F17200 Nicotine dependence, unspecified, uncomplicated: Secondary | ICD-10-CM | POA: Insufficient documentation

## 2013-08-13 DIAGNOSIS — R42 Dizziness and giddiness: Secondary | ICD-10-CM | POA: Insufficient documentation

## 2013-08-13 DIAGNOSIS — Z8719 Personal history of other diseases of the digestive system: Secondary | ICD-10-CM | POA: Insufficient documentation

## 2013-08-13 DIAGNOSIS — K219 Gastro-esophageal reflux disease without esophagitis: Secondary | ICD-10-CM | POA: Insufficient documentation

## 2013-08-13 HISTORY — DX: Traumatic subdural hemorrhage with loss of consciousness of unspecified duration, initial encounter: S06.5X9A

## 2013-08-13 LAB — RAPID URINE DRUG SCREEN, HOSP PERFORMED
Amphetamines: NOT DETECTED
Benzodiazepines: NOT DETECTED
Opiates: NOT DETECTED
Tetrahydrocannabinol: NOT DETECTED

## 2013-08-13 LAB — COMPREHENSIVE METABOLIC PANEL
ALT: 34 U/L (ref 0–53)
Albumin: 3.1 g/dL — ABNORMAL LOW (ref 3.5–5.2)
Alkaline Phosphatase: 85 U/L (ref 39–117)
BUN: 10 mg/dL (ref 6–23)
Chloride: 105 mEq/L (ref 96–112)
Glucose, Bld: 81 mg/dL (ref 70–99)
Potassium: 3.8 mEq/L (ref 3.5–5.1)
Sodium: 138 mEq/L (ref 135–145)
Total Bilirubin: 1.5 mg/dL — ABNORMAL HIGH (ref 0.3–1.2)
Total Protein: 6.9 g/dL (ref 6.0–8.3)

## 2013-08-13 LAB — CBC WITH DIFFERENTIAL/PLATELET
Hemoglobin: 12.4 g/dL — ABNORMAL LOW (ref 13.0–17.0)
Lymphs Abs: 1.4 10*3/uL (ref 0.7–4.0)
Monocytes Relative: 11 % (ref 3–12)
Neutro Abs: 2.1 10*3/uL (ref 1.7–7.7)
Neutrophils Relative %: 51 % (ref 43–77)
Platelets: 56 10*3/uL — ABNORMAL LOW (ref 150–400)
RBC: 3.69 MIL/uL — ABNORMAL LOW (ref 4.22–5.81)
WBC: 4.2 10*3/uL (ref 4.0–10.5)

## 2013-08-13 LAB — PROTIME-INR
INR: 1.43 (ref 0.00–1.49)
Prothrombin Time: 17.1 seconds — ABNORMAL HIGH (ref 11.6–15.2)

## 2013-08-13 LAB — ETHANOL: Alcohol, Ethyl (B): 327 mg/dL — ABNORMAL HIGH (ref 0–11)

## 2013-08-13 LAB — TROPONIN I: Troponin I: <0.3 ng/mL (ref <0.30)

## 2013-08-13 LAB — APTT: aPTT: 36 seconds (ref 24–37)

## 2013-08-13 MED ORDER — TRAMADOL HCL 50 MG PO TABS: 50.0000 mg | ORAL_TABLET | Freq: Four times a day (QID) | ORAL | Status: DC | PRN

## 2013-08-13 MED ORDER — SODIUM CHLORIDE 0.9 % IV BOLUS (SEPSIS)
500.0000 mL | Freq: Once | INTRAVENOUS | Status: AC
  Administered 2013-08-13: 500 mL via INTRAVENOUS

## 2013-08-13 MED ORDER — MORPHINE SULFATE 4 MG/ML IJ SOLN
4.0000 mg | Freq: Once | INTRAMUSCULAR | Status: AC
  Administered 2013-08-13: 4 mg via INTRAVENOUS
  Filled 2013-08-13: qty 1

## 2013-08-13 NOTE — ED Notes (Signed)
Found patient attempting to get out of bed.  Educated patient on use of call light.  Monitoring closely.  Other nursing staff made aware to assist with monitoring.

## 2013-08-13 NOTE — ED Notes (Addendum)
EMS transport from home- pt reports having syncopal episodes the last few nights. Felt like it was going to happen again today so called EMS- Etoh noted, admits to 4-5 beers today- heavy drinker- CBG 83 per ems

## 2013-08-13 NOTE — ED Notes (Signed)
Pt here c/o intermittent syncope.

## 2013-08-13 NOTE — ED Provider Notes (Signed)
This chart was scribed for Noah Maw Deshea Pooley, DO by Leone Payor, ED Scribe. This patient was seen in room MH12/MH12 and the patient's care was started 7:13 PM.  TIME SEEN: 7:13 PM   CHIEF COMPLAINT: near syncope  HPI: HPI Comments: Noah Medina is a 57 y.o. male brought in by ambulance, who presents to the Emergency Department complaining of a new episode of near syncope and that occurred today with the last episode of syncope occuring 2 days ago. Pt states he has dizziness prior to having these episodes that he describes as vertigo. Pt reports having these episodes for about 6 months and they last about 15-20 minutes at a time. Pt states he hit his head after which he was admitted to the hospital and diagnosed with a subdural hematoma about 6 months ago. Pt states he was told he would have these episodes of syncope due to the subdural hematoma. He denies chest pain, palpitations, SOB, numbness or tingling, focal weakness. Patient does report drinking alcohol occasionally. Denies any drug use. He denies any vertiginous symptoms currently. He states when he does have his vertigo is worse with change of position and moving his head. He denies any headache with his vertiginous episodes or other neurologic deficit.   ROS: See HPI Constitutional: no fever  Eyes: no drainage  ENT: no runny nose   Cardiovascular:  no chest pain, palpitations Resp: no SOB  GI: no vomiting GU: no dysuria Integumentary: no rash  Allergy: no hives  Musculoskeletal: no leg swelling  Neurological: no slurred speech, numbness or tingling ROS otherwise negative  PAST MEDICAL HISTORY/PAST SURGICAL HISTORY:  Past Medical History  Diagnosis Date  . Cirrhosis of liver   . Hepatitis C 03-28-12    ? 80's-prev. IV drug abuse  . 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 Blood transfusion 03-28-12    '81-hx. of bleeding ulcer  . History of bleeding ulcers 03-28-12    Hx. '81  . GERD (gastroesophageal  reflux disease) 03-26-12    Hx. reflux. ulcers in past, occ. OTC med  . Incisional hernia     has drainage system from abdomin  . Subdural hematoma     MEDICATIONS:  Prior to Admission medications   Medication Sig Start Date End Date Taking? Authorizing Provider  folic acid (FOLVITE) 1 MG tablet Take 1 tablet (1 mg total) by mouth daily. 04/25/13   Renae Fickle, MD  HYDROmorphone (DILAUDID) 4 MG tablet Take 4 mg by mouth every 6 (six) hours as needed for pain.    Historical Provider, MD  Multiple Vitamin (MULITIVITAMIN WITH MINERALS) TABS Take 1 tablet by mouth daily.    Historical Provider, MD  omeprazole (PRILOSEC OTC) 20 MG tablet Take 20 mg by mouth daily.    Historical Provider, MD  ondansetron (ZOFRAN ODT) 8 MG disintegrating tablet Take 1 tablet (8 mg total) by mouth every 8 (eight) hours as needed for nausea. 06/11/13   Renne Crigler, PA-C  oxyCODONE (OXY IR/ROXICODONE) 5 MG immediate release tablet Take 1 tablet (5 mg total) by mouth every 4 (four) hours as needed for pain. 06/11/13   Renne Crigler, PA-C  spironolactone (ALDACTONE) 25 MG tablet Take 25 mg by mouth daily.    Historical Provider, MD  thiamine 100 MG tablet Take 1 tablet (100 mg total) by mouth daily. 04/25/13   Renae Fickle, MD    ALLERGIES:  No Known Allergies  SOCIAL HISTORY:  History  Substance Use Topics  . Smoking  status: Current Every Day Smoker -- 0.25 packs/day for 38 years    Types: Cigarettes  . Smokeless tobacco: Never Used  . Alcohol Use: 21.0 oz/week    35 Cans of beer per week     Comment: Heavy- admits to 5 beers a day    FAMILY HISTORY: Family History  Problem Relation Age of Onset  . Cancer Mother     breast  . Cancer Father     brain  . Heart disease Brother   . Cancer Sister     breast  . Cancer Sister     breast    EXAM: BP 138/62  Pulse 88  Temp(Src) 99.1 F (37.3 C) (Oral)  Resp 22  SpO2 96% CONSTITUTIONAL: Alert and oriented and responds appropriately to questions.  Well-appearing; well-nourished. Smells like alcohol and poor hygiene.  HEAD: Normocephalic, patient does have ecchymosis around his left eye without swelling EYES: Conjunctivae clear, PERRL. Nystagmus. Ecchymosis surrounding left eye. Extraocular movements intact ENT: normal nose; no rhinorrhea; moist mucous membranes; pharynx without lesions noted NECK: Supple, no meningismus, no LAD  CARD: RRR; S1 and S2 appreciated; no murmurs, no clicks, no rubs, no gallops RESP: Normal chest excursion without splinting or tachypnea; breath sounds clear and equal bilaterally; no wheezes, no rhonchi, no rales,  ABD/GI: Normal bowel sounds; non-distended; soft, non-tender, no rebound, no guarding. Ventral ab hernia easily red BACK:  The back appears normal and is non-tender to palpation, there is no CVA tenderness EXT: Normal ROM in all joints; non-tender to palpation; mild peripheral edema in bilateral lower extremities; normal capillary refill; no cyanosis.  SKIN: Normal color for age and race; warm NEURO: Moves all extremities equally; strength 5/5 in all 4 extremities, no dysmetria to finger-nose testing, sensation to light touch intact diffusely, cranial nerves II through XII intact PSYCH: The patient's mood and manner are appropriate. Grooming and personal hygiene are appropriate.  MEDICAL DECISION MAKING: Patient with multiple syncopal episodes over the past 6 months. They do not appear to be cardiogenic in nature. He states that he has a vertigo no with these episodes. Patient appears intoxicated in the emergency department and I suspect that his vertigo and syncopal event or related to his alcohol abuse. He has no cardiac risk factors other than tobacco use and age. Will obtain basic labs, troponin, chest x-ray, CT head and cervical spine. EKG shows no arrhythmia or ischemic changes. We'll give IV fluids. If workup negative, anticipate discharge home. Patient does have a primary care physician. I am not  concerned his syncopal episodes are secondary to a cardiogenic cause at this point. Also unlikely to be caused by pulmonary embolus given patient has no chest pain or shortness of breath. Also no tachycardia or hypoxia. Unlikely intracranial hemorrhage given no headache or current neurologic deficit.   Date: 08/13/2013 9:06 PM  Rate: 88  Rhythm: normal sinus rhythm  QRS Axis: normal  Intervals: normal  ST/T Wave abnormalities: normal  Conduction Disutrbances: none  Narrative Interpretation: unremarkable; no arrhythmia, no ST changes     ED PROGRESS:  7:23 PM Discussed treatment plan with pt at bedside and pt agreed to plan.   9:06 PM  Patient has mild elevation of his LFTs consistent with alcohol abuse. Cardiac enzymes are negative. EKG shows no ischemic changes. Chest x-ray clear. Head and cervical spine CTs are negative. Patient's alcohol level is greater than 300. I suspect his episodes of syncope and vertigo are due to his heavy drinking. Will continue to  observe until patient is clinically sober.  10:09 PM  Pt is starting to have mild tremors. He still hemodynamically stable. Suspect he is beginning to withdraw. He is not interested in detox today. We'll discharge home with PCP followup, return precautions. Advised patient to stop drinking and we'll discharge home with a substance abuse program information.  Will have cab take patient home.  I personally performed the services described in this documentation, which was scribed in my presence. The recorded information has been reviewed and is accurate.    Noah Maw Loan Oguin, DO 08/13/13 2334

## 2013-08-13 NOTE — ED Notes (Signed)
Pt also has bruising to left nipple area and midsternal area.  Pt has the smell of ETOH and is disheveled.  Noted to be incontinent of urine on his clothing PTA.

## 2013-08-15 ENCOUNTER — Other Ambulatory Visit: Payer: Self-pay

## 2013-08-15 ENCOUNTER — Emergency Department (HOSPITAL_COMMUNITY): Payer: Medicaid Other

## 2013-08-15 ENCOUNTER — Encounter (HOSPITAL_COMMUNITY): Payer: Self-pay

## 2013-08-15 ENCOUNTER — Inpatient Hospital Stay (HOSPITAL_COMMUNITY)
Admission: EM | Admit: 2013-08-15 | Discharge: 2013-08-20 | DRG: 896 | Disposition: A | Discharge status: Home Health | Payer: Medicaid Other | Attending: Internal Medicine | Admitting: Internal Medicine

## 2013-08-15 DIAGNOSIS — S0180XA Unspecified open wound of other part of head, initial encounter: Secondary | ICD-10-CM | POA: Diagnosis present

## 2013-08-15 DIAGNOSIS — R55 Syncope and collapse: Secondary | ICD-10-CM

## 2013-08-15 DIAGNOSIS — J4489 Other specified chronic obstructive pulmonary disease: Secondary | ICD-10-CM | POA: Diagnosis present

## 2013-08-15 DIAGNOSIS — F17200 Nicotine dependence, unspecified, uncomplicated: Secondary | ICD-10-CM | POA: Diagnosis present

## 2013-08-15 DIAGNOSIS — F1010 Alcohol abuse, uncomplicated: Secondary | ICD-10-CM

## 2013-08-15 DIAGNOSIS — W19XXXD Unspecified fall, subsequent encounter: Secondary | ICD-10-CM

## 2013-08-15 DIAGNOSIS — R5381 Other malaise: Secondary | ICD-10-CM | POA: Diagnosis present

## 2013-08-15 DIAGNOSIS — R630 Anorexia: Secondary | ICD-10-CM | POA: Diagnosis present

## 2013-08-15 DIAGNOSIS — Z8719 Personal history of other diseases of the digestive system: Secondary | ICD-10-CM

## 2013-08-15 DIAGNOSIS — R109 Unspecified abdominal pain: Secondary | ICD-10-CM

## 2013-08-15 DIAGNOSIS — D72819 Decreased white blood cell count, unspecified: Secondary | ICD-10-CM | POA: Diagnosis present

## 2013-08-15 DIAGNOSIS — J449 Chronic obstructive pulmonary disease, unspecified: Secondary | ICD-10-CM | POA: Diagnosis present

## 2013-08-15 DIAGNOSIS — K7460 Unspecified cirrhosis of liver: Secondary | ICD-10-CM

## 2013-08-15 DIAGNOSIS — R7402 Elevation of levels of lactic acid dehydrogenase (LDH): Secondary | ICD-10-CM | POA: Diagnosis present

## 2013-08-15 DIAGNOSIS — G250 Essential tremor: Secondary | ICD-10-CM | POA: Diagnosis present

## 2013-08-15 DIAGNOSIS — D696 Thrombocytopenia, unspecified: Secondary | ICD-10-CM

## 2013-08-15 DIAGNOSIS — R42 Dizziness and giddiness: Secondary | ICD-10-CM | POA: Diagnosis present

## 2013-08-15 DIAGNOSIS — R7401 Elevation of levels of liver transaminase levels: Secondary | ICD-10-CM

## 2013-08-15 DIAGNOSIS — K2970 Gastritis, unspecified, without bleeding: Secondary | ICD-10-CM | POA: Diagnosis present

## 2013-08-15 DIAGNOSIS — K219 Gastro-esophageal reflux disease without esophagitis: Secondary | ICD-10-CM

## 2013-08-15 DIAGNOSIS — Z9181 History of falling: Secondary | ICD-10-CM

## 2013-08-15 DIAGNOSIS — D649 Anemia, unspecified: Secondary | ICD-10-CM

## 2013-08-15 DIAGNOSIS — G8929 Other chronic pain: Secondary | ICD-10-CM | POA: Diagnosis present

## 2013-08-15 DIAGNOSIS — E871 Hypo-osmolality and hyponatremia: Secondary | ICD-10-CM

## 2013-08-15 DIAGNOSIS — E876 Hypokalemia: Secondary | ICD-10-CM

## 2013-08-15 DIAGNOSIS — R188 Other ascites: Secondary | ICD-10-CM

## 2013-08-15 DIAGNOSIS — F1020 Alcohol dependence, uncomplicated: Principal | ICD-10-CM | POA: Diagnosis present

## 2013-08-15 DIAGNOSIS — R112 Nausea with vomiting, unspecified: Secondary | ICD-10-CM

## 2013-08-15 DIAGNOSIS — Z22322 Carrier or suspected carrier of Methicillin resistant Staphylococcus aureus: Secondary | ICD-10-CM

## 2013-08-15 DIAGNOSIS — R269 Unspecified abnormalities of gait and mobility: Secondary | ICD-10-CM | POA: Diagnosis present

## 2013-08-15 DIAGNOSIS — Y9229 Other specified public building as the place of occurrence of the external cause: Secondary | ICD-10-CM

## 2013-08-15 DIAGNOSIS — H5500 Unspecified nystagmus: Secondary | ICD-10-CM | POA: Diagnosis present

## 2013-08-15 DIAGNOSIS — K7030 Alcoholic cirrhosis of liver without ascites: Secondary | ICD-10-CM | POA: Diagnosis present

## 2013-08-15 DIAGNOSIS — S065X9A Traumatic subdural hemorrhage with loss of consciousness of unspecified duration, initial encounter: Secondary | ICD-10-CM

## 2013-08-15 DIAGNOSIS — E43 Unspecified severe protein-calorie malnutrition: Secondary | ICD-10-CM

## 2013-08-15 DIAGNOSIS — B1920 Unspecified viral hepatitis C without hepatic coma: Secondary | ICD-10-CM | POA: Diagnosis present

## 2013-08-15 DIAGNOSIS — S065XAA Traumatic subdural hemorrhage with loss of consciousness status unknown, initial encounter: Secondary | ICD-10-CM

## 2013-08-15 DIAGNOSIS — R51 Headache: Secondary | ICD-10-CM | POA: Diagnosis present

## 2013-08-15 DIAGNOSIS — W19XXXA Unspecified fall, initial encounter: Secondary | ICD-10-CM

## 2013-08-15 LAB — CBC WITH DIFFERENTIAL/PLATELET
Basophils Absolute: 0 10*3/uL (ref 0.0–0.1)
Basophils Relative: 1 % (ref 0–1)
Eosinophils Absolute: 0.1 10*3/uL (ref 0.0–0.7)
Lymphocytes Relative: 37 % (ref 12–46)
MCH: 33.1 pg (ref 26.0–34.0)
MCHC: 34.6 g/dL (ref 30.0–36.0)
Monocytes Absolute: 0.3 10*3/uL (ref 0.1–1.0)
Neutrophils Relative %: 48 % (ref 43–77)
Platelets: 46 10*3/uL — ABNORMAL LOW (ref 150–400)
RDW: 14.6 % (ref 11.5–15.5)

## 2013-08-15 LAB — POCT I-STAT TROPONIN I: Troponin i, poc: 0.02 ng/mL (ref 0.00–0.08)

## 2013-08-15 LAB — COMPREHENSIVE METABOLIC PANEL
ALT: 34 U/L (ref 0–53)
Albumin: 2.9 g/dL — ABNORMAL LOW (ref 3.5–5.2)
Calcium: 8.3 mg/dL — ABNORMAL LOW (ref 8.4–10.5)
GFR calc Af Amer: 82 mL/min — ABNORMAL LOW (ref >90)
Glucose, Bld: 79 mg/dL (ref 70–99)
Sodium: 142 mEq/L (ref 135–145)
Total Protein: 6.9 g/dL (ref 6.0–8.3)

## 2013-08-15 LAB — ETHANOL: Alcohol, Ethyl (B): 244 mg/dL — ABNORMAL HIGH (ref 0–11)

## 2013-08-15 MED ORDER — HYDROMORPHONE HCL PF 1 MG/ML IJ SOLN
1.0000 mg | Freq: Once | INTRAMUSCULAR | Status: AC
  Administered 2013-08-15: 1 mg via INTRAVENOUS
  Filled 2013-08-15: qty 1

## 2013-08-15 MED ORDER — ONDANSETRON HCL 4 MG/2ML IJ SOLN
4.0000 mg | Freq: Once | INTRAMUSCULAR | Status: AC
  Administered 2013-08-15: 4 mg via INTRAVENOUS
  Filled 2013-08-15: qty 2

## 2013-08-15 MED ORDER — SODIUM CHLORIDE 0.9 % IV BOLUS (SEPSIS)
1000.0000 mL | Freq: Once | INTRAVENOUS | Status: AC
  Administered 2013-08-15: 1000 mL via INTRAVENOUS
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
## 2013-08-15 NOTE — ED Notes (Signed)
Swelling noted on pt's right lower jaw.  Upon palpation, area feels hard.  Pt reports pain upon palpation.

## 2013-08-15 NOTE — ED Notes (Signed)
Patient transported to CT 

## 2013-08-15 NOTE — ED Notes (Signed)
Pt given an ice water 

## 2013-08-16 ENCOUNTER — Encounter (HOSPITAL_COMMUNITY): Payer: Self-pay | Admitting: Internal Medicine

## 2013-08-16 ENCOUNTER — Observation Stay (HOSPITAL_COMMUNITY): Payer: Medicaid Other

## 2013-08-16 ENCOUNTER — Observation Stay (HOSPITAL_COMMUNITY)
Admit: 2013-08-16 | Discharge: 2013-08-16 | Disposition: A | Discharge status: Home / Self Care | Payer: Medicaid Other | Attending: Internal Medicine | Admitting: Internal Medicine

## 2013-08-16 DIAGNOSIS — R109 Unspecified abdominal pain: Secondary | ICD-10-CM

## 2013-08-16 DIAGNOSIS — R55 Syncope and collapse: Secondary | ICD-10-CM

## 2013-08-16 DIAGNOSIS — R112 Nausea with vomiting, unspecified: Secondary | ICD-10-CM

## 2013-08-16 DIAGNOSIS — I519 Heart disease, unspecified: Secondary | ICD-10-CM

## 2013-08-16 DIAGNOSIS — F1010 Alcohol abuse, uncomplicated: Secondary | ICD-10-CM

## 2013-08-16 DIAGNOSIS — K219 Gastro-esophageal reflux disease without esophagitis: Secondary | ICD-10-CM

## 2013-08-16 LAB — RAPID URINE DRUG SCREEN, HOSP PERFORMED
Amphetamines: NOT DETECTED
Barbiturates: NOT DETECTED
Cocaine: NOT DETECTED
Tetrahydrocannabinol: NOT DETECTED

## 2013-08-16 LAB — URINALYSIS, ROUTINE W REFLEX MICROSCOPIC
Glucose, UA: NEGATIVE mg/dL
Ketones, ur: NEGATIVE mg/dL
Leukocytes, UA: NEGATIVE
Protein, ur: NEGATIVE mg/dL

## 2013-08-16 LAB — COMPREHENSIVE METABOLIC PANEL
ALT: 30 U/L (ref 0–53)
AST: 72 U/L — ABNORMAL HIGH (ref 0–37)
Albumin: 2.6 g/dL — ABNORMAL LOW (ref 3.5–5.2)
Alkaline Phosphatase: 75 U/L (ref 39–117)
Potassium: 3.8 mEq/L (ref 3.5–5.1)
Sodium: 140 mEq/L (ref 135–145)
Total Protein: 6.2 g/dL (ref 6.0–8.3)

## 2013-08-16 LAB — CBC
HCT: 35.7 % — ABNORMAL LOW (ref 39.0–52.0)
Hemoglobin: 12.1 g/dL — ABNORMAL LOW (ref 13.0–17.0)
MCHC: 33.9 g/dL (ref 30.0–36.0)
MCV: 96.5 fL (ref 78.0–100.0)
RDW: 14.7 % (ref 11.5–15.5)

## 2013-08-16 LAB — URINE MICROSCOPIC-ADD ON

## 2013-08-16 LAB — TSH: TSH: 3.809 u[IU]/mL (ref 0.350–4.500)

## 2013-08-16 LAB — RPR: RPR Ser Ql: NONREACTIVE

## 2013-08-16 LAB — VITAMIN B12: Vitamin B-12: 654 pg/mL (ref 211–911)

## 2013-08-16 MED ORDER — SODIUM CHLORIDE 0.9 % IJ SOLN
3.0000 mL | Freq: Two times a day (BID) | INTRAMUSCULAR | Status: DC
  Administered 2013-08-16 – 2013-08-17 (×4): 3 mL via INTRAVENOUS

## 2013-08-16 MED ORDER — PANTOPRAZOLE SODIUM 40 MG PO TBEC
40.0000 mg | DELAYED_RELEASE_TABLET | Freq: Two times a day (BID) | ORAL | Status: DC
  Administered 2013-08-16 – 2013-08-20 (×9): 40 mg via ORAL
  Filled 2013-08-16 (×13): qty 1

## 2013-08-16 MED ORDER — ONDANSETRON HCL 4 MG PO TABS
4.0000 mg | ORAL_TABLET | Freq: Four times a day (QID) | ORAL | Status: DC | PRN
  Administered 2013-08-16 (×2): 4 mg via ORAL
  Filled 2013-08-16 (×2): qty 1

## 2013-08-16 MED ORDER — ONDANSETRON HCL 4 MG/2ML IJ SOLN: 4.0000 mg | Freq: Four times a day (QID) | INTRAMUSCULAR | Status: DC | PRN

## 2013-08-16 MED ORDER — POLYETHYLENE GLYCOL 3350 17 G PO PACK
17.0000 g | PACK | Freq: Every day | ORAL | Status: DC | PRN
  Administered 2013-08-19: 22:00:00 17 g via ORAL
  Filled 2013-08-16: qty 1

## 2013-08-16 MED ORDER — ADULT MULTIVITAMIN W/MINERALS CH
1.0000 | ORAL_TABLET | Freq: Every day | ORAL | Status: DC
  Administered 2013-08-16 – 2013-08-20 (×5): 1 via ORAL
  Filled 2013-08-16 (×5): qty 1

## 2013-08-16 MED ORDER — OMEPRAZOLE MAGNESIUM 20 MG PO TBEC: 20.0000 mg | DELAYED_RELEASE_TABLET | Freq: Two times a day (BID) | ORAL | Status: DC

## 2013-08-16 MED ORDER — LORAZEPAM 1 MG PO TABS
0.0000 mg | ORAL_TABLET | ORAL | Status: AC
  Administered 2013-08-18 – 2013-08-19 (×3): 1 mg via ORAL
  Filled 2013-08-16 (×3): qty 1

## 2013-08-16 MED ORDER — BISACODYL 5 MG PO TBEC: 5.0000 mg | DELAYED_RELEASE_TABLET | Freq: Every day | ORAL | Status: DC | PRN

## 2013-08-16 MED ORDER — OXYCODONE HCL 5 MG PO TABS
5.0000 mg | ORAL_TABLET | ORAL | Status: DC | PRN
  Administered 2013-08-16 – 2013-08-20 (×22): 5 mg via ORAL
  Filled 2013-08-16 (×22): qty 1

## 2013-08-16 MED ORDER — FOLIC ACID 1 MG PO TABS
1.0000 mg | ORAL_TABLET | Freq: Every day | ORAL | Status: DC
  Administered 2013-08-16 – 2013-08-20 (×5): 1 mg via ORAL
  Filled 2013-08-16 (×5): qty 1

## 2013-08-16 MED ORDER — ALUM & MAG HYDROXIDE-SIMETH 200-200-20 MG/5ML PO SUSP
30.0000 mL | ORAL | Status: DC | PRN
  Administered 2013-08-16: 30 mL via ORAL
  Filled 2013-08-16: qty 30

## 2013-08-16 MED ORDER — MENTHOL 3 MG MT LOZG: 1.0000 | LOZENGE | OROMUCOSAL | Status: DC | PRN

## 2013-08-16 MED ORDER — LORAZEPAM 1 MG PO TABS
0.0000 mg | ORAL_TABLET | ORAL | Status: AC
  Administered 2013-08-16 (×3): 1 mg via ORAL
  Administered 2013-08-16: 2 mg via ORAL
  Administered 2013-08-17 (×2): 1 mg via ORAL
  Filled 2013-08-16: qty 1
  Filled 2013-08-16: qty 2
  Filled 2013-08-16 (×4): qty 1

## 2013-08-16 MED ORDER — PNEUMOCOCCAL VAC POLYVALENT 25 MCG/0.5ML IJ INJ
0.5000 mL | INJECTION | INTRAMUSCULAR | Status: AC
  Administered 2013-08-17: 11:00:00 0.5 mL via INTRAMUSCULAR
  Filled 2013-08-16 (×2): qty 0.5

## 2013-08-16 MED ORDER — VITAMIN B-1 100 MG PO TABS
100.0000 mg | ORAL_TABLET | Freq: Every day | ORAL | Status: DC
  Administered 2013-08-16 – 2013-08-20 (×5): 100 mg via ORAL
  Filled 2013-08-16 (×5): qty 1

## 2013-08-16 NOTE — Progress Notes (Signed)
[facility]  2D Echocardiogram completed.  Mechell Girgis L Kasey Hansell, RDCS 08/16/2013 2:48 PM

## 2013-08-16 NOTE — ED Provider Notes (Signed)
Medical screening examination/treatment/procedure(s) were performed by non-physician practitioner and as supervising physician I was immediately available for consultation/collaboration.  Maddalena Linarez R. Anjelita Sheahan, MD 08/16/13 0038 

## 2013-08-16 NOTE — H&P (Signed)
[facility] History and Physical  HARSHAN KEARLEY FAO:[id number] DOB: Apr 22, 1956 DOA: 08/15/2013  Referring physician:  Earley Favor PCP:  Iona Hansen, NP   Chief Complaint:  Recurrent syncope  HPI:  The patient is a 57 y.o. year-old male with history of HCV, EtOH cirrhosis, GERD, bleeding ulcers, subdural hematoma, incisional hernia s/p mesh placement with subsequent infections,  who presents with recurrent syncope.  The patient was last at their baseline health several months ago.  He states he would rarely have syncope spells until the last month.  Now, they are occuring about every other day.  They normally occur when he attempts to stand from a seated position, but he denies flushing, nausea, darkening of vision or diminished hearing.  He does not remember falling and instead wakes up on the floor.  He has recently started losing control of his bowels and bladder during these episodes.  He denies history of seizures or drug use, however, he admits to using 6-10 beers per day.  He has not taken his home medications in a week because he ran out.    He has had some nausea, vomiting, nonbilious and nonbloody, particularly after meals and poor appetite.  Had some diarrhea last week which has since resolved.    In the ER, WBC 2.9 (lower than previous), hgb 13.3, plt 46 (stable), AST 88, ALT 34, bili 1.6 (also stable), albumin 2.9.  INR from a few days ago was 1.43.  EtOH 244.  CXR:  COPD.  KUB:  Gallstones and normal gas pattern.  CT head and orbits:  Mild atrophy but no acute fractures, hemorrhages, or changes.  Troponin neg.  ECG NSR. Patient is being admitted for syncope work up.    Review of Systems:  General:  Denies fevers, chills, weight loss or gain HEENT:  Denies changes to hearing and vision, rhinorrhea, sinus congestion, sore throat CV:  Denies chest pain and palpitations.  Chronic lower extremity edema.  Denies PND and orthopnea PULM:  Denies SOB, wheezing, cough.   GI:  Per HPI.    GU:  Denies dysuria, frequency, urgency ENDO:  Denies polyuria, polydipsia.   HEME:  Denies hematemesis, blood in stools, melena, abnormal bruising or bleeding.  LYMPH:  Denies lymphadenopathy.   MSK:  Denies arthralgias, myalgias.   DERM:  Denies skin rash or ulcer but has chronic tender hernia on anterior abdomen.   NEURO:  Denies focal numbness, weakness, slurred speech, confusion, facial droop.  PSYCH:  Denies anxiety and depression.    Past Medical History  Diagnosis Date  . Cirrhosis of liver   . Hepatitis C 03-28-12    ? 80's-prev. IV drug abuse  . 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 Blood transfusion 03-28-12    '81-hx. of bleeding ulcer  . History of bleeding ulcers 03-28-12    Hx. '81  . GERD (gastroesophageal reflux disease) 03-26-12    Hx. reflux. ulcers in past, occ. OTC med  . Incisional hernia     has drainage system from abdomin  . Subdural hematoma    Past Surgical History  Procedure Laterality Date  . Umbilical hernia repair    . Hernia repair  08/24/11    ventral hernia with mesh  . Hernia repair  11'11    Umbilical  . Peptic ulcer  03-26-12    open peptic ulcer surgery repair- '81  . Incisional hernia repair  03/26/2012  . Incisional hernia repair  03/30/2012    Procedure:  LAPAROSCOPIC INCISIONAL HERNIA;  Surgeon: Shelly Rubenstein, MD;  Location: [facility];  Service: General;  Laterality: N/A;  Laparoscopic Incisional/ventral Hernia Repair with Mesh  . Tonsillectomy    . Appendectomy    . Incisional hernia repair  07/20/2012    mesh  . Incisional hernia repair  07/20/2012    Procedure: HERNIA REPAIR INCISIONAL;  Surgeon: Shelly Rubenstein, MD;  Location: [facility] OR;  Service: General;  Laterality: N/A;  repair of incisional hernia with Biologic mesh   Social History:  reports that he has been smoking Cigarettes.  He has a 9.5 pack-year smoking history. He has never used smokeless tobacco. He reports that he drinks about 21.0 ounces of  alcohol per week. He reports that he does not use illicit drugs.  No Known Allergies  Family History  Problem Relation Age of Onset  . Cancer Mother     breast  . Cancer Father     brain  . Heart disease Brother   . Cancer Sister     breast  . Cancer Sister     breast     Prior to Admission medications   Medication Sig Start Date End Date Taking? Authorizing Provider  folic acid (FOLVITE) 1 MG tablet Take 1 tablet (1 mg total) by mouth daily. 04/25/13  Yes Renae Fickle, MD  Multiple Vitamin (MULITIVITAMIN WITH MINERALS) TABS Take 1 tablet by mouth daily.   Yes Historical Provider, MD  omeprazole (PRILOSEC OTC) 20 MG tablet Take 20 mg by mouth daily.   Yes Historical Provider, MD  oxyCODONE (OXY IR/ROXICODONE) 5 MG immediate release tablet Take 1 tablet (5 mg total) by mouth every 4 (four) hours as needed for pain. 06/11/13  Yes Renne Crigler, PA-C  spironolactone (ALDACTONE) 25 MG tablet Take 25 mg by mouth daily.   Yes Historical Provider, MD  thiamine 100 MG tablet Take 1 tablet (100 mg total) by mouth daily. 04/25/13  Yes Renae Fickle, MD   Physical Exam: Filed Vitals:   08/15/13 1922 08/15/13 2215 08/15/13 2249  BP: 104/73  135/63  Pulse: 75  65  Temp: 97.7 F (36.5 C)    TempSrc: Oral    Resp:  16 18  SpO2: 100%  98%     General:  CM, no acute distress, clear laceration with dry blood on the left side of his face  Eyes:  PERRL, anicteric, non-injected.  ENT:  Nares clear.  OP clear, non-erythematous without plaques or exudates.  MMM.  Right parotid area firm, enlarged and TTP.  Neck:  Supple without TM or JVD.    Lymph:  No cervical, supraclavicular, or submandibular LAD.  Cardiovascular:  RRR, normal S1, S2, without m/r/g.  2+ pulses, warm extremities  Respiratory:  Diminished bilateral breath sounds without wheeze, rales, or rhonchi, without increased WOB.  Abdomen:  NABS.  Soft, mildly distended with visible hernia that is TTP without rebound or  guarding (chonic level of pain per patient)   Skin:  No rashes or focal lesions.  Musculoskeletal:  Normal bulk and tone.  2+ soft bilateral LE edema.  Psychiatric:  A & O x 4.  Appropriate affect.  Neurologic:  CN 3-12 intact.  5/5 strength.  Sensation intact.  Labs on Admission:  Basic Metabolic Panel:  Recent Labs Lab 08/13/13 1835 08/15/13 2202  NA 138 142  K 3.8 4.5  CL 105 110  CO2 21 23  GLUCOSE 81 79  BUN 10 8  CREATININE 1.10 1.12  CALCIUM 8.5 8.3*  Liver Function Tests:  Recent Labs Lab 08/13/13 1835 08/15/13 2202  AST 83* 88*  ALT 34 34  ALKPHOS 85 85  BILITOT 1.5* 1.6*  PROT 6.9 6.9  ALBUMIN 3.1* 2.9*   No results found for this basename: LIPASE, AMYLASE,  in the last 168 hours  Recent Labs Lab 08/15/13 2202  AMMONIA 52   CBC:  Recent Labs Lab 08/13/13 1835 08/15/13 2202  WBC 4.2 2.9*  NEUTROABS 2.1 1.4*  HGB 12.4* 13.3  HCT 35.8* 38.4*  MCV 97.0 95.5  PLT 56* 46*   Cardiac Enzymes:  Recent Labs Lab 08/13/13 1835  TROPONINI <0.30    BNP (last 3 results) No results found for this basename: PROBNP,  in the last 8760 hours CBG: No results found for this basename: GLUCAP,  in the last 168 hours  Radiological Exams on Admission: Ct Head Wo Contrast  08/15/2013   *RADIOLOGY REPORT*  Clinical Data:  Facial laceration  CT HEAD AND ORBITS WITHOUT CONTRAST  Technique:  Contiguous axial images were obtained from the base of the skull through the vertex without contrast. Multidetector CT imaging of the orbits was performed using the standard protocol without intravenous contrast.  Comparison:  CT head 08/13/2013  CT HEAD  Findings: Mild atrophy.  Negative for intracranial hemorrhage.  No acute infarct or mass.  Negative for skull fracture.  IMPRESSION: No acute abnormality.  CT ORBITS  Findings: Negative for facial fracture.  No fracture of the orbit. Nasal bone is intact.  No air-fluid level.  Nasal septum is deviated to the right.   IMPRESSION: Negative for fracture of the orbit.   Original Report Authenticated By: Janeece Riggers, M.D.   Dg Abd Acute W/chest  08/15/2013   *RADIOLOGY REPORT*  Clinical Data: Fall.  Abdominal pain  ACUTE ABDOMEN SERIES (ABDOMEN 2 VIEW & CHEST 1 VIEW)  Comparison: 08/13/2013  Findings: COPD.  Lungs are clear.  Surgical clips in the GE junction.  Normal bowel gas pattern.  Negative for free air.  Right upper quadrant calcifications consistent with gallstones.  Mild scoliosis.  No acute bony abnormality.  IMPRESSION: COPD.  No active cardiopulmonary abnormality.  Gallstones  Normal bowel gas pattern.   Original Report Authenticated By: Janeece Riggers, M.D.   Ct Orbitss W/o Cm  08/15/2013   *RADIOLOGY REPORT*  Clinical Data:  Facial laceration  CT HEAD AND ORBITS WITHOUT CONTRAST  Technique:  Contiguous axial images were obtained from the base of the skull through the vertex without contrast. Multidetector CT imaging of the orbits was performed using the standard protocol without intravenous contrast.  Comparison:  CT head 08/13/2013  CT HEAD  Findings: Mild atrophy.  Negative for intracranial hemorrhage.  No acute infarct or mass.  Negative for skull fracture.  IMPRESSION: No acute abnormality.  CT ORBITS  Findings: Negative for facial fracture.  No fracture of the orbit. Nasal bone is intact.  No air-fluid level.  Nasal septum is deviated to the right.  IMPRESSION: Negative for fracture of the orbit.   Original Report Authenticated By: Janeece Riggers, M.D.    EKG: Independently reviewed. NSR  Assessment/Plan Principal Problem:   Syncope Active Problems:   Cirrhosis   Cirrhosis of liver with history of hepatitis C   Severe malnutrition   Ascites   Fall   Thrombocytopenia, unspecified   Alcohol abuse   GERD (gastroesophageal reflux disease)   Nausea and vomiting  Recurrent syncope with loss of bowel and bladder control.  DDx includes orthostasis, arrhythmia, seizures, neurologic  effects from chronic EtOH  use, underlying infection, other drug use, valvular problem or CHF.  Also, swelling on the parotid area of the right neck may be causing some carotid impingement or stenosis.  Ammonia level 52. -  Telemetry -  ECHO -  EEG -  Carotid duplex -  Strongly counseled cessation from EtOH.  Advised patient that if we are unable to find another cause of his syncope, that we will continue to try to help him, but until he quits alcohol, we may not be successful. -  Orthostatics -  TED hose -  TSH, RPR, B12 -  UA and UDS  Parotid swelling:  May represent parotid enlargement from EtOH abuse, chronic vomiting or may have some LN/tumor, particularly given leukopenia and hx of EtOH and tobacco use -  US carotid and US soft tissue -  Lozenges   EtOH ongoing and currently having some signs of withdrawal -  CIWA q2h for now -  Seizure precautions -  Folate, thiamine, MVI  Nausea and vomiting, likely related to gastritis and possible PUD -  Start higher dose PPI -  Consider addition of carafate  Cirrhosis  -  Restart spironoloactone +/- lasix if orthostatics are negative  Thrombocytopenia, elevated bilirubin, depressed albumin, and moderately elevated INR are likely secondary to cirrhosis. -  Trend  Leukopenia -  Differential mostly normal -  Smear unremarkable -  Patient agreed to HIV testing  Mild transaminitis likely secondary to EtOH abuse.    Diet:  Low salt Access:  PIV IVF:  off Proph:  SCD  Code Status: full Family Communication: patient alone Disposition Plan: Admit to telemetry  Time spent: 60 min Renae Fickle [facility] Pager [phone]  If 7PM-7AM, please contact night-coverage [email or web address] Password [facility] 08/16/2013, 12:37 AM

## 2013-08-16 NOTE — Evaluation (Signed)
Occupational Therapy Evaluation Patient Details Name: Noah Medina MRN: [id number] DOB: [birth year] Today's Date: 08/16/2013 Time: 4098-1191 OT Time Calculation (min): 19 min  OT Assessment / Plan / Recommendation History of present illness patient is a 57 y.o. year-old male with history of HCV, EtOH cirrhosis, GERD, bleeding ulcers, subdural hematoma, incisional hernia s/p mesh placement with subsequent infections,  who presents with recurrent syncope.  The patient was last at their baseline health several months ago.  He states he would rarely have syncope spells until the last month.  Now, they are occuring about every other day.  They normally occur when he attempts to stand from a seated position, but he denies flushing, nausea, darkening of vision or diminished hearing.  He does not remember falling and instead wakes up on the floor.  He has recently started losing control of his bowels and bladder during these episodes.  He denies history of seizures or drug use, however, he admits to using 6-10 beers per day.  He has not taken his home medications in a week because he ran out   Clinical Impression   Pt was admitted for syncope.  He has a h/o ETOH and had tremors at time of OT evaluation.  Will follow in acute for increased safety and independence with adls with supervision to min A level goals in acute.      OT Assessment  Patient needs continued OT Services    Follow Up Recommendations  SNF;Supervision/Assistance - 24 hour    Barriers to Discharge      Equipment Recommendations  3 in 1 bedside comode    Recommendations for Other Services    Frequency  Min 2X/week    Precautions / Restrictions Precautions Precautions: Fall Restrictions Weight Bearing Restrictions: No   Pertinent Vitals/Pain C/o abd pain.  Pt has hernia belt.  Pain not rated.      ADL  Grooming: Wash/dry hands;Set up (would need more assistance for teeth) Where Assessed - Grooming: Supine, head of bed  up Upper Body Bathing: Minimal assistance Where Assessed - Upper Body Bathing: Supported sitting Lower Body Bathing: +2 Total assistance Lower Body Bathing: Patient Percentage: 30% Where Assessed - Lower Body Bathing: Supported sit to stand Upper Body Dressing: Moderate assistance Where Assessed - Upper Body Dressing: Supported sitting Lower Body Dressing: +2 Total assistance Lower Body Dressing: Patient Percentage: 10% Where Assessed - Lower Body Dressing: Supported sit to stand Toileting - Architect and Hygiene: +2 Total assistance Toileting - Clothing Manipulation and Hygiene: Patient Percentage: 0% Equipment Used: Rolling walker Transfers/Ambulation Related to ADLs: pt had tremors sitting eob; stood only to clean urine off him. ADL Comments: Tremors interfere with adls.  Pt needed assistance to place urinal (mod A overall).  He was able to hold this.  When OT/PT arrived, pt was soiled by urine.  Assisted pt with cleaning up, sit to stand.  Pt performed less than 25% from seated position    OT Diagnosis: Generalized weakness  OT Problem List: Decreased strength;Decreased activity tolerance;Impaired balance (sitting and/or standing);Impaired UE functional use;Pain OT Treatment Interventions: Self-care/ADL training;Therapeutic activities;Patient/family education;Balance training   OT Goals(Current goals can be found in the care plan section) Acute Rehab OT Goals Patient Stated Goal: none stated; agreeable to OT/PT OT Goal Formulation: With patient Time For Goal Achievement: 08/30/13 Potential to Achieve Goals: Good ADL Goals Pt Will Perform Grooming: with set-up;with supervision;sitting (unsupported) Pt Will Perform Upper Body Bathing: with set-up;with supervision;sitting (unsupported) Pt Will Perform Lower Body Bathing:  with min assist;sit to/from stand Pt Will Perform Upper Body Dressing: with set-up;with supervision;sitting (unsupported) Pt Will Perform Lower Body  Dressing: with min assist;sit to/from stand Pt Will Transfer to Toilet: with min assist;ambulating;bedside commode  Visit Information  Last OT Received On: 08/16/13 Assistance Needed: +2 PT/OT Co-Evaluation/Treatment: Yes History of Present Illness: patient is a 57 y.o. year-old male with history of HCV, EtOH cirrhosis, GERD, bleeding ulcers, subdural hematoma, incisional hernia s/p mesh placement with subsequent infections,  who presents with recurrent syncope.  The patient was last at their baseline health several months ago.  He states he would rarely have syncope spells until the last month.  Now, they are occuring about every other day.  They normally occur when he attempts to stand from a seated position, but he denies flushing, nausea, darkening of vision or diminished hearing.  He does not remember falling and instead wakes up on the floor.  He has recently started losing control of his bowels and bladder during these episodes.  He denies history of seizures or drug use, however, he admits to using 6-10 beers per day.  He has not taken his home medications in a week because he ran out       Prior Functioning     Home Living Family/patient expects to be discharged to:: Private residence Living Arrangements: Alone Type of Home: House Home Access: Stairs to enter [employer] of Steps: 4 Additional Comments: ? if pt moved.  Answers provided this admission are different from last admission.  Significant other present in the beginning of session.  Pt lives alone Prior Function Level of Independence: Independent Comments: pt has had frequent falls, uncertain of independence. Communication Communication: No difficulties         Vision/Perception     Cognition  Cognition Arousal/Alertness:  (sleepy but arousable) Behavior During Therapy: WFL for tasks assessed/performed Overall Cognitive Status: Within Functional Limits for tasks assessed (for this session; to be  further assessed)    Extremity/Trunk Assessment Upper Extremity Assessment Upper Extremity Assessment: Generalized weakness (pt has bil tremors; did not MMT)     Mobility Bed Mobility Bed Mobility: Sit to Supine;Supine to Sit Supine to Sit: 1: +2 Total assist Supine to Sit: Patient Percentage: 60% Details for Bed Mobility Assistance: educated on rolling technique Transfers Transfers: Sit to Stand;Stand to Sit Sit to Stand: 1: +2 Total assist Sit to Stand: Patient Percentage: 60% Details for Transfer Assistance: pt very unsteady due to tremors     Exercise     Balance Balance Balance Assessed: Yes Static Sitting Balance Static Sitting - Balance Support: Bilateral upper extremity supported Static Sitting - Level of Assistance: 5: Stand by assistance Static Sitting - Comment/# of Minutes: 5 minutes   End of Session OT - End of Session Activity Tolerance: Patient limited by fatigue Patient left: in bed;with call bell/phone within reach;with bed alarm set  GO     Bryleigh Ottaway 08/16/2013, 3:58 PM Marica Otter, OTR/L [phone] 08/16/2013

## 2013-08-16 NOTE — Progress Notes (Signed)
[facility] PROGRESS NOTE  Noah Medina:[id number] DOB: 06-04-1956 DOA: 08/15/2013 PCP: Iona Hansen, NP  Assessment/Plan: Falls with syncope with loss of bowel and bladder control. -Suspect a combination of gait instability, inebriation, and possibly seizures. -Patient states that most of his episodes of dizziness and falls or after alcohol use without any overt syncope unless he hits his head -Orthostatic vital signs negative -Ammonia level 52.  - Telemetry  - ECHO  - EEG  - Carotid duplex  - Strongly counseled cessation from EtOH. Advised patient that if we are unable to find another cause of his syncope, that we will continue to try to help him, but until he quits alcohol, we may not be successful.  - TED hose  - TSH, RPR, B12, RBC folate--all unremarkable  - UA and UDS--negative -MR brain EtOH and tobacco use  EtOH ongoing and currently having some signs of withdrawal  - CIWA q2h for now  - Seizure precautions  - Folate, thiamine, MVI  Nausea and vomiting,  -likely related to gastritis and possible PUD - Start higher dose PPI  - Consider addition of carafate  Tremor with nystagmus -MR brain -Suspect a component of alcohol withdrawal Cirrhosis  -consider  Restart spironoloactone +/- lasix Thrombocytopenia, elevated bilirubin, depressed albumin, and moderately elevated INR are likely secondary to cirrhosis.  - Trend  Leukopenia  - Differential mostly normal  - Smear unremarkable  - Patient agreed to HIV testing  Mild transaminitis  -likely secondary to EtOH abuse.  Diet: Low salt  Access: PIV  IVF: off  Proph: SCD  Code Status: full  Family Communication: patient alone  Disposition Plan: Admit to telemetry        Procedures/Studies: Dg Chest 2 View  08/13/2013   *RADIOLOGY REPORT*  Clinical Data: Syncope  CHEST - 2 VIEW  Comparison: 06/11/2013  Findings: The heart size and vascular pattern are normal.  The lungs are clear.  Surgical staples  are seen in the gastroesophageal junction.  IMPRESSION: No acute abnormalities   Original Report Authenticated By: Esperanza Heir, M.D.   Ct Head Wo Contrast  08/15/2013   *RADIOLOGY REPORT*  Clinical Data:  Facial laceration  CT HEAD AND ORBITS WITHOUT CONTRAST  Technique:  Contiguous axial images were obtained from the base of the skull through the vertex without contrast. Multidetector CT imaging of the orbits was performed using the standard protocol without intravenous contrast.  Comparison:  CT head 08/13/2013  CT HEAD  Findings: Mild atrophy.  Negative for intracranial hemorrhage.  No acute infarct or mass.  Negative for skull fracture.  IMPRESSION: No acute abnormality.  CT ORBITS  Findings: Negative for facial fracture.  No fracture of the orbit. Nasal bone is intact.  No air-fluid level.  Nasal septum is deviated to the right.  IMPRESSION: Negative for fracture of the orbit.   Original Report Authenticated By: Janeece Riggers, M.D.   Ct Head Wo Contrast  08/13/2013   *RADIOLOGY REPORT*  Clinical Data:  Syncope.  CT HEAD WITHOUT CONTRAST CT CERVICAL SPINE WITHOUT CONTRAST  Technique:  Multidetector CT imaging of the head and cervical spine was performed following the standard protocol without intravenous contrast.  Multiplanar CT image reconstructions of the cervical spine were also generated.  Comparison:  Head CT scan 04/23/2013.  CT HEAD  Findings: There is some cortical atrophy.  No evidence of acute intracranial abnormality including infarction, hemorrhage, mass lesion, mass effect, midline shift or abnormal extra-axial fluid collection is identified.  There is  no hydrocephalus or pneumocephalus.  The calvarium is intact.  IMPRESSION: No acute finding.  CT CERVICAL SPINE  Findings: There is no fracture or subluxation of the cervical spine.  Loss of disc space height and endplate spurring appear worst at C6-7.  Paraspinous structures are unremarkable.  IMPRESSION: No acute finding.   Original Report  Authenticated By: Holley Dexter, M.D.   Ct Cervical Spine Wo Contrast  08/13/2013   *RADIOLOGY REPORT*  Clinical Data:  Syncope.  CT HEAD WITHOUT CONTRAST CT CERVICAL SPINE WITHOUT CONTRAST  Technique:  Multidetector CT imaging of the head and cervical spine was performed following the standard protocol without intravenous contrast.  Multiplanar CT image reconstructions of the cervical spine were also generated.  Comparison:  Head CT scan 04/23/2013.  CT HEAD  Findings: There is some cortical atrophy.  No evidence of acute intracranial abnormality including infarction, hemorrhage, mass lesion, mass effect, midline shift or abnormal extra-axial fluid collection is identified.  There is no hydrocephalus or pneumocephalus.  The calvarium is intact.  IMPRESSION: No acute finding.  CT CERVICAL SPINE  Findings: There is no fracture or subluxation of the cervical spine.  Loss of disc space height and endplate spurring appear worst at C6-7.  Paraspinous structures are unremarkable.  IMPRESSION: No acute finding.   Original Report Authenticated By: Holley Dexter, M.D.   Dg Abd Acute W/chest  08/15/2013   *RADIOLOGY REPORT*  Clinical Data: Fall.  Abdominal pain  ACUTE ABDOMEN SERIES (ABDOMEN 2 VIEW & CHEST 1 VIEW)  Comparison: 08/13/2013  Findings: COPD.  Lungs are clear.  Surgical clips in the GE junction.  Normal bowel gas pattern.  Negative for free air.  Right upper quadrant calcifications consistent with gallstones.  Mild scoliosis.  No acute bony abnormality.  IMPRESSION: COPD.  No active cardiopulmonary abnormality.  Gallstones  Normal bowel gas pattern.   Original Report Authenticated By: Janeece Riggers, M.D.   Ct Orbitss W/o Cm  08/15/2013   *RADIOLOGY REPORT*  Clinical Data:  Facial laceration  CT HEAD AND ORBITS WITHOUT CONTRAST  Technique:  Contiguous axial images were obtained from the base of the skull through the vertex without contrast. Multidetector CT imaging of the orbits was performed using the  standard protocol without intravenous contrast.  Comparison:  CT head 08/13/2013  CT HEAD  Findings: Mild atrophy.  Negative for intracranial hemorrhage.  No acute infarct or mass.  Negative for skull fracture.  IMPRESSION: No acute abnormality.  CT ORBITS  Findings: Negative for facial fracture.  No fracture of the orbit. Nasal bone is intact.  No air-fluid level.  Nasal septum is deviated to the right.  IMPRESSION: Negative for fracture of the orbit.   Original Report Authenticated By: Janeece Riggers, M.D.         Subjective: Patient complains of some nausea without any emesis. Denies any fevers, chills, headache, chest pain, shortness breath, abdominal pain, dysuria, hematuria.  Objective: Filed Vitals:   08/16/13 0700 08/16/13 0945 08/16/13 1150 08/16/13 1400  BP: 125/51 125/56 126/58 127/59  Pulse: 82 76 74 76  Temp:    98.1 F (36.7 C)  TempSrc:    Oral  Resp:    21  Height:      Weight:      SpO2:    100%    Intake/Output Summary (Last 24 hours) at 08/16/13 1539 Last data filed at 08/16/13 1400  Gross per 24 hour  Intake    720 ml  Output    100 ml  Net    620 ml   Weight change:  Exam:   General:  Pt is alert, follows commands appropriately, not in acute distress  HEENT: No icterus, No thrush, No neck mass, [place]/AT  Cardiovascular: RRR, S1/S2, no rubs, no gallops  Respiratory: Basilar crackles without any wheezing. Good air movement.  Abdomen: Soft/+BS, non tender, non distended, no guarding; ventral hernia noted, reducible  Extremities: trace edema, No lymphangitis, No petechiae, No rashes, no synovitis  Data Reviewed: Basic Metabolic Panel:  Recent Labs Lab 08/13/13 1835 08/15/13 2202 08/16/13 0259  NA 138 142 140  K 3.8 4.5 3.8  CL 105 110 108  CO2 21 23 22   GLUCOSE 81 79 120*  BUN 10 8 8   CREATININE 1.10 1.12 0.97  CALCIUM 8.5 8.3* 7.8*   Liver Function Tests:  Recent Labs Lab 08/13/13 1835 08/15/13 2202 08/16/13 0259  AST 83* 88* 72*   ALT 34 34 30  ALKPHOS 85 85 75  BILITOT 1.5* 1.6* 1.4*  PROT 6.9 6.9 6.2  ALBUMIN 3.1* 2.9* 2.6*   No results found for this basename: LIPASE, AMYLASE,  in the last 168 hours  Recent Labs Lab 08/15/13 2202  AMMONIA 52   CBC:  Recent Labs Lab 08/13/13 1835 08/15/13 2202 08/16/13 0259  WBC 4.2 2.9* 2.4*  NEUTROABS 2.1 1.4*  --   HGB 12.4* 13.3 12.1*  HCT 35.8* 38.4* 35.7*  MCV 97.0 95.5 96.5  PLT 56* 46* 38*   Cardiac Enzymes:  Recent Labs Lab 08/13/13 1835  TROPONINI <0.30   BNP: No components found with this basename: POCBNP,  CBG: No results found for this basename: GLUCAP,  in the last 168 hours  Recent Results (from the past 240 hour(s))  MRSA PCR SCREENING     Status: None   Collection Time    08/16/13  5:40 AM      Result Value Range Status   MRSA by PCR NEGATIVE  NEGATIVE Final   Comment:            The GeneXpert MRSA Assay (FDA     approved for NASAL specimens     only), is one component of a     comprehensive MRSA colonization     surveillance program. It is not     intended to diagnose MRSA     infection nor to guide or     monitor treatment for     MRSA infections.     Scheduled Meds: . folic acid  1 mg Oral Daily  . LORazepam  0-4 mg Oral Q2H   Followed by  . [START ON 08/18/2013] LORazepam  0-4 mg Oral Q4H  . multivitamin with minerals  1 tablet Oral Daily  . pantoprazole  40 mg Oral BID AC  . [START ON 08/17/2013] pneumococcal 23 valent vaccine  0.5 mL Intramuscular Tomorrow-1000  . sodium chloride  3 mL Intravenous Q12H  . thiamine  100 mg Oral Daily   Continuous Infusions:    Nuel Dejaynes, DO  [facility] Pager [phone]  If 7PM-7AM, please contact night-coverage [email or web address] Password [email or web address] 08/16/2013, 3:39 PM   LOS: 1 day

## 2013-08-16 NOTE — Progress Notes (Signed)
*  PRELIMINARY RESULTS* Vascular Ultrasound Carotid Duplex (Doppler) has been completed.   Findings suggest 1-39% internal carotid artery stenosis bilaterally. Vertebral arteries are patent with antegrade flow.  08/16/2013 10:38 AM Gertie Fey, RVT, RDCS, RDMS

## 2013-08-16 NOTE — Procedures (Signed)
History: 57 yo M with syncope, poss sz  Sedation: none  Background: The background consists of intermixed alpha and beta activities. There is a well defined posterior dominant rhythm of 12 Hz that attenuates with eye opening. There is a mild increase in slow activity with drowsiness.   Photic stimulation: Physiologic driving is present  EEG Abnormalities: none  Clinical Interpretation: This normal EEG is recorded in the waking and drowsy state. There was no seizure or seizure predisposition recorded on this study.   Ritta Slot, MD [facility] [phone]  If 7pm- 7am, please page neurology on call at [phone].

## 2013-08-16 NOTE — Progress Notes (Signed)
EEG completed; results pending.    

## 2013-08-16 NOTE — Evaluation (Signed)
Physical Therapy Evaluation Patient Details Name: Noah Medina MRN: [id number] DOB: [birth year] Today's Date: 08/16/2013 Time: 1478-2956 PT Time Calculation (min): 19 min  PT Assessment / Plan / Recommendation History of Present Illness  patient is a 57 y.o. year-old male admitted 08/15/13  with history of HCV, EtOH cirrhosis, GERD, bleeding ulcers, subdural hematoma, incisional hernia s/p mesh placement with subsequent infections,  who presents with recurrent syncope.  The patient was last at their baseline health several months ago.  He states he would rarely have syncope spells until the last month.  Now, they are occuring about every other day.  They normally occur when he attempts to stand from a seated position,   He does not remember falling and instead wakes up on the floor.  He has recently started losing control of his bowels and bladder during these episodes.  He denies history of seizures or drug use, however, he admits to using 6-10 beers per day.  He has not taken his home medications in a week because he ran out  Clinical Impression  Pt presents with significant tremors of extremities and trunk, especially with volitional movements. Pt will benefit from PT while in acute care to address functional independence, safety. Pt's DC plan uncertain as pt lives alone and safety may be an issue.    PT Assessment  Patient needs continued PT services    Follow Up Recommendations  SNF (unless improves signifiacantly)    Does the patient have the potential to tolerate intense rehabilitation      Barriers to Discharge Decreased caregiver support      Equipment Recommendations  None recommended by PT    Recommendations for Other Services     Frequency Min 3X/week    Precautions / Restrictions Precautions Precautions: Fall Restrictions Weight Bearing Restrictions:  Brace: has abdominal binder on.   Pertinent Vitals/Pain C/o abdominal cramps.     Mobility  Bed Mobility Bed  Mobility: Sit to Supine;Supine to Sit Supine to Sit: 1: +2 Total assist Supine to Sit: Patient Percentage: 60% Details for Bed Mobility Assistance: educated on rolling technique Transfers Sit to Stand: 1: +2 Total assist Sit to Stand: Patient Percentage: 60% Details for Transfer Assistance: pt very unsteady due to tremors Ambulation/Gait Ambulation/Gait Assistance: 1: +2 Total assist Ambulation/Gait: Patient Percentage: 60% Ambulation Distance (Feet): 3 Feet Ambulation/Gait Assistance Details: took 3 side steps with RW Gait Pattern: Step-to pattern    Exercises     PT Diagnosis: Difficulty walking;Generalized weakness  PT Problem List: Decreased strength;Decreased activity tolerance;Decreased balance;Decreased mobility;Decreased coordination;Decreased knowledge of precautions;Decreased safety awareness;Decreased knowledge of use of DME PT Treatment Interventions: DME instruction;Gait training;Functional mobility training;Therapeutic activities;Therapeutic exercise;Patient/family education     PT Goals(Current goals can be found in the care plan section) Acute Rehab PT Goals Patient Stated Goal: none stated; agreeable to OT/PT PT Goal Formulation: With patient Time For Goal Achievement: 08/30/13 Potential to Achieve Goals: Good  Visit Information  Last PT Received On: 08/16/13 Assistance Needed: +2 History of Present Illness: patient is a 57 y.o. year-old male with history of HCV, EtOH cirrhosis, GERD, bleeding ulcers, subdural hematoma, incisional hernia s/p mesh placement with subsequent infections,  who presents with recurrent syncope.  The patient was last at their baseline health several months ago.  He states he would rarely have syncope spells until the last month.  Now, they are occuring about every other day.  They normally occur when he attempts to stand from a seated position, but he denies flushing,  nausea, darkening of vision or diminished hearing.  He does not remember  falling and instead wakes up on the floor.  He has recently started losing control of his bowels and bladder during these episodes.  He denies history of seizures or drug use, however, he admits to using 6-10 beers per day.  He has not taken his home medications in a week because he ran out       Prior Functioning  Home Living Family/patient expects to be discharged to:: Private residence Living Arrangements: Alone Type of Home: House Home Access: Stairs to enter [employer] of Steps: 4 Additional Comments: ? if pt moved.  Answers provided this admission are different from last admission.  Significant other present in the beginning of session.  Pt lives alone Prior Function Level of Independence: Independent Comments: pt has had frequent falls, uncertain of independence. Communication Communication: No difficulties    Cognition  Cognition Arousal/Alertness: Awake/alert Behavior During Therapy: WFL for tasks assessed/performed Overall Cognitive Status: Difficult to assess    Extremity/Trunk Assessment Upper Extremity Assessment Upper Extremity Assessment: Generalized weakness (pt has bil tremors; did not MMT) Lower Extremity Assessment Lower Extremity Assessment: RLE deficits/detail;LLE deficits/detail RLE Deficits / Details: knee extension gives way with resistance, able to stand x 2 minutes at RW, noted tremors of leg with volitional movement. LLE Deficits / Details: same as L   Balance Balance Balance Assessed: Yes Static Sitting Balance Static Sitting - Balance Support: Bilateral upper extremity supported Static Sitting - Level of Assistance: 5: Stand by assistance Static Sitting - Comment/# of Minutes: 5 minutes.  End of Session PT - End of Session Activity Tolerance: Patient limited by fatigue Patient left: in bed;with call bell/phone within reach;with bed alarm set Nurse Communication: Mobility status  GP Functional Assessment Tool Used: clinical  judgement. Functional Limitation: Mobility: Walking and moving around Mobility: Walking and Moving Around Current Status [phone]): At least 60 percent but less than 80 percent impaired, limited or restricted Mobility: Walking and Moving Around Goal Status [phone]): At least 1 percent but less than 20 percent impaired, limited or restricted   Rada Hay 08/16/2013, 4:03 PM  Blanchard Kelch PT [phone]

## 2013-08-17 ENCOUNTER — Observation Stay (HOSPITAL_COMMUNITY): Payer: Medicaid Other

## 2013-08-17 LAB — BASIC METABOLIC PANEL
BUN: 7 mg/dL (ref 6–23)
Chloride: 103 mEq/L (ref 96–112)
GFR calc Af Amer: >90 mL/min (ref >90)
GFR calc non Af Amer: 79 mL/min — ABNORMAL LOW (ref >90)
Potassium: 3.8 mEq/L (ref 3.5–5.1)
Sodium: 135 mEq/L (ref 135–145)

## 2013-08-17 LAB — CBC
HCT: 35.9 % — ABNORMAL LOW (ref 39.0–52.0)
MCHC: 34.8 g/dL (ref 30.0–36.0)
RDW: 14 % (ref 11.5–15.5)
WBC: 2.5 10*3/uL — ABNORMAL LOW (ref 4.0–10.5)

## 2013-08-17 LAB — URINE CULTURE

## 2013-08-17 MED ORDER — POTASSIUM CHLORIDE 2 MEQ/ML IV SOLN
INTRAVENOUS | Status: DC
  Administered 2013-08-17 (×2): via INTRAVENOUS
  Filled 2013-08-17 (×3): qty 1000

## 2013-08-17 NOTE — Progress Notes (Signed)
[facility] PROGRESS NOTE  Noah LEMMERMAN ZOX:[id number] DOB: 11/11/1956 DOA: 08/15/2013 PCP: Iona Hansen, NP  Assessment/Plan:  Falls with syncope with loss of bowel and bladder control.  -Suspect a combination of gait instability, inebriation, and possibly seizures.  -Patient states that most of his episodes of dizziness and falls occur after alcohol use without any overt syncope unless he hits his head  -Orthostatic vital signs negative  -Ammonia level 52.  - Telemetry  - ECHO--EF 60-65%, grade 1 diastolic dysfunction  - EEG--no epileptiform discharges - Strongly counseled cessation from EtOH. Advised patient that if we are unable to find another cause of his syncope, that we will continue to try to help him, but until he quits alcohol, we may not be successful.  - TED hose  - TSH, RPR, B12, RBC folate--all unremarkable  - UA and UDS--negative  -MR brain--age advanced atrophy, small vessel disease, no acute abnormality EtOH and tobacco use  -No signs of withdrawal at this time - CIWA q2h for now  - Seizure precautions  - Folate, thiamine, MVI  Nausea and vomiting,  -likely related to gastritis and possible PUD - Start higher dose PPI  - Tolerating diet at this time Tremor with nystagmus  -MR brain--negative for acute abnormalities  -Suspect a component of alcohol withdrawal  Cirrhosis  -consider Restart spironoloactone +/- lasix  Thrombocytopenia, elevated bilirubin, depressed albumin, and moderately elevated INR are likely secondary to cirrhosis.  - Trend  Leukopenia  - Differential mostly normal  - Smear unremarkable  -HIV neg Mild transaminitis  -likely secondary to EtOH abuse.  Diet: Low salt  Access: PIV  IVF: off  Proph: SCD  Code Status: full  Family Communication: patient alone  Disposition Plan:  Home vs snf         Procedures/Studies: Dg Chest 2 View  08/13/2013   *RADIOLOGY REPORT*  Clinical Data: Syncope  CHEST - 2 VIEW  Comparison:  06/11/2013  Findings: The heart size and vascular pattern are normal.  The lungs are clear.  Surgical staples are seen in the gastroesophageal junction.  IMPRESSION: No acute abnormalities   Original Report Authenticated By: Esperanza Heir, M.D.   Ct Head Wo Contrast  08/15/2013   *RADIOLOGY REPORT*  Clinical Data:  Facial laceration  CT HEAD AND ORBITS WITHOUT CONTRAST  Technique:  Contiguous axial images were obtained from the base of the skull through the vertex without contrast. Multidetector CT imaging of the orbits was performed using the standard protocol without intravenous contrast.  Comparison:  CT head 08/13/2013  CT HEAD  Findings: Mild atrophy.  Negative for intracranial hemorrhage.  No acute infarct or mass.  Negative for skull fracture.  IMPRESSION: No acute abnormality.  CT ORBITS  Findings: Negative for facial fracture.  No fracture of the orbit. Nasal bone is intact.  No air-fluid level.  Nasal septum is deviated to the right.  IMPRESSION: Negative for fracture of the orbit.   Original Report Authenticated By: Janeece Riggers, M.D.   Ct Head Wo Contrast  08/13/2013   *RADIOLOGY REPORT*  Clinical Data:  Syncope.  CT HEAD WITHOUT CONTRAST CT CERVICAL SPINE WITHOUT CONTRAST  Technique:  Multidetector CT imaging of the head and cervical spine was performed following the standard protocol without intravenous contrast.  Multiplanar CT image reconstructions of the cervical spine were also generated.  Comparison:  Head CT scan 04/23/2013.  CT HEAD  Findings: There is some cortical atrophy.  No evidence of acute intracranial abnormality including infarction, hemorrhage,  mass lesion, mass effect, midline shift or abnormal extra-axial fluid collection is identified.  There is no hydrocephalus or pneumocephalus.  The calvarium is intact.  IMPRESSION: No acute finding.  CT CERVICAL SPINE  Findings: There is no fracture or subluxation of the cervical spine.  Loss of disc space height and endplate spurring  appear worst at C6-7.  Paraspinous structures are unremarkable.  IMPRESSION: No acute finding.   Original Report Authenticated By: Holley Dexter, M.D.   Ct Cervical Spine Wo Contrast  08/13/2013   *RADIOLOGY REPORT*  Clinical Data:  Syncope.  CT HEAD WITHOUT CONTRAST CT CERVICAL SPINE WITHOUT CONTRAST  Technique:  Multidetector CT imaging of the head and cervical spine was performed following the standard protocol without intravenous contrast.  Multiplanar CT image reconstructions of the cervical spine were also generated.  Comparison:  Head CT scan 04/23/2013.  CT HEAD  Findings: There is some cortical atrophy.  No evidence of acute intracranial abnormality including infarction, hemorrhage, mass lesion, mass effect, midline shift or abnormal extra-axial fluid collection is identified.  There is no hydrocephalus or pneumocephalus.  The calvarium is intact.  IMPRESSION: No acute finding.  CT CERVICAL SPINE  Findings: There is no fracture or subluxation of the cervical spine.  Loss of disc space height and endplate spurring appear worst at C6-7.  Paraspinous structures are unremarkable.  IMPRESSION: No acute finding.   Original Report Authenticated By: Holley Dexter, M.D.   Mr Brain Wo Contrast  08/17/2013   *RADIOLOGY REPORT*  Clinical Data: Falls.  Ataxia.  Tremor.  Possible seizure activity.  MRI HEAD WITHOUT CONTRAST  Technique:  Multiplanar, multiecho pulse sequences of the brain and surrounding structures were obtained according to standard protocol without intravenous contrast.  Comparison: CT head without contrast 08/15/2013.  Findings: No acute infarct, hemorrhage, or mass lesion is present. Minimal periventricular and subcortical T2 hyperintensity is patent bilaterally, slightly advanced for age.  Mild atrophy is also slightly advanced for age.  Flow is present in the major intracranial arteries.  The globes and orbits are intact.  The paranasal sinuses and left mastoid air cells are clear.  There  is some fluid in the right mastoid air cells.  No obstructing nasopharyngeal lesion is evident.  Dedicated imaging of the temporal lobes demonstrates symmetric size and signal of the hippocampal structures.  IMPRESSION:  1.  Mild age advanced atrophy and white matter disease.  This could be related to chronic alcohol abuse.  Chronic microvascular ischemic changes could have a similar effect. 2.  No acute intracranial abnormality.   Original Report Authenticated By: Marin Roberts, M.D.   Dg Abd Acute W/chest  08/15/2013   *RADIOLOGY REPORT*  Clinical Data: Fall.  Abdominal pain  ACUTE ABDOMEN SERIES (ABDOMEN 2 VIEW & CHEST 1 VIEW)  Comparison: 08/13/2013  Findings: COPD.  Lungs are clear.  Surgical clips in the GE junction.  Normal bowel gas pattern.  Negative for free air.  Right upper quadrant calcifications consistent with gallstones.  Mild scoliosis.  No acute bony abnormality.  IMPRESSION: COPD.  No active cardiopulmonary abnormality.  Gallstones  Normal bowel gas pattern.   Original Report Authenticated By: Janeece Riggers, M.D.   Ct Orbitss W/o Cm  08/15/2013   *RADIOLOGY REPORT*  Clinical Data:  Facial laceration  CT HEAD AND ORBITS WITHOUT CONTRAST  Technique:  Contiguous axial images were obtained from the base of the skull through the vertex without contrast. Multidetector CT imaging of the orbits was performed using the standard protocol without intravenous  contrast.  Comparison:  CT head 08/13/2013  CT HEAD  Findings: Mild atrophy.  Negative for intracranial hemorrhage.  No acute infarct or mass.  Negative for skull fracture.  IMPRESSION: No acute abnormality.  CT ORBITS  Findings: Negative for facial fracture.  No fracture of the orbit. Nasal bone is intact.  No air-fluid level.  Nasal septum is deviated to the right.  IMPRESSION: Negative for fracture of the orbit.   Original Report Authenticated By: Janeece Riggers, M.D.         Subjective: Patient is feeling better overall. He still has  intermittent episodes of dizziness with positional change, particularly getting up from a seated position. No further syncopal episodes. Denies fevers, chills, chest pain, shortness breath, vomiting, diarrhea. He has chronic abdominal pain which is unchanged around his ventral hernia site.  Objective: Filed Vitals:   08/17/13 0354 08/17/13 0637 08/17/13 1041 08/17/13 1309  BP: 144/60 141/59 130/59 138/56  Pulse: 75 81 75 81  Temp:  98.5 F (36.9 C)  98.1 F (36.7 C)  TempSrc:  Oral  Oral  Resp:  14  16  Height:      Weight:  93.5 kg (206 lb 2.1 oz)    SpO2:  97%  98%    Intake/Output Summary (Last 24 hours) at 08/17/13 1951 Last data filed at 08/17/13 1842  Gross per 24 hour  Intake   1460 ml  Output   2440 ml  Net   -980 ml   Weight change: -0.2 kg (-7.1 oz) Exam:   General:  Pt is alert, follows commands appropriately, not in acute distress  HEENT: No icterus, No thrush, No neck mass, [place]/AT  Cardiovascular: RRR, S1/S2, no rubs, no gallops  Respiratory: CTA bilaterally, no wheezing, no crackles, no rhonchi  Abdomen: Soft/+BS, mild tenderness to palpation along ventral hernia which is reducible., non distended, no guarding  Extremities: trace LE edema, No lymphangitis, No petechiae, No rashes, no synovitis  Data Reviewed: Basic Metabolic Panel:  Recent Labs Lab 08/13/13 1835 08/15/13 2202 08/16/13 0259 08/17/13 0950  NA 138 142 140 135  K 3.8 4.5 3.8 3.8  CL 105 110 108 103  CO2 21 23 22 29   GLUCOSE 81 79 120* 123*  BUN 10 8 8 7   CREATININE 1.10 1.12 0.97 1.02  CALCIUM 8.5 8.3* 7.8* 8.5   Liver Function Tests:  Recent Labs Lab 08/13/13 1835 08/15/13 2202 08/16/13 0259  AST 83* 88* 72*  ALT 34 34 30  ALKPHOS 85 85 75  BILITOT 1.5* 1.6* 1.4*  PROT 6.9 6.9 6.2  ALBUMIN 3.1* 2.9* 2.6*   No results found for this basename: LIPASE, AMYLASE,  in the last 168 hours  Recent Labs Lab 08/15/13 2202  AMMONIA 52   CBC:  Recent Labs Lab  08/13/13 1835 08/15/13 2202 08/16/13 0259 08/17/13 0950  WBC 4.2 2.9* 2.4* 2.5*  NEUTROABS 2.1 1.4*  --   --   HGB 12.4* 13.3 12.1* 12.5*  HCT 35.8* 38.4* 35.7* 35.9*  MCV 97.0 95.5 96.5 95.2  PLT 56* 46* 38* 33*   Cardiac Enzymes:  Recent Labs Lab 08/13/13 1835  TROPONINI <0.30   BNP: No components found with this basename: POCBNP,  CBG: No results found for this basename: GLUCAP,  in the last 168 hours  Recent Results (from the past 240 hour(s))  MRSA PCR SCREENING     Status: None   Collection Time    08/16/13  5:40 AM      Result  Value Range Status   MRSA by PCR NEGATIVE  NEGATIVE Final   Comment:            The GeneXpert MRSA Assay (FDA     approved for NASAL specimens     only), is one component of a     comprehensive MRSA colonization     surveillance program. It is not     intended to diagnose MRSA     infection nor to guide or     monitor treatment for     MRSA infections.  URINE CULTURE     Status: None   Collection Time    08/16/13  7:19 AM      Result Value Range Status   Specimen Description URINE, CLEAN CATCH   Final   Special Requests NONE   Final   Culture  Setup Time     Final   Value: 08/16/2013 12:42     Performed at [facility] Count     Final   Value: NO GROWTH     Performed at [facility]   Culture     Final   Value: NO GROWTH     Performed at [facility]   Report Status 08/17/2013 FINAL   Final     Scheduled Meds: . folic acid  1 mg Oral Daily  . LORazepam  0-4 mg Oral Q2H   Followed by  . [START ON 08/18/2013] LORazepam  0-4 mg Oral Q4H  . multivitamin with minerals  1 tablet Oral Daily  . pantoprazole  40 mg Oral BID AC  . sodium chloride  3 mL Intravenous Q12H  . thiamine  100 mg Oral Daily   Continuous Infusions: . sodium chloride 0.9 % 1,000 mL with potassium chloride 20 mEq infusion 75 mL/hr at 08/17/13 1126     Ritika Hellickson, DO  [facility] Pager [phone]  If 7PM-7AM,  please contact night-coverage [email or web address] Password [email or web address] 08/17/2013, 7:51 PM   LOS: 2 days

## 2013-08-17 NOTE — Progress Notes (Signed)
Physical Therapy Treatment Patient Details Name: Noah Medina MRN: [id number] DOB: 30-Oct-1956 Today's Date: 08/17/2013 Time: 4132-4401 PT Time Calculation (min): 24 min  PT Assessment / Plan / Recommendation  History of Present Illness patient is a 57 y.o. year-old male with history of HCV, EtOH cirrhosis, GERD, bleeding ulcers, subdural hematoma, incisional hernia s/p mesh placement with subsequent infections,  who presents with recurrent syncope.  The patient was last at their baseline health several months ago.  He states he would rarely have syncope spells until the last month.  Now, they are occuring about every other day.  They normally occur when he attempts to stand from a seated position, but he denies flushing, nausea, darkening of vision or diminished hearing.  He does not remember falling and instead wakes up on the floor.  He has recently started losing control of his bowels and bladder during these episodes.  He denies history of seizures or drug use, however, he admits to using 6-10 beers per day.  He has not taken his home medications in a week because he ran out   PT Comments   Pt more alert/aware requesting to get out of bed and agreeable to walk.  Assisted to [facility] for toileting and wash up as pt was found soaked in urine.  Pt has difficulty using urinal due to tremors. Amb pt in hallway + 2 assist for safety.  Tremors throughout. Unsteady gait but progressing from last visit.   Follow Up Recommendations  SNF  Home Health PT if able to have initial family support Pt mentioned a sister who is caring for his 6 cats right now     Does the patient have the potential to tolerate intense rehabilitation     Barriers to Discharge        Equipment Recommendations  None recommended by PT (pt has a RW at home)    Recommendations for Other Services    Frequency Min 3X/week   Progress towards PT Goals Progress towards PT goals: Progressing toward goals  Plan      Precautions /  Restrictions     Pertinent Vitals/Pain C/o ABD pain/reported to RN.    Mobility  Bed Mobility Bed Mobility: Supine to Sit Supine to Sit: 5: Supervision Details for Bed Mobility Assistance: increased time due to ABD pain Transfers Sit to Stand: 3: Mod assist;From toilet;From bed Details for Transfer Assistance: pt very unsteady due to tremors Ambulation/Gait Ambulation/Gait Assistance: 1: +2 Total assist Ambulation/Gait: Patient Percentage: 80% Ambulation Distance (Feet): 185 Feet Assistive device: Rolling walker Ambulation/Gait Assistance Details: tremors throughout and very unsteady gait.  Pt c/o mild dizzyness.  Second assist following with recliner for safety. Gait Pattern: Step-to pattern Gait velocity: decreased    PT Goals (current goals can now be found in the care plan section)    Visit Information  Last PT Received On: 08/17/13 Assistance Needed: +2 (for amb) History of Present Illness: patient is a 57 y.o. year-old male with history of HCV, EtOH cirrhosis, GERD, bleeding ulcers, subdural hematoma, incisional hernia s/p mesh placement with subsequent infections,  who presents with recurrent syncope.  The patient was last at their baseline health several months ago.  He states he would rarely have syncope spells until the last month.  Now, they are occuring about every other day.  They normally occur when he attempts to stand from a seated position, but he denies flushing, nausea, darkening of vision or diminished hearing.  He does not remember falling and instead  wakes up on the floor.  He has recently started losing control of his bowels and bladder during these episodes.  He denies history of seizures or drug use, however, he admits to using 6-10 beers per day.  He has not taken his home medications in a week because he ran out    Subjective Data      Cognition       Balance     End of Session PT - End of Session Equipment Utilized During Treatment: Gait  belt Activity Tolerance: Patient tolerated treatment well Patient left: in chair;with call bell/phone within reach   Felecia Shelling  PTA [facility]  Acute  Rehab Pager      [phone]

## 2013-08-18 LAB — BASIC METABOLIC PANEL
Calcium: 8.3 mg/dL — ABNORMAL LOW (ref 8.4–10.5)
Creatinine, Ser: 1.1 mg/dL (ref 0.50–1.35)
GFR calc non Af Amer: 72 mL/min — ABNORMAL LOW (ref >90)
Glucose, Bld: 101 mg/dL — ABNORMAL HIGH (ref 70–99)
Sodium: 138 mEq/L (ref 135–145)

## 2013-08-18 LAB — CBC
MCH: 32.8 pg (ref 26.0–34.0)
MCHC: 34.1 g/dL (ref 30.0–36.0)
MCV: 96.1 fL (ref 78.0–100.0)
Platelets: 37 10*3/uL — ABNORMAL LOW (ref 150–400)

## 2013-08-18 MED ORDER — POTASSIUM CHLORIDE IN NACL 20-0.9 MEQ/L-% IV SOLN
INTRAVENOUS | Status: DC
  Administered 2013-08-20: 75 mL/h via INTRAVENOUS
  Filled 2013-08-18 (×5): qty 1000

## 2013-08-18 NOTE — Progress Notes (Signed)
CSW spoke with patient & provided bed offers - CSW had to expand the bed search & was only able to get a bed offer at [facility] in [place]. Patient states that that would be too far and he'd rather return home, that his girlfriend Delaney Meigs (ph#: [phone]) would transport him home and stay with him. RNCM, Cookie & Dr. Arbutus Leas made aware of patient's plans to go home. Anticipating discharge this afternoon. CSW signing off.   Unice Bailey, LCSW [facility] Clinical Social Worker cell #: [phone]

## 2013-08-18 NOTE — Progress Notes (Signed)
Clinical Social Work Department BRIEF PSYCHOSOCIAL ASSESSMENT 08/18/2013  Patient:  Noah Medina, Noah Medina     Account Number:  [id number]     Admit date:  08/15/2013  Clinical Social Worker:  Orpah Greek  Date/Time:  08/18/2013 10:13 AM  Referred by:  Physician  Date Referred:  08/18/2013 Referred for  SNF Placement  Substance Abuse   Other Referral:   Interview type:  Patient Other interview type:    PSYCHOSOCIAL DATA Living Status:  ALONE Admitted from facility:   Level of care:   Primary support name:  Noah Medina (sister) ph#: [phone] Primary support relationship to patient:  SIBLING Degree of support available:   good    CURRENT CONCERNS Current Concerns  Post-Acute Placement  Substance Abuse   Other Concerns:    SOCIAL WORK ASSESSMENT / PLAN CSW received referrals for ETOH abuse & SNF placement. PT evaluated patient & recommended SNF due to patient requiring 2+ assist.   Assessment/plan status:  Information/Referral to [facility] Other assessment/ plan:   Information/referral to community resources:   CSW completed FL2 and faxed information out to [facility] - calling around to see which facilities would be able to offer a bed though may be hard to place due to his ETOH abuse & Medicaid as his primary payor.    PATIENT'S/FAMILY'S RESPONSE TO PLAN OF CARE: CSW completed SBIRT with patient (see Doc Flowsheets), patient denies that he has a problem with drinking, stating that he only drinks 2 or 3 times a week.       Unice Bailey, LCSW [facility] Clinical Social Worker cell #: [phone]

## 2013-08-18 NOTE — Progress Notes (Signed)
Clinical Social Work Department CLINICAL SOCIAL WORK PLACEMENT NOTE 08/18/2013  Patient:  BLAYN, WHETSELL  Account Number:  [id number] Admit date:  08/15/2013  Clinical Social Worker:  Orpah Greek  Date/time:  08/18/2013 10:18 AM  Clinical Social Work is seeking post-discharge placement for this patient at the following level of care:   SKILLED NURSING   (*CSW will update this form in Epic as items are completed)   08/18/2013  Patient/family provided with [facility] Department of Clinical Social Work's list of facilities offering this level of care within the geographic area requested by the patient (or if unable, by the patient's family).  08/18/2013  Patient/family informed of their freedom to choose among providers that offer the needed level of care, that participate in Medicare, Medicaid or managed care program needed by the patient, have an available bed and are willing to accept the patient.  08/18/2013  Patient/family informed of [facility]' ownership interest in [facility], as well as of the fact that they are under no obligation to receive care at this facility.  PASARR submitted to EDS on 08/18/2013 PASARR number received from EDS on 08/18/2013  FL2 transmitted to all facilities in geographic area requested by pt/family on  08/18/2013 FL2 transmitted to all facilities within larger geographic area on   Patient informed that his/her managed care company has contracts with or will negotiate with  certain facilities, including the following:   Medicaid only     Patient/family informed of bed offers received:   Patient chooses bed at  Physician recommends and patient chooses bed at    Patient to be transferred to  on   Patient to be transferred to facility by   The following physician request were entered in Epic:   Additional Comments:   Unice Bailey, LCSW [facility] Clinical Social Worker cell #: [phone]

## 2013-08-18 NOTE — Progress Notes (Signed)
HH referral given to [facility].

## 2013-08-18 NOTE — Discharge Summary (Signed)
Physician Discharge Summary  Noah Medina WUJ:[id number] DOB: Feb 01, 1956 DOA: 08/15/2013  PCP: Iona Hansen, NP  Admit date: 08/15/2013 Discharge date: 08/18/2013  Recommendations for Outpatient Follow-up:  1. Pt will need to follow up with PCP in 2 weeks post discharge 2. Please obtain BMP to evaluate electrolytes and kidney function 3. Please also check CBC to evaluate Hg and Hct levels   Discharge Diagnoses:  Principal Problem:   Syncope Active Problems:   Cirrhosis   Cirrhosis of liver with history of hepatitis C   Severe malnutrition   Ascites   Fall   Thrombocytopenia, unspecified   Alcohol abuse   GERD (gastroesophageal reflux disease)   Nausea and vomiting   Transaminitis Falls with syncope with loss of bowel and bladder control.  -Suspect a combination of gait instability, inebriation, and possibly seizures.  -Patient states that most of his episodes of dizziness and falls occur after alcohol use without any overt syncope unless he hits his head  -Orthostatic vital signs negative although his symptoms improved with fluid hydration -Ammonia level 52--nonspecific--pt is lucid - Telemetry--no ectopy, remained sinus  - ECHO--EF 60-65%, grade 1 diastolic dysfunction  - EEG--no epileptiform discharges  - Strongly counseled cessation from EtOH. Advised patient that if we are unable to find another cause of his syncope, that we will continue to try to help him, but until he quits alcohol, we may not be successful.  - TSH, RPR, B12, RBC folate--all unremarkable  - UA and UDS--negative  -MR brain--age advanced atrophy, small vessel disease, no acute abnormality  -Physical therapy was consulted; they recommended skilled nursing facility; -Patient's options are limited due to insurance. He ultimately refused to go to the available facilities provided to him by social work. -Patient will be sent home with home health physical therapy EtOH and tobacco use  -No signs of withdrawal  at this time  - CIWA q2h for now  - Seizure precautions  - Folate, thiamine, MVI  Nausea and vomiting,  -likely related to gastritis and possible PUD - continue PPI  - Tolerating diet at this time  Tremor with nystagmus  -MR brain--negative for acute abnormalities  -Suspect a component of alcohol withdrawal  -On progressive days in hospital, nystagmus improved -pt has hx of intention tremor--suspect essential tremor Liver Cirrhosis  -Thrombocytopenia, elevated bilirubin, depressed albumin, and moderately elevated INR are likely secondary to cirrhosis.   -Bili was 1.6 at time of admission -Platelets remained stable without any active signs of bleed Leukopenia  - WBC remained stable throughout the hospitalization - Smear unremarkable  -HIV neg  Mild transaminitis  -likely secondary to EtOH abuse Deconditioning/gait instability -PT evaluation has recommended skilled nursing facility.  Diet: Low salt  Access: PIV  Code Status: full  Family Communication: patient alone    Discharge Condition: stable  Disposition: pt refused SNF-->home with home health  Diet:regular Wt Readings from Last 3 Encounters:  08/18/13 93.26 kg (205 lb 9.6 oz)  06/11/13 90.719 kg (200 lb)  04/24/13 90.2 kg (198 lb 13.7 oz)    History of present illness:  57 y.o. year-old male with history of HCV, EtOH cirrhosis, GERD, bleeding ulcers, subdural hematoma, incisional hernia s/p mesh placement with subsequent infections, who presents with recurrent syncope. The patient was last at their baseline health several months ago. He states he would rarely have syncope spells until the last month. Now, they are occuring about every other day. They normally occur when he attempts to stand from a seated position,  but he denies flushing, nausea, darkening of vision or diminished hearing. He does not remember falling and instead wakes up on the floor. He has recently started losing control of his bowels and bladder  during these episodes. He denies history of seizures or drug use, however, he admits to using 6-10 beers per day. He has not taken his home medications in a week because he ran out. He has had some nausea, vomiting, nonbilious and nonbloody, particularly after meals and poor appetite. Had some diarrhea1 week before admission which has since resolved.  In the ER, WBC 2.9 (lower than previous), hgb 13.3, plt 46 (stable), AST 88, ALT 34, bili 1.6 (also stable), albumin 2.9. INR from a few days ago was 1.43. EtOH 244. CXR: COPD. KUB: Gallstones and normal gas pattern. CT head and orbits: Mild atrophy but no acute fractures, hemorrhages, or changes. Troponin neg. ECG NSR. Patient is being admitted for syncope work up.  Upon further questioning, the patient states that he has had a history of recurrent falls without any actual syncope. He does relate some dizziness and orthostatic type symptoms with positional change from a seated to standing position. He states that he does not have any loss of consciousness after falls unless he has had his head.    Discharge Exam: Filed Vitals:   08/18/13 1333  BP: 136/52  Pulse: 83  Temp: 98.5 F (36.9 C)  Resp: 20   Filed Vitals:   08/17/13 1309 08/17/13 2200 08/18/13 0532 08/18/13 1333  BP: 138/56 140/62 132/61 136/52  Pulse: 81 81 77 83  Temp: 98.1 F (36.7 C) 99 F (37.2 C) 98.4 F (36.9 C) 98.5 F (36.9 C)  TempSrc: Oral Oral Oral Oral  Resp: 16 20 20 20   Height:      Weight:   93.26 kg (205 lb 9.6 oz)   SpO2: 98% 99% 98% 98%   General: A&O x 3, NAD, pleasant, cooperative Cardiovascular: RRR, no rub, no gallop, no S3 Respiratory: Bibasilar crackles. No wheezes. Good air movement. Abdomen:soft, mild tenderness along ventral hernia. Easily reducible., nondistended, positive bowel sounds Extremities: No edema, No lymphangitis, no petechiae  Discharge Instructions      Discharge Orders   Future Appointments Provider Department Dept Phone    08/28/2013 10:40 AM Shelly Rubenstein, MD [facility], [place] [phone]   Future Orders Complete By Expires   Diet - low sodium heart healthy  As directed    Increase activity slowly  As directed        Medication List         folic acid 1 MG tablet  Commonly known as:  FOLVITE  Take 1 tablet (1 mg total) by mouth daily.     multivitamin with minerals Tabs tablet  Take 1 tablet by mouth daily.     omeprazole 20 MG tablet  Commonly known as:  PRILOSEC OTC  Take 20 mg by mouth daily.     oxyCODONE 5 MG immediate release tablet  Commonly known as:  Oxy IR/ROXICODONE  Take 1 tablet (5 mg total) by mouth every 4 (four) hours as needed for pain.     spironolactone 25 MG tablet  Commonly known as:  ALDACTONE  Take 25 mg by mouth daily.     thiamine 100 MG tablet  Take 1 tablet (100 mg total) by mouth daily.         The results of significant diagnostics from this hospitalization (including imaging, microbiology, ancillary and laboratory) are listed  below for reference.    Significant Diagnostic Studies: Dg Chest 2 View  08/13/2013   *RADIOLOGY REPORT*  Clinical Data: Syncope  CHEST - 2 VIEW  Comparison: 06/11/2013  Findings: The heart size and vascular pattern are normal.  The lungs are clear.  Surgical staples are seen in the gastroesophageal junction.  IMPRESSION: No acute abnormalities   Original Report Authenticated By: Esperanza Heir, M.D.   Ct Head Wo Contrast  08/15/2013   *RADIOLOGY REPORT*  Clinical Data:  Facial laceration  CT HEAD AND ORBITS WITHOUT CONTRAST  Technique:  Contiguous axial images were obtained from the base of the skull through the vertex without contrast. Multidetector CT imaging of the orbits was performed using the standard protocol without intravenous contrast.  Comparison:  CT head 08/13/2013  CT HEAD  Findings: Mild atrophy.  Negative for intracranial hemorrhage.  No acute infarct or mass.  Negative for skull fracture.  IMPRESSION: No  acute abnormality.  CT ORBITS  Findings: Negative for facial fracture.  No fracture of the orbit. Nasal bone is intact.  No air-fluid level.  Nasal septum is deviated to the right.  IMPRESSION: Negative for fracture of the orbit.   Original Report Authenticated By: Janeece Riggers, M.D.   Ct Head Wo Contrast  08/13/2013   *RADIOLOGY REPORT*  Clinical Data:  Syncope.  CT HEAD WITHOUT CONTRAST CT CERVICAL SPINE WITHOUT CONTRAST  Technique:  Multidetector CT imaging of the head and cervical spine was performed following the standard protocol without intravenous contrast.  Multiplanar CT image reconstructions of the cervical spine were also generated.  Comparison:  Head CT scan 04/23/2013.  CT HEAD  Findings: There is some cortical atrophy.  No evidence of acute intracranial abnormality including infarction, hemorrhage, mass lesion, mass effect, midline shift or abnormal extra-axial fluid collection is identified.  There is no hydrocephalus or pneumocephalus.  The calvarium is intact.  IMPRESSION: No acute finding.  CT CERVICAL SPINE  Findings: There is no fracture or subluxation of the cervical spine.  Loss of disc space height and endplate spurring appear worst at C6-7.  Paraspinous structures are unremarkable.  IMPRESSION: No acute finding.   Original Report Authenticated By: Holley Dexter, M.D.   Ct Cervical Spine Wo Contrast  08/13/2013   *RADIOLOGY REPORT*  Clinical Data:  Syncope.  CT HEAD WITHOUT CONTRAST CT CERVICAL SPINE WITHOUT CONTRAST  Technique:  Multidetector CT imaging of the head and cervical spine was performed following the standard protocol without intravenous contrast.  Multiplanar CT image reconstructions of the cervical spine were also generated.  Comparison:  Head CT scan 04/23/2013.  CT HEAD  Findings: There is some cortical atrophy.  No evidence of acute intracranial abnormality including infarction, hemorrhage, mass lesion, mass effect, midline shift or abnormal extra-axial fluid  collection is identified.  There is no hydrocephalus or pneumocephalus.  The calvarium is intact.  IMPRESSION: No acute finding.  CT CERVICAL SPINE  Findings: There is no fracture or subluxation of the cervical spine.  Loss of disc space height and endplate spurring appear worst at C6-7.  Paraspinous structures are unremarkable.  IMPRESSION: No acute finding.   Original Report Authenticated By: Holley Dexter, M.D.   Mr Brain Wo Contrast  08/17/2013   *RADIOLOGY REPORT*  Clinical Data: Falls.  Ataxia.  Tremor.  Possible seizure activity.  MRI HEAD WITHOUT CONTRAST  Technique:  Multiplanar, multiecho pulse sequences of the brain and surrounding structures were obtained according to standard protocol without intravenous contrast.  Comparison: CT head without contrast  08/15/2013.  Findings: No acute infarct, hemorrhage, or mass lesion is present. Minimal periventricular and subcortical T2 hyperintensity is patent bilaterally, slightly advanced for age.  Mild atrophy is also slightly advanced for age.  Flow is present in the major intracranial arteries.  The globes and orbits are intact.  The paranasal sinuses and left mastoid air cells are clear.  There is some fluid in the right mastoid air cells.  No obstructing nasopharyngeal lesion is evident.  Dedicated imaging of the temporal lobes demonstrates symmetric size and signal of the hippocampal structures.  IMPRESSION:  1.  Mild age advanced atrophy and white matter disease.  This could be related to chronic alcohol abuse.  Chronic microvascular ischemic changes could have a similar effect. 2.  No acute intracranial abnormality.   Original Report Authenticated By: Marin Roberts, M.D.   Dg Abd Acute W/chest  08/15/2013   *RADIOLOGY REPORT*  Clinical Data: Fall.  Abdominal pain  ACUTE ABDOMEN SERIES (ABDOMEN 2 VIEW & CHEST 1 VIEW)  Comparison: 08/13/2013  Findings: COPD.  Lungs are clear.  Surgical clips in the GE junction.  Normal bowel gas pattern.   Negative for free air.  Right upper quadrant calcifications consistent with gallstones.  Mild scoliosis.  No acute bony abnormality.  IMPRESSION: COPD.  No active cardiopulmonary abnormality.  Gallstones  Normal bowel gas pattern.   Original Report Authenticated By: Janeece Riggers, M.D.   Ct Orbitss W/o Cm  08/15/2013   *RADIOLOGY REPORT*  Clinical Data:  Facial laceration  CT HEAD AND ORBITS WITHOUT CONTRAST  Technique:  Contiguous axial images were obtained from the base of the skull through the vertex without contrast. Multidetector CT imaging of the orbits was performed using the standard protocol without intravenous contrast.  Comparison:  CT head 08/13/2013  CT HEAD  Findings: Mild atrophy.  Negative for intracranial hemorrhage.  No acute infarct or mass.  Negative for skull fracture.  IMPRESSION: No acute abnormality.  CT ORBITS  Findings: Negative for facial fracture.  No fracture of the orbit. Nasal bone is intact.  No air-fluid level.  Nasal septum is deviated to the right.  IMPRESSION: Negative for fracture of the orbit.   Original Report Authenticated By: Janeece Riggers, M.D.     Microbiology: Recent Results (from the past 240 hour(s))  MRSA PCR SCREENING     Status: None   Collection Time    08/16/13  5:40 AM      Result Value Range Status   MRSA by PCR NEGATIVE  NEGATIVE Final   Comment:            The GeneXpert MRSA Assay (FDA     approved for NASAL specimens     only), is one component of a     comprehensive MRSA colonization     surveillance program. It is not     intended to diagnose MRSA     infection nor to guide or     monitor treatment for     MRSA infections.  URINE CULTURE     Status: None   Collection Time    08/16/13  7:19 AM      Result Value Range Status   Specimen Description URINE, CLEAN CATCH   Final   Special Requests NONE   Final   Culture  Setup Time     Final   Value: 08/16/2013 12:42     Performed at [facility] Count     Final   Value:  NO GROWTH     Performed at [facility]   Culture     Final   Value: NO GROWTH     Performed at [facility]   Report Status 08/17/2013 FINAL   Final     Labs: Basic Metabolic Panel:  Recent Labs Lab 08/13/13 1835 08/15/13 2202 08/16/13 0259 08/17/13 0950 08/18/13 0403  NA 138 142 140 135 138  K 3.8 4.5 3.8 3.8 3.9  CL 105 110 108 103 106  CO2 21 23 22 29 28   GLUCOSE 81 79 120* 123* 101*  BUN 10 8 8 7 6   CREATININE 1.10 1.12 0.97 1.02 1.10  CALCIUM 8.5 8.3* 7.8* 8.5 8.3*   Liver Function Tests:  Recent Labs Lab 08/13/13 1835 08/15/13 2202 08/16/13 0259  AST 83* 88* 72*  ALT 34 34 30  ALKPHOS 85 85 75  BILITOT 1.5* 1.6* 1.4*  PROT 6.9 6.9 6.2  ALBUMIN 3.1* 2.9* 2.6*   No results found for this basename: LIPASE, AMYLASE,  in the last 168 hours  Recent Labs Lab 08/15/13 2202  AMMONIA 52   CBC:  Recent Labs Lab 08/13/13 1835 08/15/13 2202 08/16/13 0259 08/17/13 0950 08/18/13 0403  WBC 4.2 2.9* 2.4* 2.5* 2.2*  NEUTROABS 2.1 1.4*  --   --   --   HGB 12.4* 13.3 12.1* 12.5* 11.8*  HCT 35.8* 38.4* 35.7* 35.9* 34.6*  MCV 97.0 95.5 96.5 95.2 96.1  PLT 56* 46* 38* 33* 37*   Cardiac Enzymes:  Recent Labs Lab 08/13/13 1835  TROPONINI <0.30   BNP: No components found with this basename: POCBNP,  CBG: No results found for this basename: GLUCAP,  in the last 168 hours  Time coordinating discharge:  Greater than 30 minutes  Signed:  Ja Ohman, DO [facility] Pager: [phone] 08/18/2013, 2:35 PM

## 2013-08-19 ENCOUNTER — Inpatient Hospital Stay (HOSPITAL_COMMUNITY): Payer: Medicaid Other

## 2013-08-19 DIAGNOSIS — R109 Unspecified abdominal pain: Secondary | ICD-10-CM

## 2013-08-19 MED ORDER — SENNA 8.6 MG PO TABS
2.0000 | ORAL_TABLET | Freq: Every day | ORAL | Status: DC
  Administered 2013-08-20: 17.2 mg via ORAL
  Filled 2013-08-19: qty 2

## 2013-08-19 MED ORDER — COSYNTROPIN 0.25 MG IJ SOLR
0.2500 mg | Freq: Once | INTRAMUSCULAR | Status: AC
  Administered 2013-08-20: 06:00:00 0.25 mg via INTRAVENOUS
  Filled 2013-08-19: qty 0.25

## 2013-08-19 MED ORDER — DOCUSATE SODIUM 100 MG PO CAPS
100.0000 mg | ORAL_CAPSULE | Freq: Two times a day (BID) | ORAL | Status: DC
  Administered 2013-08-19 – 2013-08-20 (×3): 100 mg via ORAL
  Filled 2013-08-19 (×4): qty 1

## 2013-08-19 NOTE — Progress Notes (Signed)
Per MD, Pt changed his mind and wants to go to [facility].  Spoke with the supervisor at [facility], Hilda Lias, who stated that admission over the weekend is not possible but that they'd be happy to consider him for admission on Monday.  Notified MD.  Providence Crosby, LCSWA Clinical Social Work [phone]

## 2013-08-19 NOTE — Progress Notes (Signed)
[facility] PROGRESS NOTE  Noah Medina ZOX:[id number] DOB: 10/18/1956 DOA: 08/15/2013 PCP: Iona Hansen, NP  Assessment/Plan: Falls with syncope with loss of bowel and bladder control.  -Suspect a combination of gait instability, inebriation, and possibly seizures.  -Patient states that most of his episodes of dizziness and falls occur after alcohol use without any overt syncope unless he hits his head  -Orthostatic vital signs negative although his symptoms improved with fluid hydration  -CT brain negative for acute abnormalities. CT orbits negative for fracture -Ammonia level 52--nonspecific--pt is lucid  - Telemetry--no ectopy, remained sinus  - ECHO--EF 60-65%, grade 1 diastolic dysfunction  - EEG--no epileptiform discharges  - Strongly counseled cessation from EtOH. Advised patient that if we are unable to find another cause of his syncope, that we will continue to try to help him, but until he quits alcohol, we may not be successful.  - TSH, RPR, B12, RBC folate--all unremarkable  - UA and UDS--negative  -MR brain--age advanced atrophy, small vessel disease, no acute abnormality  -Physical therapy was consulted; they recommended skilled nursing facility;  -Patient's options are limited due to insurance. He ultimately refused to go to the available facilities provided to him by social work.  -Patient subsequently changed his mind and wanted to go to a skilled nursing facility. EtOH and tobacco use  -No signs of withdrawal at this time  - CIWA q2h for now  - Seizure precautions  - Folate, thiamine, MVI  Nausea and vomiting,  -resolved -likely related to gastritis and possible PUD - continue PPI  - Tolerating diet at this time  Abdominal pain -abdominal xray -suspect constipation -start colace and senna Tremor with nystagmus  -MR brain--negative for acute abnormalities  -Suspect a component of alcohol withdrawal  -On progressive days in hospital, nystagmus improved   -pt has hx of intention tremor--suspect essential tremor  Liver Cirrhosis  -Thrombocytopenia, elevated bilirubin, depressed albumin, and moderately elevated INR are likely secondary to cirrhosis.  -Bili was 1.6 at time of admission  -Platelets remained stable without any active signs of bleed  Leukopenia  - WBC remained stable throughout the hospitalization  - Smear unremarkable  -HIV neg  Mild transaminitis  -likely secondary to EtOH abuse  Deconditioning/gait instability  -PT evaluation has recommended skilled nursing facility.  -Serum B12--654, RBC folate of 15, RPR negative Diet: Low salt  Access: PIV  Code Status: full  Family Communication: patient alone            Procedures/Studies: Dg Chest 2 View  08/13/2013   *RADIOLOGY REPORT*  Clinical Data: Syncope  CHEST - 2 VIEW  Comparison: 06/11/2013  Findings: The heart size and vascular pattern are normal.  The lungs are clear.  Surgical staples are seen in the gastroesophageal junction.  IMPRESSION: No acute abnormalities   Original Report Authenticated By: Esperanza Heir, M.D.   Ct Head Wo Contrast  08/15/2013   *RADIOLOGY REPORT*  Clinical Data:  Facial laceration  CT HEAD AND ORBITS WITHOUT CONTRAST  Technique:  Contiguous axial images were obtained from the base of the skull through the vertex without contrast. Multidetector CT imaging of the orbits was performed using the standard protocol without intravenous contrast.  Comparison:  CT head 08/13/2013  CT HEAD  Findings: Mild atrophy.  Negative for intracranial hemorrhage.  No acute infarct or mass.  Negative for skull fracture.  IMPRESSION: No acute abnormality.  CT ORBITS  Findings: Negative for facial fracture.  No fracture of the orbit. Nasal bone  is intact.  No air-fluid level.  Nasal septum is deviated to the right.  IMPRESSION: Negative for fracture of the orbit.   Original Report Authenticated By: Janeece Riggers, M.D.   Ct Head Wo Contrast  08/13/2013   *RADIOLOGY  REPORT*  Clinical Data:  Syncope.  CT HEAD WITHOUT CONTRAST CT CERVICAL SPINE WITHOUT CONTRAST  Technique:  Multidetector CT imaging of the head and cervical spine was performed following the standard protocol without intravenous contrast.  Multiplanar CT image reconstructions of the cervical spine were also generated.  Comparison:  Head CT scan 04/23/2013.  CT HEAD  Findings: There is some cortical atrophy.  No evidence of acute intracranial abnormality including infarction, hemorrhage, mass lesion, mass effect, midline shift or abnormal extra-axial fluid collection is identified.  There is no hydrocephalus or pneumocephalus.  The calvarium is intact.  IMPRESSION: No acute finding.  CT CERVICAL SPINE  Findings: There is no fracture or subluxation of the cervical spine.  Loss of disc space height and endplate spurring appear worst at C6-7.  Paraspinous structures are unremarkable.  IMPRESSION: No acute finding.   Original Report Authenticated By: Holley Dexter, M.D.   Ct Cervical Spine Wo Contrast  08/13/2013   *RADIOLOGY REPORT*  Clinical Data:  Syncope.  CT HEAD WITHOUT CONTRAST CT CERVICAL SPINE WITHOUT CONTRAST  Technique:  Multidetector CT imaging of the head and cervical spine was performed following the standard protocol without intravenous contrast.  Multiplanar CT image reconstructions of the cervical spine were also generated.  Comparison:  Head CT scan 04/23/2013.  CT HEAD  Findings: There is some cortical atrophy.  No evidence of acute intracranial abnormality including infarction, hemorrhage, mass lesion, mass effect, midline shift or abnormal extra-axial fluid collection is identified.  There is no hydrocephalus or pneumocephalus.  The calvarium is intact.  IMPRESSION: No acute finding.  CT CERVICAL SPINE  Findings: There is no fracture or subluxation of the cervical spine.  Loss of disc space height and endplate spurring appear worst at C6-7.  Paraspinous structures are unremarkable.   IMPRESSION: No acute finding.   Original Report Authenticated By: Holley Dexter, M.D.   Mr Brain Wo Contrast  08/17/2013   *RADIOLOGY REPORT*  Clinical Data: Falls.  Ataxia.  Tremor.  Possible seizure activity.  MRI HEAD WITHOUT CONTRAST  Technique:  Multiplanar, multiecho pulse sequences of the brain and surrounding structures were obtained according to standard protocol without intravenous contrast.  Comparison: CT head without contrast 08/15/2013.  Findings: No acute infarct, hemorrhage, or mass lesion is present. Minimal periventricular and subcortical T2 hyperintensity is patent bilaterally, slightly advanced for age.  Mild atrophy is also slightly advanced for age.  Flow is present in the major intracranial arteries.  The globes and orbits are intact.  The paranasal sinuses and left mastoid air cells are clear.  There is some fluid in the right mastoid air cells.  No obstructing nasopharyngeal lesion is evident.  Dedicated imaging of the temporal lobes demonstrates symmetric size and signal of the hippocampal structures.  IMPRESSION:  1.  Mild age advanced atrophy and white matter disease.  This could be related to chronic alcohol abuse.  Chronic microvascular ischemic changes could have a similar effect. 2.  No acute intracranial abnormality.   Original Report Authenticated By: Marin Roberts, M.D.   Dg Abd Acute W/chest  08/15/2013   *RADIOLOGY REPORT*  Clinical Data: Fall.  Abdominal pain  ACUTE ABDOMEN SERIES (ABDOMEN 2 VIEW & CHEST 1 VIEW)  Comparison: 08/13/2013  Findings: COPD.  Lungs are clear.  Surgical clips in the GE junction.  Normal bowel gas pattern.  Negative for free air.  Right upper quadrant calcifications consistent with gallstones.  Mild scoliosis.  No acute bony abnormality.  IMPRESSION: COPD.  No active cardiopulmonary abnormality.  Gallstones  Normal bowel gas pattern.   Original Report Authenticated By: Janeece Riggers, M.D.   Ct Orbitss W/o Cm  08/15/2013   *RADIOLOGY REPORT*   Clinical Data:  Facial laceration  CT HEAD AND ORBITS WITHOUT CONTRAST  Technique:  Contiguous axial images were obtained from the base of the skull through the vertex without contrast. Multidetector CT imaging of the orbits was performed using the standard protocol without intravenous contrast.  Comparison:  CT head 08/13/2013  CT HEAD  Findings: Mild atrophy.  Negative for intracranial hemorrhage.  No acute infarct or mass.  Negative for skull fracture.  IMPRESSION: No acute abnormality.  CT ORBITS  Findings: Negative for facial fracture.  No fracture of the orbit. Nasal bone is intact.  No air-fluid level.  Nasal septum is deviated to the right.  IMPRESSION: Negative for fracture of the orbit.   Original Report Authenticated By: Janeece Riggers, M.D.         Subjective: Patient continues to complain of dizziness with positional changes. No further syncope. Still feels unsteady on his feet. Denies any chest discomfort, tightness breath, nausea, vomiting, diarrhea. He complains of some abdominal pain and has not had a bowel movement in 4 days.  Objective: Filed Vitals:   08/18/13 0532 08/18/13 1333 08/18/13 2213 08/19/13 0653  BP: 132/61 136/52 131/64 140/67  Pulse: 77 83 86 79  Temp: 98.4 F (36.9 C) 98.5 F (36.9 C) 98 F (36.7 C) 98.6 F (37 C)  TempSrc: Oral Oral Oral Oral  Resp: 20 20 16 16   Height:      Weight: 93.26 kg (205 lb 9.6 oz)   92.2 kg (203 lb 4.2 oz)  SpO2: 98% 98% 98% 97%    Intake/Output Summary (Last 24 hours) at 08/19/13 1610 Last data filed at 08/19/13 0657  Gross per 24 hour  Intake    700 ml  Output   2010 ml  Net  -1310 ml   Weight change: -1.059 kg (-2 lb 5.4 oz) Exam:   General:  Pt is alert, follows commands appropriately, not in acute distress  HEENT: No icterus, No thrush, No neck mass, [place]/AT  Cardiovascular: RRR, S1/S2, no rubs, no gallops  Respiratory: CTA bilaterally, no wheezing, no crackles, no rhonchi  Abdomen: Soft/+BS, non tender, non  distended, no guarding  Extremities: No edema, No lymphangitis, No petechiae, No rashes, no synovitis  Data Reviewed: Basic Metabolic Panel:  Recent Labs Lab 08/13/13 1835 08/15/13 2202 08/16/13 0259 08/17/13 0950 08/18/13 0403  NA 138 142 140 135 138  K 3.8 4.5 3.8 3.8 3.9  CL 105 110 108 103 106  CO2 21 23 22 29 28   GLUCOSE 81 79 120* 123* 101*  BUN 10 8 8 7 6   CREATININE 1.10 1.12 0.97 1.02 1.10  CALCIUM 8.5 8.3* 7.8* 8.5 8.3*   Liver Function Tests:  Recent Labs Lab 08/13/13 1835 08/15/13 2202 08/16/13 0259  AST 83* 88* 72*  ALT 34 34 30  ALKPHOS 85 85 75  BILITOT 1.5* 1.6* 1.4*  PROT 6.9 6.9 6.2  ALBUMIN 3.1* 2.9* 2.6*   No results found for this basename: LIPASE, AMYLASE,  in the last 168 hours  Recent Labs Lab 08/15/13 2202  AMMONIA 52  CBC:  Recent Labs Lab 08/13/13 1835 08/15/13 2202 08/16/13 0259 08/17/13 0950 08/18/13 0403  WBC 4.2 2.9* 2.4* 2.5* 2.2*  NEUTROABS 2.1 1.4*  --   --   --   HGB 12.4* 13.3 12.1* 12.5* 11.8*  HCT 35.8* 38.4* 35.7* 35.9* 34.6*  MCV 97.0 95.5 96.5 95.2 96.1  PLT 56* 46* 38* 33* 37*   Cardiac Enzymes:  Recent Labs Lab 08/13/13 1835  TROPONINI <0.30   BNP: No components found with this basename: POCBNP,  CBG: No results found for this basename: GLUCAP,  in the last 168 hours  Recent Results (from the past 240 hour(s))  MRSA PCR SCREENING     Status: None   Collection Time    08/16/13  5:40 AM      Result Value Range Status   MRSA by PCR NEGATIVE  NEGATIVE Final   Comment:            The GeneXpert MRSA Assay (FDA     approved for NASAL specimens     only), is one component of a     comprehensive MRSA colonization     surveillance program. It is not     intended to diagnose MRSA     infection nor to guide or     monitor treatment for     MRSA infections.  URINE CULTURE     Status: None   Collection Time    08/16/13  7:19 AM      Result Value Range Status   Specimen Description URINE, CLEAN  CATCH   Final   Special Requests NONE   Final   Culture  Setup Time     Final   Value: 08/16/2013 12:42     Performed at [facility] Count     Final   Value: NO GROWTH     Performed at [facility]   Culture     Final   Value: NO GROWTH     Performed at [facility]   Report Status 08/17/2013 FINAL   Final     Scheduled Meds: . [START ON 08/20/2013] cosyntropin  0.25 mg Intravenous Once  . docusate sodium  100 mg Oral BID  . folic acid  1 mg Oral Daily  . LORazepam  0-4 mg Oral Q4H  . multivitamin with minerals  1 tablet Oral Daily  . pantoprazole  40 mg Oral BID AC  . senna  2 tablet Oral Daily  . sodium chloride  3 mL Intravenous Q12H  . thiamine  100 mg Oral Daily   Continuous Infusions: . 0.9 % NaCl with KCl 20 mEq / L       Estel Scholze, DO  [facility] Pager [phone]  If 7PM-7AM, please contact night-coverage [email or web address] Password [email or web address] 08/19/2013, 8:12 AM   LOS: 4 days

## 2013-08-20 DIAGNOSIS — D696 Thrombocytopenia, unspecified: Secondary | ICD-10-CM

## 2013-08-20 LAB — BASIC METABOLIC PANEL
BUN: 9 mg/dL (ref 6–23)
Chloride: 106 mEq/L (ref 96–112)
Creatinine, Ser: 1.02 mg/dL (ref 0.50–1.35)
GFR calc Af Amer: >90 mL/min (ref >90)
Glucose, Bld: 108 mg/dL — ABNORMAL HIGH (ref 70–99)
Potassium: 3.7 mEq/L (ref 3.5–5.1)

## 2013-08-20 MED ORDER — LACTULOSE 10 GM/15ML PO SOLN
20.0000 g | Freq: Once | ORAL | Status: AC
  Administered 2013-08-20: 16:00:00 20 g via ORAL
  Filled 2013-08-20: qty 30

## 2013-08-20 MED ORDER — DSS 100 MG PO CAPS: 100.0000 mg | ORAL_CAPSULE | Freq: Two times a day (BID) | ORAL | Status: DC

## 2013-08-20 MED ORDER — SENNA 8.6 MG PO TABS: 2.0000 | ORAL_TABLET | Freq: Every day | ORAL | Status: DC

## 2013-08-20 MED ORDER — OXYCODONE HCL 5 MG PO TABS: 5.0000 mg | ORAL_TABLET | ORAL | Status: DC | PRN

## 2013-08-20 NOTE — Progress Notes (Signed)
I WENT INTO PT'S ROOM TO REMOVE HIS SL AND HE STATED THAT HE HAD ALREADY DONE SO. I EXAMINED THE SITE, AND IT LOOKED LIKE THE CATHETER HAD ALL BEEN REMOVED.  PT WAS IN A HURRY TO LEAVE AND WOULD NOT WAIT FOR ME TO D/C IT.

## 2013-08-20 NOTE — Progress Notes (Signed)
[facility] PROGRESS NOTE  Noah Medina:[id number] DOB: 23-Oct-1956 DOA: 08/15/2013 PCP: Iona Hansen, NP  Assessment/Plan: Falls with syncope with loss of bowel and bladder control.  -Suspect a combination of gait instability, inebriation, and deconditioning.  -Patient states that most of his episodes of dizziness and falls occur after alcohol use without any overt syncope unless he hits his head  -Orthostatic vital signs negative although his symptoms improved with fluid hydration  -CT brain negative for acute abnormalities. CT orbits negative for fracture  -Ammonia level 52--nonspecific--pt is lucid  - Telemetry--no ectopy, remained sinus  - ECHO--EF 60-65%, grade 1 diastolic dysfunction  - EEG--no epileptiform discharges  - Strongly counseled cessation from EtOH. Advised patient that if we are unable to find another cause of his syncope, that we will continue to try to help him, but until he quits alcohol, we may not be successful.  - TSH, RPR, B12, RBC folate--all unremarkable  - UA and UDS--negative  -MR brain--age advanced atrophy, small vessel disease, no acute abnormality  -Physical therapy was consulted; they recommended skilled nursing facility;  -Patient's options are limited due to insurance. He ultimately refused to go to the available facilities provided to him by social work.  -Patient subsequently changed his mind and wanted to go to a skilled nursing facility.  -Patient seen working with physical therapy with minimal to no dizziness, no chest discomfort -await results of cortosyn stimation test EtOH and tobacco use  -No signs of withdrawal at this time  - CIWA q2h for now  - Seizure precautions  - Folate, thiamine, MVI  Nausea and vomiting,  -resolved  -likely related to gastritis and possible PUD - continue PPI  - Tolerating diet at this time  Abdominal pain  -abdominal xray--unremarkable bowel gas pattern. No infiltrates or pulmonary edema -suspect  constipation  -start colace and senna  -add lactulose Tremor with nystagmus  -MR brain--negative for acute abnormalities  -Suspect a component of alcohol withdrawal  -On progressive days in hospital, nystagmus improved  -pt has hx of intention tremor--suspect essential tremor  Liver Cirrhosis  -Thrombocytopenia, elevated bilirubin, depressed albumin, and moderately elevated INR are likely secondary to cirrhosis.  -Bili was 1.6 at time of admission  -Platelets remained stable without any active signs of bleed  Leukopenia  - WBC remained stable throughout the hospitalization  - Smear unremarkable  -HIV neg  Mild transaminitis  -likely secondary to EtOH abuse  Deconditioning/gait instability  -PT evaluation has recommended skilled nursing facility.  -Serum B12--654, RBC folate of 715, RPR negative  Diet: Low salt  Access: PIV  Code Status: full  Family Communication: patient alone         Procedures/Studies: Dg Chest 2 View  08/13/2013   *RADIOLOGY REPORT*  Clinical Data: Syncope  CHEST - 2 VIEW  Comparison: 06/11/2013  Findings: The heart size and vascular pattern are normal.  The lungs are clear.  Surgical staples are seen in the gastroesophageal junction.  IMPRESSION: No acute abnormalities   Original Report Authenticated By: Esperanza Heir, M.D.   Ct Head Wo Contrast  08/15/2013   *RADIOLOGY REPORT*  Clinical Data:  Facial laceration  CT HEAD AND ORBITS WITHOUT CONTRAST  Technique:  Contiguous axial images were obtained from the base of the skull through the vertex without contrast. Multidetector CT imaging of the orbits was performed using the standard protocol without intravenous contrast.  Comparison:  CT head 08/13/2013  CT HEAD  Findings: Mild atrophy.  Negative for intracranial hemorrhage.  No acute infarct or mass.  Negative for skull fracture.  IMPRESSION: No acute abnormality.  CT ORBITS  Findings: Negative for facial fracture.  No fracture of the orbit. Nasal bone is  intact.  No air-fluid level.  Nasal septum is deviated to the right.  IMPRESSION: Negative for fracture of the orbit.   Original Report Authenticated By: Janeece Riggers, M.D.   Ct Head Wo Contrast  08/13/2013   *RADIOLOGY REPORT*  Clinical Data:  Syncope.  CT HEAD WITHOUT CONTRAST CT CERVICAL SPINE WITHOUT CONTRAST  Technique:  Multidetector CT imaging of the head and cervical spine was performed following the standard protocol without intravenous contrast.  Multiplanar CT image reconstructions of the cervical spine were also generated.  Comparison:  Head CT scan 04/23/2013.  CT HEAD  Findings: There is some cortical atrophy.  No evidence of acute intracranial abnormality including infarction, hemorrhage, mass lesion, mass effect, midline shift or abnormal extra-axial fluid collection is identified.  There is no hydrocephalus or pneumocephalus.  The calvarium is intact.  IMPRESSION: No acute finding.  CT CERVICAL SPINE  Findings: There is no fracture or subluxation of the cervical spine.  Loss of disc space height and endplate spurring appear worst at C6-7.  Paraspinous structures are unremarkable.  IMPRESSION: No acute finding.   Original Report Authenticated By: Holley Dexter, M.D.   Ct Cervical Spine Wo Contrast  08/13/2013   *RADIOLOGY REPORT*  Clinical Data:  Syncope.  CT HEAD WITHOUT CONTRAST CT CERVICAL SPINE WITHOUT CONTRAST  Technique:  Multidetector CT imaging of the head and cervical spine was performed following the standard protocol without intravenous contrast.  Multiplanar CT image reconstructions of the cervical spine were also generated.  Comparison:  Head CT scan 04/23/2013.  CT HEAD  Findings: There is some cortical atrophy.  No evidence of acute intracranial abnormality including infarction, hemorrhage, mass lesion, mass effect, midline shift or abnormal extra-axial fluid collection is identified.  There is no hydrocephalus or pneumocephalus.  The calvarium is intact.  IMPRESSION: No acute  finding.  CT CERVICAL SPINE  Findings: There is no fracture or subluxation of the cervical spine.  Loss of disc space height and endplate spurring appear worst at C6-7.  Paraspinous structures are unremarkable.  IMPRESSION: No acute finding.   Original Report Authenticated By: Holley Dexter, M.D.   Mr Brain Wo Contrast  08/17/2013   *RADIOLOGY REPORT*  Clinical Data: Falls.  Ataxia.  Tremor.  Possible seizure activity.  MRI HEAD WITHOUT CONTRAST  Technique:  Multiplanar, multiecho pulse sequences of the brain and surrounding structures were obtained according to standard protocol without intravenous contrast.  Comparison: CT head without contrast 08/15/2013.  Findings: No acute infarct, hemorrhage, or mass lesion is present. Minimal periventricular and subcortical T2 hyperintensity is patent bilaterally, slightly advanced for age.  Mild atrophy is also slightly advanced for age.  Flow is present in the major intracranial arteries.  The globes and orbits are intact.  The paranasal sinuses and left mastoid air cells are clear.  There is some fluid in the right mastoid air cells.  No obstructing nasopharyngeal lesion is evident.  Dedicated imaging of the temporal lobes demonstrates symmetric size and signal of the hippocampal structures.  IMPRESSION:  1.  Mild age advanced atrophy and white matter disease.  This could be related to chronic alcohol abuse.  Chronic microvascular ischemic changes could have a similar effect. 2.  No acute intracranial abnormality.   Original Report Authenticated By: Marin Roberts, M.D.   Dg Abd Acute  W/chest  08/19/2013   *RADIOLOGY REPORT*  Clinical Data: Abdominal pain.  ACUTE ABDOMEN SERIES (ABDOMEN 2 VIEW & CHEST 1 VIEW)  Comparison: Acute abdominal series 92,014.  Findings: Lung volumes are normal.  No consolidative airspace disease.  No pleural effusions.  No pneumothorax.  No pulmonary nodule or mass noted.  Pulmonary vasculature and the cardiomediastinal silhouette are  within normal limits.  Surgical clips near the gastroesophageal junction.  Calcified gallstones projecting over the right upper quadrant of the abdomen. Gas and stool are seen scattered throughout the colon extending to the level of the distal rectum.  No pathologic distension of small bowel is noted.  No gross evidence of pneumoperitoneum.  IMPRESSION: 1.  Nonobstructive bowel gas pattern. 2.  No pneumoperitoneum. 3.  Cholelithiasis. 4.  No radiographic evidence of acute cardiopulmonary disease.   Original Report Authenticated By: Trudie Reed, M.D.   Dg Abd Acute W/chest  08/15/2013   *RADIOLOGY REPORT*  Clinical Data: Fall.  Abdominal pain  ACUTE ABDOMEN SERIES (ABDOMEN 2 VIEW & CHEST 1 VIEW)  Comparison: 08/13/2013  Findings: COPD.  Lungs are clear.  Surgical clips in the GE junction.  Normal bowel gas pattern.  Negative for free air.  Right upper quadrant calcifications consistent with gallstones.  Mild scoliosis.  No acute bony abnormality.  IMPRESSION: COPD.  No active cardiopulmonary abnormality.  Gallstones  Normal bowel gas pattern.   Original Report Authenticated By: Janeece Riggers, M.D.   Ct Orbitss W/o Cm  08/15/2013   *RADIOLOGY REPORT*  Clinical Data:  Facial laceration  CT HEAD AND ORBITS WITHOUT CONTRAST  Technique:  Contiguous axial images were obtained from the base of the skull through the vertex without contrast. Multidetector CT imaging of the orbits was performed using the standard protocol without intravenous contrast.  Comparison:  CT head 08/13/2013  CT HEAD  Findings: Mild atrophy.  Negative for intracranial hemorrhage.  No acute infarct or mass.  Negative for skull fracture.  IMPRESSION: No acute abnormality.  CT ORBITS  Findings: Negative for facial fracture.  No fracture of the orbit. Nasal bone is intact.  No air-fluid level.  Nasal septum is deviated to the right.  IMPRESSION: Negative for fracture of the orbit.   Original Report Authenticated By: Janeece Riggers, M.D.          Subjective: Patient denies fevers, chills, chest discomfort, shortness of breath, nausea, vomiting, diarrhea. He had a bowel movement. Abdominal pain is better than yesterday. He is tolerating his diet.  Objective: Filed Vitals:   08/19/13 0653 08/19/13 1353 08/19/13 2122 08/20/13 0511  BP: 140/67 141/63 142/60 131/67  Pulse: 79 86 80 77  Temp: 98.6 F (37 C) 97.8 F (36.6 C) 98.4 F (36.9 C) 98.3 F (36.8 C)  TempSrc: Oral Oral Oral Oral  Resp: 16 16 18 18   Height:      Weight: 92.2 kg (203 lb 4.2 oz)   92.3 kg (203 lb 7.8 oz)  SpO2: 97% 100% 100% 98%    Intake/Output Summary (Last 24 hours) at 08/20/13 1415 Last data filed at 08/20/13 1100  Gross per 24 hour  Intake  857.5 ml  Output   2050 ml  Net -1192.5 ml   Weight change: 0.1 kg (3.5 oz) Exam:   General:  Pt is alert, follows commands appropriately, not in acute distress  HEENT: No icterus, No thrush,  [place]/AT  Cardiovascular: RRR, S1/S2, no rubs, no gallops  Respiratory: CTA bilaterally, no wheezing, no crackles, no rhonchi  Abdomen: Soft/+BS, non  tender, non distended, no guarding  Extremities: trace edema, No lymphangitis, No petechiae, No rashes, no synovitis  Data Reviewed: Basic Metabolic Panel:  Recent Labs Lab 08/15/13 2202 08/16/13 0259 08/17/13 0950 08/18/13 0403 08/20/13 0632  NA 142 140 135 138 138  K 4.5 3.8 3.8 3.9 3.7  CL 110 108 103 106 106  CO2 23 22 29 28 25   GLUCOSE 79 120* 123* 101* 108*  BUN 8 8 7 6 9   CREATININE 1.12 0.97 1.02 1.10 1.02  CALCIUM 8.3* 7.8* 8.5 8.3* 8.4   Liver Function Tests:  Recent Labs Lab 08/13/13 1835 08/15/13 2202 08/16/13 0259  AST 83* 88* 72*  ALT 34 34 30  ALKPHOS 85 85 75  BILITOT 1.5* 1.6* 1.4*  PROT 6.9 6.9 6.2  ALBUMIN 3.1* 2.9* 2.6*   No results found for this basename: LIPASE, AMYLASE,  in the last 168 hours  Recent Labs Lab 08/15/13 2202  AMMONIA 52   CBC:  Recent Labs Lab 08/13/13 1835 08/15/13 2202  08/16/13 0259 08/17/13 0950 08/18/13 0403  WBC 4.2 2.9* 2.4* 2.5* 2.2*  NEUTROABS 2.1 1.4*  --   --   --   HGB 12.4* 13.3 12.1* 12.5* 11.8*  HCT 35.8* 38.4* 35.7* 35.9* 34.6*  MCV 97.0 95.5 96.5 95.2 96.1  PLT 56* 46* 38* 33* 37*   Cardiac Enzymes:  Recent Labs Lab 08/13/13 1835  TROPONINI <0.30   BNP: No components found with this basename: POCBNP,  CBG: No results found for this basename: GLUCAP,  in the last 168 hours  Recent Results (from the past 240 hour(s))  MRSA PCR SCREENING     Status: None   Collection Time    08/16/13  5:40 AM      Result Value Range Status   MRSA by PCR NEGATIVE  NEGATIVE Final   Comment:            The GeneXpert MRSA Assay (FDA     approved for NASAL specimens     only), is one component of a     comprehensive MRSA colonization     surveillance program. It is not     intended to diagnose MRSA     infection nor to guide or     monitor treatment for     MRSA infections.  URINE CULTURE     Status: None   Collection Time    08/16/13  7:19 AM      Result Value Range Status   Specimen Description URINE, CLEAN CATCH   Final   Special Requests NONE   Final   Culture  Setup Time     Final   Value: 08/16/2013 12:42     Performed at [facility] Count     Final   Value: NO GROWTH     Performed at [facility]   Culture     Final   Value: NO GROWTH     Performed at [facility]   Report Status 08/17/2013 FINAL   Final     Scheduled Meds: . docusate sodium  100 mg Oral BID  . folic acid  1 mg Oral Daily  . multivitamin with minerals  1 tablet Oral Daily  . pantoprazole  40 mg Oral BID AC  . senna  2 tablet Oral Daily  . sodium chloride  3 mL Intravenous Q12H  . thiamine  100 mg Oral Daily   Continuous Infusions: . 0.9 % NaCl with KCl  20 mEq / L 75 mL/hr (08/20/13 0452)     Carola Viramontes, DO  [facility] Pager [phone]  If 7PM-7AM, please contact night-coverage [email or web address] Password  [email or web address] 08/20/2013, 2:15 PM   LOS: 5 days

## 2013-08-20 NOTE — Progress Notes (Signed)
Called by RN to inform the patient on to leave the hospital. Patient remains unsteady on his feet requiring significant assistance as documented by physical therapy. I spoke with the patient and his significant other at length. I told the patient that although he is medically stable he would benefit from a stay at a skilled nursing facility for physical rehabilitation. I told the patient that he is at significant risk for recurrent falls with possibility of head trauma. The patient has a history of head trauma from recurrent falls in the past. The patient expressed understanding of the risk and was adamant that he wanted to go home.  As the patient is medically stable at this time, home health with [facility] with physical therapy was previously set up for the patient. The patient's significant other he assures me that the patient will have 24-hour supervision at home. DTat

## 2013-08-20 NOTE — Discharge Summary (Signed)
Physician Discharge Summary  Noah Medina:[id number] DOB: 13-May-1956 DOA: 08/15/2013  PCP: Iona Hansen, NP  Admit date: 08/15/2013 Discharge date: 08/20/2013  Recommendations for Outpatient Follow-up:  1. Pt will need to follow up with PCP in 2 weeks post discharge 2. Please obtain BMP to evaluate electrolytes and kidney function 3. Please also check CBC to evaluate Hg and Hct levels 4. 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  -Suspect a combination of gait instability, inebriation, and deconditioning.  -Patient states that most of his episodes of dizziness and falls occur after alcohol use without any overt syncope unless he hits his head  -Orthostatic vital signs negative although his symptoms improved with fluid hydration  -CT brain negative for acute abnormalities. CT orbits negative for fracture  -Ammonia level 52--nonspecific--pt is lucid  - Telemetry--no ectopy, remained sinus  - ECHO--EF 60-65%, grade 1 diastolic dysfunction  - EEG--no epileptiform discharges  - Strongly counseled cessation from EtOH. Advised patient that if we are unable to find another cause of his syncope, that we will continue to try to help him, but until he quits alcohol, we may not be successful.  - TSH, RPR, B12, RBC folate--all unremarkable  - UA and UDS--negative  -MR brain--age advanced atrophy, small vessel disease, no acute abnormality  -Physical therapy was consulted; they recommended skilled nursing facility;  -Patient's  options are limited due to insurance. He ultimately refused to go to the available facilities provided to him by social work.  -Patient subsequently changed his mind and wanted to go to a skilled nursing facility.  -Patient seen working with physical therapy with minimal to no dizziness, no chest discomfort  -await results of cortosyn stimation test  -On 08/20/2013, the patient was adamant that he wanted to leave the hospital despite his gait instability. A conversation was carried out with him and his significant other present. He did not want to go to a skilled nursing facility and has changed his mind once again. [facility] health physical [facility]. Patient will be discharged as he is medically stable. EtOH and tobacco use  -No signs of withdrawal at this time  - CIWA q2h for now  - Seizure precautions  - Folate, thiamine, MVI  Nausea and vomiting,  -resolved  -likely related to gastritis and possible PUD - continue PPI  - Tolerating diet at this time  -Serum creatinine 1.02 on the day of discharge. Abdominal pain  -abdominal xray--unremarkable bowel gas pattern. No infiltrates or pulmonary edema  -suspect constipation  -start colace and senna  -add lactulose  Tremor with nystagmus  -MR brain--negative for acute abnormalities  -Suspect a component of alcohol withdrawal  -On progressive days in hospital, nystagmus improved  -pt has hx of intention tremor--suspect essential tremor  Liver Cirrhosis  -Thrombocytopenia, elevated bilirubin, depressed albumin, and moderately elevated INR are likely secondary to cirrhosis.  -Bili was 1.6 at time of admission  -Platelets remained stable without  any active signs of bleed  Leukopenia  - WBC remained stable throughout the hospitalization  - Smear unremarkable  -HIV neg  Mild transaminitis  -likely secondary to EtOH abuse  Deconditioning/gait instability  -PT evaluation has recommended  skilled nursing facility.  -Serum B12--654, RBC folate of 715, RPR negative    Discharge Condition: Stable  Disposition:  Follow-up Information   Follow up with [facility]. [facility] Therapy and Social Worker)    Contact information:   [address] [place] [address] [phone]       Diet: Low-sodium Wt Readings from Last 3 Encounters:  08/20/13 92.3 kg (203 lb 7.8 oz)  06/11/13 90.719 kg (200 lb)  04/24/13 90.2 kg (198 lb 13.7 oz)    History of present illness:  57 y.o. year-old male with history of HCV, EtOH cirrhosis, GERD, bleeding ulcers, subdural hematoma, incisional hernia s/p mesh placement with subsequent infections, who presents with recurrent syncope. The patient was last at their baseline health several months ago. He states he would rarely have syncope spells until the last month. Now, they are occuring about every other day. They normally occur when he attempts to stand from a seated position, but he denies flushing, nausea, darkening of vision or diminished hearing. He does not remember falling and instead wakes up on the floor. He has recently started losing control of his bowels and bladder during these episodes. He denies history of seizures or drug use, however, he admits to using 6-10 beers per day. He has not taken his home medications in a week because he ran out. He has had some nausea, vomiting, nonbilious and nonbloody, particularly after meals and poor appetite. Had some diarrhea1 week before admission which has since resolved.  In the ER, WBC 2.9 (lower than previous), hgb 13.3, plt 46 (stable), AST 88, ALT 34, bili 1.6 (also stable), albumin 2.9. INR from a few days ago was 1.43. EtOH 244. CXR: COPD. KUB: Gallstones and normal gas pattern. CT head and orbits: Mild atrophy but no acute fractures, hemorrhages, or changes. Troponin neg. ECG NSR. Patient is being admitted for syncope work up. Urine drug screen was negative.  Urinalysis did not suggest UTI. Upon further questioning, the patient states that he has had a history of recurrent falls without any actual syncope. He does relate some dizziness and orthostatic type symptoms with positional change from a seated to standing position. He states that he does not have any loss of consciousness after falls unless he has had his head. MRI of the brain was negative for any acute findings. EEG was negative for any epileptiform discharges. Carotid ultrasound was negative. TSH, HIV, RPR were unremarkable. His EKG was sinus rhythm without ST-T wave changes. The patient remained in sinus rhythm on telemetry without ectopy. His telemetry was ultimately discontinued. The patient remained pancytopenic throughout the hospitalization, but there were no active signs of bleeding. The patient's vomiting improved and he was able to tolerate a low sodium diet. The patient was fluid resuscitated. His dizziness did improve. He's continued to have some dizziness with positional changes and with physical therapy, but it was much improved. Initially, the patient was agreeable to go to a skilled nursing facility. He ultimately changed his mind and want to go home. Again 24 hours later, the patient changed his mind to go to a skilled nursing facility. The patient complained of abdominal pain throughout the hospitalization, but he stated that this was his normal pain associated with  his ventral hernia. Repeat acute abdominal series was negative for any obstruction or acute cardiopulmonary disease. Again, the patient was able to tolerate his diet without difficulty. The patient's morning cortisol was noted to be low at 1.2. Cortrosyn stimulation test was obtained , but the results are pending at this time.     Discharge Exam: Filed Vitals:   08/20/13 0511  BP: 131/67  Pulse: 77  Temp: 98.3 F (36.8 C)  Resp: 18   Filed Vitals:   08/19/13 0653 08/19/13 1353 08/19/13 2122 08/20/13 0511  BP:  140/67 141/63 142/60 131/67  Pulse: 79 86 80 77  Temp: 98.6 F (37 C) 97.8 F (36.6 C) 98.4 F (36.9 C) 98.3 F (36.8 C)  TempSrc: Oral Oral Oral Oral  Resp: 16 16 18 18   Height:      Weight: 92.2 kg (203 lb 4.2 oz)   92.3 kg (203 lb 7.8 oz)  SpO2: 97% 100% 100% 98%   General: A&O x 3, NAD, pleasant, cooperative Cardiovascular: RRR, no rub, no gallop, no S3 Respiratory: CTAB, no wheeze, no rhonchi Abdomen:soft, nontender, nondistended, positive bowel sounds Extremities: trace edema, No lymphangitis, no petechiae  Discharge Instructions  Discharge Orders   Future Appointments Provider Department Dept Phone   08/28/2013 10:40 AM Shelly Rubenstein, MD [facility], [place] [phone]   Future Orders Complete By Expires   Diet - low sodium heart healthy  As directed    Increase activity slowly  As directed        Medication List         folic acid 1 MG tablet  Commonly known as:  FOLVITE  Take 1 tablet (1 mg total) by mouth daily.     multivitamin with minerals Tabs tablet  Take 1 tablet by mouth daily.     omeprazole 20 MG tablet  Commonly known as:  PRILOSEC OTC  Take 20 mg by mouth daily.     oxyCODONE 5 MG immediate release tablet  Commonly known as:  Oxy IR/ROXICODONE  Take 1 tablet (5 mg total) by mouth every 4 (four) hours as needed for pain.     spironolactone 25 MG tablet  Commonly known as:  ALDACTONE  Take 25 mg by mouth daily.     thiamine 100 MG tablet  Take 1 tablet (100 mg total) by mouth daily.         The results of significant diagnostics from this hospitalization (including imaging, microbiology, ancillary and laboratory) are listed below for reference.    Significant Diagnostic Studies: Dg Chest 2 View  08/13/2013   *RADIOLOGY REPORT*  Clinical Data: Syncope  CHEST - 2 VIEW  Comparison: 06/11/2013  Findings: The heart size and vascular pattern are normal.  The lungs are clear.  Surgical staples are seen in the gastroesophageal  junction.  IMPRESSION: No acute abnormalities   Original Report Authenticated By: Esperanza Heir, M.D.   Ct Head Wo Contrast  08/15/2013   *RADIOLOGY REPORT*  Clinical Data:  Facial laceration  CT HEAD AND ORBITS WITHOUT CONTRAST  Technique:  Contiguous axial images were obtained from the base of the skull through the vertex without contrast. Multidetector CT imaging of the orbits was performed using the standard protocol without intravenous contrast.  Comparison:  CT head 08/13/2013  CT HEAD  Findings: Mild atrophy.  Negative for intracranial hemorrhage.  No acute infarct or mass.  Negative for skull fracture.  IMPRESSION: No acute abnormality.  CT ORBITS  Findings: Negative for facial fracture.  No fracture of the orbit. Nasal bone is intact.  No air-fluid level.  Nasal septum is deviated to the right.  IMPRESSION: Negative for fracture of the orbit.   Original Report Authenticated By: Janeece Riggers, M.D.   Ct Head Wo Contrast  08/13/2013   *RADIOLOGY REPORT*  Clinical Data:  Syncope.  CT HEAD WITHOUT CONTRAST CT CERVICAL SPINE WITHOUT CONTRAST  Technique:  Multidetector CT imaging of the head and cervical spine was performed following the standard protocol without intravenous contrast.  Multiplanar CT image reconstructions of the cervical spine were also generated.  Comparison:  Head CT scan 04/23/2013.  CT HEAD  Findings: There is some cortical atrophy.  No evidence of acute intracranial abnormality including infarction, hemorrhage, mass lesion, mass effect, midline shift or abnormal extra-axial fluid collection is identified.  There is no hydrocephalus or pneumocephalus.  The calvarium is intact.  IMPRESSION: No acute finding.  CT CERVICAL SPINE  Findings: There is no fracture or subluxation of the cervical spine.  Loss of disc space height and endplate spurring appear worst at C6-7.  Paraspinous structures are unremarkable.  IMPRESSION: No acute finding.   Original Report Authenticated By: Holley Dexter,  M.D.   Ct Cervical Spine Wo Contrast  08/13/2013   *RADIOLOGY REPORT*  Clinical Data:  Syncope.  CT HEAD WITHOUT CONTRAST CT CERVICAL SPINE WITHOUT CONTRAST  Technique:  Multidetector CT imaging of the head and cervical spine was performed following the standard protocol without intravenous contrast.  Multiplanar CT image reconstructions of the cervical spine were also generated.  Comparison:  Head CT scan 04/23/2013.  CT HEAD  Findings: There is some cortical atrophy.  No evidence of acute intracranial abnormality including infarction, hemorrhage, mass lesion, mass effect, midline shift or abnormal extra-axial fluid collection is identified.  There is no hydrocephalus or pneumocephalus.  The calvarium is intact.  IMPRESSION: No acute finding.  CT CERVICAL SPINE  Findings: There is no fracture or subluxation of the cervical spine.  Loss of disc space height and endplate spurring appear worst at C6-7.  Paraspinous structures are unremarkable.  IMPRESSION: No acute finding.   Original Report Authenticated By: Holley Dexter, M.D.   Mr Brain Wo Contrast  08/17/2013   *RADIOLOGY REPORT*  Clinical Data: Falls.  Ataxia.  Tremor.  Possible seizure activity.  MRI HEAD WITHOUT CONTRAST  Technique:  Multiplanar, multiecho pulse sequences of the brain and surrounding structures were obtained according to standard protocol without intravenous contrast.  Comparison: CT head without contrast 08/15/2013.  Findings: No acute infarct, hemorrhage, or mass lesion is present. Minimal periventricular and subcortical T2 hyperintensity is patent bilaterally, slightly advanced for age.  Mild atrophy is also slightly advanced for age.  Flow is present in the major intracranial arteries.  The globes and orbits are intact.  The paranasal sinuses and left mastoid air cells are clear.  There is some fluid in the right mastoid air cells.  No obstructing nasopharyngeal lesion is evident.  Dedicated imaging of the temporal lobes  demonstrates symmetric size and signal of the hippocampal structures.  IMPRESSION:  1.  Mild age advanced atrophy and white matter disease.  This could be related to chronic alcohol abuse.  Chronic microvascular ischemic changes could have a similar effect. 2.  No acute intracranial abnormality.   Original Report Authenticated By: Marin Roberts, M.D.   Dg Abd Acute W/chest  08/19/2013   *RADIOLOGY REPORT*  Clinical Data: Abdominal pain.  ACUTE ABDOMEN SERIES (ABDOMEN 2 VIEW & CHEST 1 VIEW)  Comparison:  Acute abdominal series 92,014.  Findings: Lung volumes are normal.  No consolidative airspace disease.  No pleural effusions.  No pneumothorax.  No pulmonary nodule or mass noted.  Pulmonary vasculature and the cardiomediastinal silhouette are within normal limits.  Surgical clips near the gastroesophageal junction.  Calcified gallstones projecting over the right upper quadrant of the abdomen. Gas and stool are seen scattered throughout the colon extending to the level of the distal rectum.  No pathologic distension of small bowel is noted.  No gross evidence of pneumoperitoneum.  IMPRESSION: 1.  Nonobstructive bowel gas pattern. 2.  No pneumoperitoneum. 3.  Cholelithiasis. 4.  No radiographic evidence of acute cardiopulmonary disease.   Original Report Authenticated By: Trudie Reed, M.D.   Dg Abd Acute W/chest  08/15/2013   *RADIOLOGY REPORT*  Clinical Data: Fall.  Abdominal pain  ACUTE ABDOMEN SERIES (ABDOMEN 2 VIEW & CHEST 1 VIEW)  Comparison: 08/13/2013  Findings: COPD.  Lungs are clear.  Surgical clips in the GE junction.  Normal bowel gas pattern.  Negative for free air.  Right upper quadrant calcifications consistent with gallstones.  Mild scoliosis.  No acute bony abnormality.  IMPRESSION: COPD.  No active cardiopulmonary abnormality.  Gallstones  Normal bowel gas pattern.   Original Report Authenticated By: Janeece Riggers, M.D.   Ct Orbitss W/o Cm  08/15/2013   *RADIOLOGY REPORT*  Clinical Data:   Facial laceration  CT HEAD AND ORBITS WITHOUT CONTRAST  Technique:  Contiguous axial images were obtained from the base of the skull through the vertex without contrast. Multidetector CT imaging of the orbits was performed using the standard protocol without intravenous contrast.  Comparison:  CT head 08/13/2013  CT HEAD  Findings: Mild atrophy.  Negative for intracranial hemorrhage.  No acute infarct or mass.  Negative for skull fracture.  IMPRESSION: No acute abnormality.  CT ORBITS  Findings: Negative for facial fracture.  No fracture of the orbit. Nasal bone is intact.  No air-fluid level.  Nasal septum is deviated to the right.  IMPRESSION: Negative for fracture of the orbit.   Original Report Authenticated By: Janeece Riggers, M.D.     Microbiology: Recent Results (from the past 240 hour(s))  MRSA PCR SCREENING     Status: None   Collection Time    08/16/13  5:40 AM      Result Value Range Status   MRSA by PCR NEGATIVE  NEGATIVE Final   Comment:            The GeneXpert MRSA Assay (FDA     approved for NASAL specimens     only), is one component of a     comprehensive MRSA colonization     surveillance program. It is not     intended to diagnose MRSA     infection nor to guide or     monitor treatment for     MRSA infections.  URINE CULTURE     Status: None   Collection Time    08/16/13  7:19 AM      Result Value Range Status   Specimen Description URINE, CLEAN CATCH   Final   Special Requests NONE   Final   Culture  Setup Time     Final   Value: 08/16/2013 12:42     Performed at [facility] Count     Final   Value: NO GROWTH     Performed at [facility]   Culture     Final  Value: NO GROWTH     Performed at [facility]   Report Status 08/17/2013 FINAL   Final     Labs: Basic Metabolic Panel:  Recent Labs Lab 08/15/13 2202 08/16/13 0259 08/17/13 0950 08/18/13 0403 08/20/13 0632  NA 142 140 135 138 138  K 4.5 3.8 3.8 3.9 3.7  CL  110 108 103 106 106  CO2 23 22 29 28 25   GLUCOSE 79 120* 123* 101* 108*  BUN 8 8 7 6 9   CREATININE 1.12 0.97 1.02 1.10 1.02  CALCIUM 8.3* 7.8* 8.5 8.3* 8.4   Liver Function Tests:  Recent Labs Lab 08/13/13 1835 08/15/13 2202 08/16/13 0259  AST 83* 88* 72*  ALT 34 34 30  ALKPHOS 85 85 75  BILITOT 1.5* 1.6* 1.4*  PROT 6.9 6.9 6.2  ALBUMIN 3.1* 2.9* 2.6*   No results found for this basename: LIPASE, AMYLASE,  in the last 168 hours  Recent Labs Lab 08/15/13 2202  AMMONIA 52   CBC:  Recent Labs Lab 08/13/13 1835 08/15/13 2202 08/16/13 0259 08/17/13 0950 08/18/13 0403  WBC 4.2 2.9* 2.4* 2.5* 2.2*  NEUTROABS 2.1 1.4*  --   --   --   HGB 12.4* 13.3 12.1* 12.5* 11.8*  HCT 35.8* 38.4* 35.7* 35.9* 34.6*  MCV 97.0 95.5 96.5 95.2 96.1  PLT 56* 46* 38* 33* 37*   Cardiac Enzymes:  Recent Labs Lab 08/13/13 1835  TROPONINI <0.30   BNP: No components found with this basename: POCBNP,  CBG: No results found for this basename: GLUCAP,  in the last 168 hours  Time coordinating discharge:  Greater than 30 minutes  Signed:  Tabria Steines, DO [facility] Pager: [phone] 08/20/2013, 2:48 PM

## 2013-08-20 NOTE — Progress Notes (Signed)
Notified [facility] of dc home today with HH PT and SW needed. Isidoro Donning RN CCM Case Mgmt phone [phone]

## 2013-08-20 NOTE — Progress Notes (Signed)
Occupational Therapy Treatment Noah Medina Details Name: Noah Medina MRN: [id number] DOB: 11/12/1956 Today's Date: 08/20/2013 Time: 4098-1191 OT Time Calculation (min): 14 min  OT Assessment / Plan / Recommendation  History of present illness Noah Medina is a 57 y.o. year-old male with history of HCV, EtOH cirrhosis, GERD, bleeding ulcers, subdural hematoma, incisional hernia s/p mesh placement with subsequent infections,  who presents with recurrent syncope.  The Noah Medina was last at their baseline health several months ago.  He states he would rarely have syncope spells until the last month.  Now, they are occuring about every other day.  They normally occur when he attempts to stand from a seated position, but he denies flushing, nausea, darkening of vision or diminished hearing.  He does not remember falling and instead wakes up on the floor.  He has recently started losing control of his bowels and bladder during these episodes.  He denies history of seizures or drug use, however, he admits to using 6-10 beers per day.  He has not taken his home medications in a week because he ran out   OT comments  Pt moving better and has less tremor but very sleepy this session.    Follow Up Recommendations  SNF    Barriers to Discharge       Equipment Recommendations  3 in 1 bedside comode    Recommendations for Other Services    Frequency Min 2X/week   Progress towards OT Goals Progress towards OT goals: Progressing toward goals  Plan Discharge plan remains appropriate    Precautions / Restrictions Precautions Precautions: Fall Restrictions Weight Bearing Restrictions: No   Pertinent Vitals/Pain No pain    ADL  Toileting - Clothing Manipulation and Hygiene: Minimal assistance Where Assessed - Toileting Clothing Manipulation and Hygiene: Sit to stand from 3-in-1 or toilet Equipment Used: Rolling walker Transfers/Ambulation Related to ADLs: Worked on sit to stand from recliner:  min A. ADL  Comments: Pt washed peri areas while standing as pad under him had urine spillage.  Pt very sleepy but arousable.  Tremor present, but decreased today.      OT Diagnosis:    OT Problem List:   OT Treatment Interventions:     OT Goals(current goals can now be found in the care plan section)    Visit Information  Last OT Received On: 08/20/13 Assistance Needed: +2 (plus one for sit to stand) History of Present Illness: Noah Medina is a 56 y.o. year-old male with history of HCV, EtOH cirrhosis, GERD, bleeding ulcers, subdural hematoma, incisional hernia s/p mesh placement with subsequent infections,  who presents with recurrent syncope.  The Noah Medina was last at their baseline health several months ago.  He states he would rarely have syncope spells until the last month.  Now, they are occuring about every other day.  They normally occur when he attempts to stand from a seated position, but he denies flushing, nausea, darkening of vision or diminished hearing.  He does not remember falling and instead wakes up on the floor.  He has recently started losing control of his bowels and bladder during these episodes.  He denies history of seizures or drug use, however, he admits to using 6-10 beers per day.  He has not taken his home medications in a week because he ran out    Subjective Data      Prior Functioning       Cognition  Cognition Arousal/Alertness: Lethargic (sleepy) Behavior During Therapy: WFL for tasks assessed/performed Overall Cognitive  Status: Within Functional Limits for tasks assessed    Mobility  Transfers Sit to Stand: 4: Min assist;With armrests;From chair/3-in-1 Details for Transfer Assistance: cues to scoot forward and for hand placement    Exercises      Balance Balance Balance Assessed: Yes Dynamic Standing Balance Dynamic Standing - Balance Support:  (R then LUE supported) Dynamic Standing - Level of Assistance: 4: Min assist Dynamic Standing - Balance Activities:   (toilet hygiene)   End of Session OT - End of Session Activity Tolerance: Noah Medina limited by fatigue Noah Medina left: in chair;with call bell/phone within reach;with chair alarm set  GO     Catori Panozzo 08/20/2013, 11:36 AM Marica Otter, OTR/L [phone] 08/20/2013

## 2013-08-20 NOTE — Progress Notes (Signed)
Physical Therapy Treatment Patient Details Name: Noah Medina MRN: [id number] DOB: Apr 30, 1956 Today's Date: 08/20/2013 Time: 1405-1430 PT Time Calculation (min): 25 min  PT Assessment / Plan / Recommendation  History of Present Illness patient is a 57 y.o. year-old male with history of HCV, EtOH cirrhosis, GERD, bleeding ulcers, subdural hematoma, incisional hernia s/p mesh placement with subsequent infections,  who presents with recurrent syncope.  The patient was last at their baseline health several months ago.  He states he would rarely have syncope spells until the last month.  Now, they are occuring about every other day.  They normally occur when he attempts to stand from a seated position, but he denies flushing, nausea, darkening of vision or diminished hearing.  He does not remember falling and instead wakes up on the floor.  He has recently started losing control of his bowels and bladder during these episodes.  He denies history of seizures or drug use, however, he admits to using 6-10 beers per day.  He has not taken his home medications in a week because he ran out   PT Comments   Pt states he feels better but is still having ABD pain off/on.  Assisted out of recliner and amb in hallway.  Tremors increased with fatigue/stress/anxiety.  Instructed pt to stop amb, stand erect and breath.  Tremors subsided. Amb pt 200 feet and he required x 5 standing rest breaks due to increased tremors.    Follow Up Recommendations  SNF;Home health PT     Does the patient have the potential to tolerate intense rehabilitation     Barriers to Discharge        Equipment Recommendations       Recommendations for Other Services    Frequency Min 3X/week   Progress towards PT Goals Progress towards PT goals: Progressing toward goals  Plan      Precautions / Restrictions Precautions Precautions: Fall Restrictions Weight Bearing Restrictions: No    Pertinent Vitals/Pain C/o ABD pain  "cramping"    Mobility  Bed Mobility Bed Mobility: Not assessed Details for Bed Mobility Assistance: Pt OOB in recliner Transfers Sit to Stand: 4: Min assist;With armrests;From chair/3-in-1 Details for Transfer Assistance: cues to scoot forward and for hand placement plus increased time Ambulation/Gait Ambulation/Gait Assistance: 4: Min assist Ambulation Distance (Feet): 200 Feet Assistive device: Rolling walker Ambulation/Gait Assistance Details: Tremors throughout which increase with fatigue/anxiety/stress.  Tremors decrease with ceasing activity.  Instructed pt to stop moving when ever he starts to shake.  Stand erect and deep breath.  Cont amb when the tremors stop.  Pt amb 200 feet and stopped 5 times due to tremors.   Gait Pattern: Step-to pattern Gait velocity: decreased     PT Goals (current goals can now be found in the care plan section)    Visit Information  Last PT Received On: 08/20/13 History of Present Illness: patient is a 57 y.o. year-old male with history of HCV, EtOH cirrhosis, GERD, bleeding ulcers, subdural hematoma, incisional hernia s/p mesh placement with subsequent infections,  who presents with recurrent syncope.  The patient was last at their baseline health several months ago.  He states he would rarely have syncope spells until the last month.  Now, they are occuring about every other day.  They normally occur when he attempts to stand from a seated position, but he denies flushing, nausea, darkening of vision or diminished hearing.  He does not remember falling and instead wakes up on the floor.  He has recently started losing control of his bowels and bladder during these episodes.  He denies history of seizures or drug use, however, he admits to using 6-10 beers per day.  He has not taken his home medications in a week because he ran out    Subjective Data      Cognition       Balance     End of Session PT - End of Session Equipment Utilized During  Treatment: Gait belt Activity Tolerance: Patient tolerated treatment well Patient left: in chair;with call bell/phone within reach Nurse Communication: Mobility status   Felecia Shelling  PTA [facility]  Acute  Rehab Pager      [phone]

## 2013-08-20 NOTE — Plan of Care (Signed)
Problem: Discharge Progression Outcomes Goal: Activity appropriate for discharge plan Outcome: Not Met (add Reason) PT LEFT AMA, EVEN THOUGH HE WAS PROPERLY DISCHARGED

## 2013-08-21 LAB — ACTH STIMULATION, 3 TIME POINTS
Cortisol, 30 Min: 15.3 ug/dL — ABNORMAL LOW (ref >20)
Cortisol, Base: 3.8 ug/dL

## 2013-08-25 ENCOUNTER — Encounter (HOSPITAL_COMMUNITY): Payer: Self-pay | Admitting: Emergency Medicine

## 2013-08-25 ENCOUNTER — Emergency Department (HOSPITAL_COMMUNITY): Payer: Medicaid Other

## 2013-08-25 ENCOUNTER — Emergency Department (HOSPITAL_COMMUNITY)
Admission: EM | Admit: 2013-08-25 | Discharge: 2013-08-25 | Disposition: A | Discharge status: Home / Self Care | Payer: Medicaid Other | Attending: Emergency Medicine | Admitting: Emergency Medicine

## 2013-08-25 DIAGNOSIS — R5381 Other malaise: Secondary | ICD-10-CM | POA: Insufficient documentation

## 2013-08-25 DIAGNOSIS — Z8679 Personal history of other diseases of the circulatory system: Secondary | ICD-10-CM | POA: Insufficient documentation

## 2013-08-25 DIAGNOSIS — Z8619 Personal history of other infectious and parasitic diseases: Secondary | ICD-10-CM | POA: Insufficient documentation

## 2013-08-25 DIAGNOSIS — K219 Gastro-esophageal reflux disease without esophagitis: Secondary | ICD-10-CM | POA: Insufficient documentation

## 2013-08-25 DIAGNOSIS — K7460 Unspecified cirrhosis of liver: Secondary | ICD-10-CM | POA: Insufficient documentation

## 2013-08-25 DIAGNOSIS — Z8711 Personal history of peptic ulcer disease: Secondary | ICD-10-CM | POA: Insufficient documentation

## 2013-08-25 DIAGNOSIS — G8929 Other chronic pain: Secondary | ICD-10-CM | POA: Insufficient documentation

## 2013-08-25 DIAGNOSIS — F17200 Nicotine dependence, unspecified, uncomplicated: Secondary | ICD-10-CM | POA: Insufficient documentation

## 2013-08-25 DIAGNOSIS — R110 Nausea: Secondary | ICD-10-CM | POA: Insufficient documentation

## 2013-08-25 DIAGNOSIS — R1084 Generalized abdominal pain: Secondary | ICD-10-CM | POA: Insufficient documentation

## 2013-08-25 DIAGNOSIS — R1031 Right lower quadrant pain: Secondary | ICD-10-CM | POA: Insufficient documentation

## 2013-08-25 LAB — CBC WITH DIFFERENTIAL/PLATELET
Basophils Absolute: 0 10*3/uL (ref 0.0–0.1)
Basophils Relative: 0 % (ref 0–1)
Eosinophils Absolute: 0.1 10*3/uL (ref 0.0–0.7)
Hemoglobin: 11.8 g/dL — ABNORMAL LOW (ref 13.0–17.0)
MCH: 32.5 pg (ref 26.0–34.0)
MCHC: 33.7 g/dL (ref 30.0–36.0)
Monocytes Relative: 13 % — ABNORMAL HIGH (ref 3–12)
Neutro Abs: 1.3 10*3/uL — ABNORMAL LOW (ref 1.7–7.7)
Neutrophils Relative %: 55 % (ref 43–77)
RDW: 14.6 % (ref 11.5–15.5)

## 2013-08-25 LAB — COMPREHENSIVE METABOLIC PANEL
AST: 53 U/L — ABNORMAL HIGH (ref 0–37)
BUN: 13 mg/dL (ref 6–23)
CO2: 22 mEq/L (ref 19–32)
Calcium: 8.9 mg/dL (ref 8.4–10.5)
Chloride: 110 mEq/L (ref 96–112)
Creatinine, Ser: 0.92 mg/dL (ref 0.50–1.35)
GFR calc Af Amer: >90 mL/min (ref >90)
GFR calc non Af Amer: >90 mL/min (ref >90)
Total Bilirubin: 1.5 mg/dL — ABNORMAL HIGH (ref 0.3–1.2)

## 2013-08-25 LAB — URINALYSIS, ROUTINE W REFLEX MICROSCOPIC
Bilirubin Urine: NEGATIVE
Ketones, ur: NEGATIVE mg/dL
Leukocytes, UA: NEGATIVE
Nitrite: NEGATIVE
Protein, ur: NEGATIVE mg/dL
Urobilinogen, UA: 2 mg/dL — ABNORMAL HIGH (ref 0.0–1.0)
pH: 7.5 (ref 5.0–8.0)

## 2013-08-25 LAB — PROTIME-INR
INR: 1.39 (ref 0.00–1.49)
Prothrombin Time: 16.7 seconds — ABNORMAL HIGH (ref 11.6–15.2)

## 2013-08-25 MED ORDER — TRAMADOL HCL 50 MG PO TABS: 50.0000 mg | ORAL_TABLET | Freq: Four times a day (QID) | ORAL | Status: DC | PRN

## 2013-08-25 MED ORDER — SODIUM CHLORIDE 0.9 % IV BOLUS (SEPSIS)
1000.0000 mL | Freq: Once | INTRAVENOUS | Status: AC
  Administered 2013-08-25: 1000 mL via INTRAVENOUS

## 2013-08-25 MED ORDER — ONDANSETRON HCL 4 MG/2ML IJ SOLN
4.0000 mg | Freq: Once | INTRAMUSCULAR | Status: AC
  Administered 2013-08-25: 4 mg via INTRAVENOUS
  Filled 2013-08-25: qty 2

## 2013-08-25 MED ORDER — ONDANSETRON HCL 4 MG PO TABS: 4.0000 mg | ORAL_TABLET | Freq: Four times a day (QID) | ORAL | Status: DC

## 2013-08-25 MED ORDER — MORPHINE SULFATE 4 MG/ML IJ SOLN
4.0000 mg | INTRAMUSCULAR | Status: DC | PRN
  Administered 2013-08-25 (×2): 4 mg via INTRAVENOUS
  Filled 2013-08-25 (×2): qty 1

## 2013-08-25 MED ORDER — IOHEXOL 300 MG/ML  SOLN
100.0000 mL | Freq: Once | INTRAMUSCULAR | Status: AC | PRN
  Administered 2013-08-25: 100 mL via INTRAVENOUS

## 2013-08-25 NOTE — Progress Notes (Signed)
CSW consulted by [facility] concerning referrals for assisted living facilities for pt.  CSW and [facility] spoke with pt who reports that he was in hospital a couple of weeks ago and that the doctor on the floor had tried to get him to go to assisted living facility when he was discharged but he was not ready then.  Pt reports that he realizes that he might need to do something because he keeps falling and hurting himself.  Pt reports that he already receives some home health services.  [name] will set pt up with additional services that include a social worker to assist patient with possible placement.  CSW gave pt a list of local facilities to review and discuss with home health social worker.  Pt thanked CSW and [facility] for concern and support.   Marva Panda, Theresia Majors  [phone] .08/25/2013 6:40 pm

## 2013-08-25 NOTE — ED Notes (Signed)
Noah Medina, EDP talked to care management about getting home health for pt and see if he may possible be eligible for placement in a facility

## 2013-08-25 NOTE — ED Notes (Signed)
Pt from home via EMS c/o RLQ pain. Pt reports that he fell x2 days ago and abd has been hurting since. Pt adds that he has inoperable hernia. Pt ambulatory, A&O and in NAD. Pt has HTN, but ran out of meds. Pt requesting placement in facility d/t being unable to care for himself.

## 2013-08-25 NOTE — Progress Notes (Signed)
08/25/2013 A. Bennie Dallas RNCM 1938pm [facility] faxed home health orders to [facility] on 09/12 at 1914pm, received confirmation of fax at 1918pm.

## 2013-08-25 NOTE — Progress Notes (Signed)
[name] spoke to patient at bedside.  Patient reports that he would like to be placed in a SNF or Rehab because, "I keep fallin' out."  Patient reports that he is already active with [facility]. [name] explained to patient that she will add a Child psychotherapist to help facilitate placement into SNF or rehab from homr. Patient reports that he has a walker at home, but he doesn't like using it.  [facility] encouraged patient to use his walker more.  Patient confirms his pcp is Zoe Lan NP under Dr. Everlene Other of [facility] [phone].  [name] also provided patient with list of private duty nursing agencies.  EDSW provided patient with list of SNFs.  Patient thankful for resources.  No further needs at this time.

## 2013-08-25 NOTE — ED Notes (Signed)
Bed: WA22 Expected date:  Expected time:  Means of arrival:  Comments: ems- abdominal pain 

## 2013-08-25 NOTE — ED Provider Notes (Signed)
CSN: [id number]     Arrival date & time 08/25/13  1352 History   First MD Initiated Contact with Patient 08/25/13 1500     Chief Complaint  Patient presents with  . Abdominal Pain    HPI  History of syncopal episodes. Recently admitted for a syncopal episode.. Thought to be multifactorial. History of cirrhosis, alcoholism, syncope, orthostasis.  He states that he was sitting in his garage wiggling seen several days ago. He stood up. He got lightheaded. He fell. He has had the did not have loss of consciousness. He has had right lower abdominal pain since that time. He also has chronic abdominal pain. He has not had bleeding. No blood in stools, no bleeding from his ears nose or mouth, some mild nausea. He does mention that he had discussed nursing home placement when he was last admitted. Per the notes he left AMA. Discussed with him that this is likely not something we would be able to accomplish in the emergency room unless he hit his complaints they require admission. He did assure him that we would be able to provide with multiple outpatient resources  Past Medical History  Diagnosis Date  . Cirrhosis of liver   . Hepatitis C 03-28-12    ? 80's-prev. IV drug abuse  . 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 Blood transfusion 03-28-12    '81-hx. of bleeding ulcer  . History of bleeding ulcers 03-28-12    Hx. '81  . GERD (gastroesophageal reflux disease) 03-26-12    Hx. reflux. ulcers in past, occ. OTC med  . Incisional hernia     has drainage system from abdomin  . Subdural hematoma    Past Surgical History  Procedure Laterality Date  . Umbilical hernia repair    . Hernia repair  08/24/11    ventral hernia with mesh  . Hernia repair  11'11    Umbilical  . Peptic ulcer  03-26-12    open peptic ulcer surgery repair- '81  . Incisional hernia repair  03/26/2012  . Incisional hernia repair  03/30/2012    Procedure: LAPAROSCOPIC INCISIONAL HERNIA;  Surgeon: Shelly Rubenstein, MD;  Location: [facility] ORS;  Service: General;  Laterality: N/A;  Laparoscopic Incisional/ventral Hernia Repair with Mesh  . Tonsillectomy    . Appendectomy    . Incisional hernia repair  07/20/2012    mesh  . Incisional hernia repair  07/20/2012    Procedure: HERNIA REPAIR INCISIONAL;  Surgeon: Shelly Rubenstein, MD;  Location: [facility] OR;  Service: General;  Laterality: N/A;  repair of incisional hernia with Biologic mesh   Family History  Problem Relation Age of Onset  . Cancer Mother     breast  . Cancer Father     brain  . Heart disease Brother   . Cancer Sister     breast  . Cancer Sister     breast   History  Substance Use Topics  . Smoking status: Current Every Day Smoker -- 0.25 packs/day for 38 years    Types: Cigarettes  . Smokeless tobacco: Never Used  . Alcohol Use: 21.0 oz/week    35 Cans of beer per week     Comment: Heavy- admits to 10 beers a day    Review of Systems  Constitutional: Positive for fatigue. Negative for fever, chills, diaphoresis and appetite change.  HENT: Negative for sore throat, mouth sores and trouble swallowing.   Eyes: Negative for visual disturbance.  Respiratory: Negative  for cough, chest tightness, shortness of breath and wheezing.   Cardiovascular: Negative for chest pain.  Gastrointestinal: Positive for nausea and abdominal pain. Negative for vomiting, diarrhea, blood in stool and abdominal distention.  Endocrine: Negative for polydipsia, polyphagia and polyuria.  Genitourinary: Negative for dysuria, frequency and hematuria.  Musculoskeletal: Negative for gait problem.  Skin: Negative for color change, pallor and rash.  Neurological: Negative for dizziness, syncope, light-headedness and headaches.  Hematological: Does not bruise/bleed easily.  Psychiatric/Behavioral: Negative for behavioral problems and confusion.    Allergies  Review of patient's allergies indicates no known allergies.  Home Medications   Current  Outpatient Rx  Name  Route  Sig  Dispense  Refill  . folic acid (FOLVITE) 1 MG tablet   Oral   Take 1 tablet (1 mg total) by mouth daily.   30 tablet   0   . Multiple Vitamin (MULITIVITAMIN WITH MINERALS) TABS   Oral   Take 1 tablet by mouth daily.         Marland Kitchen omeprazole (PRILOSEC OTC) 20 MG tablet   Oral   Take 20 mg by mouth daily as needed (heartburn).          Marland Kitchen oxyCODONE (OXY IR/ROXICODONE) 5 MG immediate release tablet   Oral   Take 1 tablet (5 mg total) by mouth every 4 (four) hours as needed for pain.   30 tablet   0   . spironolactone (ALDACTONE) 25 MG tablet   Oral   Take 25 mg by mouth daily.         Marland Kitchen thiamine 100 MG tablet   Oral   Take 1 tablet (100 mg total) by mouth daily.   30 tablet   0   . docusate sodium 100 MG CAPS   Oral   Take 100 mg by mouth 2 (two) times daily.   10 capsule   0   . ondansetron (ZOFRAN) 4 MG tablet   Oral   Take 1 tablet (4 mg total) by mouth every 6 (six) hours.   12 tablet   0   . traMADol (ULTRAM) 50 MG tablet   Oral   Take 1 tablet (50 mg total) by mouth every 6 (six) hours as needed for pain.   15 tablet   0    BP 146/69  Pulse 76  Temp(Src) 98.5 F (36.9 C) (Oral)  Resp 16  SpO2 97% Physical Exam  Constitutional: He is oriented to person, place, and time. He appears well-developed and well-nourished. No distress.  HENT:  Head: Normocephalic.  Eyes: Conjunctivae are normal. Pupils are equal, round, and reactive to light. No scleral icterus.  Conjunctiva not pale. Sclera sclera not icteric.  Neck: Normal range of motion. Neck supple. No thyromegaly present.  Cardiovascular: Normal rate and regular rhythm.  Exam reveals no gallop and no friction rub.   No murmur heard. Is not tachycardic, hypertensive.  Pulmonary/Chest: Effort normal and breath sounds normal. No respiratory distress. He has no wheezes. He has no rales.  Abdominal: Soft. Bowel sounds are normal. He exhibits no distension. There is no  tenderness. There is no rebound.  And large midabdominal hernia. He wears abdominal binder for this. Has a firm mass in the right lower abdomen. He states that this is "scar tissue" he states it is been there for years. He has a right mid abdominal tenderness. Is in the area of the inferior tip of the right lobe of the liver. He has no peritoneal irritation  Musculoskeletal: Normal range of motion.  Neurological: He is alert and oriented to person, place, and time.  Skin: Skin is warm and dry. No rash noted.  Psychiatric: He has a normal mood and affect. His behavior is normal.    ED Course  Procedures (including critical care time) Labs Review Labs Reviewed  COMPREHENSIVE METABOLIC PANEL - Abnormal; Notable for the following:    Albumin 2.7 (*)    AST 53 (*)    Total Bilirubin 1.5 (*)    All other components within normal limits  PROTIME-INR - Abnormal; Notable for the following:    Prothrombin Time 16.7 (*)    All other components within normal limits  URINALYSIS, ROUTINE W REFLEX MICROSCOPIC - Abnormal; Notable for the following:    APPearance TURBID (*)    Urobilinogen, UA 2.0 (*)    All other components within normal limits  CBC WITH DIFFERENTIAL - Abnormal; Notable for the following:    WBC 2.4 (*)    RBC 3.63 (*)    Hemoglobin 11.8 (*)    HCT 35.0 (*)    Platelets 46 (*)    Neutro Abs 1.3 (*)    Monocytes Relative 13 (*)    All other components within normal limits  URINE MICROSCOPIC-ADD ON  CBC WITH DIFFERENTIAL   Imaging Review Ct Abdomen Pelvis W Contrast  08/25/2013   CLINICAL DATA:  Fall.  EXAM: CT ABDOMEN AND PELVIS WITH CONTRAST  TECHNIQUE: Multidetector CT imaging of the abdomen and pelvis was performed using the standard protocol following bolus administration of intravenous contrast.  CONTRAST:  OMNIPAQUE IOHEXOL 300 MG/ML  SOLN  COMPARISON:  06/11/2013  FINDINGS: Linear areas of scarring or atelectasis in the lung bases. Surgical clips around the GE  junction and distal esophagus. Heart is normal size. No effusions.  Changes of cirrhosis with small nodular liver. Associated splenomegaly. No ascites. 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 These are unchanged. No evidence of solid organ injury. Pancreas, adrenals and kidneys are unremarkable. Aorta and iliac vessels are calcified. Urinary bladder is unremarkable.  Small gallstones are again noted within the gallbladder. Two midline ventral hernias containing fat, stable. More inferiorly, there is an area of outward bulging of the abdominal wall containing multiple small bowel loops and portions of the transverse colon. No evidence of bowel obstruction. No change.  No acute bony abnormality.  IMPRESSION: No acute findings in the abdomen or pelvis.  Changes of cirrhosis with associated splenomegaly and Large right abdominal varices.  Cholelithiasis.   Electronically Signed   By: Charlett Nose M.D.   On: 08/25/2013 17:04    MDM   1. Cirrhosis   2. Abdominal pain, chronic, generalized    Of his history of cirrhosis minutes: Abdominal pain image his abdomen for any acute injuries. He'll be given IV pain meds IV antiemetics and fluids. Who will evaluate his kidney function for any changes, his hemoglobin for any changes.  18:18:  His pain is improved. His abdomen remains benign. His evaluation here reveals stable hemoglobin, his urine does not appear infected, no abnormalities of electrolytes or renal function, and a nontraumatic abdominal CT.  She was working a Futures trader speak with him regarding his options for evaluation for consideration for a nursing home care. As I speak with him now he is awake and alert he is mandatory back and forth to the bathroom. He is not orthostatic. I think he is perfectly appropriate for  outpatient treatment. Fluid hydration, primary care physician followup.     Claudean Kinds, MD 08/25/13 1820

## 2013-08-25 NOTE — ED Notes (Signed)
Pt reports that he has been falling since January of this year. Pt states that when he fell, he hit his head but no LOC. Pt here today for abd after fall. Pt states that at his last visit, he was advised of being placed in nursing facilty. Pt wants to see about placement this trip. Pt is A&O and in NAD. Pt wearing binder around abd for hernia compression.

## 2013-08-28 ENCOUNTER — Ambulatory Visit (INDEPENDENT_AMBULATORY_CARE_PROVIDER_SITE_OTHER): Payer: Medicaid Other | Admitting: Surgery

## 2013-09-16 ENCOUNTER — Emergency Department (HOSPITAL_COMMUNITY): Payer: Medicaid Other

## 2013-09-16 ENCOUNTER — Encounter (HOSPITAL_COMMUNITY): Payer: Self-pay | Admitting: Emergency Medicine

## 2013-09-16 ENCOUNTER — Emergency Department (HOSPITAL_COMMUNITY)
Admission: EM | Admit: 2013-09-16 | Discharge: 2013-09-16 | Disposition: A | Discharge status: Home / Self Care | Payer: Medicaid Other | Attending: Emergency Medicine | Admitting: Emergency Medicine

## 2013-09-16 DIAGNOSIS — F17200 Nicotine dependence, unspecified, uncomplicated: Secondary | ICD-10-CM | POA: Insufficient documentation

## 2013-09-16 DIAGNOSIS — K219 Gastro-esophageal reflux disease without esophagitis: Secondary | ICD-10-CM | POA: Insufficient documentation

## 2013-09-16 DIAGNOSIS — R55 Syncope and collapse: Secondary | ICD-10-CM

## 2013-09-16 DIAGNOSIS — Z8619 Personal history of other infectious and parasitic diseases: Secondary | ICD-10-CM | POA: Insufficient documentation

## 2013-09-16 DIAGNOSIS — R350 Frequency of micturition: Secondary | ICD-10-CM | POA: Insufficient documentation

## 2013-09-16 DIAGNOSIS — Z79899 Other long term (current) drug therapy: Secondary | ICD-10-CM | POA: Insufficient documentation

## 2013-09-16 LAB — CBC
MCH: 32.8 pg (ref 26.0–34.0)
MCHC: 35.2 g/dL (ref 30.0–36.0)
Platelets: 46 10*3/uL — ABNORMAL LOW (ref 150–400)
RBC: 3.87 MIL/uL — ABNORMAL LOW (ref 4.22–5.81)

## 2013-09-16 LAB — BASIC METABOLIC PANEL
CO2: 25 mEq/L (ref 19–32)
Calcium: 8.9 mg/dL (ref 8.4–10.5)
GFR calc non Af Amer: 79 mL/min — ABNORMAL LOW (ref >90)
Sodium: 140 mEq/L (ref 135–145)

## 2013-09-16 LAB — ETHANOL: Alcohol, Ethyl (B): 183 mg/dL — ABNORMAL HIGH (ref 0–11)

## 2013-09-16 LAB — URINE MICROSCOPIC-ADD ON

## 2013-09-16 LAB — GLUCOSE, CAPILLARY: Glucose-Capillary: 108 mg/dL — ABNORMAL HIGH (ref 70–99)

## 2013-09-16 LAB — URINALYSIS, ROUTINE W REFLEX MICROSCOPIC
Leukocytes, UA: NEGATIVE
Protein, ur: NEGATIVE mg/dL
Urobilinogen, UA: 4 mg/dL — ABNORMAL HIGH (ref 0.0–1.0)

## 2013-09-16 NOTE — ED Notes (Signed)
Pt refused to sit in [facility] to be moved to room 5, pt insist on walking

## 2013-09-16 NOTE — ED Provider Notes (Signed)
CSN: [id number]     Arrival date & time 09/16/13  1916 History   First MD Initiated Contact with Patient 09/16/13 1950     Chief Complaint  Patient presents with  . Loss of Consciousness   (Consider location/radiation/quality/duration/timing/severity/associated sxs/prior Treatment) HPI Comments: 57 year old male presents with 3 episodes of passing out. He states all 3 occurred yesterday. He states each time he was sitting on the couch watching TV and then he had a sudden onset of syncope. He denies any injuries. He states that he is unsure how he is out of known witnessed these. He has had syncope in the past has been told is from vertigo. However this time he denies any headaches or dizziness. He does endorse some urinary incontinence during these episodes. Does not feel muscle pain or having any tongue injuries. He denies any headache, blurry vision, dizziness, lightheadedness, chest pain, shortness of breath, or abdominal pain. He has had increased urinary frequency over the past 3 weeks. At this point he feels normal but wanted that checked out so he came in to be evaluated  The history is provided by the patient.    Past Medical History  Diagnosis Date  . Cirrhosis of liver   . Hepatitis C 03-28-12    ? 80's-prev. IV drug abuse  . 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 Blood transfusion 03-28-12    '81-hx. of bleeding ulcer  . History of bleeding ulcers 03-28-12    Hx. '81  . GERD (gastroesophageal reflux disease) 03-26-12    Hx. reflux. ulcers in past, occ. OTC med  . Incisional hernia     has drainage system from abdomin  . Subdural hematoma    Past Surgical History  Procedure Laterality Date  . Umbilical hernia repair    . Hernia repair  08/24/11    ventral hernia with mesh  . Hernia repair  11'11    Umbilical  . Peptic ulcer  03-26-12    open peptic ulcer surgery repair- '81  . Incisional hernia repair  03/26/2012  . Incisional hernia repair   03/30/2012    Procedure: LAPAROSCOPIC INCISIONAL HERNIA;  Surgeon: Shelly Rubenstein, MD;  Location: [facility];  Service: General;  Laterality: N/A;  Laparoscopic Incisional/ventral Hernia Repair with Mesh  . Tonsillectomy    . Appendectomy    . Incisional hernia repair  07/20/2012    mesh  . Incisional hernia repair  07/20/2012    Procedure: HERNIA REPAIR INCISIONAL;  Surgeon: Shelly Rubenstein, MD;  Location: [facility];  Service: General;  Laterality: N/A;  repair of incisional hernia with Biologic mesh   Family History  Problem Relation Age of Onset  . Cancer Mother     breast  . Cancer Father     brain  . Heart disease Brother   . Cancer Sister     breast  . Cancer Sister     breast   History  Substance Use Topics  . Smoking status: Current Every Day Smoker -- 0.25 packs/day for 38 years    Types: Cigarettes  . Smokeless tobacco: Never Used  . Alcohol Use: 21.0 oz/week    35 Cans of beer per week     Comment: 6 beers a week per pt    Review of Systems  Constitutional: Negative for fever and chills.  Eyes: Negative for visual disturbance.  Respiratory: Negative for cough and shortness of breath.   Cardiovascular: Negative for chest pain.  Gastrointestinal: Negative for  nausea, vomiting, abdominal pain, diarrhea and abdominal distention.  Genitourinary: Positive for frequency. Negative for dysuria.  Musculoskeletal: Negative for back pain.  Neurological: Positive for syncope. Negative for dizziness, weakness, light-headedness and headaches.  All other systems reviewed and are negative.    Allergies  Review of patient's allergies indicates no known allergies.  Home Medications   Current Outpatient Rx  Name  Route  Sig  Dispense  Refill  . docusate sodium 100 MG CAPS   Oral   Take 100 mg by mouth 2 (two) times daily.   10 capsule   0   . folic acid (FOLVITE) 1 MG tablet   Oral   Take 1 tablet (1 mg total) by mouth daily.   30 tablet   0   . Multiple Vitamin  (MULITIVITAMIN WITH MINERALS) TABS   Oral   Take 1 tablet by mouth daily.         Marland Kitchen omeprazole (PRILOSEC OTC) 20 MG tablet   Oral   Take 20 mg by mouth daily as needed (heartburn).          . ondansetron (ZOFRAN) 4 MG tablet   Oral   Take 1 tablet (4 mg total) by mouth every 6 (six) hours.   12 tablet   0   . oxyCODONE (OXY IR/ROXICODONE) 5 MG immediate release tablet   Oral   Take 1 tablet (5 mg total) by mouth every 4 (four) hours as needed for pain.   30 tablet   0   . spironolactone (ALDACTONE) 25 MG tablet   Oral   Take 25 mg by mouth daily.         Marland Kitchen thiamine 100 MG tablet   Oral   Take 1 tablet (100 mg total) by mouth daily.   30 tablet   0   . traMADol (ULTRAM) 50 MG tablet   Oral   Take 1 tablet (50 mg total) by mouth every 6 (six) hours as needed for pain.   15 tablet   0    BP 136/73  Pulse 87  Temp(Src) 99.2 F (37.3 C) (Oral)  Resp 13  Ht 5\' 11"  (1.803 m)  Wt 200 lb (90.719 kg)  BMI 27.91 kg/m2  SpO2 100% Physical Exam  Nursing note and vitals reviewed. Constitutional: He is oriented to person, place, and time. He appears well-developed and well-nourished.  HENT:  Head: Normocephalic and atraumatic.  Right Ear: External ear normal.  Left Ear: External ear normal.  Nose: Nose normal.  Eyes: EOM are normal. Pupils are equal, round, and reactive to light. Right eye exhibits no discharge. Left eye exhibits no discharge.  Neck: Neck supple.  Cardiovascular: Normal rate, regular rhythm, normal heart sounds and intact distal pulses.   Pulmonary/Chest: Effort normal and breath sounds normal.  Abdominal: Soft. There is no tenderness.  Musculoskeletal: He exhibits no edema.  Neurological: He is alert and oriented to person, place, and time. He has normal strength. He displays no tremor. No cranial nerve deficit or sensory deficit. He exhibits normal muscle tone. Coordination and gait normal. GCS eye subscore is 4. GCS verbal subscore is 5. GCS  motor subscore is 6.  Skin: Skin is warm and dry.    ED Course  Procedures (including critical care time) Labs Review Labs Reviewed  CBC - Abnormal; Notable for the following:    WBC 3.9 (*)    RBC 3.87 (*)    Hemoglobin 12.7 (*)    HCT 36.1 (*)  Platelets 46 (*)    All other components within normal limits  BASIC METABOLIC PANEL - Abnormal; Notable for the following:    GFR calc non Af Amer 79 (*)    All other components within normal limits  URINALYSIS, ROUTINE W REFLEX MICROSCOPIC - Abnormal; Notable for the following:    Hgb urine dipstick TRACE (*)    Urobilinogen, UA 4.0 (*)    All other components within normal limits  ETHANOL - Abnormal; Notable for the following:    Alcohol, Ethyl (B) 183 (*)    All other components within normal limits  GLUCOSE, CAPILLARY - Abnormal; Notable for the following:    Glucose-Capillary 108 (*)    All other components within normal limits  URINE RAPID DRUG SCREEN (HOSP PERFORMED)  URINE MICROSCOPIC-ADD ON    Date: 09/16/2013  Rate: 89  Rhythm: normal sinus rhythm  QRS Axis: normal  Intervals: normal  ST/T Wave abnormalities: nonspecific T wave changes  Conduction Disutrbances:none  Narrative Interpretation:   Old EKG Reviewed: unchanged   Imaging Review Ct Head Wo Contrast  09/16/2013   CLINICAL DATA:  Syncopal episodes  EXAM: CT HEAD WITHOUT CONTRAST  TECHNIQUE: Contiguous axial images were obtained from the base of the skull through the vertex without intravenous contrast.  COMPARISON:  09/09/2013  FINDINGS: The bony calvarium is intact. No acute hemorrhage, acute infarction or space-occupying mass lesion is noted. No soft tissue changes are seen.  IMPRESSION: No acute abnormality noted.   Electronically Signed   By: Alcide Clever M.D.   On: 09/16/2013 20:43    MDM   1. Syncope    Patient is well-appearing here. At this point with no witnesses to his episodes of syncope versus seizure. He states that he has not had beer the  last few days. He has had 4 beers today. His workup is benign. His orthostatics are negative, his EKG is benign, and his CT head is normal. He's had no events while being here. His telemetry has been normal. I favor with his incontinence these are more likely to be seizures. Possibly from alcohol withdrawal but he has been drinking alcohol recently. We'll have him followup with his PCP in 2 days as planned as well as give him outpatient neurology followup. Her no acute emergent conditions found currently. Specifically discussed that while he is being worked up for seizures she should not drive, swim, take baths, or climb ladders.    Audree Camel, MD 09/16/13 [phone]

## 2013-09-16 NOTE — ED Notes (Signed)
Pt out of room for CT scan, unable to do blood draw at this time.

## 2013-09-16 NOTE — ED Notes (Signed)
Pt states he experienced 3 syncopal episodes yesterday, pt denies pain. Pt states he did not want to seek medical care yesterday. Pt A & O. NAD.

## 2013-10-18 ENCOUNTER — Ambulatory Visit: Payer: Medicaid Other | Admitting: Neurology

## 2013-10-19 ENCOUNTER — Other Ambulatory Visit: Payer: Self-pay

## 2013-11-17 ENCOUNTER — Inpatient Hospital Stay (HOSPITAL_COMMUNITY)
Admission: EM | Admit: 2013-11-17 | Discharge: 2013-11-22 | DRG: 432 | Disposition: A | Discharge status: Home / Self Care | Payer: Medicaid Other | Attending: Internal Medicine | Admitting: Internal Medicine

## 2013-11-17 DIAGNOSIS — Z79899 Other long term (current) drug therapy: Secondary | ICD-10-CM

## 2013-11-17 DIAGNOSIS — R601 Generalized edema: Secondary | ICD-10-CM | POA: Diagnosis present

## 2013-11-17 DIAGNOSIS — K7290 Hepatic failure, unspecified without coma: Secondary | ICD-10-CM | POA: Diagnosis present

## 2013-11-17 DIAGNOSIS — F1020 Alcohol dependence, uncomplicated: Secondary | ICD-10-CM | POA: Diagnosis present

## 2013-11-17 DIAGNOSIS — R188 Other ascites: Secondary | ICD-10-CM | POA: Diagnosis present

## 2013-11-17 DIAGNOSIS — B1921 Unspecified viral hepatitis C with hepatic coma: Secondary | ICD-10-CM | POA: Diagnosis present

## 2013-11-17 DIAGNOSIS — R609 Edema, unspecified: Secondary | ICD-10-CM | POA: Diagnosis present

## 2013-11-17 DIAGNOSIS — F329 Major depressive disorder, single episode, unspecified: Secondary | ICD-10-CM | POA: Diagnosis present

## 2013-11-17 DIAGNOSIS — R45851 Suicidal ideations: Secondary | ICD-10-CM

## 2013-11-17 DIAGNOSIS — R45850 Homicidal ideations: Secondary | ICD-10-CM

## 2013-11-17 DIAGNOSIS — F10929 Alcohol use, unspecified with intoxication, unspecified: Secondary | ICD-10-CM | POA: Diagnosis present

## 2013-11-17 DIAGNOSIS — F17200 Nicotine dependence, unspecified, uncomplicated: Secondary | ICD-10-CM | POA: Diagnosis present

## 2013-11-17 DIAGNOSIS — Z808 Family history of malignant neoplasm of other organs or systems: Secondary | ICD-10-CM

## 2013-11-17 DIAGNOSIS — F332 Major depressive disorder, recurrent severe without psychotic features: Secondary | ICD-10-CM | POA: Diagnosis present

## 2013-11-17 DIAGNOSIS — K439 Ventral hernia without obstruction or gangrene: Secondary | ICD-10-CM | POA: Diagnosis present

## 2013-11-17 DIAGNOSIS — D696 Thrombocytopenia, unspecified: Secondary | ICD-10-CM

## 2013-11-17 DIAGNOSIS — Z23 Encounter for immunization: Secondary | ICD-10-CM

## 2013-11-17 DIAGNOSIS — F1027 Alcohol dependence with alcohol-induced persisting dementia: Secondary | ICD-10-CM | POA: Diagnosis present

## 2013-11-17 DIAGNOSIS — K7682 Hepatic encephalopathy: Secondary | ICD-10-CM | POA: Diagnosis present

## 2013-11-17 DIAGNOSIS — D649 Anemia, unspecified: Secondary | ICD-10-CM

## 2013-11-17 DIAGNOSIS — K219 Gastro-esophageal reflux disease without esophagitis: Secondary | ICD-10-CM | POA: Diagnosis present

## 2013-11-17 DIAGNOSIS — K7460 Unspecified cirrhosis of liver: Secondary | ICD-10-CM | POA: Diagnosis present

## 2013-11-17 DIAGNOSIS — K7030 Alcoholic cirrhosis of liver without ascites: Principal | ICD-10-CM | POA: Diagnosis present

## 2013-11-17 DIAGNOSIS — Z803 Family history of malignant neoplasm of breast: Secondary | ICD-10-CM

## 2013-11-17 DIAGNOSIS — F3289 Other specified depressive episodes: Secondary | ICD-10-CM | POA: Diagnosis present

## 2013-11-17 DIAGNOSIS — Z8711 Personal history of peptic ulcer disease: Secondary | ICD-10-CM

## 2013-11-17 DIAGNOSIS — Z8249 Family history of ischemic heart disease and other diseases of the circulatory system: Secondary | ICD-10-CM

## 2013-11-17 DIAGNOSIS — F1010 Alcohol abuse, uncomplicated: Secondary | ICD-10-CM | POA: Diagnosis present

## 2013-11-18 ENCOUNTER — Emergency Department (HOSPITAL_COMMUNITY): Payer: Medicaid Other

## 2013-11-18 ENCOUNTER — Encounter (HOSPITAL_COMMUNITY): Payer: Self-pay | Admitting: Emergency Medicine

## 2013-11-18 DIAGNOSIS — F1010 Alcohol abuse, uncomplicated: Secondary | ICD-10-CM

## 2013-11-18 DIAGNOSIS — D649 Anemia, unspecified: Secondary | ICD-10-CM

## 2013-11-18 DIAGNOSIS — K219 Gastro-esophageal reflux disease without esophagitis: Secondary | ICD-10-CM

## 2013-11-18 DIAGNOSIS — R609 Edema, unspecified: Secondary | ICD-10-CM

## 2013-11-18 DIAGNOSIS — R601 Generalized edema: Secondary | ICD-10-CM | POA: Diagnosis present

## 2013-11-18 DIAGNOSIS — F10929 Alcohol use, unspecified with intoxication, unspecified: Secondary | ICD-10-CM | POA: Diagnosis present

## 2013-11-18 DIAGNOSIS — K7290 Hepatic failure, unspecified without coma: Secondary | ICD-10-CM

## 2013-11-18 DIAGNOSIS — K439 Ventral hernia without obstruction or gangrene: Secondary | ICD-10-CM | POA: Diagnosis present

## 2013-11-18 DIAGNOSIS — F10229 Alcohol dependence with intoxication, unspecified: Secondary | ICD-10-CM

## 2013-11-18 DIAGNOSIS — F17200 Nicotine dependence, unspecified, uncomplicated: Secondary | ICD-10-CM | POA: Diagnosis present

## 2013-11-18 DIAGNOSIS — F10239 Alcohol dependence with withdrawal, unspecified: Secondary | ICD-10-CM

## 2013-11-18 DIAGNOSIS — F1027 Alcohol dependence with alcohol-induced persisting dementia: Secondary | ICD-10-CM

## 2013-11-18 LAB — URINALYSIS, ROUTINE W REFLEX MICROSCOPIC
Ketones, ur: NEGATIVE mg/dL
Leukocytes, UA: NEGATIVE
Nitrite: NEGATIVE
Protein, ur: NEGATIVE mg/dL
Urobilinogen, UA: 2 mg/dL — ABNORMAL HIGH (ref 0.0–1.0)
pH: 6.5 (ref 5.0–8.0)

## 2013-11-18 LAB — CBC
HCT: 32.6 % — ABNORMAL LOW (ref 39.0–52.0)
Hemoglobin: 12 g/dL — ABNORMAL LOW (ref 13.0–17.0)
MCH: 34.2 pg — ABNORMAL HIGH (ref 26.0–34.0)
MCHC: 35.9 g/dL (ref 30.0–36.0)
Platelets: 38 10*3/uL — ABNORMAL LOW (ref 150–400)
RBC: 3.56 MIL/uL — ABNORMAL LOW (ref 4.22–5.81)
RDW: 14.7 % (ref 11.5–15.5)

## 2013-11-18 LAB — BASIC METABOLIC PANEL
BUN: 10 mg/dL (ref 6–23)
Chloride: 110 mEq/L (ref 96–112)
Creatinine, Ser: 0.96 mg/dL (ref 0.50–1.35)
GFR calc Af Amer: >90 mL/min (ref >90)
GFR calc non Af Amer: 90 mL/min — ABNORMAL LOW (ref >90)
Glucose, Bld: 83 mg/dL (ref 70–99)

## 2013-11-18 LAB — COMPREHENSIVE METABOLIC PANEL
ALT: 45 U/L (ref 0–53)
Alkaline Phosphatase: 68 U/L (ref 39–117)
CO2: 26 mEq/L (ref 19–32)
GFR calc Af Amer: >90 mL/min (ref >90)
GFR calc non Af Amer: 90 mL/min — ABNORMAL LOW (ref >90)
Glucose, Bld: 106 mg/dL — ABNORMAL HIGH (ref 70–99)
Potassium: 3.5 mEq/L (ref 3.5–5.1)
Sodium: 143 mEq/L (ref 135–145)
Total Protein: 6.5 g/dL (ref 6.0–8.3)

## 2013-11-18 LAB — URINE MICROSCOPIC-ADD ON

## 2013-11-18 LAB — RAPID URINE DRUG SCREEN, HOSP PERFORMED
Barbiturates: NOT DETECTED
Benzodiazepines: NOT DETECTED

## 2013-11-18 LAB — MRSA PCR SCREENING: MRSA by PCR: NEGATIVE

## 2013-11-18 MED ORDER — SODIUM CHLORIDE 0.9 % IV SOLN
INTRAVENOUS | Status: AC
  Administered 2013-11-18: 09:00:00 via INTRAVENOUS

## 2013-11-18 MED ORDER — INFLUENZA VAC SPLIT QUAD 0.5 ML IM SUSP
0.5000 mL | INTRAMUSCULAR | Status: DC
  Filled 2013-11-18 (×2): qty 0.5

## 2013-11-18 MED ORDER — LORAZEPAM 1 MG PO TABS: 1.0000 mg | ORAL_TABLET | Freq: Four times a day (QID) | ORAL | Status: AC | PRN

## 2013-11-18 MED ORDER — ONDANSETRON HCL 4 MG/2ML IJ SOLN: 4.0000 mg | Freq: Four times a day (QID) | INTRAMUSCULAR | Status: DC | PRN

## 2013-11-18 MED ORDER — ONDANSETRON HCL 4 MG PO TABS: 4.0000 mg | ORAL_TABLET | Freq: Four times a day (QID) | ORAL | Status: DC | PRN

## 2013-11-18 MED ORDER — LACTULOSE 10 GM/15ML PO SOLN
30.0000 g | Freq: Once | ORAL | Status: AC
  Administered 2013-11-18: 30 g via ORAL
  Filled 2013-11-18: qty 45

## 2013-11-18 MED ORDER — SPIRONOLACTONE 100 MG PO TABS
100.0000 mg | ORAL_TABLET | Freq: Every morning | ORAL | Status: DC
  Administered 2013-11-18 – 2013-11-22 (×5): 100 mg via ORAL
  Filled 2013-11-18 (×5): qty 1

## 2013-11-18 MED ORDER — FOLIC ACID 1 MG PO TABS
1.0000 mg | ORAL_TABLET | Freq: Every day | ORAL | Status: DC
  Administered 2013-11-18 – 2013-11-22 (×5): 1 mg via ORAL
  Filled 2013-11-18 (×5): qty 1

## 2013-11-18 MED ORDER — LORAZEPAM 2 MG/ML IJ SOLN
0.0000 mg | Freq: Four times a day (QID) | INTRAMUSCULAR | Status: AC
  Administered 2013-11-18: 2 mg via INTRAVENOUS
  Administered 2013-11-18: 1 mg via INTRAVENOUS
  Administered 2013-11-19 (×2): 2 mg via INTRAVENOUS
  Filled 2013-11-18 (×4): qty 1

## 2013-11-18 MED ORDER — ADULT MULTIVITAMIN W/MINERALS CH
1.0000 | ORAL_TABLET | Freq: Every day | ORAL | Status: DC
  Administered 2013-11-18 – 2013-11-22 (×5): 1 via ORAL
  Filled 2013-11-18 (×5): qty 1

## 2013-11-18 MED ORDER — LORAZEPAM 2 MG/ML IJ SOLN
0.0000 mg | Freq: Four times a day (QID) | INTRAMUSCULAR | Status: AC
  Administered 2013-11-18 – 2013-11-19 (×3): 2 mg via INTRAVENOUS
  Filled 2013-11-18 (×4): qty 1

## 2013-11-18 MED ORDER — HYDROMORPHONE HCL PF 1 MG/ML IJ SOLN
0.5000 mg | INTRAMUSCULAR | Status: DC | PRN
  Administered 2013-11-19: 1 mg via INTRAVENOUS
  Filled 2013-11-18: qty 1

## 2013-11-18 MED ORDER — ZOLPIDEM TARTRATE 5 MG PO TABS: 5.0000 mg | ORAL_TABLET | Freq: Every evening | ORAL | Status: DC | PRN

## 2013-11-18 MED ORDER — SODIUM CHLORIDE 0.9 % IV SOLN
INTRAVENOUS | Status: DC
  Administered 2013-11-21: 09:00:00 via INTRAVENOUS

## 2013-11-18 MED ORDER — LORAZEPAM 2 MG/ML IJ SOLN
0.0000 mg | Freq: Two times a day (BID) | INTRAMUSCULAR | Status: AC
  Administered 2013-11-18 – 2013-11-21 (×2): 1 mg via INTRAVENOUS
  Filled 2013-11-18: qty 1

## 2013-11-18 MED ORDER — LACTULOSE 10 GM/15ML PO SOLN
20.0000 g | Freq: Three times a day (TID) | ORAL | Status: DC
  Administered 2013-11-18 – 2013-11-22 (×13): 20 g via ORAL
  Filled 2013-11-18 (×15): qty 30

## 2013-11-18 MED ORDER — VITAMIN B-1 100 MG PO TABS
100.0000 mg | ORAL_TABLET | Freq: Every day | ORAL | Status: DC
  Administered 2013-11-18 – 2013-11-22 (×5): 100 mg via ORAL
  Filled 2013-11-18 (×5): qty 1

## 2013-11-18 MED ORDER — LORAZEPAM 2 MG/ML IJ SOLN
0.0000 mg | Freq: Two times a day (BID) | INTRAMUSCULAR | Status: AC
  Administered 2013-11-20 – 2013-11-21 (×2): 1 mg via INTRAVENOUS
  Filled 2013-11-18 (×2): qty 1

## 2013-11-18 MED ORDER — ALUM & MAG HYDROXIDE-SIMETH 200-200-20 MG/5ML PO SUSP: 30.0000 mL | Freq: Four times a day (QID) | ORAL | Status: DC | PRN

## 2013-11-18 MED ORDER — LORAZEPAM 2 MG/ML IJ SOLN: 1.0000 mg | Freq: Four times a day (QID) | INTRAMUSCULAR | Status: AC | PRN

## 2013-11-18 MED ORDER — OXYCODONE HCL 5 MG PO TABS
5.0000 mg | ORAL_TABLET | ORAL | Status: DC | PRN
  Administered 2013-11-19 – 2013-11-21 (×7): 5 mg via ORAL
  Filled 2013-11-18 (×7): qty 1

## 2013-11-18 MED ORDER — THIAMINE HCL 100 MG/ML IJ SOLN
100.0000 mg | Freq: Every day | INTRAMUSCULAR | Status: DC
  Filled 2013-11-18 (×5): qty 1

## 2013-11-18 NOTE — ED Provider Notes (Signed)
CSN: [id number]     Arrival date & time 11/17/13  2354 History   First MD Initiated Contact with Patient 11/18/13 0011     Chief Complaint  Patient presents with  . Medical Clearance   (Consider location/radiation/quality/duration/timing/severity/associated sxs/prior Treatment) HPI Comments: Patient states he has had Titus C., and cirrhosis of the liver.  He said several paracenteses last being several months ago.  He's had a 6 pound weight gain in the past 3, days.  He went to his primary care physician today, because he was having abdominal pain, a voice the fact that he wanted to kill himself, she sent him to [facility] for further evaluation.  He is here with IVC papers, complaining of abdominal denies any nausea, vomiting, constipation, or diarrhea, but cannot walk comfortably, without holding his abdomen He continues to drink alcohol on a regular basis  The history is provided by the patient.    Past Medical History  Diagnosis Date  . Cirrhosis of liver   . Hepatitis C 03-28-12    ? 80's-prev. IV drug abuse  . 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 Blood transfusion 03-28-12    '81-hx. of bleeding ulcer  . History of bleeding ulcers 03-28-12    Hx. '81  . GERD (gastroesophageal reflux disease) 03-26-12    Hx. reflux. ulcers in past, occ. OTC med  . Incisional hernia     has drainage system from abdomin  . Subdural hematoma    Past Surgical History  Procedure Laterality Date  . Umbilical hernia repair    . Hernia repair  08/24/11    ventral hernia with mesh  . Hernia repair  11'11    Umbilical  . Peptic ulcer  03-26-12    open peptic ulcer surgery repair- '81  . Incisional hernia repair  03/26/2012  . Incisional hernia repair  03/30/2012    Procedure: LAPAROSCOPIC INCISIONAL HERNIA;  Surgeon: Shelly Rubenstein, MD;  Location: [facility];  Service: General;  Laterality: N/A;  Laparoscopic Incisional/ventral Hernia Repair with Mesh  . Tonsillectomy    .  Appendectomy    . Incisional hernia repair  07/20/2012    mesh  . Incisional hernia repair  07/20/2012    Procedure: HERNIA REPAIR INCISIONAL;  Surgeon: Shelly Rubenstein, MD;  Location: [facility];  Service: General;  Laterality: N/A;  repair of incisional hernia with Biologic mesh   Family History  Problem Relation Age of Onset  . Cancer Mother     breast  . Cancer Father     brain  . Heart disease Brother   . Cancer Sister     breast  . Cancer Sister     breast   History  Substance Use Topics  . Smoking status: Current Every Day Smoker -- 0.25 packs/day for 38 years    Types: Cigarettes  . Smokeless tobacco: Never Used  . Alcohol Use: 21.0 oz/week    35 Cans of beer per week     Comment: 6 beers a week per pt    Review of Systems  Constitutional: Negative for fever.  Respiratory: Positive for shortness of breath.   Gastrointestinal: Positive for abdominal pain. Negative for nausea, vomiting, diarrhea and constipation.  Genitourinary: Negative for dysuria.  Skin: Negative for wound.  All other systems reviewed and are negative.    Allergies  Review of patient's allergies indicates no known allergies.  Home Medications   Current Outpatient Rx  Name  Route  Sig  Dispense  Refill  . folic acid (FOLVITE) 1 MG tablet   Oral   Take 1 tablet (1 mg total) by mouth daily.   30 tablet   0   . Multiple Vitamin (MULITIVITAMIN WITH MINERALS) TABS   Oral   Take 1 tablet by mouth daily.         Marland Kitchen oxyCODONE (OXY IR/ROXICODONE) 5 MG immediate release tablet   Oral   Take 1 tablet (5 mg total) by mouth every 4 (four) hours as needed for pain.   30 tablet   0   . spironolactone (ALDACTONE) 100 MG tablet   Oral   Take 100 mg by mouth every morning.         . thiamine 100 MG tablet   Oral   Take 1 tablet (100 mg total) by mouth daily.   30 tablet   0    BP 150/70  Pulse 83  Temp(Src) 98.1 F (36.7 C) (Oral)  Resp 18  SpO2 98% Physical Exam  Nursing note and  vitals reviewed. Constitutional: He is oriented to person, place, and time. He appears well-developed and well-nourished.  HENT:  Head: Normocephalic.  Eyes: Pupils are equal, round, and reactive to light.  Neck: Normal range of motion.  Cardiovascular: Normal rate and regular rhythm.   Pulmonary/Chest: Effort normal. No respiratory distress. He has no wheezes. He exhibits no tenderness.  Abdominal: He exhibits distension and mass. Bowel sounds are decreased. There is tenderness in the periumbilical area and suprapubic area.    Neurological: He is alert and oriented to person, place, and time.  Skin: Skin is warm. No rash noted.    ED Course  Procedures (including critical care time) Labs Review Labs Reviewed  CBC - Abnormal; Notable for the following:    WBC 3.2 (*)    RBC 3.56 (*)    Hemoglobin 12.0 (*)    HCT 33.4 (*)    Platelets 39 (*)    All other components within normal limits  COMPREHENSIVE METABOLIC PANEL - Abnormal; Notable for the following:    Glucose, Bld 106 (*)    Albumin 2.8 (*)    AST 82 (*)    GFR calc non Af Amer 90 (*)    All other components within normal limits  ETHANOL - Abnormal; Notable for the following:    Alcohol, Ethyl (B) 260 (*)    All other components within normal limits  URINALYSIS, ROUTINE W REFLEX MICROSCOPIC - Abnormal; Notable for the following:    Hgb urine dipstick MODERATE (*)    Urobilinogen, UA 2.0 (*)    All other components within normal limits  AMMONIA - Abnormal; Notable for the following:    Ammonia 109 (*)    All other components within normal limits  URINE RAPID DRUG SCREEN (HOSP PERFORMED)  URINE MICROSCOPIC-ADD ON   Imaging Review Dg Abd Acute W/chest  11/18/2013   CLINICAL DATA:  Abdominal pain. History of cirrhosis and hepatitis-C.  EXAM: ACUTE ABDOMEN SERIES (ABDOMEN 2 VIEW & CHEST 1 VIEW)  COMPARISON:  09/09/2013 and 08/25/2013  and 08/19/2013  FINDINGS: Lungs are clear without focal airspace disease. Heart and  mediastinum are within normal limits. Again noted are surgical clips in the mid upper abdomen. No evidence for free air. There is gas in the colon. Evidence for gallstones in the right upper abdomen.  IMPRESSION: Nonspecific bowel gas pattern.  Cholelithiasis.  No acute chest findings.   Electronically Signed   By: Meriel Pica.D.  On: 11/18/2013 00:49    EKG Interpretation   None       MDM   1. Hepatic encephalopathy     Concern for abdominal mass/obstruction Xray shows no blockages Ammonia level elevated will admit with sitter     Arman Filter, NP 11/18/13 0025  Arman Filter, NP 11/18/13 [phone]

## 2013-11-18 NOTE — H&P (Signed)
[facility] History and Physical  Noah Medina:[id number] DOB: [birth year] DOA: 11/17/2013  Referring physician:  EDP PCP: Iona Hansen, NP  Specialists:   Chief Complaint:   Suicidal   HPI: Noah Medina is a 57 y.o. male with Cirrhosis due to Hepatitis C and a history of Alcohol Abuse who presents to the ED with complaints of "wanting to end it all" .   He reports having sever depression due to his medical conditions and continued illness.   He reports drinking heavily for the past 2 years since he got out of prison and  He reports drinking 3, 40 ounce beers tonight PTA.   He was evaluated in the ED and was found to have an alcohol level of   And an NH3 level of 109.   He was administer 1 dose of lactulose and placed on the ETOH withdrawal protocol and referred for medical admission.       Review of Systems: The patient denies anorexia, fever, chills, headaches, weight loss, vision loss, diplopia, dizziness, decreased hearing, rhinitis, hoarseness, chest pain, syncope, dyspnea on exertion, peripheral edema, balance deficits, cough, hemoptysis, abdominal pain, nausea, vomiting, diarrhea, constipation, hematemesis, melena, hematochezia, severe indigestion/heartburn, dysuria, hematuria, incontinence, muscle weakness, suspicious skin lesions, transient blindness, difficulty walking, depression, unusual weight change, abnormal bleeding, enlarged lymph nodes, angioedema, and breast masses.    Past Medical History  Diagnosis Date  . Cirrhosis of liver   . Hepatitis C 03-28-12    ? 80's-prev. IV drug abuse  . 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 Blood transfusion 03-28-12    '81-hx. of bleeding ulcer  . History of bleeding ulcers 03-28-12    Hx. '81  . GERD (gastroesophageal reflux disease) 03-26-12    Hx. reflux. ulcers in past, occ. OTC med  . Incisional hernia     has drainage system from abdomin  . Subdural hematoma     Past Surgical History   Procedure Laterality Date  . Umbilical hernia repair    . Hernia repair  08/24/11    ventral hernia with mesh  . Hernia repair  11'11    Umbilical  . Peptic ulcer  03-26-12    open peptic ulcer surgery repair- '81  . Incisional hernia repair  03/26/2012  . Incisional hernia repair  03/30/2012    Procedure: LAPAROSCOPIC INCISIONAL HERNIA;  Surgeon: Shelly Rubenstein, MD;  Location: [facility];  Service: General;  Laterality: N/A;  Laparoscopic Incisional/ventral Hernia Repair with Mesh  . Tonsillectomy    . Appendectomy    . Incisional hernia repair  07/20/2012    mesh  . Incisional hernia repair  07/20/2012    Procedure: HERNIA REPAIR INCISIONAL;  Surgeon: Shelly Rubenstein, MD;  Location: [facility];  Service: General;  Laterality: N/A;  repair of incisional hernia with Biologic mesh    Prior to Admission medications   Medication Sig Start Date End Date Taking? Authorizing Provider  folic acid (FOLVITE) 1 MG tablet Take 1 tablet (1 mg total) by mouth daily. 04/25/13   Renae Fickle, MD  Multiple Vitamin (MULITIVITAMIN WITH MINERALS) TABS Take 1 tablet by mouth daily.    Historical Provider, MD  oxyCODONE (OXY IR/ROXICODONE) 5 MG immediate release tablet Take 1 tablet (5 mg total) by mouth every 4 (four) hours as needed for pain. 08/20/13   Catarina Hartshorn, MD  spironolactone (ALDACTONE) 100 MG tablet Take 100 mg by mouth every morning.    Historical Provider, MD  thiamine 100 MG tablet Take 1 tablet (100 mg total) by mouth daily. 04/25/13   Renae Fickle, MD    No Known Allergies  Social History:  reports that he has been smoking Cigarettes.  He has a 9.5 pack-year smoking history. He has never used smokeless tobacco. He reports that he drinks about 21.0 ounces of alcohol per week. He reports that he does not use illicit drugs.     Family History  Problem Relation Age of Onset  . Cancer Mother     breast  . Cancer Father     brain  . Heart disease Brother   . Cancer Sister     breast  .  Cancer Sister     breast    (be sure to complete)   Physical Exam:  GEN:  Pleasant 57 y.o. Caucasian male  examined and in no acute distress; cooperative with exam Filed Vitals:   11/18/13 0024  BP: 150/70  Pulse: 83  Temp: 98.1 F (36.7 C)  TempSrc: Oral  Resp: 18  SpO2: 98%   Blood pressure 150/70, pulse 83, temperature 98.1 F (36.7 C), temperature source Oral, resp. rate 18, SpO2 98.00%. PSYCH: He is alert and oriented x4; does not appear anxious does appear depressed;  HEENT: Normocephalic and Atraumatic, Mucous membranes pink; PERRLA; EOM intact; Fundi:  Benign;  No scleral icterus, Nares: Patent, Oropharynx: Clear, Fair Dentition, Neck:  FROM, no cervical lymphadenopathy nor thyromegaly or carotid bruit; no JVD; Breasts:: Not examined CHEST WALL: No tenderness CHEST: Normal respiration, clear to auscultation bilaterally HEART: Regular rate and rhythm; no murmurs rubs or gallops BACK: No kyphosis or scoliosis; no CVA tenderness ABDOMEN: Positive Bowel Sounds, Large Ventral Hernia present , soft non-tender; no masses, no organomegaly.   Rectal Exam: Not done EXTREMITIES: No cyanosis, clubbing or 2+ EDEMA; no ulcerations. Genitalia: not examined PULSES: 2+ and symmetric SKIN: Normal hydration no rash or ulceration CNS: Cranial nerves 2-12 grossly intact no focal neurologic deficit, No Asterixis.      Labs on Admission:  Basic Metabolic Panel:  Recent Labs Lab 11/18/13 0129  NA 143  K 3.5  CL 109  CO2 26  GLUCOSE 106*  BUN 10  CREATININE 0.96  CALCIUM 8.9   Liver Function Tests:  Recent Labs Lab 11/18/13 0129  AST 82*  ALT 45  ALKPHOS 68  BILITOT 1.1  PROT 6.5  ALBUMIN 2.8*   No results found for this basename: LIPASE, AMYLASE,  in the last 168 hours  Recent Labs Lab 11/18/13 0129  AMMONIA 109*   CBC:  Recent Labs Lab 11/18/13 0129  WBC 3.2*  HGB 12.0*  HCT 33.4*  MCV 93.8  PLT 39*   Cardiac Enzymes: No results found for this  basename: CKTOTAL, CKMB, CKMBINDEX, TROPONINI,  in the last 168 hours  BNP (last 3 results) No results found for this basename: PROBNP,  in the last 8760 hours CBG: No results found for this basename: GLUCAP,  in the last 168 hours  Radiological Exams on Admission: Dg Abd Acute W/chest  11/18/2013   CLINICAL DATA:  Abdominal pain. History of cirrhosis and hepatitis-C.  EXAM: ACUTE ABDOMEN SERIES (ABDOMEN 2 VIEW & CHEST 1 VIEW)  COMPARISON:  09/09/2013 and 08/25/2013  and 08/19/2013  FINDINGS: Lungs are clear without focal airspace disease. Heart and mediastinum are within normal limits. Again noted are surgical clips in the mid upper abdomen. No evidence for free air. There is gas in the colon. Evidence for gallstones in the  right upper abdomen.  IMPRESSION: Nonspecific bowel gas pattern.  Cholelithiasis.  No acute chest findings.   Electronically Signed   By: Richarda Overlie M.D.   On: 11/18/2013 00:49        Assessment/Plan Principal Problem:   Hepatic encephalopathy Active Problems:   Suicidal ideation   Cirrhosis of liver with history of hepatitis C   Alcohol abuse   Alcohol intoxication   Anasarca   Tobacco use disorder   Ventral hernia    1.  Hepatic Encephalopathy-   Start Lactulose q 8 hours, and monitor NH3 levels daily.    2.  Suicidal Ideation- Suicide Precautions, 1:1 Sitter ordered, and Psychiatry consultation needed.     3.  Cirrhosis w/Hepatitis C-  Has sequellae of cirrhosis with Anasarca and Ascites.    4.  Alcohol Abuse/Alcohol Intxication-  Monitor for S/Sxs of ETOH Withdrawal and CIWA Protocol initiated.    5.  Anasarca-  Continue Spironolactone Rx,  Monitor progression of Ascities.   6.  Tobacco Use Disorder- Counseled RE: Tobacco Cessation.  7.   Ventral hernia- history of failed Mesh Repair, chronic.     8.   DVT prophylaxis with SCDs.       Code Status:   FULL CODE Family Communication:    No Family Present  Disposition Plan:  Inpatient   Time  spent:   4 Minutes  Ron Parker [facility] Pager [phone]  If 7PM-7AM, please contact night-coverage [email or web address] Password [email or web address] 11/18/2013, 5:14 AM

## 2013-11-18 NOTE — ED Notes (Signed)
Pt has not had a bowel movement as of this time after administration of lactulose.

## 2013-11-18 NOTE — ED Provider Notes (Signed)
Medical screening examination/treatment/procedure(s) were performed by non-physician practitioner and as supervising physician I was immediately available for consultation/collaboration.   Loren Racer, MD 11/18/13 [phone]

## 2013-11-18 NOTE — Consult Note (Addendum)
[facility] Face-to-Face Psychiatry Consult   Reason for Consult:  Suicidal Referring Physician:  Dr. Carolyne Littles  [name] is an 57 y.o. male.  Assessment: AXIS I:  Alcohol intoxication, alcoholic encephalopathy, early withdrawal, with suicidal ideation vs. Substance induced mood disorder. AXIS II:  Deferred AXIS III:   Past Medical History  Diagnosis Date  . Cirrhosis of liver   . Hepatitis C 03-28-12    ? 80's-prev. IV drug abuse  . 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 Blood transfusion 03-28-12    '81-hx. of bleeding ulcer  . History of bleeding ulcers 03-28-12    Hx. '81  . GERD (gastroesophageal reflux disease) 03-26-12    Hx. reflux. ulcers in past, occ. OTC med  . Incisional hernia     has drainage [place] from abdomin  . Subdural hematoma    AXIS IV:  other psychosocial or environmental problems and problems with access to health care services AXIS V:  41-50 serious symptoms Addendum:  11/19/2013 Patient has been reassessed today and agrees that he does need treatment for his depression. He will need to be IVC'd if he changes his mind. Plan:  1. Recommend continuation of 1:1 due to altered Mentation and safety.  Initiate IVC if patient attempts to leave facility. 2. Recommend in patient psychiatric admission for treatment of depression and further stabilization when he is medically stable. 3. Recommend SW consult to assist with Residential treatment facility if patient desires further treatment for his alcohol dependence.  4. Recommend low dose Prozac 10mg  if MD feels there is not contraindication.  Subjective:   Noah [place]ina is a 57 y.o. male patient admitted with alcohol intoxication, hepatic encephalopathy, with suicidal ideation. He had a BAL upon admission of 269 and an ammonia level of 109. Chart is reviewed and the patient is seen.      Patient is resting comfortably in bed, arouseable to light shake. He states for the past 2-3 days he  has become depressed. He reports some crying,feeling alone, hopeless and helpless.  He doesn't know of any new stressors. He states he has never attempted suicide in the past and denies any previous treatment of psychiatric illness.  He denies any detox or substance abuse treatment in the past.       He is a native of [place] and has one sister who lives in [place], she is not aware that he is in the hospital. He lives in a boarding house and is on disability. He is a high school graduate and reports no legal problems.       Noah Medina states that he was drinking three (40oz ) beers a day for the past year. His total time without alcohol in the last year was 1 week.  He denies any history of withdrawal seizures, but does report drinking to the point of blacking out. He states he has never had a TBI, but has hit his head many times during black outs.  He denies any substance abuse.        For his symptoms of depression he rates his depression at a 5/10 with 10 being the worst. He states he has suicidal thoughts for the past two days. He does endorse having a plan, which is to get his friend's gun and to shoot himself. He can not remember the friend's name.  He denies any HI and states "I shake all the time," when asked about anxiety.  He is currently on an ativan protocol with Lactulose for his encephalopathy. This assessment is terminated as the patient falls back to sleep.         HPI:   HPI Elements:   Location:  In patient [facility]. Quality:  Acute. Severity:  moderate to severe. Timing:  worsening over the past 2-3 days. Duration:  new in onset. Context:  patient reports no previous history of depression and denies any recent events that would cause him to be depressed..  Past Psychiatric History: Past Medical History  Diagnosis Date  . Cirrhosis of liver   . Hepatitis C 03-28-12    ? 80's-prev. IV drug abuse  . 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  Blood transfusion 03-28-12    '81-hx. of bleeding ulcer  . History of bleeding ulcers 03-28-12    Hx. '81  . GERD (gastroesophageal reflux disease) 03-26-12    Hx. reflux. ulcers in past, occ. OTC med  . Incisional hernia     has drainage [place] from abdomin  . Subdural hematoma     reports that he has been smoking Cigarettes.  He has a 9.5 pack-year smoking history. He has never used smokeless tobacco. He reports that he drinks about 21.0 ounces of alcohol per week. He reports that he does not use illicit drugs. Family History  Problem Relation Age of Onset  . Cancer Mother     breast  . Cancer Father     brain  . Heart disease Brother   . Cancer Sister     breast  . Cancer Sister     breast   Allergies:  No Known Allergies  ACT Assessment Complete:  No:   Past Psychiatric History: Diagnosis:  Alcoholic encephalopathy, alcohol intoxication with report of suicidal ideation  Hospitalizations:  Denies detox or rehab  Outpatient Care:  Dr. Zoe Medina  Substance Abuse Care:  denes  Self-Mutilation:  denies  Suicidal Attempts:  denies  Homicidal Behaviors:  denies   Violent Behaviors:     Place of Residence:  Boarding house Marital Status:  divorced Employed/Unemployed:  disability Education:  [employer] Family Supports:  Sister in [place] Objective: Blood pressure 128/74, pulse 86, temperature 99.6 F (37.6 C), temperature source Oral, resp. rate 18, height 5\' 11"  (1.803 m), weight 95.255 kg (210 lb), SpO2 96.00%.Body mass index is 29.3 kg/(m^2). Results for orders placed during the hospital encounter of 11/17/13 (from the past 72 hour(s))  URINE RAPID DRUG SCREEN (HOSP PERFORMED)     Status: None   Collection Time    11/18/13 12:16 AM      Result Value Range   Opiates NONE DETECTED  NONE DETECTED   Cocaine NONE DETECTED  NONE DETECTED   Benzodiazepines NONE DETECTED  NONE DETECTED   Amphetamines NONE DETECTED  NONE DETECTED   Tetrahydrocannabinol NONE DETECTED  NONE  DETECTED   Barbiturates NONE DETECTED  NONE DETECTED   Comment:            DRUG SCREEN FOR MEDICAL PURPOSES     ONLY.  IF CONFIRMATION IS NEEDED     FOR ANY PURPOSE, NOTIFY LAB     WITHIN 5 DAYS.                LOWEST DETECTABLE LIMITS     FOR URINE DRUG SCREEN     Drug Class       Cutoff (ng/mL)     Amphetamine      1000  Barbiturate      200     Benzodiazepine   200     Tricyclics       300     Opiates          300     Cocaine          300     THC              50  URINALYSIS, ROUTINE W REFLEX MICROSCOPIC     Status: Abnormal   Collection Time    11/18/13 12:16 AM      Result Value Range   Color, Urine YELLOW  YELLOW   APPearance CLEAR  CLEAR   Specific Gravity, Urine 1.009  1.005 - 1.030   pH 6.5  5.0 - 8.0   Glucose, UA NEGATIVE  NEGATIVE mg/dL   Hgb urine dipstick MODERATE (*) NEGATIVE   Bilirubin Urine NEGATIVE  NEGATIVE   Ketones, ur NEGATIVE  NEGATIVE mg/dL   Protein, ur NEGATIVE  NEGATIVE mg/dL   Urobilinogen, UA 2.0 (*) 0.0 - 1.0 mg/dL   Nitrite NEGATIVE  NEGATIVE   Leukocytes, UA NEGATIVE  NEGATIVE  URINE MICROSCOPIC-ADD ON     Status: None   Collection Time    11/18/13 12:16 AM      Result Value Range   WBC, UA 0-2  <3 WBC/hpf   RBC / HPF 7-10  <3 RBC/hpf  CBC     Status: Abnormal   Collection Time    11/18/13  1:29 AM      Result Value Range   WBC 3.2 (*) 4.0 - 10.5 K/uL   RBC 3.56 (*) 4.22 - 5.81 MIL/uL   Hemoglobin 12.0 (*) 13.0 - 17.0 g/dL   HCT 08.6 (*) 57.8 - 46.9 %   MCV 93.8  78.0 - 100.0 fL   MCH 33.7  26.0 - 34.0 pg   MCHC 35.9  30.0 - 36.0 g/dL   RDW 62.9  52.8 - 41.3 %   Platelets 39 (*) 150 - 400 K/uL   Comment: SPECIMEN CHECKED FOR CLOTS     PLATELET COUNT CONFIRMED BY SMEAR  COMPREHENSIVE METABOLIC PANEL     Status: Abnormal   Collection Time    11/18/13  1:29 AM      Result Value Range   Sodium 143  135 - 145 mEq/L   Potassium 3.5  3.5 - 5.1 mEq/L   Chloride 109  96 - 112 mEq/L   CO2 26  19 - 32 mEq/L   Glucose, Bld 106 (*)  70 - 99 mg/dL   BUN 10  6 - 23 mg/dL   Creatinine, Ser 2.44  0.50 - 1.35 mg/dL   Calcium 8.9  8.4 - 01.0 mg/dL   Total Protein 6.5  6.0 - 8.3 g/dL   Albumin 2.8 (*) 3.5 - 5.2 g/dL   AST 82 (*) 0 - 37 U/L   ALT 45  0 - 53 U/L   Alkaline Phosphatase 68  39 - 117 U/L   Total Bilirubin 1.1  0.3 - 1.2 mg/dL   GFR calc non Af Amer 90 (*) >90 mL/min   GFR calc Af Amer >90  >90 mL/min   Comment: (NOTE)     The eGFR has been calculated using the CKD EPI equation.     This calculation has not been validated in all clinical situations.     eGFR's persistently <90 mL/min signify possible Chronic Kidney     Disease.  ETHANOL     Status: Abnormal   Collection Time    11/18/13  1:29 AM      Result Value Range   Alcohol, Ethyl (B) 260 (*) 0 - 11 mg/dL   Comment:            LOWEST DETECTABLE LIMIT FOR     SERUM ALCOHOL IS 11 mg/dL     FOR MEDICAL PURPOSES ONLY  AMMONIA     Status: Abnormal   Collection Time    11/18/13  1:29 AM      Result Value Range   Ammonia 109 (*) 11 - 60 umol/L  BASIC METABOLIC PANEL     Status: Abnormal   Collection Time    11/18/13  8:38 AM      Result Value Range   Sodium 143  135 - 145 mEq/L   Potassium 3.7  3.5 - 5.1 mEq/L   Chloride 110  96 - 112 mEq/L   CO2 22  19 - 32 mEq/L   Glucose, Bld 83  70 - 99 mg/dL   BUN 10  6 - 23 mg/dL   Creatinine, Ser 1.61  0.50 - 1.35 mg/dL   Calcium 8.2 (*) 8.4 - 10.5 mg/dL   GFR calc non Af Amer 90 (*) >90 mL/min   GFR calc Af Amer >90  >90 mL/min   Comment: (NOTE)     The eGFR has been calculated using the CKD EPI equation.     This calculation has not been validated in all clinical situations.     eGFR's persistently <90 mL/min signify possible Chronic Kidney     Disease.  CBC     Status: Abnormal   Collection Time    11/18/13  8:38 AM      Result Value Range   WBC 2.0 (*) 4.0 - 10.5 K/uL   RBC 3.42 (*) 4.22 - 5.81 MIL/uL   Hemoglobin 11.7 (*) 13.0 - 17.0 g/dL   HCT 09.6 (*) 04.5 - 40.9 %   MCV 95.3  78.0 -  100.0 fL   MCH 34.2 (*) 26.0 - 34.0 pg   MCHC 35.9  30.0 - 36.0 g/dL   RDW 81.1  91.4 - 78.2 %   Platelets 38 (*) 150 - 400 K/uL   Comment: CONSISTENT WITH PREVIOUS RESULT  MRSA PCR SCREENING     Status: None   Collection Time    11/18/13  1:52 PM      Result Value Range   MRSA by PCR NEGATIVE  NEGATIVE   Comment:            The GeneXpert MRSA Assay (FDA     approved for NASAL specimens     only), is one component of a     comprehensive MRSA colonization     surveillance program. It is not     intended to diagnose MRSA     infection nor to guide or     monitor treatment for     MRSA infections.   Labs are reviewed and are pertinent for increased NH+, BAL 260, UDS negative.  Current Facility-Administered Medications  Medication Dose Route Frequency Provider Last Rate Last Dose  . 0.9 %  sodium chloride infusion   Intravenous STAT Arman Filter, NP 50 mL/hr at 11/18/13 0851    . 0.9 %  sodium chloride infusion   Intravenous Continuous Ron Parker, MD      . alum & mag hydroxide-simeth (MAALOX/MYLANTA) 200-200-20 MG/5ML suspension 30  mL  30 mL Oral Q6H PRN Ron Parker, MD      . folic acid (FOLVITE) tablet 1 mg  1 mg Oral Daily Ron Parker, MD   1 mg at 11/18/13 0945  . HYDROmorphone (DILAUDID) injection 0.5-1 mg  0.5-1 mg Intravenous Q3H PRN Ron Parker, MD      . Melene Muller ON 11/19/2013] influenza vac split quadrivalent PF (FLUARIX) injection 0.5 mL  0.5 mL Intramuscular Tomorrow-1000 Harvette C Lovell Sheehan, MD      . lactulose (CHRONULAC) 10 GM/15ML solution 20 g  20 g Oral TID Ron Parker, MD   20 g at 11/18/13 1534  . LORazepam (ATIVAN) injection 0-4 mg  0-4 mg Intravenous Q6H Arman Filter, NP   2 mg at 11/18/13 1634   Followed by  . [START ON 11/20/2013] LORazepam (ATIVAN) injection 0-4 mg  0-4 mg Intravenous Q12H Arman Filter, NP   1 mg at 11/18/13 0435  . LORazepam (ATIVAN) injection 0-4 mg  0-4 mg Intravenous Q6H Ron Parker, MD   2 mg at  11/18/13 1322   Followed by  . [START ON 11/20/2013] LORazepam (ATIVAN) injection 0-4 mg  0-4 mg Intravenous Q12H Harvette C Jenkins, MD      . LORazepam (ATIVAN) tablet 1 mg  1 mg Oral Q6H PRN Ron Parker, MD       Or  . LORazepam (ATIVAN) injection 1 mg  1 mg Intravenous Q6H PRN Ron Parker, MD      . multivitamin with minerals tablet 1 tablet  1 tablet Oral Daily Ron Parker, MD   1 tablet at 11/18/13 0945  . ondansetron (ZOFRAN) tablet 4 mg  4 mg Oral Q6H PRN Ron Parker, MD       Or  . ondansetron (ZOFRAN) injection 4 mg  4 mg Intravenous Q6H PRN Ron Parker, MD      . oxyCODONE (Oxy IR/ROXICODONE) immediate release tablet 5 mg  5 mg Oral Q4H PRN Ron Parker, MD      . spironolactone (ALDACTONE) tablet 100 mg  100 mg Oral q morning - 10a Ron Parker, MD   100 mg at 11/18/13 0945  . thiamine (VITAMIN B-1) tablet 100 mg  100 mg Oral Daily Ron Parker, MD   100 mg at 11/18/13 0945   Or  . thiamine (B-1) injection 100 mg  100 mg Intravenous Daily Harvette Velora Heckler, MD      . zolpidem (AMBIEN) tablet 5 mg  5 mg Oral QHS PRN Ron Parker, MD        Psychiatric Specialty Exam:     Blood pressure 128/74, pulse 86, temperature 99.6 F (37.6 C), temperature source Oral, resp. rate 18, height 5\' 11"  (1.803 m), weight 95.255 kg (210 lb), SpO2 96.00%.Body mass index is 29.3 kg/(m^2).  General Appearance: Disheveled  Eye Contact::  Minimal  Speech:  Slow  Volume:  Normal  Mood:  Depressed  Affect:  Congruent  Thought Process:  Goal Directed and but delayed and slow  Orientation:  NA  Thought Content:  WDL  Suicidal Thoughts:  Yes.  with intent/plan  Homicidal Thoughts:  No  Memory:  NA  Judgement:  Impaired  Insight:  Shallow  Psychomotor Activity:  Tremor  Concentration:  Poor  Recall:  Fair  Akathisia:  No  Handed:  Right  AIMS (if indicated):     Assets:  Housing  Sleep:  Treatment Plan Summary: Daily contact  with patient to assess and evaluate symptoms and progress in treatment Medication management   Thank you for allowing [place] to participate in the care of the nice gentleman.  Rona Ravens. [place] RPAC 6:44 PM 11/18/2013  I agreed with the findings, treatment and disposition plan of this patient. Kathryne Sharper, MD

## 2013-11-18 NOTE — ED Notes (Signed)
Pt sent here from [facility] for medical clearance  Pt is IVC  Pt states he has cirrhosis of the liver and hep C  Pt states he has fluid collecting in his abdomen  Pt states he has had paracentesis performed before  Pt states he has been going through this for 4 years  Pt states he is tired and wore out and just wants to go on, he is tired of living this way  Paperwork states pt had a gun to his head and was going to kill himself and he states that he can get a gun at any time

## 2013-11-18 NOTE — Progress Notes (Signed)
Utilization Review completed.  

## 2013-11-18 NOTE — Progress Notes (Addendum)
ACY ORSAK is a 57 y.o. WM PMHx Cirrhosis due to Hepatitis C, ongoing ETOH Abuse, nicotine dependence, ventral hernia, syncope. who presents to the ED with complaints of "wanting to end it all" . He reports having sever depression due to his medical conditions and continued illness. He reports drinking heavily for the past 2 years since he got out of prison and He reports drinking 6x 40 ounce beers tonight, usually drinks 3x40 ounce beers per night. He was evaluated in the ED and was found to have an alcohol level of 260 and an NH3 level of 109. He was administer 1 dose of lactulose and placed on the ETOH withdrawal protocol and referred for medical admission.  Patient was admitted at 0 514 on 11/18/2013 by [facility]. TODAY patient states doing well no longer has any suicidal or homicidal ideations, negative pain, negative N./V., negative CP, negative SOB  O; Gen; A./O. x4, NAD, generalized shaking, diaphoretic      Pulm; clear to auscultation bilateral      Card; tachycardic, regular rhythm, negative murmurs rubs gallops, DP/PT pulse 2+ bilateral        Abd; large ventral hernia a large well-healed vertical incision consistent with exploratory laparoscopy,                nontender plus bowel sounds  A/P;  1. alcohol abuse/alcohol intoxication; counseled patient on the seriousness of not allowing [place] to                 administer medication to prevent him from having DTs. Patient voiced that he understood                  nonserious  this was and would allow medication to be administered as ordered. UDS negative   2. suicidal/homicidal ideation; have consulted psychiatry await recommendations      NOTE; full note not required secondary to patient being admitted on 11/18/2013

## 2013-11-19 DIAGNOSIS — F17200 Nicotine dependence, unspecified, uncomplicated: Secondary | ICD-10-CM

## 2013-11-19 DIAGNOSIS — K7460 Unspecified cirrhosis of liver: Secondary | ICD-10-CM

## 2013-11-19 DIAGNOSIS — F329 Major depressive disorder, single episode, unspecified: Secondary | ICD-10-CM

## 2013-11-19 DIAGNOSIS — F332 Major depressive disorder, recurrent severe without psychotic features: Secondary | ICD-10-CM | POA: Diagnosis present

## 2013-11-19 DIAGNOSIS — K439 Ventral hernia without obstruction or gangrene: Secondary | ICD-10-CM

## 2013-11-19 DIAGNOSIS — R45851 Suicidal ideations: Secondary | ICD-10-CM

## 2013-11-19 LAB — AMMONIA: Ammonia: 70 umol/L — ABNORMAL HIGH (ref 11–60)

## 2013-11-19 MED ORDER — FLUOXETINE HCL 10 MG PO CAPS
10.0000 mg | ORAL_CAPSULE | Freq: Every day | ORAL | Status: DC
  Administered 2013-11-19 – 2013-11-22 (×4): 10 mg via ORAL
  Filled 2013-11-19 (×4): qty 1

## 2013-11-19 NOTE — Progress Notes (Signed)
[facility] PROGRESS NOTE  [place] MVH:[id number] DOB: [birth year] DOA: 11/17/2013 PCP: Iona Hansen, NP  Assessment/Plan:  alcohol abuse/alcohol intoxication;  -counseled patient on the seriousness of not allowing [place] to administer medication to prevent him from having DTs.  -Patient only required 4 mg Ativan per CIWA protocol overnight. In the a.m. we'll contact psychiatry for reevaluation for inpatient commitment.  -Continue CIWA protocol -UDS negative   Alcoholic encephalopathy -Ammonia level now down to 70umol/L, patient still has some asterixis -Continue lactulose  Ventral hernia -Stable continue to monitor  suicidal/homicidal ideation; -Psychiatry will be reconsulted in the morning now that patient more stable, for reevaluation  Depression -Per psychiatry recommendations start Prozac 10 mg  Nicotine dependence -Patient currently refusing nicotine patch, would like to go cold [place]    Code Status: Full Family Communication: No family available to discuss plan of care Disposition Plan: Per psychiatry   Consultants:  Psychiatry (Dr.Syed Arfeen)  Procedures:    Antibiotics:   HPI/Subjective: [place] is a 57 y.o. WM PMHx Cirrhosis due to Hepatitis C, ongoing ETOH Abuse, nicotine dependence, ventral hernia, syncope. who presents to the ED with complaints of "wanting to end it all" . He reports having sever depression due to his medical conditions and continued illness. He reports drinking heavily for the past 2 years since he got out of prison and He reports drinking 6x 40 ounce beers tonight, usually drinks 3x40 ounce beers per night. He was evaluated in the ED and was found to have an alcohol level of 260 and an NH3 level of 109. He was administer 1 dose of lactulose and placed on the ETOH withdrawal protocol and referred for medical admission. Patient was admitted at 0 514 on 11/18/2013 by [facility]. 11/18/2013 patient states doing well  no longer has any suicidal or homicidal ideations, negative pain, negative N./V., negative CP, negative SOB. TODAY states negative N./V./D., negative SOB, negative CP, continues to have shakes but decreased from yesterday. States negative homicidal or suicidal ideations. Patient only required 4 mg Ativan per CIWA protocol in the last 24 hours      Objective: Filed Vitals:   11/19/13 1029 11/19/13 1152 11/19/13 1617 11/19/13 1701  BP: 133/75 133/75 138/76 138/76  Pulse: 95 95 84 84  Temp: 98 F (36.7 C)  97.8 F (36.6 C)   TempSrc: Oral  Oral   Resp: 18  16   Height:      Weight:      SpO2: 96%  96%     Intake/Output Summary (Last 24 hours) at 11/19/13 2037 Last data filed at 11/19/13 1949  Gross per 24 hour  Intake    480 ml  Output   1575 ml  Net  -1095 ml   Filed Weights   11/18/13 0820  Weight: 95.255 kg (210 lb)    Exam:   General:  A./O. x4, NAD, mild asterixis  Cardiovascular: Regular rhythm and rate, negative murmurs rubs gallops, DP/PT pulse +1 bilateral  Respiratory: Clear to auscultation bilateral  Abdomen: large ventral hernia a large well-healed vertical incision consistent with exploratory laparoscopy, nontender plus bowel sounds  Musculoskeletal: Negative pedal edema   Data Reviewed: Basic Metabolic Panel:  Recent Labs Lab 11/18/13 0129 11/18/13 0838  NA 143 143  K 3.5 3.7  CL 109 110  CO2 26 22  GLUCOSE 106* 83  BUN 10 10  CREATININE 0.96 0.96  CALCIUM 8.9 8.2*   Liver Function Tests:  Recent Labs Lab 11/18/13  0129  AST 82*  ALT 45  ALKPHOS 68  BILITOT 1.1  PROT 6.5  ALBUMIN 2.8*   No results found for this basename: LIPASE, AMYLASE,  in the last 168 hours  Recent Labs Lab 11/18/13 0129 11/19/13 0543  AMMONIA 109* 70*   CBC:  Recent Labs Lab 11/18/13 0129 11/18/13 0838  WBC 3.2* 2.0*  HGB 12.0* 11.7*  HCT 33.4* 32.6*  MCV 93.8 95.3  PLT 39* 38*   Cardiac Enzymes: No results found for this basename: CKTOTAL,  CKMB, CKMBINDEX, TROPONINI,  in the last 168 hours BNP (last 3 results) No results found for this basename: PROBNP,  in the last 8760 hours CBG: No results found for this basename: GLUCAP,  in the last 168 hours  Recent Results (from the past 240 hour(s))  MRSA PCR SCREENING     Status: None   Collection Time    11/18/13  1:52 PM      Result Value Range Status   MRSA by PCR NEGATIVE  NEGATIVE Final   Comment:            The GeneXpert MRSA Assay (FDA     approved for NASAL specimens     only), is one component of a     comprehensive MRSA colonization     surveillance program. It is not     intended to diagnose MRSA     infection nor to guide or     monitor treatment for     MRSA infections.     Studies: Dg Abd Acute W/chest  11/18/2013   CLINICAL DATA:  Abdominal pain. History of cirrhosis and hepatitis-C.  EXAM: ACUTE ABDOMEN SERIES (ABDOMEN 2 VIEW & CHEST 1 VIEW)  COMPARISON:  09/09/2013 and 08/25/2013  and 08/19/2013  FINDINGS: Lungs are clear without focal airspace disease. Heart and mediastinum are within normal limits. Again noted are surgical clips in the mid upper abdomen. No evidence for free air. There is gas in the colon. Evidence for gallstones in the right upper abdomen.  IMPRESSION: Nonspecific bowel gas pattern.  Cholelithiasis.  No acute chest findings.   Electronically Signed   By: Richarda Overlie M.D.   On: 11/18/2013 00:49    Scheduled Meds: . folic acid  1 mg Oral Daily  . influenza vac split quadrivalent PF  0.5 mL Intramuscular Tomorrow-1000  . lactulose  20 g Oral TID  . LORazepam  0-4 mg Intravenous Q6H   Followed by  . [START ON 11/20/2013] LORazepam  0-4 mg Intravenous Q12H  . LORazepam  0-4 mg Intravenous Q6H   Followed by  . [START ON 11/20/2013] LORazepam  0-4 mg Intravenous Q12H  . multivitamin with minerals  1 tablet Oral Daily  . spironolactone  100 mg Oral q morning - 10a  . thiamine  100 mg Oral Daily   Or  . thiamine  100 mg Intravenous Daily    Continuous Infusions: . sodium chloride      Principal Problem:   Hepatic encephalopathy Active Problems:   Cirrhosis of liver with history of hepatitis C   Alcohol abuse   Suicidal ideation   Alcohol intoxication   Anasarca   Tobacco use disorder   Ventral hernia   Depression    Time spent: 35 minutes   Laelani Vasko, J  [facility] Pager [phone]. If 7PM-7AM, please contact night-coverage at [email or web address], password [facility] 11/19/2013, 8:37 PM  LOS: 2 days

## 2013-11-20 DIAGNOSIS — D696 Thrombocytopenia, unspecified: Secondary | ICD-10-CM

## 2013-11-20 LAB — COMPREHENSIVE METABOLIC PANEL
ALT: 36 U/L (ref 0–53)
Albumin: 2.5 g/dL — ABNORMAL LOW (ref 3.5–5.2)
Alkaline Phosphatase: 69 U/L (ref 39–117)
Calcium: 8.1 mg/dL — ABNORMAL LOW (ref 8.4–10.5)
GFR calc Af Amer: >90 mL/min (ref >90)
Glucose, Bld: 110 mg/dL — ABNORMAL HIGH (ref 70–99)
Sodium: 138 mEq/L (ref 135–145)
Total Protein: 6 g/dL (ref 6.0–8.3)

## 2013-11-20 LAB — CBC
MCH: 33.7 pg (ref 26.0–34.0)
MCHC: 35.9 g/dL (ref 30.0–36.0)
Platelets: 29 10*3/uL — CL (ref 150–400)
WBC: 2.5 10*3/uL — ABNORMAL LOW (ref 4.0–10.5)

## 2013-11-20 LAB — AMMONIA: Ammonia: 79 umol/L — ABNORMAL HIGH (ref 11–60)

## 2013-11-20 NOTE — Progress Notes (Signed)
CRITICAL VALUE ALERT  Critical value received:  Platelets: 29  Date of notification:  11/20/2013  Time of notification:  0630  Critical value read back:yes  Nurse who received alert:  Pilar Plate, RN  MD notified (1st page):  Benedetto Coons, NP (wrong number, at [facility])  Time of first page:  0632  MD notified (2nd page): R. Reidler  Time of second page: [phone]  Responding MD: Tana Coast  Time MD responded:  [phone]

## 2013-11-20 NOTE — Progress Notes (Signed)
Clinical Social Work Department CLINICAL SOCIAL WORK PSYCHIATRY SERVICE LINE ASSESSMENT 11/20/2013  Patient:  Noah Medina  Account:  [id number]  Admit Date:  11/17/2013  Clinical Social Worker:  Unk Lightning, LCSW  Date/Time:  11/20/2013 03:15 PM Referred by:  Physician  Date referred:  11/20/2013 Reason for Referral  Psychosocial assessment   Presenting Symptoms/Problems (In the person's/family's own words):   Psych consulted due to SA and depression.   Abuse/Neglect/Trauma History (check all that apply)  Denies history   Abuse/Neglect/Trauma Comments:   Psychiatric History (check all that apply)  Outpatient treatment   Psychiatric medications:  Prozac 10 mg  Ativan 0-4 mg   Current Mental Health Hospitalizations/Previous Mental Health History:   Patient reports he does not have any outpatient follow up at this time. Patient denies any MH symptoms.   Current provider:   None   Place and Date:   None   Current Medications:   Scheduled Meds:      . FLUoxetine  10 mg Oral Daily  . folic acid  1 mg Oral Daily  . influenza vac split quadrivalent PF  0.5 mL Intramuscular Tomorrow-1000  . lactulose  20 g Oral TID  . LORazepam  0-4 mg Intravenous Q12H  . LORazepam  0-4 mg Intravenous Q12H  . multivitamin with minerals  1 tablet Oral Daily  . spironolactone  100 mg Oral q morning - 10a  . thiamine  100 mg Oral Daily   Or     . thiamine  100 mg Intravenous Daily        Continuous Infusions:      . sodium chloride 10 mL/hr at 11/20/13 0823          PRN Meds:.alum & mag hydroxide-simeth, HYDROmorphone (DILAUDID) injection, LORazepam, LORazepam, ondansetron (ZOFRAN) IV, ondansetron, oxyCODONE, zolpidem       Previous Impatient Admission/Date/Reason:   None reported   Emotional Health / Current Symptoms    Suicide/Self Harm  Suicidal ideation (ex: "I can't take any more,I wish I could disappear")   Suicide attempt in the past:   Patient denies any current SI but per  Mobile Crisis Assessment team, patient called due to SI and reports he was holding a gun to his head. Patient does have access to guns.   Other harmful behavior:   None   Psychotic/Dissociative Symptoms  None reported   Other Psychotic/Dissociative Symptoms:   N/A    Attention/Behavioral Symptoms  Withdrawn   Other Attention / Behavioral Symptoms:   Patient guarded throughout assessment.    Cognitive Impairment  Within Normal Limits   Other Cognitive Impairment:   Patient alert and oriented.    Mood and Adjustment  Guarded    Stress, Anxiety, Trauma, Any Recent Loss/Stressor  None reported   Anxiety (frequency):   N/A   Phobia (specify):   N/A   Compulsive behavior (specify):   N/A   Obsessive behavior (specify):   N/A   Other:   N/A   Substance Abuse/Use  Current substance use   SBIRT completed (please refer for detailed history):  Y  Self-reported substance use:   Patient reports he drinks about 3 40 oz beers a day. Patient reports he has been drinking for several years. Patient is not interested in any substance abuse treatment.   Urinary Drug Screen Completed:  Y Alcohol level:   260    Environmental/Housing/Living Arrangement  Stable housing   Who is in the home:   Boarding house   Emergency contact:  Kathy-sister   Financial  Medicaid   Patient's Strengths and Goals (patient's own words):   Patient reports that child is supportive.   Clinical Social Worker's Interpretive Summary:   CSW received referral to complete psychosocial assessment. CSW reviewed chart which stated that psych MD recommends inpatient treatment at [facility]. CSW met with patient at bedside and introduced myself.    Patient reports he was brought to the hospital because he was feeling weak. Patient reports he is feeling better and feels he can DC soon. CSW spoke with patient regarding DC plans and patient is aware that psych MD is recommending inpt treatment. Patient is  not excited about inpatient treatment and feels it is not needed. Patient denies any current SI or HI.    Patient reports limited informal support. Patient denies any MH concerns and denies any depression so therefore does not have any outpatient follow up. Patient guarded throughout assessment and appears to minimize alcohol use. Patient completed SBIRT but denies that he needs any treatment.    CSW will continue to follow and will assist with placement once medically stable.   Disposition:  Inpatient needed  [place], [place] [phone]

## 2013-11-20 NOTE — Progress Notes (Addendum)
Chaplain provided support with pt in response to spiritual care consult.   Pt welcoming of chaplain presence, not forthcoming about needs.  In conversation with chaplain, pt expresses feeling that he is not going to kill himself and feels he had a "moment of weakness."  Chaplain provided support around losses and life changes related to pt's illness, lack of support, death of pt's brother in 01/07/2012.   Pt spoke with chaplain and engaged in some grief work around lifestyle that led to his acquiring illnesses - stating that he had shared needles with his brother (deceased).  Will continue to follow for support at [facility] and if admitted to [facility].   Belva Crome MDiv

## 2013-11-20 NOTE — Progress Notes (Signed)
[facility] PROGRESS NOTE  [name] XBJ:[id number] DOB: 29-Dec-1955 DOA: 11/17/2013 PCP: Iona Hansen, NP  Assessment/Plan:  alcohol abuse/alcohol intoxication;  -counseled patient on the seriousness of not allowing [place] to administer medication to prevent him from having DTs.  -Patient only required 3 mg Ativan per CIWA protocol overnight. In the a.m. we'll contact psychiatry for reevaluation for inpatient commitment.  -Continue CIWA protocol -UDS negative   Alcoholic encephalopathy -Ammonia level now 79umol/L, patient still has some very mild asterixis -Continue lactulose  Ventral hernia -Stable continue to monitor  suicidal/homicidal ideation; -Psychiatry will be reconsulted in the morning now that patient more stable, for reevaluation  Depression -Per psychiatry recommendations start Prozac 10 mg  Nicotine dependence -Patient currently refusing nicotine patch, would like to go cold [place]    Code Status: Full Family Communication: No family available to discuss plan of care Disposition Plan: Per psychiatry   Consultants:  Psychiatry (Dr.Syed Arfeen)  Procedures:    Antibiotics:   HPI/Subjective: Noah Medina is a 57 y.o. WM PMHx Cirrhosis due to Hepatitis C, ongoing ETOH Abuse, nicotine dependence, ventral hernia, syncope. who presents to the ED with complaints of "wanting to end it all" . He reports having sever depression due to his medical conditions and continued illness. He reports drinking heavily for the past 2 years since he got out of prison and He reports drinking 6x 40 ounce beers tonight, usually drinks 3x40 ounce beers per night. He was evaluated in the ED and was found to have an alcohol level of 260 and an NH3 level of 109. He was administer 1 dose of lactulose and placed on the ETOH withdrawal protocol and referred for medical admission. Patient was admitted at 0 514 on 11/18/2013 by [facility]. 11/18/2013 patient states doing  well no longer has any suicidal or homicidal ideations, negative pain, negative N./V., negative CP, negative SOB. 11/19/2013 states negative N./V./D., negative SOB, negative CP, continues to have shakes but decreased from yesterday. States negative homicidal or suicidal ideations. Patient only required 4 mg Ativan per CIWA protocol in the last 24 hours TODAY has eaten all meals with negative N./V./D., negative SOB, negative CP. Requested no when he can go home.     Objective: Filed Vitals:   11/20/13 0802 11/20/13 1322 11/20/13 1841 11/20/13 1943  BP: 135/76 153/75 147/70 136/73  Pulse: 83 93 97 97  Temp:  97.6 F (36.4 C) 98.4 F (36.9 C) 98.8 F (37.1 C)  TempSrc:  Oral Oral Oral  Resp:  18 16   Height:      Weight:      SpO2: 95% 97% 98% 99%    Intake/Output Summary (Last 24 hours) at 11/20/13 2003 Last data filed at 11/20/13 1943  Gross per 24 hour  Intake 1493.33 ml  Output   2050 ml  Net -556.67 ml   Filed Weights   11/18/13 0820  Weight: 95.255 kg (210 lb)    Exam:   General:  A./O. x4, NAD, mild asterixis  Cardiovascular: Regular rhythm and rate, negative murmurs rubs gallops, DP/PT pulse +1 bilateral  Respiratory: Clear to auscultation bilateral  Abdomen: large ventral hernia a large well-healed vertical incision consistent with exploratory laparoscopy, nontender plus bowel sounds  Musculoskeletal: Negative pedal edema, very mild fine asterixis   Data Reviewed: Basic Metabolic Panel:  Recent Labs Lab 11/18/13 0129 11/18/13 0838 11/20/13 0532  NA 143 143 138  K 3.5 3.7 3.5  CL 109 110 107  CO2 26 22  23  GLUCOSE 106* 83 110*  BUN 10 10 12   CREATININE 0.96 0.96 0.90  CALCIUM 8.9 8.2* 8.1*  MG  --   --  1.6   Liver Function Tests:  Recent Labs Lab 11/18/13 0129 11/20/13 0532  AST 82* 63*  ALT 45 36  ALKPHOS 68 69  BILITOT 1.1 1.8*  PROT 6.5 6.0  ALBUMIN 2.8* 2.5*   No results found for this basename: LIPASE, AMYLASE,  in the last 168  hours  Recent Labs Lab 11/18/13 0129 11/19/13 0543 11/20/13 0532  AMMONIA 109* 70* 79*   CBC:  Recent Labs Lab 11/18/13 0129 11/18/13 0838 11/20/13 0532  WBC 3.2* 2.0* 2.5*  HGB 12.0* 11.7* 11.6*  HCT 33.4* 32.6* 32.3*  MCV 93.8 95.3 93.9  PLT 39* 38* 29*   Cardiac Enzymes: No results found for this basename: CKTOTAL, CKMB, CKMBINDEX, TROPONINI,  in the last 168 hours BNP (last 3 results) No results found for this basename: PROBNP,  in the last 8760 hours CBG: No results found for this basename: GLUCAP,  in the last 168 hours  Recent Results (from the past 240 hour(s))  MRSA PCR SCREENING     Status: None   Collection Time    11/18/13  1:52 PM      Result Value Range Status   MRSA by PCR NEGATIVE  NEGATIVE Final   Comment:            The GeneXpert MRSA Assay (FDA     approved for NASAL specimens     only), is one component of a     comprehensive MRSA colonization     surveillance program. It is not     intended to diagnose MRSA     infection nor to guide or     monitor treatment for     MRSA infections.     Studies: No results found.  Scheduled Meds: . FLUoxetine  10 mg Oral Daily  . folic acid  1 mg Oral Daily  . influenza vac split quadrivalent PF  0.5 mL Intramuscular Tomorrow-1000  . lactulose  20 g Oral TID  . LORazepam  0-4 mg Intravenous Q12H  . LORazepam  0-4 mg Intravenous Q12H  . multivitamin with minerals  1 tablet Oral Daily  . spironolactone  100 mg Oral q morning - 10a  . thiamine  100 mg Oral Daily   Or  . thiamine  100 mg Intravenous Daily   Continuous Infusions: . 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 If 7PM-7AM, please contact night-coverage at [email or web address], password [facility] 11/20/2013, 8:03 PM  LOS: 3 days

## 2013-11-21 LAB — CBC
Hemoglobin: 11.8 g/dL — ABNORMAL LOW (ref 13.0–17.0)
MCH: 33.7 pg (ref 26.0–34.0)
MCV: 94 fL (ref 78.0–100.0)
Platelets: 30 10*3/uL — ABNORMAL LOW (ref 150–400)
RBC: 3.5 MIL/uL — ABNORMAL LOW (ref 4.22–5.81)
WBC: 3.9 10*3/uL — ABNORMAL LOW (ref 4.0–10.5)

## 2013-11-21 LAB — COMPREHENSIVE METABOLIC PANEL
ALT: 38 U/L (ref 0–53)
AST: 66 U/L — ABNORMAL HIGH (ref 0–37)
Albumin: 2.7 g/dL — ABNORMAL LOW (ref 3.5–5.2)
Alkaline Phosphatase: 81 U/L (ref 39–117)
CO2: 22 mEq/L (ref 19–32)
Chloride: 104 mEq/L (ref 96–112)
Creatinine, Ser: 0.96 mg/dL (ref 0.50–1.35)
GFR calc non Af Amer: 90 mL/min — ABNORMAL LOW (ref >90)
Potassium: 3.7 mEq/L (ref 3.5–5.1)
Sodium: 135 mEq/L (ref 135–145)
Total Bilirubin: 1.8 mg/dL — ABNORMAL HIGH (ref 0.3–1.2)

## 2013-11-21 LAB — AMMONIA: Ammonia: 56 umol/L (ref 11–60)

## 2013-11-21 NOTE — Consult Note (Signed)
Psychiatry Follow up Note    Assessment: AXIS I:  Alcohol intoxication, alcoholic encephalopathy, early withdrawal, with suicidal ideation vs. Substance induced mood disorder. AXIS II:  Deferred AXIS III:   Past Medical History  Diagnosis Date  . Cirrhosis of liver   . Hepatitis C 03-28-12    ? 80's-prev. IV drug abuse  . 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 Blood transfusion 03-28-12    '81-hx. of bleeding ulcer  . History of bleeding ulcers 03-28-12    Hx. '81  . GERD (gastroesophageal reflux disease) 03-26-12    Hx. reflux. ulcers in past, occ. OTC med  . Incisional hernia     has drainage system from abdomin  . Subdural hematoma    AXIS IV:  other psychosocial or environmental problems and problems with access to health care services AXIS V:  41-50 serious symptoms Addendum:  11/21/2013 Subjective:   Noah Medina is a 57 y.o. male patient admitted with alcohol intoxication, hepatic encephalopathy, with suicidal ideation. He had a BAL upon admission of 269 and an ammonia level of 109. Chart is reviewed and the patient is seen.  Patient is doing much better with the shakes.  He has mild tremors but patient told he has family history of tremors.  He is more alert and less confused.  He denies any suicidal thoughts.  He told he may have said something about suicide but he was intoxicated.  He like to get outpatient treatment.  He lives by himself.  He had support from her sister.  He wants her to a meeting.  He likes the Prozac.  Denies any tremors.  He is sleeping better.  Denies any paranoia, hallucination or any irritability.  He is not interested in any inpatient psychiatric treatment.  He was in the AA meeting in the past.  He also got some help when he was in prison for manslaughter for 10 years.  He released from the prison in 2012.  He is on disability.  He denies any current treatment with a psychiatrist however he like to see psychiatrist and therapist  outpatient.    Past Psychiatric History: Past Medical History  Diagnosis Date  . Cirrhosis of liver   . Hepatitis C 03-28-12    ? 80's-prev. IV drug abuse  . 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 Blood transfusion 03-28-12    '81-hx. of bleeding ulcer  . History of bleeding ulcers 03-28-12    Hx. '81  . GERD (gastroesophageal reflux disease) 03-26-12    Hx. reflux. ulcers in past, occ. OTC med  . Incisional hernia     has drainage system from abdomin  . Subdural hematoma     reports that he has been smoking Cigarettes.  He has a 9.5 pack-year smoking history. He has never used smokeless tobacco. He reports that he drinks about 21.0 ounces of alcohol per week. He reports that he does not use illicit drugs. Family History  Problem Relation Age of Onset  . Cancer Mother     breast  . Cancer Father     brain  . Heart disease Brother   . Cancer Sister     breast  . Cancer Sister     breast   Allergies:  No Known Allergies  ACT Assessment Complete:  No:   Past Psychiatric History: Diagnosis:  Alcoholic encephalopathy, alcohol intoxication with report of suicidal ideation  Hospitalizations:  Denies detox or rehab  Outpatient Care:  Dr. Zoe Lan  Substance Abuse Care:  denes  Self-Mutilation:  denies  Suicidal Attempts:  denies  Homicidal Behaviors:  denies   Violent Behaviors:     Place of Residence:  Boarding house Marital Status:  divorced Employed/Unemployed:  disability Education:  [employer] Family Supports:  Sister in [place] Objective: Blood pressure 149/77, pulse 96, temperature 98 F (36.7 C), temperature source Oral, resp. rate 18, height 5\' 11"  (1.803 m), weight 210 lb (95.255 kg), SpO2 96.00%.Body mass index is 29.3 kg/(m^2). Results for orders placed during the hospital encounter of 11/17/13 (from the past 72 hour(s))  MRSA PCR SCREENING     Status: None   Collection Time    11/18/13  1:52 PM      Result Value Range   MRSA by PCR  NEGATIVE  NEGATIVE   Comment:            The GeneXpert MRSA Assay (FDA     approved for NASAL specimens     only), is one component of a     comprehensive MRSA colonization     surveillance program. It is not     intended to diagnose MRSA     infection nor to guide or     monitor treatment for     MRSA infections.  AMMONIA     Status: Abnormal   Collection Time    11/19/13  5:43 AM      Result Value Range   Ammonia 70 (*) 11 - 60 umol/L  AMMONIA     Status: Abnormal   Collection Time    11/20/13  5:32 AM      Result Value Range   Ammonia 79 (*) 11 - 60 umol/L  CBC     Status: Abnormal   Collection Time    11/20/13  5:32 AM      Result Value Range   WBC 2.5 (*) 4.0 - 10.5 K/uL   RBC 3.44 (*) 4.22 - 5.81 MIL/uL   Hemoglobin 11.6 (*) 13.0 - 17.0 g/dL   HCT 09.8 (*) 11.9 - 14.7 %   MCV 93.9  78.0 - 100.0 fL   MCH 33.7  26.0 - 34.0 pg   MCHC 35.9  30.0 - 36.0 g/dL   RDW 82.9  56.2 - 13.0 %   Platelets 29 (*) 150 - 400 K/uL   Comment: SPECIMEN CHECKED FOR CLOTS     REPEATED TO VERIFY     CRITICAL RESULT CALLED TO, READ BACK BY AND VERIFIED WITH:     JGOOD RN AT 0630 ON [id number] BY DLONG  COMPREHENSIVE METABOLIC PANEL     Status: Abnormal   Collection Time    11/20/13  5:32 AM      Result Value Range   Sodium 138  135 - 145 mEq/L   Potassium 3.5  3.5 - 5.1 mEq/L   Chloride 107  96 - 112 mEq/L   CO2 23  19 - 32 mEq/L   Glucose, Bld 110 (*) 70 - 99 mg/dL   BUN 12  6 - 23 mg/dL   Creatinine, Ser 6.96  0.50 - 1.35 mg/dL   Calcium 8.1 (*) 8.4 - 10.5 mg/dL   Total Protein 6.0  6.0 - 8.3 g/dL   Albumin 2.5 (*) 3.5 - 5.2 g/dL   AST 63 (*) 0 - 37 U/L   ALT 36  0 - 53 U/L   Alkaline Phosphatase 69  39 - 117 U/L   Total Bilirubin 1.8 (*)  0.3 - 1.2 mg/dL   GFR calc non Af Amer >90  >90 mL/min   GFR calc Af Amer >90  >90 mL/min   Comment: (NOTE)     The eGFR has been calculated using the CKD EPI equation.     This calculation has not been validated in all clinical situations.      eGFR's persistently <90 mL/min signify possible Chronic Kidney     Disease.  MAGNESIUM     Status: None   Collection Time    11/20/13  5:32 AM      Result Value Range   Magnesium 1.6  1.5 - 2.5 mg/dL  AMMONIA     Status: None   Collection Time    11/21/13  6:00 AM      Result Value Range   Ammonia 56  11 - 60 umol/L  CBC     Status: Abnormal   Collection Time    11/21/13  6:00 AM      Result Value Range   WBC 3.9 (*) 4.0 - 10.5 K/uL   RBC 3.50 (*) 4.22 - 5.81 MIL/uL   Hemoglobin 11.8 (*) 13.0 - 17.0 g/dL   HCT 16.1 (*) 09.6 - 04.5 %   MCV 94.0  78.0 - 100.0 fL   MCH 33.7  26.0 - 34.0 pg   MCHC 35.9  30.0 - 36.0 g/dL   RDW 40.9  81.1 - 91.4 %   Platelets 30 (*) 150 - 400 K/uL   Comment: CONSISTENT WITH PREVIOUS RESULT     SPECIMEN CHECKED FOR CLOTS  COMPREHENSIVE METABOLIC PANEL     Status: Abnormal   Collection Time    11/21/13  6:00 AM      Result Value Range   Sodium 135  135 - 145 mEq/L   Potassium 3.7  3.5 - 5.1 mEq/L   Chloride 104  96 - 112 mEq/L   CO2 22  19 - 32 mEq/L   Glucose, Bld 104 (*) 70 - 99 mg/dL   BUN 11  6 - 23 mg/dL   Creatinine, Ser 7.82  0.50 - 1.35 mg/dL   Calcium 8.3 (*) 8.4 - 10.5 mg/dL   Total Protein 6.3  6.0 - 8.3 g/dL   Albumin 2.7 (*) 3.5 - 5.2 g/dL   AST 66 (*) 0 - 37 U/L   ALT 38  0 - 53 U/L   Alkaline Phosphatase 81  39 - 117 U/L   Total Bilirubin 1.8 (*) 0.3 - 1.2 mg/dL   GFR calc non Af Amer 90 (*) >90 mL/min   GFR calc Af Amer >90  >90 mL/min   Comment: (NOTE)     The eGFR has been calculated using the CKD EPI equation.     This calculation has not been validated in all clinical situations.     eGFR's persistently <90 mL/min signify possible Chronic Kidney     Disease.  MAGNESIUM     Status: None   Collection Time    11/21/13  6:00 AM      Result Value Range   Magnesium 1.5  1.5 - 2.5 mg/dL   Labs are reviewed and are pertinent for increased NH+, BAL 260, UDS negative.  Current Facility-Administered Medications   Medication Dose Route Frequency Provider Last Rate Last Dose  . 0.9 %  sodium chloride infusion   Intravenous Continuous Ron Parker, MD 10 mL/hr at 11/21/13 [phone]    . alum & mag hydroxide-simeth (MAALOX/MYLANTA) 200-200-20 MG/5ML suspension 30  mL  30 mL Oral Q6H PRN Ron Parker, MD      . FLUoxetine (PROZAC) capsule 10 mg  10 mg Oral Daily Drema Dallas, MD   10 mg at 11/21/13 0946  . folic acid (FOLVITE) tablet 1 mg  1 mg Oral Daily Ron Parker, MD   1 mg at 11/21/13 0947  . HYDROmorphone (DILAUDID) injection 0.5-1 mg  0.5-1 mg Intravenous Q3H PRN Ron Parker, MD   1 mg at 11/19/13 1433  . influenza vac split quadrivalent PF (FLUARIX) injection 0.5 mL  0.5 mL Intramuscular Tomorrow-1000 Harvette C Jenkins, MD      . lactulose (CHRONULAC) 10 GM/15ML solution 20 g  20 g Oral TID Ron Parker, MD   20 g at 11/21/13 0949  . LORazepam (ATIVAN) injection 0-4 mg  0-4 mg Intravenous Q12H Arman Filter, NP   1 mg at 11/21/13 0543  . LORazepam (ATIVAN) injection 0-4 mg  0-4 mg Intravenous Q12H Ron Parker, MD   1 mg at 11/21/13 1610  . multivitamin with minerals tablet 1 tablet  1 tablet Oral Daily Ron Parker, MD   1 tablet at 11/21/13 0947  . ondansetron (ZOFRAN) tablet 4 mg  4 mg Oral Q6H PRN Ron Parker, MD       Or  . ondansetron (ZOFRAN) injection 4 mg  4 mg Intravenous Q6H PRN Ron Parker, MD      . oxyCODONE (Oxy IR/ROXICODONE) immediate release tablet 5 mg  5 mg Oral Q4H PRN Ron Parker, MD   5 mg at 11/21/13 0039  . spironolactone (ALDACTONE) tablet 100 mg  100 mg Oral q morning - 10a Ron Parker, MD   100 mg at 11/21/13 0947  . thiamine (VITAMIN B-1) tablet 100 mg  100 mg Oral Daily Ron Parker, MD   100 mg at 11/21/13 9604   Or  . thiamine (B-1) injection 100 mg  100 mg Intravenous Daily Harvette Velora Heckler, MD      . zolpidem (AMBIEN) tablet 5 mg  5 mg Oral QHS PRN Ron Parker, MD        Psychiatric  Specialty Exam:     Blood pressure 149/77, pulse 96, temperature 98 F (36.7 C), temperature source Oral, resp. rate 18, height 5\' 11"  (1.803 m), weight 210 lb (95.255 kg), SpO2 96.00%.Body mass index is 29.3 kg/(m^2).  General Appearance: Casual  Eye Contact::  Minimal  Speech:  Slow  Volume:  Normal  Mood:  Anxious  Affect:  Congruent  Thought Process:  Goal Directed and but delayed and slow  Orientation:  NA  Thought Content:  WDL  Suicidal Thoughts:  No  Homicidal Thoughts:  No  Memory:  NA  Judgement:  Fair  Insight:  Fair and Shallow  Psychomotor Activity:  Normal  Concentration:  Fair  Recall:  Good  Akathisia:  No  Handed:  Right  AIMS (if indicated):     Assets:  Housing  Sleep:      Treatment Firefighter.  Patient can be discharged to outpatient services at [facility].  Social worker please arrange outpatient appointment to be continue Prozac at present dose.  Patient does not require inpatient services.  Patient does not meet criteria for involuntary commitment. Please call [phone] if you have any further questions.  Kathryne Sharper, MD

## 2013-11-21 NOTE — Progress Notes (Signed)
[facility] PROGRESS NOTE  Noah Medina:[id number] DOB: Jan 06, 1956 DOA: 11/17/2013 PCP: Iona Hansen, NP  Assessment/Plan:  alcohol abuse/alcohol intoxication;  -counseled patient on the seriousness of not allowing [place] to administer medication to prevent him from having DTs should agree to cooperate.  -Patient only required 3 mg Ativan per CIWA protocol overnight. - In the a.m. will determine if patient is ready for discharge (off CIWA protocol) -Psychiatry has declined to have patient admitted against his will..  -If patient off CIWA protocol in the a.m. patient may be discharged home -UDS negative   Alcoholic encephalopathy -Ammonia level now 56umol/L, patient still has some very mild asterixis -Continue lactulose  Ventral hernia -Stable continue to monitor  suicidal/homicidal ideation; -Psychiatry immunizations are; can be discharged to outpatient services at [facility]   Depression -Per psychiatry recommendations start Prozac 10 mg  Nicotine dependence -Patient currently refusing nicotine patch, would like to go cold [place]    Code Status: Full Family Communication: No family available to discuss plan of care Disposition Plan: Per psychiatry   Consultants:  Psychiatry (Dr.Syed Arfeen)  Procedures:    Antibiotics:   HPI/Subjective: Noah Medina is a 57 y.o. WM PMHx Cirrhosis due to Hepatitis C, ongoing ETOH Abuse, nicotine dependence, ventral hernia, syncope. who presents to the ED with complaints of "wanting to end it all" . He reports having sever depression due to his medical conditions and continued illness. He reports drinking heavily for the past 2 years since he got out of prison and He reports drinking 6x 40 ounce beers tonight, usually drinks 3x40 ounce beers per night. He was evaluated in the ED and was found to have an alcohol level of 260 and an NH3 level of 109. He was administer 1 dose of lactulose and placed on the ETOH withdrawal  protocol and referred for medical admission. Patient was admitted at 0 514 on 11/18/2013 by [facility]. 11/18/2013 patient states doing well no longer has any suicidal or homicidal ideations, negative pain, negative N./V., negative CP, negative SOB. 11/19/2013 states negative N./V./D., negative SOB, negative CP, continues to have shakes but decreased from yesterday. States negative homicidal or suicidal ideations. Patient only required 4 mg Ativan per CIWA protocol in the last 24 hours 11/20/2013 has eaten all meals with negative N./V./D., negative SOB, negative CP. Requested no when he can go home. TODAY patient feeling greatly improved and would like to be discharged. Counseled patient that if he was still needing Ativan per our protocol he could not be discharged.     Objective: Filed Vitals:   11/20/13 1943 11/21/13 0016 11/21/13 0543 11/21/13 1426  BP: 136/73 147/66 149/77 143/76  Pulse: 97 108 96 104  Temp: 98.8 F (37.1 C) 98.8 F (37.1 C) 98 F (36.7 C) 98 F (36.7 C)  TempSrc: Oral Oral Oral Oral  Resp:   18 18  Height:      Weight:      SpO2: 99% 97% 96% 96%    Intake/Output Summary (Last 24 hours) at 11/21/13 1928 Last data filed at 11/21/13 1822  Gross per 24 hour  Intake 1479.83 ml  Output   3875 ml  Net -2395.17 ml   Filed Weights   11/18/13 0820  Weight: 95.255 kg (210 lb)    Exam:   General:  A./O. x4, NAD, mild asterixis  Cardiovascular: Regular rhythm and rate, negative murmurs rubs gallops, DP/PT pulse +1 bilateral  Respiratory: Clear to auscultation bilateral  Abdomen: large ventral hernia a  large well-healed vertical incision consistent with exploratory laparoscopy, nontender plus bowel sounds  Musculoskeletal: Negative pedal edema, asterixis almost fully resolved    Data Reviewed: Basic Metabolic Panel:  Recent Labs Lab 11/18/13 0129 11/18/13 0838 11/20/13 0532 11/21/13 0600  NA 143 143 138 135  K 3.5 3.7 3.5 3.7  CL 109 110 107 104   CO2 26 22 23 22   GLUCOSE 106* 83 110* 104*  BUN 10 10 12 11   CREATININE 0.96 0.96 0.90 0.96  CALCIUM 8.9 8.2* 8.1* 8.3*  MG  --   --  1.6 1.5   Liver Function Tests:  Recent Labs Lab 11/18/13 0129 11/20/13 0532 11/21/13 0600  AST 82* 63* 66*  ALT 45 36 38  ALKPHOS 68 69 81  BILITOT 1.1 1.8* 1.8*  PROT 6.5 6.0 6.3  ALBUMIN 2.8* 2.5* 2.7*   No results found for this basename: LIPASE, AMYLASE,  in the last 168 hours  Recent Labs Lab 11/18/13 0129 11/19/13 0543 11/20/13 0532 11/21/13 0600  AMMONIA 109* 70* 79* 56   CBC:  Recent Labs Lab 11/18/13 0129 11/18/13 0838 11/20/13 0532 11/21/13 0600  WBC 3.2* 2.0* 2.5* 3.9*  HGB 12.0* 11.7* 11.6* 11.8*  HCT 33.4* 32.6* 32.3* 32.9*  MCV 93.8 95.3 93.9 94.0  PLT 39* 38* 29* 30*   Cardiac Enzymes: No results found for this basename: CKTOTAL, CKMB, CKMBINDEX, TROPONINI,  in the last 168 hours BNP (last 3 results) No results found for this basename: PROBNP,  in the last 8760 hours CBG: No results found for this basename: GLUCAP,  in the last 168 hours  Recent Results (from the past 240 hour(s))  MRSA PCR SCREENING     Status: None   Collection Time    11/18/13  1:52 PM      Result Value Range Status   MRSA by PCR NEGATIVE  NEGATIVE Final   Comment:            The GeneXpert MRSA Assay (FDA     approved for NASAL specimens     only), is one component of a     comprehensive MRSA colonization     surveillance program. It is not     intended to diagnose MRSA     infection nor to guide or     monitor treatment for     MRSA infections.     Studies: No results found.  Scheduled Meds: . FLUoxetine  10 mg Oral Daily  . folic acid  1 mg Oral Daily  . influenza vac split quadrivalent PF  0.5 mL Intramuscular Tomorrow-1000  . lactulose  20 g Oral TID  . LORazepam  0-4 mg Intravenous Q12H  . LORazepam  0-4 mg Intravenous Q12H  . multivitamin with minerals  1 tablet Oral Daily  . spironolactone  100 mg Oral q  morning - 10a  . thiamine  100 mg Oral Daily   Or  . thiamine  100 mg Intravenous Daily   Continuous Infusions: . sodium chloride 10 mL/hr at 11/21/13 7829    Principal Problem:   Hepatic encephalopathy Active Problems:   Cirrhosis of liver with history of hepatitis C   Alcohol abuse   Suicidal ideation   Alcohol intoxication   Anasarca   Tobacco use disorder   Ventral hernia   Depression    Time spent: 35 minutes   Bryce Kimble, J  [facility] Pager [phone]. If 7PM-7AM, please contact night-coverage at [email or web address], password [facility] 11/21/2013, 7:28 PM  LOS:  4 days

## 2013-11-21 NOTE — Progress Notes (Signed)
Clinical Social Work Progress Note PSYCHIATRY SERVICE LINE 11/21/2013  Patient:  Noah Medina  Account:  [id number]  Admit Date:  11/17/2013  Clinical Social Worker:  Unk Lightning, LCSW  Date/Time:  11/21/2013 02:15 PM  Review of Patient  Overall Medical Condition:   Patient reports he feels better and is anxious to leave.   Participation Level:  Minimal  Participation Quality  Appropriate   Other Participation Quality:   Patient alert and oriented.   Affect  Anxious   Cognitive  Appropriate   Reaction to Medications/Concerns:   None reported   Modes of Intervention  Support   Summary of Progress/Plan at Discharge   Per chart review, psych MD feels that patient can follow up on outpatient basis.    CSW met with patient at bedside. Patient laying in bed and reports he wants to leave. CSW explained that attending MD would make decision when patient medically stable. Patient reports psych MD said he could follow up on outpatient basis and that is what he prefers. CSW inquired about previous outpatient follow up and patient reports he has never had any treatment.    Patient reports he takes the bus and needs a facility that can accept Medicaid. CSW provided referral to [facility] and placed information on AVS. CSW explained that patient has to go to walk-in clinic for first appointment to complete assessment. Patient voiced understanding and agreeable to outpatient.    Patient anxious and continues to report that he wants to DC. Patient denies any SI or HI and contracts for safety. Patient has information and reports no further needs at this time. CSW will continue to follow to provide support during hospitalization.      [place], [place] [phone]

## 2013-11-22 LAB — MAGNESIUM: Magnesium: 1.5 mg/dL (ref 1.5–2.5)

## 2013-11-22 LAB — COMPREHENSIVE METABOLIC PANEL WITH GFR
ALT: 42 U/L (ref 0–53)
AST: 69 U/L — ABNORMAL HIGH (ref 0–37)
Albumin: 2.8 g/dL — ABNORMAL LOW (ref 3.5–5.2)
Alkaline Phosphatase: 65 U/L (ref 39–117)
BUN: 11 mg/dL (ref 6–23)
CO2: 22 meq/L (ref 19–32)
Calcium: 8.8 mg/dL (ref 8.4–10.5)
Chloride: 101 meq/L (ref 96–112)
Creatinine, Ser: 0.94 mg/dL (ref 0.50–1.35)
GFR calc Af Amer: >90 mL/min
GFR calc non Af Amer: >90 mL/min
Glucose, Bld: 97 mg/dL (ref 70–99)
Potassium: 3.7 meq/L (ref 3.5–5.1)
Sodium: 131 meq/L — ABNORMAL LOW (ref 135–145)
Total Bilirubin: 2.4 mg/dL — ABNORMAL HIGH (ref 0.3–1.2)
Total Protein: 6.6 g/dL (ref 6.0–8.3)

## 2013-11-22 LAB — CBC
HCT: 34.9 % — ABNORMAL LOW (ref 39.0–52.0)
Hemoglobin: 12.5 g/dL — ABNORMAL LOW (ref 13.0–17.0)
MCH: 33.7 pg (ref 26.0–34.0)
MCHC: 35.8 g/dL (ref 30.0–36.0)
MCV: 94.1 fL (ref 78.0–100.0)
RDW: 14.4 % (ref 11.5–15.5)
WBC: 5.1 10*3/uL (ref 4.0–10.5)

## 2013-11-22 MED ORDER — LACTULOSE 10 GM/15ML PO SOLN: 20.0000 g | Freq: Three times a day (TID) | ORAL | Status: DC

## 2013-11-22 MED ORDER — FLUOXETINE HCL 10 MG PO CAPS: 10.0000 mg | ORAL_CAPSULE | Freq: Every day | ORAL | Status: DC

## 2013-11-22 NOTE — Progress Notes (Signed)
Patient given all discharge instructions, with a verbalized understanding of instructions.  Patient to follow up with Vesta Mixer, and primary care physician.  Patient discharged in stable medical condition, and is agreeable to discharge plan.  Patient given bus pass for transportation home.  Philomena Doheny RN

## 2013-11-22 NOTE — Discharge Summary (Signed)
Physician Discharge Summary  Noah Medina ZHY:[id number] DOB: 1956-05-09 DOA: 11/17/2013  PCP: Iona Hansen, NP  Admit date: 11/17/2013 Discharge date: 11/22/2013  Time spent: 25 minutes  Recommendations for Outpatient Follow-up:  1. ETOH cessation counselled 2. Take meds as you have been instructed    Discharge Diagnoses:  Principal Problem:   Hepatic encephalopathy Active Problems:   Cirrhosis of liver with history of hepatitis C   Alcohol abuse   Suicidal ideation   Alcohol intoxication   Anasarca   Tobacco use disorder   Ventral hernia   Depression   Discharge Condition: Fair   Diet recommendation: Heart healthy  Filed Weights   11/18/13 0820  Weight: 95.255 kg (210 lb)    History of present illness:  Noah Medina is a 57 y.o. WM PMHx Cirrhosis due to Hepatitis C, ongoing ETOH Abuse, nicotine dependence, ventral hernia, syncope. who presents to the ED with complaints of "wanting to end it all" . He reports having sever depression due to his medical conditions and continued illness. He reports drinking heavily for the past 2 years since he got out of prison and He reports drinking 6x 40 ounce beers tonight, usually drinks 3x40 ounce beers per night. He was evaluated in the ED and was found to have an alcohol level of 260 and an NH3 level of 109. He was administer 1 dose of lactulose and placed on the ETOH withdrawal protocol and referred for medical admission. Patient was admitted at 0 514 on 11/18/2013 by [facility]. 11/18/2013 patient states doing well no longer has any suicidal or homicidal ideations, negative pain, negative N./V., negative CP, negative SOB. 11/19/2013 states negative N./V./D., negative SOB, negative CP, continues to have shakes but decreased from yesterday. States negative homicidal or suicidal ideations. Patient only required 4 mg Ativan per CIWA protocol in the last 24 hours 11/20/2013 has eaten all meals with negative N./V./D., negative SOB,  negative CP. Requested no when he can go home. 12/9 patient feeling greatly improved and would like to be discharged. Counseled patient that if he was still needing Ativan per our protocol he could not be discharged 12/10-much better.  Doesnl;t need Ativan, ready to go home.  NO plans to drink per him   Hospital Course:  alcohol abuse/alcohol intoxication;  -counseled patient on the seriousness of not allowing [place] to administer medication to prevent him from having DTs.  -Patient only required 3 mg Ativan per CIWA protocol overnight.  -Psychiatry recommended [facility] up with [facility] for cessation efforts -Unclear how compliant he will be on cessation advice -CIWA protocol 0 on am of d/c -UDS negative   Alcoholic encephalopathy  -Ammonia level now 79umol/L, patient still has some very mild asterixis  -Continue lactulose as OP   Ventral hernia  -Stable continue to monitor   suicidal/homicidal ideation;  -Psychiatry cleared patient   Depression  -Per psychiatry recommendations start Prozac 10 mg   Nicotine dependence  -Patient currently refusing nicotine patch, would like to go cold [place]    Consultants:  Psychiatry (Dr.Syed Arfeen) Procedures:  Antibiotics:   Discharge Exam: Filed Vitals:   11/22/13 0839  BP: 123/75  Pulse: 85  Temp: 98.5 F (36.9 C)  Resp: 16    General: EOMI, NCAT not tremulous Cardiovascular:  s1 s2 no m/r/g Respiratory: clear  Discharge Instructions  Discharge Orders   Future Orders Complete By Expires   Diet - low sodium heart healthy  As directed    Discharge instructions  As directed  Comments:     Please follow up with your Primary physician Please stop drinking Please take all your medications as scheudled NO Pain meds will be prescribed   Increase activity slowly  As directed        Medication List    STOP taking these medications       thiamine 100 MG tablet      TAKE these medications       FLUoxetine 10 MG capsule   Commonly known as:  PROZAC  Take 1 capsule (10 mg total) by mouth daily.     folic acid 1 MG tablet  Commonly known as:  FOLVITE  Take 1 tablet (1 mg total) by mouth daily.     multivitamin with minerals Tabs tablet  Take 1 tablet by mouth daily.     oxyCODONE 5 MG immediate release tablet  Commonly known as:  Oxy IR/ROXICODONE  Take 1 tablet (5 mg total) by mouth every 4 (four) hours as needed for pain.     propranolol 40 MG tablet  Commonly known as:  INDERAL  Take 40 mg by mouth 2 (two) times daily.     spironolactone 50 MG tablet  Commonly known as:  ALDACTONE  Take 50 mg by mouth 2 (two) times daily.       No Known Allergies     Follow-up Information   Follow up with [facility]. (Go to walk-in clinic Monday-Fridays between 8am-12:30pm for mental health services)    Contact information:   [address] [place], [place] [phone]      Follow up with Iona Hansen, NP. Schedule an appointment as soon as possible for a visit in 1 week.   Specialty:  Nurse Practitioner   Contact information:   [address] [place] [address] [phone]        The results of significant diagnostics from this hospitalization (including imaging, microbiology, ancillary and laboratory) are listed below for reference.    Significant Diagnostic Studies: Dg Abd Acute W/chest  11/18/2013   CLINICAL DATA:  Abdominal pain. History of cirrhosis and hepatitis-C.  EXAM: ACUTE ABDOMEN SERIES (ABDOMEN 2 VIEW & CHEST 1 VIEW)  COMPARISON:  09/09/2013 and 08/25/2013  and 08/19/2013  FINDINGS: Lungs are clear without focal airspace disease. Heart and mediastinum are within normal limits. Again noted are surgical clips in the mid upper abdomen. No evidence for free air. There is gas in the colon. Evidence for gallstones in the right upper abdomen.  IMPRESSION: Nonspecific bowel gas pattern.  Cholelithiasis.  No acute chest findings.   Electronically Signed   By: Richarda Overlie M.D.   On:  11/18/2013 00:49    Microbiology: Recent Results (from the past 240 hour(s))  MRSA PCR SCREENING     Status: None   Collection Time    11/18/13  1:52 PM      Result Value Range Status   MRSA by PCR NEGATIVE  NEGATIVE Final   Comment:            The GeneXpert MRSA Assay (FDA     approved for NASAL specimens     only), is one component of a     comprehensive MRSA colonization     surveillance program. It is not     intended to diagnose MRSA     infection nor to guide or     monitor treatment for     MRSA infections.     Labs: Basic Metabolic Panel:  Recent Labs Lab 11/18/13 0129 11/18/13 0838 11/20/13 0532 11/21/13 0600 11/22/13 0530  NA 143 143 138 135 131*  K 3.5 3.7 3.5 3.7 3.7  CL 109 110 107 104 101  CO2 26 22 23 22 22   GLUCOSE 106* 83 110* 104* 97  BUN 10 10 12 11 11   CREATININE 0.96 0.96 0.90 0.96 0.94  CALCIUM 8.9 8.2* 8.1* 8.3* 8.8  MG  --   --  1.6 1.5 1.5   Liver Function Tests:  Recent Labs Lab 11/18/13 0129 11/20/13 0532 11/21/13 0600 11/22/13 0530  AST 82* 63* 66* 69*  ALT 45 36 38 42  ALKPHOS 68 69 81 65  BILITOT 1.1 1.8* 1.8* 2.4*  PROT 6.5 6.0 6.3 6.6  ALBUMIN 2.8* 2.5* 2.7* 2.8*   No results found for this basename: LIPASE, AMYLASE,  in the last 168 hours  Recent Labs Lab 11/18/13 0129 11/19/13 0543 11/20/13 0532 11/21/13 0600 11/22/13 0530  AMMONIA 109* 70* 79* 56 65*   CBC:  Recent Labs Lab 11/18/13 0129 11/18/13 0838 11/20/13 0532 11/21/13 0600 11/22/13 0530  WBC 3.2* 2.0* 2.5* 3.9* 5.1  HGB 12.0* 11.7* 11.6* 11.8* 12.5*  HCT 33.4* 32.6* 32.3* 32.9* 34.9*  MCV 93.8 95.3 93.9 94.0 94.1  PLT 39* 38* 29* 30* 37*   Cardiac Enzymes: No results found for this basename: CKTOTAL, CKMB, CKMBINDEX, TROPONINI,  in the last 168 hours BNP: BNP (last 3 results) No results found for this basename: PROBNP,  in the last 8760 hours CBG: No results found for this basename: GLUCAP,  in the last 168  hours     Signed:  Rhetta Mura  [facility] 11/22/2013, 10:17 AM

## 2013-11-22 NOTE — Progress Notes (Addendum)
Clinical Social Work  Patient was discussed during progression meeting and it was determined that patient can DC today. CSW met with patient at bedside who reports he is happy to be leaving today. Patient reports no depression and denies any SI/HI or psychotic symptoms. Patient reports that he will follow up with [facility] and understanding of information placed on AVS. Patient understanding to call Mobile Crisis or 911 if in crisis in the future.   Patient was under IVC. MD signed Notice of Commitment Change form which was faxed to Magistrate and placed in chart for records. RN aware of DC plans.  CSW provided patient with bus pass to get home. CSW is signing off but available if further needs arise.  [place], [place] [phone]

## 2013-12-17 ENCOUNTER — Emergency Department (HOSPITAL_COMMUNITY)
Admission: EM | Admit: 2013-12-17 | Discharge: 2013-12-17 | Disposition: A | Discharge status: Home / Self Care | Payer: Medicaid Other | Attending: Emergency Medicine | Admitting: Emergency Medicine

## 2013-12-17 ENCOUNTER — Emergency Department (HOSPITAL_COMMUNITY): Payer: Medicaid Other

## 2013-12-17 ENCOUNTER — Encounter (HOSPITAL_COMMUNITY): Payer: Self-pay | Admitting: Emergency Medicine

## 2013-12-17 DIAGNOSIS — Z79899 Other long term (current) drug therapy: Secondary | ICD-10-CM | POA: Insufficient documentation

## 2013-12-17 DIAGNOSIS — Z8619 Personal history of other infectious and parasitic diseases: Secondary | ICD-10-CM | POA: Insufficient documentation

## 2013-12-17 DIAGNOSIS — S161XXA Strain of muscle, fascia and tendon at neck level, initial encounter: Secondary | ICD-10-CM

## 2013-12-17 DIAGNOSIS — Z8669 Personal history of other diseases of the nervous system and sense organs: Secondary | ICD-10-CM | POA: Insufficient documentation

## 2013-12-17 DIAGNOSIS — F17200 Nicotine dependence, unspecified, uncomplicated: Secondary | ICD-10-CM | POA: Insufficient documentation

## 2013-12-17 DIAGNOSIS — K219 Gastro-esophageal reflux disease without esophagitis: Secondary | ICD-10-CM | POA: Insufficient documentation

## 2013-12-17 DIAGNOSIS — S86912A Strain of unspecified muscle(s) and tendon(s) at lower leg level, left leg, initial encounter: Secondary | ICD-10-CM

## 2013-12-17 DIAGNOSIS — Z23 Encounter for immunization: Secondary | ICD-10-CM | POA: Insufficient documentation

## 2013-12-17 DIAGNOSIS — S0083XA Contusion of other part of head, initial encounter: Secondary | ICD-10-CM

## 2013-12-17 DIAGNOSIS — S139XXA Sprain of joints and ligaments of unspecified parts of neck, initial encounter: Secondary | ICD-10-CM | POA: Insufficient documentation

## 2013-12-17 DIAGNOSIS — IMO0002 Reserved for concepts with insufficient information to code with codable children: Secondary | ICD-10-CM | POA: Insufficient documentation

## 2013-12-17 DIAGNOSIS — S1093XA Contusion of unspecified part of neck, initial encounter: Secondary | ICD-10-CM

## 2013-12-17 DIAGNOSIS — S0003XA Contusion of scalp, initial encounter: Secondary | ICD-10-CM | POA: Insufficient documentation

## 2013-12-17 DIAGNOSIS — Z872 Personal history of diseases of the skin and subcutaneous tissue: Secondary | ICD-10-CM | POA: Insufficient documentation

## 2013-12-17 DIAGNOSIS — S61412A Laceration without foreign body of left hand, initial encounter: Secondary | ICD-10-CM

## 2013-12-17 DIAGNOSIS — R5383 Other fatigue: Secondary | ICD-10-CM

## 2013-12-17 DIAGNOSIS — S0990XA Unspecified injury of head, initial encounter: Secondary | ICD-10-CM

## 2013-12-17 DIAGNOSIS — S61409A Unspecified open wound of unspecified hand, initial encounter: Secondary | ICD-10-CM | POA: Insufficient documentation

## 2013-12-17 DIAGNOSIS — R5381 Other malaise: Secondary | ICD-10-CM | POA: Insufficient documentation

## 2013-12-17 MED ORDER — TETANUS-DIPHTH-ACELL PERTUSSIS 5-2.5-18.5 LF-MCG/0.5 IM SUSP
0.5000 mL | Freq: Once | INTRAMUSCULAR | Status: AC
  Administered 2013-12-17: 0.5 mL via INTRAMUSCULAR
  Filled 2013-12-17: qty 0.5

## 2013-12-17 MED ORDER — MELOXICAM 7.5 MG PO TABS: 7.5000 mg | ORAL_TABLET | Freq: Every day | ORAL | Status: DC
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
## 2013-12-17 NOTE — Discharge Instructions (Signed)
Cervical Sprain A cervical sprain is an injury in the neck in which the ligaments are stretched or torn. The ligaments are the tissues that hold the bones of the neck (vertebrae) in place.Cervical sprains can range from very mild to very severe. Most cervical sprains get better in 1 to 3 weeks, but it depends on the cause and extent of the injury. Severe cervical sprains can cause the neck vertebrae to be unstable. This can lead to damage of the spinal cord and can result in serious nervous system problems. Your caregiver will determine whether your cervical sprain is mild or severe. CAUSES  Severe cervical sprains may be caused by:  Contact sport injuries (football, rugby, wrestling, hockey, auto racing, gymnastics, diving, martial arts, boxing).  Motor vehicle collisions.  Whiplash injuries. This means the neck is forcefully whipped backward and forward.  Falls. Mild cervical sprains may be caused by:   Awkward positions, such as cradling a telephone between your ear and shoulder.  Sitting in a chair that does not offer proper support.  Working at a poorly Landscape architect station.  Activities that require looking up or down for long periods of time. SYMPTOMS   Pain, soreness, stiffness, or a burning sensation in the front, back, or sides of the neck. This discomfort may develop immediately after injury or it may develop slowly and not begin for 24 hours or more after an injury.  Pain or tenderness directly in the middle of the back of the neck.  Shoulder or upper back pain.  Limited ability to move the neck.  Headache.  Dizziness.  Weakness, numbness, or tingling in the hands or arms.  Muscle spasms.  Difficulty swallowing or chewing.  Tenderness and swelling of the neck. DIAGNOSIS  Most of the time, your caregiver can diagnose this problem by taking your history and doing a physical exam. Your caregiver will ask about any known problems, such as arthritis in the neck  or a previous neck injury. X-rays may be taken to find out if there are any other problems, such as problems with the bones of the neck. However, an X-ray often does not reveal the full extent of a cervical sprain. Other tests such as a computed tomography (CT) scan or magnetic resonance imaging (MRI) may be needed. TREATMENT  Treatment depends on the severity of the cervical sprain. Mild sprains can be treated with rest, keeping the neck in place (immobilization), and pain medicines. Severe cervical sprains need immediate immobilization and an appointment with an orthopedist or neurosurgeon. Several treatment options are available to help with pain, muscle spasms, and other symptoms. Your caregiver may prescribe:  Medicines, such as pain relievers, numbing medicines, or muscle relaxants.  Physical therapy. This can include stretching exercises, strengthening exercises, and posture training. Exercises and improved posture can help stabilize the neck, strengthen muscles, and help stop symptoms from returning.  A neck collar to be worn for short periods of time. Often, these collars are worn for comfort. However, certain collars may be worn to protect the neck and prevent further worsening of a serious cervical sprain. HOME CARE INSTRUCTIONS   Put ice on the injured area.  Put ice in a plastic bag.  Place a towel between your skin and the bag.  Leave the ice on for 15-20 minutes, 03-04 times a day.  Only take over-the-counter or prescription medicines for pain, discomfort, or fever as directed by your caregiver.  Keep all follow-up appointments as directed by your caregiver.  Keep all  physical therapy appointments as directed by your caregiver.  If a neck collar is prescribed, wear it as directed by your caregiver.  Do not drive while wearing a neck collar.  Make any needed adjustments to your work station to promote good posture.  Avoid positions and activities that make your symptoms  worse.  Warm up and stretch before being active to help prevent problems. SEEK MEDICAL CARE IF:   Your pain is not controlled with medicine.  You are unable to decrease your pain medicine over time as planned.  Your activity level is not improving as expected. SEEK IMMEDIATE MEDICAL CARE IF:   You develop any bleeding, stomach upset, or signs of an allergic reaction to your medicine.  Your symptoms get worse.  You develop new, unexplained symptoms.  You have numbness, tingling, weakness, or paralysis in any part of your body. MAKE SURE YOU:   Understand these instructions.  Will watch your condition.  Will get help right away if you are not doing well or get worse. Document Released: 09/27/2007 Document Revised: 02/22/2012 Document Reviewed: 06/07/2013 [facility] Patient Information 2014 [place].  Concussion, Adult A concussion, or closed-head injury, is a brain injury caused by a direct blow to the head or by a quick and sudden movement (jolt) of the head or neck. Concussions are usually not life-threatening. Even so, the effects of a concussion can be serious. If you have had a concussion before, you are more likely to experience concussion-like symptoms after a direct blow to the head.  CAUSES   Direct blow to the head, such as from running into another player during a soccer game, being hit in a fight, or hitting your head on a hard surface.  A jolt of the head or neck that causes the brain to move back and forth inside the skull, such as in a car crash. SIGNS AND SYMPTOMS  The signs of a concussion can be hard to notice. Early on, they may be missed by you, family members, and health care providers. You may look fine but act or feel differently. Symptoms are usually temporary, but they may last for days, weeks, or even longer. Some symptoms may appear right away while others may not show up for hours or days. Every head injury is different. Symptoms include:   Mild  to moderate headaches that will not go away.  A feeling of pressure inside your head.  Having more trouble than usual:   Learning or remembering things you have heard.  Answering questions.  Paying attention or concentrating.   Organizing daily tasks.   Making decisions and solving problems.   Slowness in thinking, acting or reacting, speaking, or reading.   Getting lost or being easily confused.   Feeling tired all the time or lacking energy (fatigued).   Feeling drowsy.   Sleep disturbances.   Sleeping more than usual.   Sleeping less than usual.   Trouble falling asleep.   Trouble sleeping (insomnia).   Loss of balance or feeling lightheaded or dizzy.   Nausea or vomiting.   Numbness or tingling.   Increased sensitivity to:   Sounds.   Lights.   Distractions.   Vision problems or eyes that tire easily.   Diminished sense of taste or smell.   Ringing in the ears.   Mood changes such as feeling sad or anxious.   Becoming easily irritated or angry for little or no reason.   Lack of motivation.  Seeing or hearing things other  people do not see or hear (hallucinations). DIAGNOSIS  Your health care provider can usually diagnose a concussion based on a description of your injury and symptoms. He or she will ask whether you passed out (lost consciousness) and whether you are having trouble remembering events that happened right before and during your injury.  Your evaluation might include:   A brain scan to look for signs of injury to the brain. Even if the test shows no injury, you may still have a concussion.   Blood tests to be sure other problems are not present. TREATMENT   Concussions are usually treated in an emergency department, in urgent care, or at a clinic. You may need to stay in the hospital overnight for further treatment.   Tell your health care provider if you are taking any medicines, including  prescription medicines, over-the-counter medicines, and natural remedies. Some medicines, such as blood thinners (anticoagulants) and aspirin, may increase the chance of complications. Also tell your health care provider whether you have had alcohol or are taking illegal drugs. This information may affect treatment.  Your health care provider will send you home with important instructions to follow.  How fast you will recover from a concussion depends on many factors. These factors include how severe your concussion is, what part of your brain was injured, your age, and how healthy you were before the concussion.  Most people with mild injuries recover fully. Recovery can take time. In general, recovery is slower in older persons. Also, persons who have had a concussion in the past or have other medical problems may find that it takes longer to recover from their current injury. HOME CARE INSTRUCTIONS  General Instructions  Carefully follow the directions your health care provider gave you.  Only take over-the-counter or prescription medicines for pain, discomfort, or fever as directed by your health care provider.  Take only those medicines that your health care provider has approved.  Do not drink alcohol until your health care provider says you are well enough to do so. Alcohol and certain other drugs may slow your recovery and can put you at risk of further injury.  If it is harder than usual to remember things, write them down.  If you are easily distracted, try to do one thing at a time. For example, do not try to watch TV while fixing dinner.  Talk with family members or close friends when making important decisions.  Keep all follow-up appointments. Repeated evaluation of your symptoms is recommended for your recovery.  Watch your symptoms and tell others to do the same. Complications sometimes occur after a concussion. Older adults with a brain injury may have a higher risk of  serious complications such as of a blood clot on the brain.  Tell your teachers, school nurse, school counselor, coach, athletic trainer, or work Freight forwarder about your injury, symptoms, and restrictions. Tell them about what you can or cannot do. They should watch for:   Increased problems with attention or concentration.   Increased difficulty remembering or learning new information.   Increased time needed to complete tasks or assignments.   Increased irritability or decreased ability to cope with stress.   Increased symptoms.   Rest. Rest helps the brain to heal. Make sure you:  Get plenty of sleep at night. Avoid staying up late at night.  Keep the same bedtime hours on weekends and weekdays.  Rest during the day. Take daytime naps or rest breaks when you feel tired.  Limit activities that require a lot of thought or concentration. These includes   Doing homework or job-related work.   Watching TV.   Working on the computer.  Avoid any situation where there is potential for another head injury (football, hockey, soccer, basketball, martial arts, downhill snow sports and horseback riding). Your condition will get worse every time you experience a concussion. You should avoid these activities until you are evaluated by the appropriate follow-up caregivers. Returning To Your Regular Activities You will need to return to your normal activities slowly, not all at once. You must give your body and brain enough time for recovery.  Do not return to sports or other athletic activities until your health care provider tells you it is safe to do so.  Ask your health care provider when you can drive, ride a bicycle, or operate heavy machinery. Your ability to react may be slower after a brain injury. Never do these activities if you are dizzy.  Ask your health care provider about when you can return to work or school. Preventing Another Concussion It is very important to avoid  another brain injury, especially before you have recovered. In rare cases, another injury can lead to permanent brain damage, brain swelling, or death. The risk of this is greatest during the first 7 10 days after a head injury. Avoid injuries by:   Wearing a seat belt when riding in a car.   Drinking alcohol only in moderation.   Wearing a helmet when biking, skiing, skateboarding, skating, or doing similar activities.  Avoiding activities that could lead to a second concussion, such as contact or recreational sports, until your health care provider says it is OK.  Taking safety measures in your home.   Remove clutter and tripping hazards from floors and stairways.   Use grab bars in bathrooms and handrails by stairs.   Place non-slip mats on floors and in bathtubs.   Improve lighting in dim areas. SEEK MEDICAL CARE IF:   You have increased problems paying attention or concentrating.   You have increased difficulty remembering or learning new information.   You need more time to complete tasks or assignments than before.   You have increased irritability or decreased ability to cope with stress.  You have more symptoms than before. Seek medical care if you have any of the following symptoms for more than 2 weeks after your injury:   Lasting (chronic) headaches.   Dizziness or balance problems.   Nausea.  Vision problems.   Increased sensitivity to noise or light.   Depression or mood swings.   Anxiety or irritability.   Memory problems.   Difficulty concentrating or paying attention.   Sleep problems.   Feeling tired all the time. SEEK IMMEDIATE MEDICAL CARE IF:   You have severe or worsening headaches. These may be a sign of a blood clot in the brain.  You have weakness (even if only in one hand, leg, or part of the face).  You have numbness.  You have decreased coordination.   You vomit repeatedly.  You have increased  sleepiness.  One pupil is larger than the other.   You have convulsions.   You have slurred speech.   You have increased confusion. This may be a sign of a blood clot in the brain.  You have increased restlessness, agitation, or irritability.   You are unable to recognize people or places.   You have neck pain.   It is difficult to  wake you up.   You have unusual behavior changes.   You lose consciousness. MAKE SURE YOU:   Understand these instructions.  Will watch your condition.  Will get help right away if you are not doing well or get worse. Document Released: 02/20/2004 Document Revised: 08/02/2013 Document Reviewed: 06/22/2013 [facility] Patient Information 2014 [place], [place].  Delayed Wound Closure Sometimes, your health care provider will decide to delay closing a wound for several days. This is done when the wound is badly bruised, dirty, or when it has been several hours since the injury happened. By delaying the closure of your wound, the risk of infection is reduced. Wounds that are closed in 3 7 days after being cleaned up and dressed heal just as well as those that are closed right away. HOME CARE INSTRUCTIONS  Rest and elevate the injured area until the pain and swelling are gone.  Have your wound checked as instructed by your health care provider. SEEK MEDICAL CARE IF:  You develop unusual or increased swelling or redness around the wound.  You have increasing pain or tenderness.  There is increasing fluid (drainage) or a bad smelling drainage coming from the wound. Document Released: 11/30/2005 Document Revised: 08/02/2013 Document Reviewed: 05/30/2013 [facility] Patient Information 2014 [place], [place].  Knee Sprain A knee sprain is a tear in one of the strong, fibrous tissues that connect the bones (ligaments) in your knee. The severity of the sprain depends on how much of the ligament is torn. The tear can be either partial or  complete. CAUSES  Often, sprains are a result of a fall or injury. The force of the impact causes the fibers of your ligament to stretch too much. This excess tension causes the fibers of your ligament to tear. SYMPTOMS  You may have some loss of motion in your knee. Other symptoms include:  Bruising.  Tenderness.  Swelling. DIAGNOSIS  In order to diagnose knee sprain, your caregiver will physically examine your knee to determine how torn the ligament is. Your caregiver may also suggest an X-ray exam of your knee to make sure no bones are broken. TREATMENT  If your ligament is only partially torn, treatment usually involves keeping the knee in a fixed position (immobilization) or bracing your knee for activities that require movement for several weeks. To do this, your caregiver will apply a bandage, cast, or splint to keep your knee from moving or support your knee during movement until it heals. For a partially torn ligament, the healing process usually takes 4 to 6 weeks. If your ligament is completely torn, depending on which ligament it is, you may need surgery to reconnect the ligament to the bone or reconstruct it. After surgery, a cast or splint may be applied and will need to stay on your knee for 4 to 6 weeks while your ligament heals. HOME CARE INSTRUCTIONS  Keep your injured knee elevated to decrease swelling.  To ease pain and swelling, apply ice to your knee twice a day, for 2 to 3 days:  Put ice in a plastic bag.  Place a towel between your skin and the bag.  Leave the ice on for 15 minutes.  Only take over-the-counter or prescription medicine for pain as directed by your caregiver.  Do not leave your knee unprotected until pain and stiffness go away (usually 4 to 6 weeks).  Do not allow your cast or splint to get wet. If you have been instructed not to remove it, cover your cast  or splint with a plastic bag when you shower or bathe. Do not swim.  Your caregiver may  suggest exercises for you to do during your recovery to prevent or limit permanent weakness and stiffness. SEEK IMMEDIATE MEDICAL CARE IF:  Your cast or splint becomes damaged.  Your pain becomes worse. MAKE SURE YOU:  Understand these instructions.  Will watch your condition.  Will get help right away if you are not doing well or get worse. Document Released: 11/30/2005 Document Revised: 02/22/2012 Document Reviewed: 07/12/2013 [facility] Patient Information 2014 [place].
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
## 2013-12-30 NOTE — ED Provider Notes (Signed)
Medical screening examination/treatment/procedure(s) were performed by non-physician practitioner and as supervising physician I was immediately available for consultation/collaboration.  EKG Interpretation   None         Skai Lickteig, MD 12/30/13 0650 

## 2014-01-31 ENCOUNTER — Encounter (HOSPITAL_COMMUNITY): Payer: Self-pay | Admitting: Emergency Medicine

## 2014-01-31 ENCOUNTER — Inpatient Hospital Stay (HOSPITAL_COMMUNITY)
Admission: RE | Admit: 2014-01-31 | Discharge: 2014-02-03 | DRG: 389 | Disposition: A | Discharge status: Home / Self Care | Payer: Medicaid Other | Attending: Internal Medicine | Admitting: Internal Medicine

## 2014-01-31 ENCOUNTER — Emergency Department (HOSPITAL_COMMUNITY): Payer: Medicaid Other

## 2014-01-31 DIAGNOSIS — K56609 Unspecified intestinal obstruction, unspecified as to partial versus complete obstruction: Secondary | ICD-10-CM

## 2014-01-31 DIAGNOSIS — Z8711 Personal history of peptic ulcer disease: Secondary | ICD-10-CM

## 2014-01-31 DIAGNOSIS — Z9889 Other specified postprocedural states: Secondary | ICD-10-CM

## 2014-01-31 DIAGNOSIS — F329 Major depressive disorder, single episode, unspecified: Secondary | ICD-10-CM

## 2014-01-31 DIAGNOSIS — K7290 Hepatic failure, unspecified without coma: Secondary | ICD-10-CM

## 2014-01-31 DIAGNOSIS — E871 Hypo-osmolality and hyponatremia: Secondary | ICD-10-CM

## 2014-01-31 DIAGNOSIS — F1020 Alcohol dependence, uncomplicated: Secondary | ICD-10-CM | POA: Diagnosis present

## 2014-01-31 DIAGNOSIS — D649 Anemia, unspecified: Secondary | ICD-10-CM

## 2014-01-31 DIAGNOSIS — K56600 Partial intestinal obstruction, unspecified as to cause: Secondary | ICD-10-CM | POA: Diagnosis present

## 2014-01-31 DIAGNOSIS — Z8719 Personal history of other diseases of the digestive system: Secondary | ICD-10-CM

## 2014-01-31 DIAGNOSIS — E43 Unspecified severe protein-calorie malnutrition: Secondary | ICD-10-CM

## 2014-01-31 DIAGNOSIS — D61818 Other pancytopenia: Secondary | ICD-10-CM | POA: Diagnosis present

## 2014-01-31 DIAGNOSIS — K439 Ventral hernia without obstruction or gangrene: Secondary | ICD-10-CM

## 2014-01-31 DIAGNOSIS — R740 Nonspecific elevation of levels of transaminase and lactic acid dehydrogenase [LDH]: Secondary | ICD-10-CM

## 2014-01-31 DIAGNOSIS — R109 Unspecified abdominal pain: Secondary | ICD-10-CM

## 2014-01-31 DIAGNOSIS — R601 Generalized edema: Secondary | ICD-10-CM

## 2014-01-31 DIAGNOSIS — D696 Thrombocytopenia, unspecified: Secondary | ICD-10-CM

## 2014-01-31 DIAGNOSIS — R55 Syncope and collapse: Secondary | ICD-10-CM

## 2014-01-31 DIAGNOSIS — E876 Hypokalemia: Secondary | ICD-10-CM

## 2014-01-31 DIAGNOSIS — R112 Nausea with vomiting, unspecified: Secondary | ICD-10-CM

## 2014-01-31 DIAGNOSIS — F32A Depression, unspecified: Secondary | ICD-10-CM

## 2014-01-31 DIAGNOSIS — K7460 Unspecified cirrhosis of liver: Secondary | ICD-10-CM | POA: Diagnosis present

## 2014-01-31 DIAGNOSIS — B1920 Unspecified viral hepatitis C without hepatic coma: Secondary | ICD-10-CM | POA: Diagnosis present

## 2014-01-31 DIAGNOSIS — S065XAA Traumatic subdural hemorrhage with loss of consciousness status unknown, initial encounter: Secondary | ICD-10-CM

## 2014-01-31 DIAGNOSIS — K7682 Hepatic encephalopathy: Secondary | ICD-10-CM

## 2014-01-31 DIAGNOSIS — K7030 Alcoholic cirrhosis of liver without ascites: Secondary | ICD-10-CM | POA: Diagnosis present

## 2014-01-31 DIAGNOSIS — F1010 Alcohol abuse, uncomplicated: Secondary | ICD-10-CM

## 2014-01-31 DIAGNOSIS — F10929 Alcohol use, unspecified with intoxication, unspecified: Secondary | ICD-10-CM

## 2014-01-31 DIAGNOSIS — G252 Other specified forms of tremor: Secondary | ICD-10-CM

## 2014-01-31 DIAGNOSIS — D759 Disease of blood and blood-forming organs, unspecified: Secondary | ICD-10-CM | POA: Diagnosis present

## 2014-01-31 DIAGNOSIS — Z79899 Other long term (current) drug therapy: Secondary | ICD-10-CM

## 2014-01-31 DIAGNOSIS — S065X9A Traumatic subdural hemorrhage with loss of consciousness of unspecified duration, initial encounter: Secondary | ICD-10-CM

## 2014-01-31 DIAGNOSIS — W19XXXA Unspecified fall, initial encounter: Secondary | ICD-10-CM

## 2014-01-31 DIAGNOSIS — Z791 Long term (current) use of non-steroidal anti-inflammatories (NSAID): Secondary | ICD-10-CM

## 2014-01-31 DIAGNOSIS — R7401 Elevation of levels of liver transaminase levels: Secondary | ICD-10-CM

## 2014-01-31 DIAGNOSIS — K219 Gastro-esophageal reflux disease without esophagitis: Secondary | ICD-10-CM

## 2014-01-31 DIAGNOSIS — F17200 Nicotine dependence, unspecified, uncomplicated: Secondary | ICD-10-CM

## 2014-01-31 DIAGNOSIS — IMO0002 Reserved for concepts with insufficient information to code with codable children: Secondary | ICD-10-CM

## 2014-01-31 DIAGNOSIS — G250 Essential tremor: Secondary | ICD-10-CM | POA: Diagnosis present

## 2014-01-31 DIAGNOSIS — Z22322 Carrier or suspected carrier of Methicillin resistant Staphylococcus aureus: Secondary | ICD-10-CM

## 2014-01-31 DIAGNOSIS — R188 Other ascites: Secondary | ICD-10-CM

## 2014-01-31 DIAGNOSIS — R45851 Suicidal ideations: Secondary | ICD-10-CM

## 2014-01-31 LAB — COMPREHENSIVE METABOLIC PANEL
ALT: 31 U/L (ref 0–53)
ALT: 27 U/L (ref 0–53)
AST: 52 U/L — ABNORMAL HIGH (ref 0–37)
AST: 44 U/L — ABNORMAL HIGH (ref 0–37)
Albumin: 3 g/dL — ABNORMAL LOW (ref 3.5–5.2)
Albumin: 2.6 g/dL — ABNORMAL LOW (ref 3.5–5.2)
Alkaline Phosphatase: 79 U/L (ref 39–117)
Alkaline Phosphatase: 66 U/L (ref 39–117)
BILIRUBIN TOTAL: 1.1 mg/dL (ref 0.3–1.2)
BILIRUBIN TOTAL: 0.9 mg/dL (ref 0.3–1.2)
BUN: 15 mg/dL (ref 6–23)
BUN: 12 mg/dL (ref 6–23)
CHLORIDE: 105 meq/L (ref 96–112)
CO2: 24 meq/L (ref 19–32)
CO2: 24 mEq/L (ref 19–32)
CREATININE: 1.01 mg/dL (ref 0.50–1.35)
CREATININE: 0.79 mg/dL (ref 0.50–1.35)
Calcium: 8.4 mg/dL (ref 8.4–10.5)
Calcium: 8.1 mg/dL — ABNORMAL LOW (ref 8.4–10.5)
Chloride: 103 mEq/L (ref 96–112)
GFR calc Af Amer: >90 mL/min (ref >90)
GFR calc Af Amer: >90 mL/min (ref >90)
GFR, EST NON AFRICAN AMERICAN: 80 mL/min — AB (ref >90)
GLUCOSE: 98 mg/dL (ref 70–99)
Glucose, Bld: 143 mg/dL — ABNORMAL HIGH (ref 70–99)
Potassium: 3.9 mEq/L (ref 3.7–5.3)
Potassium: 3.8 mEq/L (ref 3.7–5.3)
SODIUM: 138 meq/L (ref 137–147)
Sodium: 137 mEq/L (ref 137–147)
Total Protein: 6.8 g/dL (ref 6.0–8.3)
Total Protein: 6.2 g/dL (ref 6.0–8.3)

## 2014-01-31 LAB — CBC WITH DIFFERENTIAL/PLATELET
BASOS ABS: 0 10*3/uL (ref 0.0–0.1)
Basophils Absolute: 0 10*3/uL (ref 0.0–0.1)
Basophils Relative: 0 % (ref 0–1)
Basophils Relative: 0 % (ref 0–1)
EOS ABS: 0.1 10*3/uL (ref 0.0–0.7)
Eosinophils Absolute: 0.1 10*3/uL (ref 0.0–0.7)
Eosinophils Relative: 2 % (ref 0–5)
Eosinophils Relative: 2 % (ref 0–5)
HCT: 34.5 % — ABNORMAL LOW (ref 39.0–52.0)
HEMATOCRIT: 36.1 % — AB (ref 39.0–52.0)
HEMOGLOBIN: 12 g/dL — AB (ref 13.0–17.0)
Hemoglobin: 12.3 g/dL — ABNORMAL LOW (ref 13.0–17.0)
LYMPHS PCT: 14 % (ref 12–46)
Lymphocytes Relative: 11 % — ABNORMAL LOW (ref 12–46)
Lymphs Abs: 0.7 10*3/uL (ref 0.7–4.0)
Lymphs Abs: 0.6 10*3/uL — ABNORMAL LOW (ref 0.7–4.0)
MCH: 33.3 pg (ref 26.0–34.0)
MCH: 34.1 pg — ABNORMAL HIGH (ref 26.0–34.0)
MCHC: 34.1 g/dL (ref 30.0–36.0)
MCHC: 34.8 g/dL (ref 30.0–36.0)
MCV: 97.8 fL (ref 78.0–100.0)
MCV: 98 fL (ref 78.0–100.0)
MONOS PCT: 9 % (ref 3–12)
Monocytes Absolute: 0.5 10*3/uL (ref 0.1–1.0)
Monocytes Absolute: 0.5 10*3/uL (ref 0.1–1.0)
Monocytes Relative: 10 % (ref 3–12)
NEUTROS PCT: 76 % (ref 43–77)
Neutro Abs: 3.8 10*3/uL (ref 1.7–7.7)
Neutro Abs: 4 10*3/uL (ref 1.7–7.7)
Neutrophils Relative %: 75 % (ref 43–77)
PLATELETS: 59 10*3/uL — AB (ref 150–400)
Platelets: 63 10*3/uL — ABNORMAL LOW (ref 150–400)
RBC: 3.69 MIL/uL — AB (ref 4.22–5.81)
RBC: 3.52 MIL/uL — ABNORMAL LOW (ref 4.22–5.81)
RDW: 14.7 % (ref 11.5–15.5)
RDW: 14.6 % (ref 11.5–15.5)
WBC: 5.1 10*3/uL (ref 4.0–10.5)
WBC: 5.3 10*3/uL (ref 4.0–10.5)

## 2014-01-31 LAB — GLUCOSE, CAPILLARY
GLUCOSE-CAPILLARY: 93 mg/dL (ref 70–99)
GLUCOSE-CAPILLARY: 76 mg/dL (ref 70–99)
GLUCOSE-CAPILLARY: 87 mg/dL (ref 70–99)

## 2014-01-31 LAB — PROTIME-INR
INR: 1.33 (ref 0.00–1.49)
Prothrombin Time: 16.2 seconds — ABNORMAL HIGH (ref 11.6–15.2)

## 2014-01-31 LAB — URINALYSIS, ROUTINE W REFLEX MICROSCOPIC
BILIRUBIN URINE: NEGATIVE
GLUCOSE, UA: NEGATIVE mg/dL
KETONES UR: NEGATIVE mg/dL
Leukocytes, UA: NEGATIVE
Nitrite: NEGATIVE
PROTEIN: NEGATIVE mg/dL
Specific Gravity, Urine: 1.025 (ref 1.005–1.030)
Urobilinogen, UA: 1 mg/dL (ref 0.0–1.0)
pH: 6 (ref 5.0–8.0)

## 2014-01-31 LAB — RAPID URINE DRUG SCREEN, HOSP PERFORMED
AMPHETAMINES: NOT DETECTED
Barbiturates: NOT DETECTED
Benzodiazepines: NOT DETECTED
COCAINE: NOT DETECTED
OPIATES: NOT DETECTED
Tetrahydrocannabinol: NOT DETECTED

## 2014-01-31 LAB — TYPE AND SCREEN
ABO/RH(D): O POS
ANTIBODY SCREEN: NEGATIVE

## 2014-01-31 LAB — URINE MICROSCOPIC-ADD ON

## 2014-01-31 LAB — LIPASE, BLOOD: Lipase: 48 U/L (ref 11–59)

## 2014-01-31 MED ORDER — ONDANSETRON HCL 4 MG PO TABS: 4.0000 mg | ORAL_TABLET | Freq: Four times a day (QID) | ORAL | Status: DC | PRN

## 2014-01-31 MED ORDER — ACETAMINOPHEN 325 MG PO TABS: 650.0000 mg | ORAL_TABLET | Freq: Four times a day (QID) | ORAL | Status: DC | PRN

## 2014-01-31 MED ORDER — PANTOPRAZOLE SODIUM 40 MG IV SOLR
40.0000 mg | Freq: Every day | INTRAVENOUS | Status: DC
  Administered 2014-01-31 – 2014-02-02 (×3): 40 mg via INTRAVENOUS
  Filled 2014-01-31 (×4): qty 40

## 2014-01-31 MED ORDER — ONDANSETRON HCL 4 MG/2ML IJ SOLN: 4.0000 mg | Freq: Three times a day (TID) | INTRAMUSCULAR | Status: AC | PRN

## 2014-01-31 MED ORDER — IOHEXOL 300 MG/ML  SOLN
100.0000 mL | Freq: Once | INTRAMUSCULAR | Status: AC | PRN
  Administered 2014-01-31: 100 mL via INTRAVENOUS

## 2014-01-31 MED ORDER — HYDROMORPHONE HCL PF 1 MG/ML IJ SOLN
1.0000 mg | INTRAMUSCULAR | Status: AC | PRN
  Administered 2014-01-31 (×2): 1 mg via INTRAVENOUS
  Filled 2014-01-31 (×4): qty 1

## 2014-01-31 MED ORDER — HYDROMORPHONE HCL PF 1 MG/ML IJ SOLN
1.0000 mg | Freq: Once | INTRAMUSCULAR | Status: AC
  Administered 2014-01-31: 1 mg via INTRAVENOUS
  Filled 2014-01-31: qty 1

## 2014-01-31 MED ORDER — SODIUM CHLORIDE 0.9 % IV BOLUS (SEPSIS)
1000.0000 mL | Freq: Once | INTRAVENOUS | Status: AC
  Administered 2014-01-31: 1000 mL via INTRAVENOUS

## 2014-01-31 MED ORDER — HYDROMORPHONE HCL PF 1 MG/ML IJ SOLN
1.0000 mg | INTRAMUSCULAR | Status: DC | PRN
  Administered 2014-01-31 – 2014-02-03 (×16): 1 mg via INTRAVENOUS
  Filled 2014-01-31 (×14): qty 1

## 2014-01-31 MED ORDER — ONDANSETRON HCL 4 MG/2ML IJ SOLN
4.0000 mg | Freq: Four times a day (QID) | INTRAMUSCULAR | Status: DC | PRN
  Administered 2014-01-31: 4 mg via INTRAVENOUS
  Filled 2014-01-31: qty 2

## 2014-01-31 MED ORDER — SODIUM CHLORIDE 0.9 % IV SOLN
INTRAVENOUS | Status: AC
  Administered 2014-01-31: 50 mL via INTRAVENOUS
  Administered 2014-02-01: 10:00:00 via INTRAVENOUS

## 2014-01-31 MED ORDER — IOHEXOL 300 MG/ML  SOLN
50.0000 mL | Freq: Once | INTRAMUSCULAR | Status: AC | PRN
  Administered 2014-01-31: 50 mL via ORAL

## 2014-01-31 MED ORDER — ONDANSETRON HCL 4 MG/2ML IJ SOLN
4.0000 mg | Freq: Once | INTRAMUSCULAR | Status: AC
  Administered 2014-01-31: 4 mg via INTRAVENOUS
  Filled 2014-01-31: qty 2

## 2014-01-31 MED ORDER — ACETAMINOPHEN 650 MG RE SUPP: 650.0000 mg | Freq: Four times a day (QID) | RECTAL | Status: DC | PRN

## 2014-01-31 MED ORDER — THIAMINE HCL 100 MG/ML IJ SOLN
100.0000 mg | Freq: Every day | INTRAMUSCULAR | Status: DC
  Administered 2014-01-31 – 2014-02-03 (×4): 100 mg via INTRAVENOUS
  Filled 2014-01-31 (×4): qty 1

## 2014-01-31 NOTE — ED Notes (Signed)
Patient presents today via EMS with a chief complaint of abdominal pain, nausea, vomiting x 3 hours. Patient has abdominal history; EMS reports 1 episode of vomiting en route. Patient reports last bowel movement was this AM.

## 2014-01-31 NOTE — Consult Note (Signed)
ATTENDING ADDENDUM:  I personally reviewed patient's record, examined the patient, and formulated the following assessment and plan:  Agree with PA note.  Pt is a poor surgical candidate.  Hernia does not seem to be the cause of obstruction and is soft and reducible.  Suspect adhesive disease.

## 2014-01-31 NOTE — ED Notes (Signed)
Attempted to call report to floor. Nurse is unavailable at this time, call back information left with Network engineer.

## 2014-01-31 NOTE — ED Provider Notes (Signed)
CSN: [id number]     Arrival date & time 01/31/14  0052 History   First MD Initiated Contact with Patient 01/31/14 0102     Chief Complaint  Patient presents with  . Emesis  . Abdominal Pain     (Consider location/radiation/quality/duration/timing/severity/associated sxs/prior Treatment) HPI History provided by pt.   Pt has a h/o cirrhosis and Hep C secondary to IV drug abuse, bleeding peptic ulcers and incisional hernia.  Has had several abdominal surgeries.  Presents to ED today w/ severe, non-radiating, cramping abdominal pain that woke him from sleep at 9:30pm.  Felt well before going to bed.  Pain diffuse but worst at LLQ.  Associated w/ vomiting.  Denies fever, hematemesis/hematochezia/melena, diarrhea, urinary sx, testicular pain.  Most recent BM yesterday evening and nml.  Has never experienced pain like this in the past.   Past Medical History  Diagnosis Date  . Cirrhosis of liver   . Hepatitis C 03-28-12    ? 80's-prev. IV drug abuse  . 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 Blood transfusion 03-28-12    '81-hx. of bleeding ulcer  . History of bleeding ulcers 03-28-12    Hx. '81  . GERD (gastroesophageal reflux disease) 03-26-12    Hx. reflux. ulcers in past, occ. OTC med  . Incisional hernia     has drainage system from abdomin  . Subdural hematoma    Past Surgical History  Procedure Laterality Date  . Umbilical hernia repair    . Hernia repair  08/24/11    ventral hernia with mesh  . Hernia repair  38'18    Umbilical  . Peptic ulcer  03-26-12    open peptic ulcer surgery repair- '81  . Incisional hernia repair  03/26/2012  . Incisional hernia repair  03/30/2012    Procedure: LAPAROSCOPIC INCISIONAL HERNIA;  Surgeon: Harl Bowie, MD;  Location: [facility];  Service: General;  Laterality: N/A;  Laparoscopic Incisional/ventral Hernia Repair with Mesh  . Tonsillectomy    . Appendectomy    . Incisional hernia repair  07/20/2012    mesh  .  Incisional hernia repair  07/20/2012    Procedure: HERNIA REPAIR INCISIONAL;  Surgeon: Harl Bowie, MD;  Location: [facility];  Service: General;  Laterality: N/A;  repair of incisional hernia with Biologic mesh   Family History  Problem Relation Age of Onset  . Cancer Mother     breast  . Cancer Father     brain  . Heart disease Brother   . Cancer Sister     breast  . Cancer Sister     breast   History  Substance Use Topics  . Smoking status: Current Every Day Smoker -- 0.25 packs/day for 38 years    Types: Cigarettes  . Smokeless tobacco: Never Used  . Alcohol Use: 21.0 oz/week    35 Cans of beer per week     Comment: 6 beers a week per pt    Review of Systems  All other systems reviewed and are negative.      Allergies  Review of patient's allergies indicates no known allergies.  Home Medications   Current Outpatient Rx  Name  Route  Sig  Dispense  Refill  . ibuprofen (ADVIL,MOTRIN) 200 MG tablet   Oral   Take 400 mg by mouth every 6 (six) hours as needed.         . Multiple Vitamin (MULITIVITAMIN WITH MINERALS) TABS   Oral   Take  1 tablet by mouth daily.         Marland Kitchen spironolactone (ALDACTONE) 50 MG tablet   Oral   Take 50 mg by mouth 2 (two) times daily.          BP 148/62  Pulse 78  Temp(Src) 98.3 F (36.8 C) (Oral)  Resp 20  SpO2 97% Physical Exam  Nursing note and vitals reviewed. Constitutional: He is oriented to person, place, and time. He appears well-developed and well-nourished. No distress.  Uncomfortable appearing  HENT:  Head: Normocephalic and atraumatic.  Eyes:  Normal appearance  Neck: Normal range of motion.  Cardiovascular: Normal rate and regular rhythm.   Pulmonary/Chest: Effort normal and breath sounds normal. No respiratory distress.  Abdominal: Soft. Bowel sounds are normal. He exhibits no distension and no mass. There is no rebound and no guarding.  Several surgical scars.  Mid-line hernias.  Diffuse ttp w/ guarding,  worst on L side.   Genitourinary:  No CVA tenderness  Musculoskeletal: Normal range of motion.  Neurological: He is alert and oriented to person, place, and time.  Skin: Skin is warm and dry. No rash noted.  Psychiatric: He has a normal mood and affect. His behavior is normal.    ED Course  Procedures (including critical care time) Labs Review Labs Reviewed  CBC WITH DIFFERENTIAL - Abnormal; Notable for the following:    RBC 3.69 (*)    Hemoglobin 12.3 (*)    HCT 36.1 (*)    Platelets 63 (*)    All other components within normal limits  COMPREHENSIVE METABOLIC PANEL - Abnormal; Notable for the following:    Glucose, Bld 143 (*)    Albumin 3.0 (*)    AST 52 (*)    GFR calc non Af Amer 80 (*)    All other components within normal limits  URINALYSIS, ROUTINE W REFLEX MICROSCOPIC - Abnormal; Notable for the following:    Color, Urine AMBER (*)    Hgb urine dipstick SMALL (*)    All other components within normal limits  LIPASE, BLOOD  URINE MICROSCOPIC-ADD ON  URINE RAPID DRUG SCREEN (HOSP PERFORMED)   Imaging Review Ct Abdomen Pelvis W Contrast  01/31/2014   CLINICAL DATA:  Left lower quadrant pain.  History of cirrhosis.  EXAM: CT ABDOMEN AND PELVIS WITH CONTRAST  TECHNIQUE: Multidetector CT imaging of the abdomen and pelvis was performed using the standard protocol following bolus administration of intravenous contrast.  CONTRAST:  141mL OMNIPAQUE IOHEXOL 300 MG/ML  SOLN  COMPARISON:  DG ABD ACUTE W/CHEST dated 11/18/2013; CT ABD/PELVIS W CM dated 08/25/2013  FINDINGS: Minimal dependent atelectasis in the lung bases which are otherwise clear. No effusions.  Changes of cirrhosis with shrunken nodular liver. 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 Pancreas, adrenals and kidneys are unremarkable.  Two upper abdominal ventral wall hernias containing fat. More inferiorly, bulging of the anterior abdominal wall contains small bowel loops and  transverse colon. There are several dilated small bowel loops noted. Distal small bowel loops are decompressed. Findings concerning for partial small bowel obstruction. Exact transition is not visualized but likely within the bulging of the lower anterior abdominal wall.  No free fluid, free air or adenopathy. Urinary bladder is unremarkable. Aorta is heavily calcified, non aneurysmal.  IMPRESSION: Dilated small bowel loops into an area of outpouching in the anterior lower abdominal wall. Distal small bowel loops are decompressed. Findings compatible with partial small bowel obstruction. As this outpouching does  not appear to represent a true hernia but rather an area of focal weakness in the anterior abdominal wall, this may be related to adhesions in the area.  Smaller ventral hernias superior to this area containing fat.  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   Electronically Signed   By: Rolm Baptise M.D.   On: 01/31/2014 03:44    EKG Interpretation   None       MDM   Final diagnoses:  Partial small bowel obstruction    58yo M w/ h/o cirrhosis and Hep B and several abdominal surgeries including umbilical/incisional hernia repairs, appendectomy and peptic ulcer repair, presents w/ severe LLQ abdominal pain and vomiting since 9:30pm last night.  On exam, afebrile, uncomfortable appearing, abd soft/non-distended, incisional hernias, diffuse ttp w/ guarding, worst on L side.  Labs and CT abd/pelvis are pending.  Pt to receive IVF, dilaudid and zofran.  2:27 AM   Pain improved and nausea resolved.  Tolerating po contrast.  2:55 AM    CT shows partial SBO.  Results discussed w/ pt.  NG tube ordered.  I spoke with Dr. Ninfa Linden, who is very familiar with patient, and he says that he is not a surgical candidate.  [facility] consulted for admission.      Remer Macho, PA-C 01/31/14 [phone]

## 2014-01-31 NOTE — ED Notes (Signed)
Bed: ZD63 Expected date:  Expected time:  Means of arrival:  Comments: EMS 58yo M LLQ abd pain / N/V

## 2014-01-31 NOTE — ED Provider Notes (Signed)
See prior note   Janice Norrie, MD 01/31/14 [phone]

## 2014-01-31 NOTE — ED Provider Notes (Signed)
Patient reports he has had several hernia surgery repairs of his abdomen. At about 10 PM tonight he started having acute onset of diffuse abdominal pain with abdominal swelling. He is had nausea with vomiting. He reports his surgeon is Dr. Ninfa Linden.  Patient is alert and cooperative. He is noted to have some mild diffuse swelling of his abdomen however his abdomen is still soft. He has a midline surgical scar that is wide. Near the area of where you would expect to be superior to his umbilicus he is noted to have soft area of bulging about the size of an egg, however just lateral to this is an area that feels more firm.   Medical screening examination/treatment/procedure(s) were conducted as a shared visit with non-physician practitioner(s) and myself.  I personally evaluated the patient during the encounter.  EKG Interpretation   None        Rolland Porter, MD, Abr[facility] Sander   Janice Norrie, MD 01/31/14 [phone]

## 2014-01-31 NOTE — H&P (Signed)
[facility] History and Physical  Noah Medina HBZ:[id number] DOB: 03/28/1956 DOA: 01/31/2014  Referring physician: ER physician. PCP: Berkley Harvey, NP   Chief Complaint: Abdominal pain and nausea vomiting.  HPI: Noah Medina is a 58 y.o. male history of cirrhosis of liver, hepatitis C, alcohol abuse, chronic thrombocytopenia, essential tremors, history of bleeding gastric ulcers presents to the ER because of abdominal pain with nausea and vomiting. Patient's symptoms started last night with abdominal pain which was diffuse nonradiating associated with multiple episodes of nausea vomiting. In the ER CAT scan abdomen and pelvis shows partial small bowel obstruction with bowel in the outpouching in the anterior abdominal wall. Patient was placed on NG tube suction. On-call surgeon was consulted by the ED physician and they will be seeing patient in consult. Patient has been admitted for further management. Patient otherwise denies any chest pain or shortness of breath focal deficits headache or visual symptoms. Patient has had one bowel movement while in the ER. Patient denies any blood in the vomitus or bowel movement.   Review of Systems: As presented in the history of presenting illness, rest negative.  Past Medical History  Diagnosis Date  . Cirrhosis of liver   . Hepatitis C 03-28-12    ? 80's-prev. IV drug abuse  . 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 Blood transfusion 03-28-12    '81-hx. of bleeding ulcer  . History of bleeding ulcers 03-28-12    Hx. '81  . GERD (gastroesophageal reflux disease) 03-26-12    Hx. reflux. ulcers in past, occ. OTC med  . Incisional hernia     has drainage system from abdomin  . Subdural hematoma    Past Surgical History  Procedure Laterality Date  . Umbilical hernia repair    . Hernia repair  08/24/11    ventral hernia with mesh  . Hernia repair  10'17    Umbilical  . Peptic ulcer  03-26-12    open peptic  ulcer surgery repair- '81  . Incisional hernia repair  03/26/2012  . Incisional hernia repair  03/30/2012    Procedure: LAPAROSCOPIC INCISIONAL HERNIA;  Surgeon: Harl Bowie, MD;  Location: [facility];  Service: General;  Laterality: N/A;  Laparoscopic Incisional/ventral Hernia Repair with Mesh  . Tonsillectomy    . Appendectomy    . Incisional hernia repair  07/20/2012    mesh  . Incisional hernia repair  07/20/2012    Procedure: HERNIA REPAIR INCISIONAL;  Surgeon: Harl Bowie, MD;  Location: [facility];  Service: General;  Laterality: N/A;  repair of incisional hernia with Biologic mesh   Social History:  reports that he has been smoking Cigarettes.  He has a 9.5 pack-year smoking history. He has never used smokeless tobacco. He reports that he drinks about 21.0 ounces of alcohol per week. He reports that he does not use illicit drugs. Where does patient live home. Can patient participate in ADLs? Yes.  No Known Allergies  Family History:  Family History  Problem Relation Age of Onset  . Cancer Mother     breast  . Cancer Father     brain  . Heart disease Brother   . Cancer Sister     breast  . Cancer Sister     breast      Prior to Admission medications   Medication Sig Start Date End Date Taking? Authorizing Provider  ibuprofen (ADVIL,MOTRIN) 200 MG tablet Take 400 mg by mouth every 6 (six)  hours as needed.   Yes Historical Provider, MD  Multiple Vitamin (MULITIVITAMIN WITH MINERALS) TABS Take 1 tablet by mouth daily.   Yes Historical Provider, MD  spironolactone (ALDACTONE) 50 MG tablet Take 50 mg by mouth 2 (two) times daily.   Yes Historical Provider, MD    Physical Exam: Filed Vitals:   01/31/14 0058 01/31/14 0242 01/31/14 0527  BP: 148/62 143/65 148/74  Pulse: 78 90 86  Temp: 98.3 F (36.8 C)  98 F (36.7 C)  TempSrc: Oral  Oral  Resp: 20 18 18   SpO2: 97% 96% 96%     General:  Well-developed and moderately nourished.  Eyes: Anicteric no pallor.  ENT:  No discharge from the ears eyes nose mouth.  Neck: No mass felt.  Cardiovascular: S1-S2 heard.  Respiratory: No rhonchi or crepitations.  Abdomen: Soft nontender hernia present. No guarding rigidity.  Skin: No rash.  Musculoskeletal: No edema.  Psychiatric: Not suicidal.  Neurologic: Alert awake oriented to time place and person. Moves all extremities.  Labs on Admission:  Basic Metabolic Panel:  Recent Labs Lab 01/31/14 0152  NA 138  K 3.9  CL 103  CO2 24  GLUCOSE 143*  BUN 15  CREATININE 1.01  CALCIUM 8.4   Liver Function Tests:  Recent Labs Lab 01/31/14 0152  AST 52*  ALT 31  ALKPHOS 79  BILITOT 1.1  PROT 6.8  ALBUMIN 3.0*    Recent Labs Lab 01/31/14 0152  LIPASE 48   No results found for this basename: AMMONIA,  in the last 168 hours CBC:  Recent Labs Lab 01/31/14 0152  WBC 5.1  NEUTROABS PENDING  HGB 12.3*  HCT 36.1*  MCV 97.8  PLT 63*   Cardiac Enzymes: No results found for this basename: CKTOTAL, CKMB, CKMBINDEX, TROPONINI,  in the last 168 hours  BNP (last 3 results) No results found for this basename: PROBNP,  in the last 8760 hours CBG: No results found for this basename: GLUCAP,  in the last 168 hours  Radiological Exams on Admission: Ct Abdomen Pelvis W Contrast  01/31/2014   CLINICAL DATA:  Left lower quadrant pain.  History of cirrhosis.  EXAM: CT ABDOMEN AND PELVIS WITH CONTRAST  TECHNIQUE: Multidetector CT imaging of the abdomen and pelvis was performed using the standard protocol following bolus administration of intravenous contrast.  CONTRAST:  116mL OMNIPAQUE IOHEXOL 300 MG/ML  SOLN  COMPARISON:  DG ABD ACUTE W/CHEST dated 11/18/2013; CT ABD/PELVIS W CM dated 08/25/2013  FINDINGS: Minimal dependent atelectasis in the lung bases which are otherwise clear. No effusions.  Changes of cirrhosis with shrunken nodular liver. 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 Pancreas, adrenals and  kidneys are unremarkable.  Two upper abdominal ventral wall hernias containing fat. More inferiorly, bulging of the anterior abdominal wall contains small bowel loops and transverse colon. There are several dilated small bowel loops noted. Distal small bowel loops are decompressed. Findings concerning for partial small bowel obstruction. Exact transition is not visualized but likely within the bulging of the lower anterior abdominal wall.  No free fluid, free air or adenopathy. Urinary bladder is unremarkable. Aorta is heavily calcified, non aneurysmal.  IMPRESSION: Dilated small bowel loops into an area of outpouching in the anterior lower abdominal wall. Distal small bowel loops are decompressed. Findings compatible with partial small bowel obstruction. As this outpouching does not appear to represent a true hernia but rather an area of focal weakness in the anterior abdominal wall, this  may be related to adhesions in the area.  Smaller ventral hernias superior to this area containing fat.  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   Electronically Signed   By: Rolm Baptise M.D.   On: 01/31/2014 03:44     Assessment/Plan Principal Problem:   Partial small bowel obstruction Active Problems:   Cirrhosis   Cirrhosis of liver with history of hepatitis C   Pancytopenia   1. Partial small bowel obstruction - continue with NG tube suction. Patient has been placed n.p.o. When necessary pain relief medication and gentle hydration. Surgery to see patient for further recommendations. 2. History of cirrhosis of the liver and hepatitis C - patient has not been taking any of his diuretics for some time. Closely follow intake output and metabolic panel respiratory status. Check INR. 3. Pancytopenia - probably from cirrhosis of liver and alcoholism. Closely follow CBC. Have type and screen PRBC. 4. History of alcoholism - patient states he has not had any alcohol  for last 2 weeks and has largely decreased his alcohol intake. 5. History of GI bleed - follow CBC.  I have reviewed patient's old charts labs.  Code Status: Full code.  Family Communication: None.  Disposition Plan: Admit to inpatient.    Bethani Brugger N. [facility] Pager [phone].  If 7PM-7AM, please contact night-coverage [email or web address] Password [facility] 01/31/2014, 6:11 AM

## 2014-01-31 NOTE — ED Notes (Signed)
Patient transported to CT 

## 2014-01-31 NOTE — Progress Notes (Addendum)
Patient received alert/oriented when received via stretcher from ED.  Patient states he has no pain at this time, NG tube in right nare, oriented to room, call light, tv controls, NPO status, ordered SCD's and IV pump.  Spoke with [employer]er, [employer] for hospitalist assignment

## 2014-01-31 NOTE — Consult Note (Signed)
Noah Medina 11/21/1956  [id number].    Requesting MD: Dr. Devoria Albe Chief Complaint/Reason for Consult: partial SBO, N/V, abdominal pain  HPI:  58 y.o. White male history of cirrhosis of liver, Hep C, alcohol abuse, chronic thrombocytopenia, essential tremors, h/o bleeding gastric ulcers, and 6+hernia surgeries presents to [facility] because of abdominal pain with nausea and non-bloody vomiting since 10pm on 01/31/14. Patient's symptoms started with sharp abdominal pain which was diffuse nonradiating associated with multiple episodes of nausea/vomiting. In the ER CT scan abdomen and pelvis shows partial small bowel obstruction with bowel in the outpouching in the anterior abdominal wall. Patient was placed on NG tube suction. He is being admitted to the medicine service and we were asked to see the patient in consultation.  Dr. Magnus Ivan has done his most recent hernia surgeries in 07/2012.  Last seen in the office by Dr. Magnus Ivan 03/2013 and was not thought to be a good operative candidate for his recurrent ventral hernia.  Patient denies any CP/SOB, headaches/weakness, diarrhea.  His last BM was this am and he also had one last night, normal BM without blood.  Last meal was yesterday.  Of note over the last 4 days he c/o runny nose, sore throat, and cough.     ROS: All systems reviewed and otherwise negative except for as above  Family History  Problem Relation Age of Onset  . Cancer Mother     breast  . Cancer Father     brain  . Heart disease Brother   . Cancer Sister     breast  . Cancer Sister     breast    Past Medical History  Diagnosis Date  . Cirrhosis of liver   . Hepatitis C 03-28-12    ? 80's-prev. IV drug abuse  . 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 Blood transfusion 03-28-12    '81-hx. of bleeding ulcer  . History of bleeding ulcers 03-28-12    Hx. '81  . GERD (gastroesophageal reflux disease) 03-26-12    Hx. reflux. ulcers in past, occ. OTC med   . Incisional hernia     has drainage system from abdomin  . Subdural hematoma 12/13/12    Past Surgical History  Procedure Laterality Date  . Umbilical hernia repair    . Hernia repair  08/24/11    ventral hernia with mesh  . Hernia repair  11'11    Umbilical  . Peptic ulcer  03-26-12    open peptic ulcer surgery repair- '81  . Incisional hernia repair  03/26/2012  . Incisional hernia repair  03/30/2012    Procedure: LAPAROSCOPIC INCISIONAL HERNIA;  Surgeon: Shelly Rubenstein, MD;  Location: [facility];  Service: General;  Laterality: N/A;  Laparoscopic Incisional/ventral Hernia Repair with Mesh  . Tonsillectomy    . Appendectomy    . Incisional hernia repair  07/20/2012    mesh  . Incisional hernia repair  07/20/2012    Procedure: HERNIA REPAIR INCISIONAL;  Surgeon: Shelly Rubenstein, MD;  Location: [facility];  Service: General;  Laterality: N/A;  repair of incisional hernia with Biologic mesh    Social History:  reports that he has been smoking Cigarettes.  He has a 9.5 pack-year smoking history. He has never used smokeless tobacco. He reports that he drinks about 21.0 ounces of alcohol per week. He reports that he does not use illicit drugs.  Allergies: No Known Allergies   (Not in a hospital admission)  Blood pressure  148/74, pulse 86, temperature 98 F (36.7 C), temperature source Oral, resp. rate 18, SpO2 96.00%. Physical Exam: General: pleasant, WD/WN white male who is laying in bed in NAD HEENT: head is normocephalic, atraumatic.  Sclera are noninjected.  PERRL.  Ears and nose without any masses or lesions.  Mouth is pink and moist Heart: regular, rate, and rhythm.  No obvious murmurs, gallops, or rubs noted.  Palpable pedal pulses bilaterally Lungs: CTAB, no wheezes, rhonchi, or rales noted.  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   Results for orders placed during the hospital encounter of 01/31/14 (from the past 48 hour(s))  CBC WITH DIFFERENTIAL     Status: Abnormal   Collection Time    01/31/14  1:52 AM      Result Value Ref Range   WBC 5.1  4.0 - 10.5 K/uL   RBC 3.69 (*) 4.22 - 5.81 MIL/uL   Hemoglobin 12.3 (*) 13.0 - 17.0 g/dL   HCT 36.1 (*) 39.0 - 52.0 %   MCV 97.8  78.0 - 100.0 fL   MCH 33.3  26.0 - 34.0 pg   MCHC 34.1  30.0 - 36.0 g/dL   RDW 14.7  11.5 - 15.5 %   Platelets 63 (*) 150 - 400 K/uL   Comment: SPECIMEN CHECKED FOR CLOTS     PLATELET COUNT CONFIRMED BY SMEAR   Neutrophils Relative % 75  43 - 77 %   Lymphocytes Relative 14  12 - 46 %   Monocytes Relative 9  3 - 12 %   Eosinophils Relative 2  0 - 5 %   Basophils Relative 0  0 - 1 %   Neutro Abs 3.8  1.7 - 7.7 K/uL   Lymphs Abs 0.7  0.7 - 4.0 K/uL   Monocytes Absolute 0.5  0.1 - 1.0 K/uL   Eosinophils Absolute 0.1  0.0 - 0.7 K/uL   Basophils Absolute 0.0  0.0 - 0.1 K/uL  COMPREHENSIVE METABOLIC PANEL     Status: Abnormal   Collection Time    01/31/14  1:52 AM      Result Value Ref Range   Sodium 138  137 - 147 mEq/L   Potassium 3.9  3.7 - 5.3 mEq/L   Chloride 103  96 - 112 mEq/L   CO2 24  19 - 32 mEq/L   Glucose, Bld 143 (*) 70 - 99 mg/dL   BUN 15  6 - 23 mg/dL   Creatinine, Ser 1.01  0.50 - 1.35 mg/dL   Calcium 8.4  8.4 - 10.5 mg/dL   Total Protein 6.8  6.0 - 8.3 g/dL   Albumin 3.0 (*) 3.5 - 5.2 g/dL   AST 52 (*) 0 - 37 U/L   ALT 31  0 - 53 U/L   Alkaline Phosphatase 79  39 - 117 U/L   Total Bilirubin 1.1  0.3 - 1.2 mg/dL   GFR calc non Af Amer 80 (*) >90 mL/min   GFR calc Af Amer >90  >90 mL/min   Comment: (NOTE)     The eGFR has been calculated using  the CKD EPI equation.  This calculation has not been validated in all clinical situations.     eGFR's persistently <90 mL/min signify possible Chronic Kidney     Disease.  LIPASE, BLOOD     Status: None   Collection Time    01/31/14  1:52 AM      Result Value Ref Range   Lipase 48  11 - 59 U/L  URINALYSIS, ROUTINE W REFLEX MICROSCOPIC     Status: Abnormal   Collection Time    01/31/14  2:13 AM      Result Value Ref Range   Color, Urine AMBER (*) YELLOW   Comment: BIOCHEMICALS MAY BE AFFECTED BY COLOR   APPearance CLEAR  CLEAR   Specific Gravity, Urine 1.025  1.005 - 1.030   pH 6.0  5.0 - 8.0   Glucose, UA NEGATIVE  NEGATIVE mg/dL   Hgb urine dipstick SMALL (*) NEGATIVE   Bilirubin Urine NEGATIVE  NEGATIVE   Ketones, ur NEGATIVE  NEGATIVE mg/dL   Protein, ur NEGATIVE  NEGATIVE mg/dL   Urobilinogen, UA 1.0  0.0 - 1.0 mg/dL   Nitrite NEGATIVE  NEGATIVE   Leukocytes, UA NEGATIVE  NEGATIVE  URINE MICROSCOPIC-ADD ON     Status: None   Collection Time    01/31/14  2:13 AM      Result Value Ref Range   WBC, UA 0-2  <3 WBC/hpf   RBC / HPF 7-10  <3 RBC/hpf   Urine-Other MUCOUS PRESENT    URINE RAPID DRUG SCREEN (HOSP PERFORMED)     Status: None   Collection Time    01/31/14  5:04 AM      Result Value Ref Range   Opiates NONE DETECTED  NONE DETECTED   Cocaine NONE DETECTED  NONE DETECTED   Benzodiazepines NONE DETECTED  NONE DETECTED   Amphetamines NONE DETECTED  NONE DETECTED   Tetrahydrocannabinol NONE DETECTED  NONE DETECTED   Barbiturates NONE DETECTED  NONE DETECTED   Comment:            DRUG SCREEN FOR MEDICAL PURPOSES     ONLY.  IF CONFIRMATION IS NEEDED     FOR ANY PURPOSE, NOTIFY LAB     WITHIN 5 DAYS.                LOWEST DETECTABLE LIMITS     FOR URINE DRUG SCREEN     Drug Class       Cutoff (ng/mL)     Amphetamine      1000     Barbiturate      200     Benzodiazepine   073     Tricyclics       710     Opiates          300     Cocaine          300     THC               50   Ct Abdomen Pelvis W Contrast  01/31/2014   CLINICAL DATA:  Left lower quadrant pain.  History of cirrhosis.  EXAM: CT ABDOMEN AND PELVIS WITH CONTRAST  TECHNIQUE: Multidetector CT imaging of the abdomen and pelvis was performed using the standard protocol following bolus administration of intravenous contrast.  CONTRAST:  199mL OMNIPAQUE IOHEXOL 300 MG/ML  SOLN  COMPARISON:  DG ABD ACUTE W/CHEST dated 11/18/2013; CT ABD/PELVIS W CM dated 08/25/2013  FINDINGS: Minimal dependent atelectasis in the lung bases which are otherwise  clear. No effusions.  Changes of cirrhosis with shrunken nodular liver. 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 Pancreas, adrenals and kidneys are unremarkable.  Two upper abdominal ventral wall hernias containing fat. More inferiorly, bulging of the anterior abdominal wall contains small bowel loops and transverse colon. There are several dilated small bowel loops noted. Distal small bowel loops are decompressed. Findings concerning for partial small bowel obstruction. Exact transition is not visualized but likely within the bulging of the lower anterior abdominal wall.  No free fluid, free air or adenopathy. Urinary bladder is unremarkable. Aorta is heavily calcified, non aneurysmal.  IMPRESSION: Dilated small bowel loops into an area of outpouching in the anterior lower abdominal wall. Distal small bowel loops are decompressed. Findings compatible with partial small bowel obstruction. As this outpouching does not appear to represent a true hernia but rather an area of focal weakness in the anterior abdominal wall, this may be related to adhesions in the area.  Smaller ventral hernias superior to this area containing fat.  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   Electronically Signed   By: Rolm Baptise M.D.   On: 01/31/2014 03:44      Assessment/Plan pSBO containing  transverse colon Multiple recurrent ventral hernias Abdominal pain Nausea/vomiting  Plan: 1.  Medicine admitting, will follow with the patient 2.  Treat conservatively for now:  NPO, bowel rest, IVF, pain control, antiemetics 3.  SCD's and lovenox for DVT proph 4.  Ambulate and IS 5.  Would like to avoid surgery if possible due to poor surgical candidate and h/o multiple abdominal surgeries may make another surgery very complicated given mesh.  Hopefully since he appears to be only partially obstructed this will resolve on its own in the next few days.     Coralie Keens, [facility] 01/31/2014, 8:23 AM Pager: [phone]

## 2014-01-31 NOTE — Progress Notes (Signed)
UR completed 

## 2014-01-31 NOTE — Progress Notes (Addendum)
Patient ID: Noah Medina, male   DOB: 1955-12-25, 58 y.o.   MRN: [id number]  Addendum to admission note done today 01/31/2014:  Pt admitted with abdominal pain, nausea and vomiting. Pt was subsequently found to have partial small bowel obstruction.  Assessment and Plan: I agree with conservative management with bowel rest, NPO, analgesia, antiemetics and IV fluids.  Appreciate surgery following.  Leisa Lenz [facility] [phone]

## 2014-02-01 ENCOUNTER — Inpatient Hospital Stay (HOSPITAL_COMMUNITY): Payer: Medicaid Other

## 2014-02-01 DIAGNOSIS — F1010 Alcohol abuse, uncomplicated: Secondary | ICD-10-CM

## 2014-02-01 LAB — GLUCOSE, CAPILLARY
Glucose-Capillary: 101 mg/dL — ABNORMAL HIGH (ref 70–99)
Glucose-Capillary: 89 mg/dL (ref 70–99)
Glucose-Capillary: 92 mg/dL (ref 70–99)
Glucose-Capillary: 92 mg/dL (ref 70–99)
Glucose-Capillary: 89 mg/dL (ref 70–99)
Glucose-Capillary: 85 mg/dL (ref 70–99)

## 2014-02-01 LAB — BASIC METABOLIC PANEL
BUN: 14 mg/dL (ref 6–23)
CO2: 25 mEq/L (ref 19–32)
Calcium: 8.1 mg/dL — ABNORMAL LOW (ref 8.4–10.5)
Chloride: 105 mEq/L (ref 96–112)
Creatinine, Ser: 1.01 mg/dL (ref 0.50–1.35)
GFR calc Af Amer: >90 mL/min (ref >90)
GFR, EST NON AFRICAN AMERICAN: 80 mL/min — AB (ref >90)
Glucose, Bld: 102 mg/dL — ABNORMAL HIGH (ref 70–99)
POTASSIUM: 4.2 meq/L (ref 3.7–5.3)
SODIUM: 140 meq/L (ref 137–147)

## 2014-02-01 LAB — CBC
HCT: 34.7 % — ABNORMAL LOW (ref 39.0–52.0)
HEMOGLOBIN: 11.5 g/dL — AB (ref 13.0–17.0)
MCH: 33 pg (ref 26.0–34.0)
MCHC: 33.1 g/dL (ref 30.0–36.0)
MCV: 99.7 fL (ref 78.0–100.0)
PLATELETS: 64 10*3/uL — AB (ref 150–400)
RBC: 3.48 MIL/uL — AB (ref 4.22–5.81)
RDW: 14.6 % (ref 11.5–15.5)
WBC: 6.7 10*3/uL (ref 4.0–10.5)

## 2014-02-01 LAB — MRSA PCR SCREENING: MRSA by PCR: NEGATIVE

## 2014-02-01 MED ORDER — HEPARIN SODIUM (PORCINE) 5000 UNIT/ML IJ SOLN
5000.0000 [IU] | Freq: Three times a day (TID) | INTRAMUSCULAR | Status: DC
  Administered 2014-02-01 – 2014-02-03 (×6): 5000 [IU] via SUBCUTANEOUS
  Filled 2014-02-01 (×9): qty 1

## 2014-02-01 NOTE — Progress Notes (Signed)
NG tube clamped for 4 hr trial.  Pt instructed to call nurse for nausea or vomiting.

## 2014-02-01 NOTE — Progress Notes (Signed)
Subjective: He feels better and said he had flatus and BM early this AM nothing recorded about that.    Objective: Vital signs in last 24 hours: Temp:  [97.9 F (36.6 C)-98.7 F (37.1 C)] 98.4 F (36.9 C) (02/19 0648) Pulse Rate:  [72-81] 72 (02/19 0648) Resp:  [18-20] 20 (02/19 0648) BP: (123-138)/(59-72) 123/67 mmHg (02/19 0648) SpO2:  [89 %-92 %] 89 % (02/19 0648) Weight:  [95.482 kg (210 lb 8 oz)-96.9 kg (213 lb 10 oz)] 95.482 kg (210 lb 8 oz) (02/19 0648) Last BM Date: 01/31/14 100 NG recorded.  NPO Afebrile, VSS No film  Intake/Output from previous day: 02/18 0701 - 02/19 0700 In: -  Out: 325 [Urine:325] Intake/Output this shift: Total I/O In: 790.8 [I.V.:790.8] Out: 400 [Urine:300; Emesis/NG output:100]  General appearance: alert, cooperative and no distress GI: abdomen is soft, he has a large ventral defect, but he is not tender.  Hyperactive BS.  he has not been out of room.  Lab Results:   Recent Labs  01/31/14 1620 02/01/14 0456  WBC 5.3 6.7  HGB 12.0* 11.5*  HCT 34.5* 34.7*  PLT 59* 64*    BMET  Recent Labs  01/31/14 1620 02/01/14 0456  NA 137 140  K 3.8 4.2  CL 105 105  CO2 24 25  GLUCOSE 98 102*  BUN 12 14  CREATININE 0.79 1.01  CALCIUM 8.1* 8.1*   PT/INR  Recent Labs  01/31/14 0833  LABPROT 16.2*  INR 1.33     Recent Labs Lab 01/31/14 0152 01/31/14 1620  AST 52* 44*  ALT 31 27  ALKPHOS 79 66  BILITOT 1.1 0.9  PROT 6.8 6.2  ALBUMIN 3.0* 2.6*     Lipase     Component Value Date/Time   LIPASE 48 01/31/2014 0152     Studies/Results: Ct Abdomen Pelvis W Contrast  01/31/2014   CLINICAL DATA:  Left lower quadrant pain.  History of cirrhosis.  EXAM: CT ABDOMEN AND PELVIS WITH CONTRAST  TECHNIQUE: Multidetector CT imaging of the abdomen and pelvis was performed using the standard protocol following bolus administration of intravenous contrast.  CONTRAST:  128mL OMNIPAQUE IOHEXOL 300 MG/ML  SOLN  COMPARISON:  DG ABD ACUTE  W/CHEST dated 11/18/2013; CT ABD/PELVIS W CM dated 08/25/2013  FINDINGS: Minimal dependent atelectasis in the lung bases which are otherwise clear. No effusions.  Changes of cirrhosis with shrunken nodular liver. 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 Pancreas, adrenals and kidneys are unremarkable.  Two upper abdominal ventral wall hernias containing fat. More inferiorly, bulging of the anterior abdominal wall contains small bowel loops and transverse colon. There are several dilated small bowel loops noted. Distal small bowel loops are decompressed. Findings concerning for partial small bowel obstruction. Exact transition is not visualized but likely within the bulging of the lower anterior abdominal wall.  No free fluid, free air or adenopathy. Urinary bladder is unremarkable. Aorta is heavily calcified, non aneurysmal.  IMPRESSION: Dilated small bowel loops into an area of outpouching in the anterior lower abdominal wall. Distal small bowel loops are decompressed. Findings compatible with partial small bowel obstruction. As this outpouching does not appear to represent a true hernia but rather an area of focal weakness in the anterior abdominal wall, this may be related to adhesions in the area.  Smaller ventral hernias superior to this area containing fat.  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   Electronically Signed  By: Rolm Baptise M.D.   On: 01/31/2014 03:44    Medications: . pantoprazole (PROTONIX) IV  40 mg Intravenous QHS  . thiamine  100 mg Intravenous Daily   . sodium chloride 50 mL/hr at 02/01/14 1019   Prior to Admission medications   Medication Sig Start Date End Date Taking? Authorizing Provider  ibuprofen (ADVIL,MOTRIN) 200 MG tablet Take 400 mg by mouth every 6 (six) hours as needed.   Yes Historical Provider, MD  Multiple Vitamin (MULITIVITAMIN WITH MINERALS) TABS Take 1 tablet by  mouth daily.   Yes Historical Provider, MD  spironolactone (ALDACTONE) 50 MG tablet Take 50 mg by mouth 2 (two) times daily.   Yes Historical Provider, MD     Assessment/Plan pSBO containing transverse colon  Multiple recurrent ventral hernias with multiple abdominal surgeries. Hepatitis C Cirrhosis GERD/ bleeding Ulcers Hx of subdural hematoma   PLan:  Check film, clamping trial, ice chips and walk in halls. Add heparin for dvt Contrast is in the colon, nonspecific bowel pattern.  He wants to take the tube out, I will let them do it as it's  Not in far enough anyway. Ice chips and sips, if ok liquid diet tomorrow.  LOS: 1 day    JENNINGS,WILLARD 02/01/2014  Seems better. Agree with above.  Alphonsa Overall, MD, [facility] Surgery Pager: [phone] Office phone:  [phone]

## 2014-02-01 NOTE — Progress Notes (Signed)
[facility] PROGRESS NOTE  Noah Medina OVF:[id number] DOB: [birth year] DOA: 01/31/2014 PCP: Berkley Harvey, NP  Brief narrative: 58 y.o. male history of liver cirrhosis, hepatitis C, alcohol abuse, chronic thrombocytopenia, history of bleeding gastric ulcers who presented to [facility] ED 01/31/2014 with complaints of abdominal pain with nausea and vomiting. CT abdomen revealed partial SBO. Surgery assisting the management. Pt still has NG tube this am.  Assessment/Plan:  Principal Problem:   Partial small bowel obstruction - secondary to multiple abdominal surgeries - per surgery, continue conservative management with IV fluids, analgesia and antiemetics - clamp NG tube and try ice chips - continue protonix  - check abd x ray Active Problems:   Anemia and Thrombocytopenia - secondary to bone marrow suppression from liver cirrhosis - platelet count this am 64 and hemoglobin 11.5   Code Status: full code  Family Communication: no family at the bedside  Disposition Plan: home when stable   Leisa Lenz, MD  [facility] Pager [phone]  If 7PM-7AM, please contact night-coverage [email or web address] Password [email or web address] 02/01/2014, 9:04 AM   LOS: 1 day   Consultants:  Surgery   Procedures:  None   Antibiotics:  None   HPI/Subjective: No acute overnight events.  Objective: Filed Vitals:   01/31/14 1344 01/31/14 1510 01/31/14 2211 02/01/14 0648  BP: 138/59 136/72 136/71 123/67  Pulse: 81 81 74 72  Temp: 98.7 F (37.1 C) 98.3 F (36.8 C) 97.9 F (36.6 C) 98.4 F (36.9 C)  TempSrc: Oral Oral Oral Oral  Resp: 20 18 18 20   Height:  5\' 11"  (1.803 m)    Weight:  96.9 kg (213 lb 10 oz)  95.482 kg (210 lb 8 oz)  SpO2: 90% 92% 91% 89%    Intake/Output Summary (Last 24 hours) at 02/01/14 0904 Last data filed at 02/01/14 0750  Gross per 24 hour  Intake 790.83 ml  Output    525 ml  Net 265.83 ml    Exam:   General:  Pt is alert, follows commands appropriately, not  in acute distress  Cardiovascular: Regular rate and rhythm, S1/S2, no murmurs, no rubs, no gallops  Respiratory: Clear to auscultation bilaterally, no wheezing, no crackles, no rhonchi  Abdomen: not tender to palpation, non distended, bowel sounds present, no guarding; NG tube in place  Extremities: No edema, pulses DP and PT palpable bilaterally  Neuro: Grossly nonfocal  Data Reviewed: Basic Metabolic Panel:  Recent Labs Lab 01/31/14 0152 01/31/14 1620 02/01/14 0456  NA 138 137 140  K 3.9 3.8 4.2  CL 103 105 105  CO2 24 24 25   GLUCOSE 143* 98 102*  BUN 15 12 14   CREATININE 1.01 0.79 1.01  CALCIUM 8.4 8.1* 8.1*   Liver Function Tests:  Recent Labs Lab 01/31/14 0152 01/31/14 1620  AST 52* 44*  ALT 31 27  ALKPHOS 79 66  BILITOT 1.1 0.9  PROT 6.8 6.2  ALBUMIN 3.0* 2.6*    Recent Labs Lab 01/31/14 0152  LIPASE 48   No results found for this basename: AMMONIA,  in the last 168 hours CBC:  Recent Labs Lab 01/31/14 0152 01/31/14 1620 02/01/14 0456  WBC 5.1 5.3 6.7  NEUTROABS 3.8 4.0  --   HGB 12.3* 12.0* 11.5*  HCT 36.1* 34.5* 34.7*  MCV 97.8 98.0 99.7  PLT 63* 59* 64*   Cardiac Enzymes: No results found for this basename: CKTOTAL, CKMB, CKMBINDEX, TROPONINI,  in the last 168 hours BNP: No components found with this basename: POCBNP,  CBG:  Recent Labs Lab 01/31/14 2003 01/31/14 2210 02/01/14 0012 02/01/14 0404 02/01/14 0749  GLUCAP 76 87 89 101* 92    No results found for this or any previous visit (from the past 240 hour(s)).   Studies: Ct Abdomen Pelvis W Contrast 01/31/2014     IMPRESSION: Dilated small bowel loops into an area of outpouching in the anterior lower abdominal wall. Distal small bowel loops are decompressed. Findings compatible with partial small bowel obstruction. As this outpouching does not appear to represent a true hernia but rather an area of focal weakness in the anterior abdominal wall, this may be related to  adhesions in the area.  Smaller ventral hernias superior to this area containing fat.  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      Scheduled Meds: . pantoprazole (PROTONIX) IV  40 mg Intravenous QHS  . thiamine  100 mg Intravenous Daily   Continuous Infusions: . sodium chloride 50 mL (01/31/14 1524)

## 2014-02-02 ENCOUNTER — Inpatient Hospital Stay (HOSPITAL_COMMUNITY): Payer: Medicaid Other

## 2014-02-02 LAB — GLUCOSE, CAPILLARY
GLUCOSE-CAPILLARY: 79 mg/dL (ref 70–99)
GLUCOSE-CAPILLARY: 88 mg/dL (ref 70–99)
Glucose-Capillary: 83 mg/dL (ref 70–99)
Glucose-Capillary: 69 mg/dL — ABNORMAL LOW (ref 70–99)
Glucose-Capillary: 92 mg/dL (ref 70–99)

## 2014-02-02 NOTE — Progress Notes (Signed)
Subjective: Feeling better anxious to eat. No BM since yesterday, but lots of flatus.  Objective: Vital signs in last 24 hours: Temp:  [98.3 F (36.8 C)-98.8 F (37.1 C)] 98.3 F (36.8 C) (02/20 0628) Pulse Rate:  [68-87] 71 (02/20 0628) Resp:  [18] 18 (02/20 0628) BP: (116-134)/(62-68) 116/67 mmHg (02/20 0628) SpO2:  [93 %-95 %] 93 % (02/20 0628) Weight:  [94.257 kg (207 lb 12.8 oz)] 94.257 kg (207 lb 12.8 oz) (02/20 0656) Last BM Date: 01/31/14 Nothing po recorded Nothing record for BM Afebrile, VSS Intake/Output from previous day: 03/02/23 0701 - 02/20 0700 In: 1190.8 [I.V.:1190.8] Out: 775 [Urine:600; Emesis/NG output:175] Intake/Output this shift:    General appearance: alert, cooperative and no distress GI: soft + BS, and flatus, large ventral hernia  Lab Results:   Recent Labs  01/31/14 1620 03/02/14 0456  WBC 5.3 6.7  HGB 12.0* 11.5*  HCT 34.5* 34.7*  PLT 59* 64*    BMET  Recent Labs  01/31/14 1620 03/02/14 0456  NA 137 140  K 3.8 4.2  CL 105 105  CO2 24 25  GLUCOSE 98 102*  BUN 12 14  CREATININE 0.79 1.01  CALCIUM 8.1* 8.1*   PT/INR  Recent Labs  01/31/14 0833  LABPROT 16.2*  INR 1.33     Recent Labs Lab 01/31/14 0152 01/31/14 1620  AST 52* 44*  ALT 31 27  ALKPHOS 79 66  BILITOT 1.1 0.9  PROT 6.8 6.2  ALBUMIN 3.0* 2.6*     Lipase     Component Value Date/Time   LIPASE 48 01/31/2014 0152     Studies/Results: Dg Abd 2 Views  03-02-14   CLINICAL DATA:  Abdominal distension, suspect small bowel obstruction  EXAM: ABDOMEN - 2 VIEW  COMPARISON:  CT ABD/PELVIS W CM dated 01/31/2014  FINDINGS: The bowel gas pattern is relatively nonspecific. There are small amounts of gas within normal calibered small bowel throughout the abdomen. There is a small amount of gas and a moderate amount of stool and contrast within the transverse colon from the previous abdominal CT scan. There are calcifications in the gallbladder fossa most  compatible with stones. The proximal port of the nasogastric tube lies in the region of the GE junction or proximal gastric cardia. Advancement by 10 cm is recommended. No free extraluminal gas collections are demonstrated.  IMPRESSION: 1. The bowel gas pattern is relatively nonspecific without definite evidence of obstruction or ileus. A moderate amount of stool and contrast is present in the transverse colon. 2. There are likely gallstones present. 3. Advancement of the nasogastric tube by 10 cm will assure that the proximal port remains below the level of the GE junction.   Electronically Signed   By: Annaliah Rivenbark  [place]   On: March 02, 2014 14:26   Dg Abd Portable 1v  02/02/2014   CLINICAL DATA:  Small bowel obstruction  EXAM: PORTABLE ABDOMEN - 1 VIEW  COMPARISON:  03/02/2014  FINDINGS: Small amount residual contrast within colon to rectum.  Normal small bowel gas pattern.  No bowel dilatation or bowel wall thickening.  Osseous structures unremarkable.  Rounded calcifications right upper quadrant consistent with gallstones.  IMPRESSION: Nonobstructive bowel gas pattern.  Cholelithiasis.   Electronically Signed   By: Lavonia Dana M.D.   On: 02/02/2014 10:04    Medications: . heparin subcutaneous  5,000 Units Subcutaneous 3 times per day  . pantoprazole (PROTONIX) IV  40 mg Intravenous QHS  . thiamine  100 mg Intravenous Daily  Assessment/Plan pSBO containing transverse colon  Multiple recurrent ventral hernias with multiple abdominal surgeries.  Hepatitis C  Cirrhosis  GERD/ bleeding Ulcers  Hx of subdural hematoma   Plan:  Contrast is going down left colon, he is soft and feels better.  Liquid diet and advance as tolerated.  LOS: 2 days   JENNINGS,WILLARD 02/02/2014  Agree with above. Tolerating full liquids. Passing a lot of gas - no BM.  Alphonsa Overall, MD, [facility] Surgery Pager: [phone] Office phone:  [phone]

## 2014-02-02 NOTE — Progress Notes (Signed)
[facility] PROGRESS NOTE  Noah Medina ZOX:[id number] DOB: 1956-12-12 DOA: 01/31/2014 PCP: Berkley Harvey, NP  Brief narrative: 58 y.o. male history of liver cirrhosis, hepatitis C, alcohol abuse, chronic thrombocytopenia, history of bleeding gastric ulcers who presented to [facility] ED 01/31/2014 with complaints of abdominal pain with nausea and vomiting. CT abdomen revealed partial SBO. Surgery assisting the management. Repeat abdominal x ray shows non obstructive bowel gas pattern.  Assessment/Plan:   Principal Problem:  Partial small bowel obstruction  - secondary to multiple abdominal surgeries  - NG tube out; abd x ray showed nonobstructive bowel gas pattern; adv diet to full liquid - continue protonix  Active Problems:  Anemia and Thrombocytopenia  - secondary to bone marrow suppression from liver cirrhosis  - platelet count  64 and hemoglobin 11.5  - follow up CBC in am  Code Status: full code  Family Communication: no family at the bedside  Disposition Plan: home when stable   Consultants:  Surgery  Procedures:  None  Antibiotics:  None    Leisa Lenz, MD  [facility] Pager [phone]  If 7PM-7AM, please contact night-coverage [email or web address] Password [email or web address] 02/02/2014, 12:11 PM   LOS: 2 days    HPI/Subjective: Says he feels better.  Objective: Filed Vitals:   02/01/14 1425 02/01/14 2115 02/02/14 0628 02/02/14 0656  BP: 134/62 122/68 116/67   Pulse: 87 68 71   Temp: 98.5 F (36.9 C) 98.8 F (37.1 C) 98.3 F (36.8 C)   TempSrc: Oral Oral Oral   Resp: 18 18 18    Height:      Weight:    94.257 kg (207 lb 12.8 oz)  SpO2: 95% 93% 93%     Intake/Output Summary (Last 24 hours) at 02/02/14 1211 Last data filed at 02/01/14 2120  Gross per 24 hour  Intake    400 ml  Output    300 ml  Net    100 ml    Exam:   General:  Pt is alert, follows commands appropriately, not in acute distress  Cardiovascular: Regular rate and rhythm, S1/S2, no  murmurs, no rubs, no gallops  Respiratory: Clear to auscultation bilaterally, no wheezing, no crackles, no rhonchi  Abdomen: Softly distended, bowel sounds present, no guarding  Extremities: No edema, pulses DP and PT palpable bilaterally  Neuro: Grossly nonfocal  Data Reviewed: Basic Metabolic Panel:  Recent Labs Lab 01/31/14 0152 01/31/14 1620 02/01/14 0456  NA 138 137 140  K 3.9 3.8 4.2  CL 103 105 105  CO2 24 24 25   GLUCOSE 143* 98 102*  BUN 15 12 14   CREATININE 1.01 0.79 1.01  CALCIUM 8.4 8.1* 8.1*   Liver Function Tests:  Recent Labs Lab 01/31/14 0152 01/31/14 1620  AST 52* 44*  ALT 31 27  ALKPHOS 79 66  BILITOT 1.1 0.9  PROT 6.8 6.2  ALBUMIN 3.0* 2.6*    Recent Labs Lab 01/31/14 0152  LIPASE 48   No results found for this basename: AMMONIA,  in the last 168 hours CBC:  Recent Labs Lab 01/31/14 0152 01/31/14 1620 02/01/14 0456  WBC 5.1 5.3 6.7  NEUTROABS 3.8 4.0  --   HGB 12.3* 12.0* 11.5*  HCT 36.1* 34.5* 34.7*  MCV 97.8 98.0 99.7  PLT 63* 59* 64*   Cardiac Enzymes: No results found for this basename: CKTOTAL, CKMB, CKMBINDEX, TROPONINI,  in the last 168 hours BNP: No components found with this basename: POCBNP,  CBG:  Recent Labs Lab 02/01/14 1612 02/01/14 2036 02/02/14  8115 02/02/14 0418 02/02/14 0740  GLUCAP 89 85 88 83 79    Recent Results (from the past 240 hour(s))  MRSA PCR SCREENING     Status: None   Collection Time    02/01/14 10:23 PM      Result Value Ref Range Status   MRSA by PCR NEGATIVE  NEGATIVE Final   Comment:            The GeneXpert MRSA Assay (FDA     approved for NASAL specimens     only), is one component of a     comprehensive MRSA colonization     surveillance program. It is not     intended to diagnose MRSA     infection nor to guide or     monitor treatment for     MRSA infections.     Studies: Dg Abd 2 Views 02/01/2014   IMPRESSION: 1. The bowel gas pattern is relatively nonspecific  without definite evidence of obstruction or ileus. A moderate amount of stool and contrast is present in the transverse colon. 2. There are likely gallstones present. 3. Advancement of the nasogastric tube by 10 cm will assure that the proximal port remains below the level of the GE junction.   Electronically Signed   By: David  [place]   On: 02/01/2014 14:26   Dg Abd Portable 1v 02/02/2014     IMPRESSION: Nonobstructive bowel gas pattern.  Cholelithiasis.   Electronically Signed   By: Lavonia Dana M.D.   On: 02/02/2014 10:04    Scheduled Meds: . heparin subcutaneous  5,000 Units Subcutaneous 3 times per day  . pantoprazole (PROTONIX) IV  40 mg Intravenous QHS  . thiamine  100 mg Intravenous Daily   Continuous Infusions:

## 2014-02-03 DIAGNOSIS — K219 Gastro-esophageal reflux disease without esophagitis: Secondary | ICD-10-CM

## 2014-02-03 LAB — CBC
HEMATOCRIT: 31.9 % — AB (ref 39.0–52.0)
Hemoglobin: 11.1 g/dL — ABNORMAL LOW (ref 13.0–17.0)
MCH: 33.1 pg (ref 26.0–34.0)
MCHC: 34.8 g/dL (ref 30.0–36.0)
MCV: 95.2 fL (ref 78.0–100.0)
Platelets: 67 10*3/uL — ABNORMAL LOW (ref 150–400)
RBC: 3.35 MIL/uL — AB (ref 4.22–5.81)
RDW: 14 % (ref 11.5–15.5)
WBC: 4.4 10*3/uL (ref 4.0–10.5)

## 2014-02-03 LAB — BASIC METABOLIC PANEL
BUN: 10 mg/dL (ref 6–23)
CALCIUM: 8.2 mg/dL — AB (ref 8.4–10.5)
CO2: 23 mEq/L (ref 19–32)
Chloride: 101 mEq/L (ref 96–112)
Creatinine, Ser: 0.94 mg/dL (ref 0.50–1.35)
GFR calc Af Amer: >90 mL/min (ref >90)
Glucose, Bld: 104 mg/dL — ABNORMAL HIGH (ref 70–99)
Potassium: 3.8 mEq/L (ref 3.7–5.3)
Sodium: 136 mEq/L — ABNORMAL LOW (ref 137–147)

## 2014-02-03 MED ORDER — POLYETHYLENE GLYCOL 3350 17 G PO PACK
17.0000 g | PACK | Freq: Two times a day (BID) | ORAL | Status: DC
  Administered 2014-02-03: 17 g via ORAL
  Filled 2014-02-03 (×2): qty 1

## 2014-02-03 MED ORDER — OXYCODONE-ACETAMINOPHEN 5-325 MG PO TABS: 1.0000 | ORAL_TABLET | ORAL | Status: DC | PRN

## 2014-02-03 MED ORDER — HYDROCOD POLST-CHLORPHEN POLST 10-8 MG/5ML PO LQCR
5.0000 mL | Freq: Two times a day (BID) | ORAL | Status: DC
  Administered 2014-02-03: 5 mL via ORAL
  Filled 2014-02-03: qty 5

## 2014-02-03 MED ORDER — POLYETHYLENE GLYCOL 3350 17 G PO PACK: 17.0000 g | PACK | Freq: Two times a day (BID) | ORAL | Status: DC

## 2014-02-03 MED ORDER — DOCUSATE SODIUM 100 MG PO CAPS
100.0000 mg | ORAL_CAPSULE | Freq: Two times a day (BID) | ORAL | Status: DC
  Administered 2014-02-03: 100 mg via ORAL
  Filled 2014-02-03 (×2): qty 1

## 2014-02-03 MED ORDER — DSS 100 MG PO CAPS: 100.0000 mg | ORAL_CAPSULE | Freq: Two times a day (BID) | ORAL | Status: DC

## 2014-02-03 NOTE — Discharge Instructions (Signed)
Small Bowel Obstruction A small bowel obstruction is a blockage (obstruction) of the small intestine (small bowel). The small bowel is a long, slender tube that connects the stomach to the colon. Its job is to absorb nutrients from the fluids and foods you consume into the bloodstream.  CAUSES  There are many causes of intestinal blockage. The most common ones include:  Hernias. This is a more common cause in children than adults.  Inflammatory bowel disease (enteritis and colitis).  Twisting of the bowel (volvulus).  Tumors.  Scar tissue (adhesions) from previous surgery or radiation treatment.  Recent surgery. This may cause an acute small bowel obstruction called an ileus. SYMPTOMS   Abdominal pain. This may be dull cramps or sharp pain. It may occur in one area or may be present in the entire abdomen. Pain can range from mild to severe, depending on the degree of obstruction.  Nausea and vomiting. Vomit may be greenish or yellow bile color.  Distended or swollen stomach. Abdominal bloating is a common symptom.  Constipation.  Lack of passing gas.  Frequent belching.  Diarrhea. This may occur if runny stool is able to leak around the obstruction. DIAGNOSIS  Your caregiver can usually diagnose small bowel obstruction by taking a history, doing a physical exam, and taking X-rays. If the cause is unclear, a CT scan (computerized tomography) of your abdomen and pelvis may be needed. TREATMENT  Treatment of the blockage depends on the cause and how bad the problem is.   Sometimes, the obstruction improves with bed rest and intravenous (IV) fluids.  Resting the bowel is very important. This means following a simple diet. Sometimes, a clear liquid diet may be required for several days.  Sometimes, a small tube (nasogastric tube) is placed into the stomach to decompress the bowel. When the bowel is blocked, it usually swells up like a balloon filled with air and fluids.  Decompression means that the air and fluids are removed by suction through that tube. This can help with pain, discomfort, and nausea. It can also help the obstruction resolve faster.  Surgery may be required if other treatments do not work. Bowel obstruction from a hernia may require early surgery and can be an emergency procedure. Adhesions that cause frequent or severe obstructions may also require surgery. HOME CARE INSTRUCTIONS If your bowel obstruction is only partial or incomplete, you may be allowed to go home.  Get plenty of rest.  Follow your diet as directed by your caregiver.  Only consume clear liquids until your condition improves.  Avoid solid foods as instructed. SEEK IMMEDIATE MEDICAL CARE IF:  You have increased pain or cramping.  You vomit blood.  You have uncontrolled vomiting or nausea.  You cannot drink fluids due to vomiting or pain.  You develop confusion.  You begin feeling very dry or thirsty (dehydrated).  You have severe bloating.  You have chills.  You have a fever.  You feel extremely weak or you faint. MAKE SURE YOU:  Understand these instructions.  Will watch your condition.  Will get help right away if you are not doing well or get worse. Document Released: 02/16/2006 Document Revised: 02/22/2012 Document Reviewed: 02/13/2011 ExitCare Patient Information 2014 [employer].  

## 2014-02-03 NOTE — Progress Notes (Signed)
Patient ID: Noah Medina, male   DOB: 02-07-1956, 58 y.o.   MRN: [id number]  General Surgery - [facility] - Progress Note  Subjective: Patient ambulating.  No complaints.  Tolerating full liquid diet.  Wants to try regular diet for lunch and possibly for discharge home this afternoon.  Objective: Vital signs in last 24 hours: Temp:  [97.9 F (36.6 C)-99.2 F (37.3 C)] 98.2 F (36.8 C) (02/21 0536) Pulse Rate:  [70-76] 76 (02/21 0536) Resp:  [16-18] 18 (02/21 0536) BP: (114-137)/(66-73) 114/66 mmHg (02/21 0536) SpO2:  [96 %-98 %] 96 % (02/21 0536) Last BM Date: 2014-03-01  Intake/Output from previous day: 02/20 0701 - 02/21 0700 In: 960 [P.O.:960] Out: -   Exam: HEENT - clear, not icteric Neck - soft Chest - clear bilaterally Cor - RRR, no murmur Abd - soft, protuberant; large eventration likely recurrent hernia; soft and reducible; no mass Ext - no significant edema Neuro - grossly intact, no focal deficits  Lab Results:   Recent Labs  03-01-14 0456 02/03/14 0529  WBC 6.7 4.4  HGB 11.5* 11.1*  HCT 34.7* 31.9*  PLT 64* 67*     Recent Labs  03-01-2014 0456 02/03/14 0529  NA 140 136*  K 4.2 3.8  CL 105 101  CO2 25 23  GLUCOSE 102* 104*  BUN 14 10  CREATININE 1.01 0.94  CALCIUM 8.1* 8.2*    Studies/Results: Dg Abd 2 Views  03/01/14   CLINICAL DATA:  Abdominal distension, suspect small bowel obstruction  EXAM: ABDOMEN - 2 VIEW  COMPARISON:  CT ABD/PELVIS W CM dated 01/31/2014  FINDINGS: The bowel gas pattern is relatively nonspecific. There are small amounts of gas within normal calibered small bowel throughout the abdomen. There is a small amount of gas and a moderate amount of stool and contrast within the transverse colon from the previous abdominal CT scan. There are calcifications in the gallbladder fossa most compatible with stones. The proximal port of the nasogastric tube lies in the region of the GE junction or proximal gastric  cardia. Advancement by 10 cm is recommended. No free extraluminal gas collections are demonstrated.  IMPRESSION: 1. The bowel gas pattern is relatively nonspecific without definite evidence of obstruction or ileus. A moderate amount of stool and contrast is present in the transverse colon. 2. There are likely gallstones present. 3. Advancement of the nasogastric tube by 10 cm will assure that the proximal port remains below the level of the GE junction.   Electronically Signed   By: David  [place]   On: 03-01-14 14:26   Dg Abd Portable 1v  02/02/2014   CLINICAL DATA:  Small bowel obstruction  EXAM: PORTABLE ABDOMEN - 1 VIEW  COMPARISON:  Mar 01, 2014  FINDINGS: Small amount residual contrast within colon to rectum.  Normal small bowel gas pattern.  No bowel dilatation or bowel wall thickening.  Osseous structures unremarkable.  Rounded calcifications right upper quadrant consistent with gallstones.  IMPRESSION: Nonobstructive bowel gas pattern.  Cholelithiasis.   Electronically Signed   By: Lavonia Dana M.D.   On: 02/02/2014 10:04    Assessment / Plan: 1.  Partial SBO - resolving  Advance diet per medical service  Probably home later today 2.  Ventral hernia recurrence versus eventration  Will arrange follow-up at [place] office with Dr. Nedra Hai  Surgical options likely extremely limited given underlying medical issues - patient understands  Earnstine Regal, MD, [facility] Office: [phone]  02/03/2014

## 2014-02-03 NOTE — Discharge Summary (Signed)
Physician Discharge Summary  Noah Medina NGE:[id number] DOB: 07/21/1956 DOA: 01/31/2014  PCP: Berkley Harvey, NP  Admit date: 01/31/2014 Discharge date: 02/03/2014  Recommendations for Outpatient Follow-up:  1. You were hospitalized for small bowel obstruction. Diet is regular. Make sure you take miralax an docusate to make sure you have daily bowel movement.  Discharge Diagnoses:  Principal Problem:   Partial small bowel obstruction Active Problems:   Cirrhosis   Cirrhosis of liver with history of hepatitis C   Pancytopenia    Discharge Condition: medically stable for discharge home today  Diet recommendation: as tolerated  History of present illness:  58 y.o. male history of liver cirrhosis, hepatitis C, alcohol abuse, chronic thrombocytopenia, history of bleeding gastric ulcers who presented to [facility] ED 01/31/2014 with complaints of abdominal pain with nausea and vomiting. CT abdomen revealed partial SBO. Surgery assisting the management. Repeat abdominal x ray shows non obstructive bowel gas pattern.   Assessment/Plan:   Principal Problem:  Partial small bowel obstruction  - secondary to multiple abdominal surgeries  - NG tube out; abd x ray showed nonobstructive bowel gas pattern; adv diet to full liquid  - continue protonix  - tolerated regular diet Active Problems:  Anemia and Thrombocytopenia  - secondary to bone marrow suppression from liver cirrhosis  - platelet count 64 and hemoglobin 11.5   Code Status: full code  Family Communication: no family at the bedside   Consultants:  Surgery  Procedures:  None  Antibiotics:  None    Signed:  Leisa Lenz, MD  [facility] 02/03/2014, 1:49 PM  Pager #: [phone]    Discharge Exam: Filed Vitals:   02/03/14 0536  BP: 114/66  Pulse: 76  Temp: 98.2 F (36.8 C)  Resp: 18   Filed Vitals:   02/02/14 0656 02/02/14 1406 02/02/14 2240 02/03/14 0536  BP:  137/70 124/73 114/66  Pulse:  74 70 76   Temp:  99.2 F (37.3 C) 97.9 F (36.6 C) 98.2 F (36.8 C)  TempSrc:  Oral Oral Oral  Resp:  16 18 18   Height:      Weight: 94.257 kg (207 lb 12.8 oz)     SpO2:  97% 98% 96%    General: Pt is alert, follows commands appropriately, not in acute distress Cardiovascular: Regular rate and rhythm, S1/S2 +, no murmurs, no rubs, no gallops Respiratory: Clear to auscultation bilaterally, no wheezing, no crackles, no rhonchi Abdominal: Soft, non tender, non distended, bowel sounds +, no guarding Extremities: no edema, no cyanosis, pulses palpable bilaterally DP and PT Neuro: Grossly nonfocal  Discharge Instructions  Discharge Orders   Future Orders Complete By Expires   Call MD for:  difficulty breathing, headache or visual disturbances  As directed    Call MD for:  persistant nausea and vomiting  As directed    Call MD for:  redness, tenderness, or signs of infection (pain, swelling, redness, odor or green/yellow discharge around incision site)  As directed    Call MD for:  temperature >100.4  As directed    Diet - low sodium heart healthy  As directed    Discharge instructions  As directed    Comments:     1. You were hospitalized for small bowel obstruction. Diet is regular. Make sure you take miralax an docusate to make sure you have daily bowel movement.   Increase activity slowly  As directed        Medication List  DSS 100 MG Caps  Take 100 mg by mouth 2 (two) times daily.     ibuprofen 200 MG tablet  Commonly known as:  ADVIL,MOTRIN  Take 400 mg by mouth every 6 (six) hours as needed.     multivitamin with minerals Tabs tablet  Take 1 tablet by mouth daily.     oxyCODONE-acetaminophen 5-325 MG per tablet  Commonly known as:  PERCOCET  Take 1 tablet by mouth every 4 (four) hours as needed for moderate pain or severe pain.     polyethylene glycol packet  Commonly known as:  MIRALAX / GLYCOLAX  Take 17 g by mouth 2 (two) times daily.     spironolactone 50 MG  tablet  Commonly known as:  ALDACTONE  Take 50 mg by mouth 2 (two) times daily.           Follow-up Information   Follow up with Berkley Harvey, NP. Schedule an appointment as soon as possible for a visit in 2 weeks.   Specialty:  Nurse Practitioner   Contact information:   [address] [place] [address] [phone]        The results of significant diagnostics from this hospitalization (including imaging, microbiology, ancillary and laboratory) are listed below for reference.    Significant Diagnostic Studies: Ct Abdomen Pelvis W Contrast  01/31/2014   CLINICAL DATA:  Left lower quadrant pain.  History of cirrhosis.  EXAM: CT ABDOMEN AND PELVIS WITH CONTRAST  TECHNIQUE: Multidetector CT imaging of the abdomen and pelvis was performed using the standard protocol following bolus administration of intravenous contrast.  CONTRAST:  165mL OMNIPAQUE IOHEXOL 300 MG/ML  SOLN  COMPARISON:  DG ABD ACUTE W/CHEST dated 11/18/2013; CT ABD/PELVIS W CM dated 08/25/2013  FINDINGS: Minimal dependent atelectasis in the lung bases which are otherwise clear. No effusions.  Changes of cirrhosis with shrunken nodular liver. 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 Pancreas, adrenals and kidneys are unremarkable.  Two upper abdominal ventral wall hernias containing fat. More inferiorly, bulging of the anterior abdominal wall contains small bowel loops and transverse colon. There are several dilated small bowel loops noted. Distal small bowel loops are decompressed. Findings concerning for partial small bowel obstruction. Exact transition is not visualized but likely within the bulging of the lower anterior abdominal wall.  No free fluid, free air or adenopathy. Urinary bladder is unremarkable. Aorta is heavily calcified, non aneurysmal.  IMPRESSION: Dilated small bowel loops into an area of outpouching in the anterior lower abdominal wall. Distal small bowel  loops are decompressed. Findings compatible with partial small bowel obstruction. As this outpouching does not appear to represent a true hernia but rather an area of focal weakness in the anterior abdominal wall, this may be related to adhesions in the area.  Smaller ventral hernias superior to this area containing fat.  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   Electronically Signed   By: Rolm Baptise M.D.   On: 01/31/2014 03:44   Dg Abd 2 Views  02/01/2014   CLINICAL DATA:  Abdominal distension, suspect small bowel obstruction  EXAM: ABDOMEN - 2 VIEW  COMPARISON:  CT ABD/PELVIS W CM dated 01/31/2014  FINDINGS: The bowel gas pattern is relatively nonspecific. There are small amounts of gas within normal calibered small bowel throughout the abdomen. There is a small amount of gas and a moderate amount of stool and contrast within the transverse colon from  the previous abdominal CT scan. There are calcifications in the gallbladder fossa most compatible with stones. The proximal port of the nasogastric tube lies in the region of the GE junction or proximal gastric cardia. Advancement by 10 cm is recommended. No free extraluminal gas collections are demonstrated.  IMPRESSION: 1. The bowel gas pattern is relatively nonspecific without definite evidence of obstruction or ileus. A moderate amount of stool and contrast is present in the transverse colon. 2. There are likely gallstones present. 3. Advancement of the nasogastric tube by 10 cm will assure that the proximal port remains below the level of the GE junction.   Electronically Signed   By: David  Martinique   On: 02/01/2014 14:26   Dg Abd Portable 1v  02/02/2014   CLINICAL DATA:  Small bowel obstruction  EXAM: PORTABLE ABDOMEN - 1 VIEW  COMPARISON:  02/01/2014  FINDINGS: Small amount residual contrast within colon to rectum.  Normal small bowel gas pattern.  No bowel dilatation or bowel wall thickening.   Osseous structures unremarkable.  Rounded calcifications right upper quadrant consistent with gallstones.  IMPRESSION: Nonobstructive bowel gas pattern.  Cholelithiasis.   Electronically Signed   By: Lavonia Dana M.D.   On: 02/02/2014 10:04    Microbiology: Recent Results (from the past 240 hour(s))  MRSA PCR SCREENING     Status: None   Collection Time    02/01/14 10:23 PM      Result Value Ref Range Status   MRSA by PCR NEGATIVE  NEGATIVE Final   Comment:            The GeneXpert MRSA Assay (FDA     approved for NASAL specimens     only), is one component of a     comprehensive MRSA colonization     surveillance program. It is not     intended to diagnose MRSA     infection nor to guide or     monitor treatment for     MRSA infections.     Labs: Basic Metabolic Panel:  Recent Labs Lab 01/31/14 0152 01/31/14 1620 02/01/14 0456 02/03/14 0529  NA 138 137 140 136*  K 3.9 3.8 4.2 3.8  CL 103 105 105 101  CO2 24 24 25 23   GLUCOSE 143* 98 102* 104*  BUN 15 12 14 10   CREATININE 1.01 0.79 1.01 0.94  CALCIUM 8.4 8.1* 8.1* 8.2*   Liver Function Tests:  Recent Labs Lab 01/31/14 0152 01/31/14 1620  AST 52* 44*  ALT 31 27  ALKPHOS 79 66  BILITOT 1.1 0.9  PROT 6.8 6.2  ALBUMIN 3.0* 2.6*    Recent Labs Lab 01/31/14 0152  LIPASE 48   No results found for this basename: AMMONIA,  in the last 168 hours CBC:  Recent Labs Lab 01/31/14 0152 01/31/14 1620 02/01/14 0456 02/03/14 0529  WBC 5.1 5.3 6.7 4.4  NEUTROABS 3.8 4.0  --   --   HGB 12.3* 12.0* 11.5* 11.1*  HCT 36.1* 34.5* 34.7* 31.9*  MCV 97.8 98.0 99.7 95.2  PLT 63* 59* 64* 67*   Cardiac Enzymes: No results found for this basename: CKTOTAL, CKMB, CKMBINDEX, TROPONINI,  in the last 168 hours BNP: BNP (last 3 results) No results found for this basename: PROBNP,  in the last 8760 hours CBG:  Recent Labs Lab 02/02/14 0026 02/02/14 0418 02/02/14 0740 02/02/14 1129 02/02/14 1646  GLUCAP 88 83 79 69*  92    Time coordinating discharge: Over 30 minutes

## 2014-02-07 ENCOUNTER — Emergency Department (HOSPITAL_COMMUNITY): Payer: Medicaid Other

## 2014-02-07 ENCOUNTER — Encounter (HOSPITAL_COMMUNITY): Payer: Self-pay | Admitting: Emergency Medicine

## 2014-02-07 ENCOUNTER — Emergency Department (HOSPITAL_COMMUNITY)
Admission: EM | Admit: 2014-02-07 | Discharge: 2014-02-07 | Disposition: A | Discharge status: Home / Self Care | Payer: Medicaid Other | Attending: Emergency Medicine | Admitting: Emergency Medicine

## 2014-02-07 DIAGNOSIS — R059 Cough, unspecified: Secondary | ICD-10-CM | POA: Insufficient documentation

## 2014-02-07 DIAGNOSIS — Z8619 Personal history of other infectious and parasitic diseases: Secondary | ICD-10-CM | POA: Insufficient documentation

## 2014-02-07 DIAGNOSIS — R0602 Shortness of breath: Secondary | ICD-10-CM | POA: Insufficient documentation

## 2014-02-07 DIAGNOSIS — R05 Cough: Secondary | ICD-10-CM

## 2014-02-07 DIAGNOSIS — F17200 Nicotine dependence, unspecified, uncomplicated: Secondary | ICD-10-CM | POA: Insufficient documentation

## 2014-02-07 DIAGNOSIS — Z8719 Personal history of other diseases of the digestive system: Secondary | ICD-10-CM | POA: Insufficient documentation

## 2014-02-07 DIAGNOSIS — J3489 Other specified disorders of nose and nasal sinuses: Secondary | ICD-10-CM | POA: Insufficient documentation

## 2014-02-07 MED ORDER — ALBUTEROL SULFATE HFA 108 (90 BASE) MCG/ACT IN AERS
2.0000 | INHALATION_SPRAY | RESPIRATORY_TRACT | Status: DC | PRN
  Administered 2014-02-07: 2 via RESPIRATORY_TRACT
  Filled 2014-02-07: qty 6.7

## 2014-02-07 MED ORDER — AZITHROMYCIN 250 MG PO TABS: 250.0000 mg | ORAL_TABLET | Freq: Every day | ORAL | Status: DC

## 2014-02-07 NOTE — ED Provider Notes (Signed)
Medical screening examination/treatment/procedure(s) were performed by non-physician practitioner and as supervising physician I was immediately available for consultation/collaboration.  EKG Interpretation   None         Israel Wunder David Syann Cupples III, MD 02/07/14 1635 

## 2014-02-07 NOTE — Discharge Instructions (Signed)

## 2014-02-07 NOTE — ED Provider Notes (Signed)
CSN: [id number]     Arrival date & time 02/07/14  8242 History   First MD Initiated Contact with Patient 02/07/14 1030     Chief Complaint  Patient presents with  . Cough  . Nasal Congestion     (Consider location/radiation/quality/duration/timing/severity/associated sxs/prior Treatment) HPI Comments: Patient presents emergency department with chief complaint of cough and congestion x2 weeks. He states that his cough is productive for yellow sputum. He has not tried taking anything to alleviate his symptoms. He states that he smokes daily. He denies any fevers or chills. Nothing makes his symptoms better or worse. He denies any chest pain.  The history is provided by the patient. No language interpreter was used.    Past Medical History  Diagnosis Date  . Cirrhosis of liver   . Hepatitis C 03-28-12    ? 80's-prev. IV drug abuse  . 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 Blood transfusion 03-28-12    '81-hx. of bleeding ulcer  . History of bleeding ulcers 03-28-12    Hx. '81  . GERD (gastroesophageal reflux disease) 03-26-12    Hx. reflux. ulcers in past, occ. OTC med  . Incisional hernia     has drainage system from abdomin  . Subdural hematoma 12/13/12   Past Surgical History  Procedure Laterality Date  . Umbilical hernia repair    . Hernia repair  08/24/11    ventral hernia with mesh  . Hernia repair  35'36    Umbilical  . Peptic ulcer  03-26-12    open peptic ulcer surgery repair- '81  . Incisional hernia repair  03/26/2012  . Incisional hernia repair  03/30/2012    Procedure: LAPAROSCOPIC INCISIONAL HERNIA;  Surgeon: Harl Bowie, MD;  Location: [facility];  Service: General;  Laterality: N/A;  Laparoscopic Incisional/ventral Hernia Repair with Mesh  . Tonsillectomy    . Appendectomy    . Incisional hernia repair  07/20/2012    mesh  . Incisional hernia repair  07/20/2012    Procedure: HERNIA REPAIR INCISIONAL;  Surgeon: Harl Bowie, MD;   Location: [facility];  Service: General;  Laterality: N/A;  repair of incisional hernia with Biologic mesh   Family History  Problem Relation Age of Onset  . Cancer Mother     breast  . Cancer Father     brain  . Heart disease Brother   . Cancer Sister     breast  . Cancer Sister     breast   History  Substance Use Topics  . Smoking status: Current Every Day Smoker -- 0.25 packs/day for 38 years    Types: Cigarettes  . Smokeless tobacco: Never Used  . Alcohol Use: 21.0 oz/week    35 Cans of beer per week     Comment: 6 beers a week per pt    Review of Systems  Constitutional: Negative for fever.  HENT: Negative for sore throat.   Respiratory: Positive for cough and shortness of breath. Negative for wheezing.   Cardiovascular: Negative for chest pain.      Allergies  Review of patient's allergies indicates no known allergies.  Home Medications   Current Outpatient Rx  Name  Route  Sig  Dispense  Refill  . ibuprofen (ADVIL,MOTRIN) 200 MG tablet   Oral   Take 400 mg by mouth every 6 (six) hours as needed for headache or moderate pain.          . Multiple Vitamin (MULITIVITAMIN WITH MINERALS)  TABS   Oral   Take 1 tablet by mouth daily.         Marland Kitchen azithromycin (ZITHROMAX Z-PAK) 250 MG tablet   Oral   Take 1 tablet (250 mg total) by mouth daily. 500mg  PO day 1, then 250mg  PO days 205   6 tablet   0    BP 118/59  Pulse 85  Temp(Src) 98.4 F (36.9 C) (Oral)  Resp 20  SpO2 95% Physical Exam  Nursing note and vitals reviewed. Constitutional: He is oriented to person, place, and time. He appears well-developed and well-nourished.  HENT:  Head: Normocephalic and atraumatic.  Eyes: Conjunctivae and EOM are normal. Pupils are equal, round, and reactive to light. Right eye exhibits no discharge. Left eye exhibits no discharge. No scleral icterus.  Neck: Normal range of motion. Neck supple. No JVD present.  Cardiovascular: Normal rate, regular rhythm and normal heart  sounds.  Exam reveals no gallop and no friction rub.   No murmur heard. Pulmonary/Chest: Effort normal and breath sounds normal. No respiratory distress. He has no wheezes. He has no rales. He exhibits no tenderness.  Abdominal: Soft. He exhibits no distension and no mass. There is no tenderness. There is no rebound and no guarding.  Musculoskeletal: Normal range of motion. He exhibits no edema and no tenderness.  Neurological: He is alert and oriented to person, place, and time.  Skin: Skin is warm and dry.  Psychiatric: He has a normal mood and affect. His behavior is normal. Judgment and thought content normal.    ED Course  Procedures (including critical care time) Labs Review Labs Reviewed - No data to display Imaging Review Dg Chest 2 View  02/07/2014   CLINICAL DATA:  Two week history of cough and dyspnea, long-term smoking history  EXAM: CHEST  2 VIEW  COMPARISON:  DG ABD ACUTE W/CHEST dated 11/18/2013  FINDINGS: The lungs are well-expanded. There is no focal infiltrate. There is minimal stable prominence of the pulmonary interstitial markings. The cardiac silhouette is normal in size. The pulmonary vascularity is not engorged. The mediastinum is normal in width. There is no pleural effusion. There are surgical clips in the region of the GE junction. The bony thorax exhibits no acute abnormality.  IMPRESSION: There is mild hyperinflation consistent with COPD. There is no evidence of pneumonia nor CHF or other acute cardiopulmonary abnormality.   Electronically Signed   By: David  Martinique   On: 02/07/2014 10:34    EKG Interpretation   None       MDM   Final diagnoses:  Cough    Pt CXR negative for acute infiltrate. Patients symptoms are consistent with URI, likely viral etiology. Pt will be discharged with symptomatic treatment. He has COPD, will add on azithromycin. Verbalizes understanding and is agreeable with plan. Pt is hemodynamically stable & in NAD prior to  dc.     Montine Circle, PA-C 02/07/14 1057

## 2014-02-07 NOTE — ED Notes (Signed)
Pt states cough and congestion x 2 weeks.  Pt states no fever.  Sputum positive and yellow.

## 2014-02-13 ENCOUNTER — Encounter (INDEPENDENT_AMBULATORY_CARE_PROVIDER_SITE_OTHER): Payer: Medicaid Other | Admitting: Surgery

## 2014-03-05 ENCOUNTER — Ambulatory Visit (INDEPENDENT_AMBULATORY_CARE_PROVIDER_SITE_OTHER): Payer: Medicaid Other | Admitting: Surgery

## 2014-03-26 ENCOUNTER — Ambulatory Visit (INDEPENDENT_AMBULATORY_CARE_PROVIDER_SITE_OTHER): Payer: Medicaid Other | Admitting: Surgery

## 2014-06-09 DIAGNOSIS — R45850 Homicidal ideations: Secondary | ICD-10-CM | POA: Insufficient documentation

## 2014-06-09 DIAGNOSIS — Z8719 Personal history of other diseases of the digestive system: Secondary | ICD-10-CM | POA: Insufficient documentation

## 2014-06-09 DIAGNOSIS — Z8679 Personal history of other diseases of the circulatory system: Secondary | ICD-10-CM | POA: Insufficient documentation

## 2014-06-09 DIAGNOSIS — F17200 Nicotine dependence, unspecified, uncomplicated: Secondary | ICD-10-CM | POA: Insufficient documentation

## 2014-06-09 DIAGNOSIS — Z8619 Personal history of other infectious and parasitic diseases: Secondary | ICD-10-CM | POA: Insufficient documentation

## 2014-06-10 ENCOUNTER — Encounter (HOSPITAL_COMMUNITY): Payer: Self-pay | Admitting: Emergency Medicine

## 2014-06-10 ENCOUNTER — Emergency Department (HOSPITAL_COMMUNITY)
Admission: EM | Admit: 2014-06-10 | Discharge: 2014-06-10 | Disposition: A | Discharge status: Home / Self Care | Payer: Medicaid Other | Attending: Emergency Medicine | Admitting: Emergency Medicine

## 2014-06-10 DIAGNOSIS — K7460 Unspecified cirrhosis of liver: Secondary | ICD-10-CM | POA: Diagnosis present

## 2014-06-10 DIAGNOSIS — Z8719 Personal history of other diseases of the digestive system: Secondary | ICD-10-CM

## 2014-06-10 DIAGNOSIS — F1094 Alcohol use, unspecified with alcohol-induced mood disorder: Secondary | ICD-10-CM | POA: Diagnosis present

## 2014-06-10 DIAGNOSIS — D649 Anemia, unspecified: Secondary | ICD-10-CM

## 2014-06-10 DIAGNOSIS — R601 Generalized edema: Secondary | ICD-10-CM

## 2014-06-10 DIAGNOSIS — S065X9A Traumatic subdural hemorrhage with loss of consciousness of unspecified duration, initial encounter: Secondary | ICD-10-CM

## 2014-06-10 DIAGNOSIS — Z9889 Other specified postprocedural states: Secondary | ICD-10-CM

## 2014-06-10 DIAGNOSIS — F10929 Alcohol use, unspecified with intoxication, unspecified: Secondary | ICD-10-CM | POA: Diagnosis present

## 2014-06-10 DIAGNOSIS — R740 Nonspecific elevation of levels of transaminase and lactic acid dehydrogenase [LDH]: Secondary | ICD-10-CM

## 2014-06-10 DIAGNOSIS — R7401 Elevation of levels of liver transaminase levels: Secondary | ICD-10-CM

## 2014-06-10 DIAGNOSIS — F1994 Other psychoactive substance use, unspecified with psychoactive substance-induced mood disorder: Secondary | ICD-10-CM

## 2014-06-10 DIAGNOSIS — F17200 Nicotine dependence, unspecified, uncomplicated: Secondary | ICD-10-CM

## 2014-06-10 DIAGNOSIS — D696 Thrombocytopenia, unspecified: Secondary | ICD-10-CM

## 2014-06-10 DIAGNOSIS — E43 Unspecified severe protein-calorie malnutrition: Secondary | ICD-10-CM

## 2014-06-10 DIAGNOSIS — F10920 Alcohol use, unspecified with intoxication, uncomplicated: Secondary | ICD-10-CM

## 2014-06-10 DIAGNOSIS — R109 Unspecified abdominal pain: Secondary | ICD-10-CM

## 2014-06-10 DIAGNOSIS — E876 Hypokalemia: Secondary | ICD-10-CM

## 2014-06-10 DIAGNOSIS — F10921 Alcohol use, unspecified with intoxication delirium: Secondary | ICD-10-CM

## 2014-06-10 DIAGNOSIS — Z22322 Carrier or suspected carrier of Methicillin resistant Staphylococcus aureus: Secondary | ICD-10-CM

## 2014-06-10 DIAGNOSIS — F1010 Alcohol abuse, uncomplicated: Secondary | ICD-10-CM

## 2014-06-10 DIAGNOSIS — K56600 Partial intestinal obstruction, unspecified as to cause: Secondary | ICD-10-CM

## 2014-06-10 DIAGNOSIS — F329 Major depressive disorder, single episode, unspecified: Secondary | ICD-10-CM

## 2014-06-10 DIAGNOSIS — D61818 Other pancytopenia: Secondary | ICD-10-CM

## 2014-06-10 DIAGNOSIS — K7682 Hepatic encephalopathy: Secondary | ICD-10-CM

## 2014-06-10 DIAGNOSIS — S065XAA Traumatic subdural hemorrhage with loss of consciousness status unknown, initial encounter: Secondary | ICD-10-CM

## 2014-06-10 DIAGNOSIS — K7290 Hepatic failure, unspecified without coma: Secondary | ICD-10-CM

## 2014-06-10 DIAGNOSIS — E871 Hypo-osmolality and hyponatremia: Secondary | ICD-10-CM

## 2014-06-10 DIAGNOSIS — R45851 Suicidal ideations: Secondary | ICD-10-CM

## 2014-06-10 DIAGNOSIS — F32A Depression, unspecified: Secondary | ICD-10-CM

## 2014-06-10 DIAGNOSIS — R45850 Homicidal ideations: Secondary | ICD-10-CM

## 2014-06-10 DIAGNOSIS — R188 Other ascites: Secondary | ICD-10-CM

## 2014-06-10 LAB — ETHANOL: ALCOHOL ETHYL (B): 187 mg/dL — AB (ref 0–11)

## 2014-06-10 LAB — CBC WITH DIFFERENTIAL/PLATELET
BASOS PCT: 1 % (ref 0–1)
Basophils Absolute: 0.1 10*3/uL (ref 0.0–0.1)
EOS PCT: 5 % (ref 0–5)
Eosinophils Absolute: 0.3 10*3/uL (ref 0.0–0.7)
HCT: 37.2 % — ABNORMAL LOW (ref 39.0–52.0)
HEMOGLOBIN: 13.4 g/dL (ref 13.0–17.0)
LYMPHS ABS: 1.7 10*3/uL (ref 0.7–4.0)
Lymphocytes Relative: 34 % (ref 12–46)
MCH: 34.4 pg — AB (ref 26.0–34.0)
MCHC: 36 g/dL (ref 30.0–36.0)
MCV: 95.4 fL (ref 78.0–100.0)
Monocytes Absolute: 0.5 10*3/uL (ref 0.1–1.0)
Monocytes Relative: 10 % (ref 3–12)
NEUTROS PCT: 50 % (ref 43–77)
Neutro Abs: 2.5 10*3/uL (ref 1.7–7.7)
Platelets: 55 10*3/uL — ABNORMAL LOW (ref 150–400)
RBC: 3.9 MIL/uL — AB (ref 4.22–5.81)
RDW: 15.7 % — ABNORMAL HIGH (ref 11.5–15.5)
WBC: 5.1 10*3/uL (ref 4.0–10.5)

## 2014-06-10 LAB — COMPREHENSIVE METABOLIC PANEL
ALT: 60 U/L — ABNORMAL HIGH (ref 0–53)
AST: 107 U/L — ABNORMAL HIGH (ref 0–37)
Albumin: 3.3 g/dL — ABNORMAL LOW (ref 3.5–5.2)
Alkaline Phosphatase: 81 U/L (ref 39–117)
BUN: 8 mg/dL (ref 6–23)
CALCIUM: 9.1 mg/dL (ref 8.4–10.5)
CO2: 19 meq/L (ref 19–32)
CREATININE: 1.06 mg/dL (ref 0.50–1.35)
Chloride: 107 mEq/L (ref 96–112)
GFR, EST AFRICAN AMERICAN: 88 mL/min — AB (ref >90)
GFR, EST NON AFRICAN AMERICAN: 76 mL/min — AB (ref >90)
GLUCOSE: 89 mg/dL (ref 70–99)
Potassium: 3.9 mEq/L (ref 3.7–5.3)
Sodium: 142 mEq/L (ref 137–147)
TOTAL PROTEIN: 7.4 g/dL (ref 6.0–8.3)
Total Bilirubin: 1.8 mg/dL — ABNORMAL HIGH (ref 0.3–1.2)

## 2014-06-10 LAB — SALICYLATE LEVEL: Salicylate Lvl: <2 mg/dL — ABNORMAL LOW (ref 2.8–20.0)

## 2014-06-10 LAB — AMMONIA: Ammonia: 94 umol/L — ABNORMAL HIGH (ref 11–60)

## 2014-06-10 LAB — RAPID URINE DRUG SCREEN, HOSP PERFORMED
Amphetamines: NOT DETECTED
BENZODIAZEPINES: NOT DETECTED
Barbiturates: NOT DETECTED
COCAINE: NOT DETECTED
OPIATES: NOT DETECTED
Tetrahydrocannabinol: NOT DETECTED

## 2014-06-10 LAB — ACETAMINOPHEN LEVEL: Acetaminophen (Tylenol), Serum: <15 ug/mL (ref 10–30)

## 2014-06-10 MED ORDER — ONDANSETRON HCL 4 MG PO TABS: 4.0000 mg | ORAL_TABLET | Freq: Three times a day (TID) | ORAL | Status: DC | PRN

## 2014-06-10 MED ORDER — NICOTINE 21 MG/24HR TD PT24: 21.0000 mg | MEDICATED_PATCH | Freq: Every day | TRANSDERMAL | Status: DC

## 2014-06-10 MED ORDER — ZOLPIDEM TARTRATE 5 MG PO TABS: 5.0000 mg | ORAL_TABLET | Freq: Every evening | ORAL | Status: DC | PRN

## 2014-06-10 MED ORDER — ALUM & MAG HYDROXIDE-SIMETH 200-200-20 MG/5ML PO SUSP: 30.0000 mL | ORAL | Status: DC | PRN

## 2014-06-10 MED ORDER — LORAZEPAM 1 MG PO TABS
1.0000 mg | ORAL_TABLET | Freq: Three times a day (TID) | ORAL | Status: DC | PRN
  Administered 2014-06-10: 1 mg via ORAL
  Filled 2014-06-10: qty 1

## 2014-06-10 MED ORDER — IBUPROFEN 200 MG PO TABS
600.0000 mg | ORAL_TABLET | Freq: Three times a day (TID) | ORAL | Status: DC | PRN
  Administered 2014-06-10: 600 mg via ORAL
  Filled 2014-06-10: qty 3

## 2014-06-10 NOTE — ED Notes (Signed)
Pt is resting quietly, sitting on hall stretcher. He states he is waiting for discharge. Declined nicoderm patch. In no acute distress.

## 2014-06-10 NOTE — ED Provider Notes (Signed)
Medical screening examination/treatment/procedure(s) were performed by non-physician practitioner and as supervising physician I was immediately available for consultation/collaboration.   EKG Interpretation None        Mariea Clonts, MD 06/10/14 [phone]
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
## 2014-06-10 NOTE — Discharge Instructions (Signed)
Finding Treatment for Alcohol and Drug Addiction It can be hard to find the right place to get professional treatment. Here are some important things to consider:  There are different types of treatment to choose from.  Some programs are live-in (residential) while others are not (outpatient). Sometimes a combination is offered.  No single type of program is right for everyone.  Most treatment programs involve a combination of education, counseling, and a 12-step, spiritually-based approach.  There are non-spiritually based programs (not 12-step).  Some treatment programs are government sponsored. They are geared for patients without private insurance.  Treatment programs can vary in many respects such as:  Cost and types of insurance accepted.  Types of on-site medical services offered.  Length of stay, setting, and size.  Overall philosophy of treatment. A person may need specialized treatment or have needs not addressed by all programs. For example, adolescents need treatment appropriate for their age. Other people have secondary disorders that must be managed as well. Secondary conditions can include mental illness, such as depression or diabetes. Often, a period of detoxification from alcohol or drugs is needed. This requires medical supervision and not all programs offer this. THINGS TO CONSIDER WHEN SELECTING A TREATMENT PROGRAM   Is the program certified by the appropriate government agency? Even private programs must be certified and employ certified professionals.  Does the program accept your insurance? If not, can a payment plan be set up?  Is the facility clean, organized, and well run? Do they allow you to speak with graduates who can share their treatment experience with you? Can you tour the facility? Can you meet with staff?  Does the program meet the full range of individual needs?  Does the treatment program address sexual orientation and physical disabilities?  Do they provide age, gender, and culturally appropriate treatment services?  Is treatment available in languages other than English?  Is long-term aftercare support or guidance encouraged and provided?  Is assessment of an individual's treatment plan ongoing to ensure it meets changing needs?  Does the program use strategies to encourage reluctant patients to remain in treatment long enough to increase the likelihood of success?  Does the program offer counseling (individual or group) and other behavioral therapies?  Does the program offer medicine as part of the treatment regimen, if needed?  Is there ongoing monitoring of possible relapse? Is there a defined relapse prevention program? Are services or referrals offered to family members to ensure they understand addiction and the recovery process? This would help them support the recovering individual.  Are 12-step meetings held at the center or is transport available for patients to attend outside meetings? In countries outside of the [place] and [place], [employer], minerals for contact information for services in your area. Document Released: 10/29/2005 Document Revised: 02/22/2012 Document Reviewed: 05/10/2008 [facility] Patient Information 2015 [place], [place]. This information is not intended to replace advice given to you by your health care provider. Make sure you discuss any questions you have with your health care provider.  Alcohol and Nutrition Nutrition serves two purposes. It provides energy. It also maintains body structure and function. Food supplies energy. It also provides the building blocks needed to replace worn or damaged cells. Alcoholics often eat poorly. This limits their supply of essential nutrients. This affects energy supply and structure maintenance. Alcohol also affects the body's nutrients in:  Digestion.  Storage.  Using and getting rid of waste products. IMPAIRMENT OF NUTRIENT DIGESTION AND UTILIZATION  Once ingested, food must be broken down into small components (digested). Then it is available for energy. It helps maintain body structure and function. Digestion begins in the mouth. It continues in the stomach and intestines, with help from the pancreas. The nutrients from digested food are absorbed from the intestines into the blood. Then they are carried to the liver. The liver prepares nutrients for:  Immediate use.  Storage and future use.  Alcohol inhibits the breakdown of nutrients into usable molecules.  It decreases secretion of digestive enzymes from the pancreas.  Alcohol impairs nutrient absorption by damaging the cells lining the stomach and intestines.  It also interferes with moving some nutrients into the blood.  In addition, nutritional deficiencies themselves may lead to further absorption problems.  For example, folate deficiency changes the cells that line the small intestine. This impairs how water is absorbed. It also affects absorbed nutrients. These include glucose, sodium, and additional folate.  Even if nutrients are digested and absorbed, alcohol can prevent them from being fully used. It changes their transport, storage, and excretion. Impaired utilization of nutrients by alcoholics is indicated by:  Decreased liver stores of vitamins, such as vitamin A.  Increased excretion of nutrients such as fat. ALCOHOL AND ENERGY SUPPLY   Three basic nutritional components found in food are:  Carbohydrates.  Proteins.  Fats.  These are used as energy. Some alcoholics take in as much as 50% of their total daily calories from alcohol. They often neglect important foods.  Even when enough food is eaten, alcohol can impair the ways the body controls blood sugar (glucose) levels. It may either increase or decrease blood sugar.  In non-diabetic alcoholics, increased blood sugar (hyperglycemia) is caused by poor insulin secretion. It is usually  temporary.  Decreased blood sugar (hypoglycemia) can cause serious injury even if this condition is short-lived. Low blood sugar can happen when a fasting or malnourished person drinks alcohol. When there is no food to supply energy, stored sugar is used up. The products of alcohol inhibit forming glucose from other compounds such as amino acids. As a result, alcohol causes the brain and other body tissue to lack glucose. It is needed for energy and function.  Alcohol is an energy source. But how the body processes and uses the energy from alcohol is complex. Also, when alcohol is substituted for carbohydrates, subjects tend to lose weight. This indicates that they get less energy from alcohol than from food. ALCOHOL - MAINTAINING CELL STRUCTURE AND FUNCTION  Structure Cells are made mostly of protein. So an adequate protein diet is important for maintaining cell structure. This is especially true if cells are being damaged. Research indicates that alcohol affects protein nutrition by causing impaired:  Digestion of proteins to amino acids.  Processing of amino acids by the small intestine and liver.  Synthesis of proteins from amino acids.  Protein secretion by the liver. Function Nutrients are essential for the body to function well. They provide the tools that the body needs to work well:   Proteins.  Vitamins.  Minerals. Alcohol can disrupt body function. It may cause nutrient deficiencies. And it may interfere with the way nutrients are processed. Vitamins  Vitamins are essential to maintain growth and normal metabolism. They regulate many of the body`s processes. Chronic heavy drinking causes deficiencies in many vitamins. This is caused by eating less. And, in some cases, vitamins may be poorly absorbed. For example, alcohol inhibits fat absorption. It impairs how the vitamins A,  E, and D are normally absorbed along with dietary fats. Not enough vitamin A may cause night blindness.  Not enough vitamin D may cause softening of the bones.  Some alcoholics lack vitamins A, C, D, E, K, and the B vitamins. These are all involved in wound healing and cell maintenance. In particular, because vitamin K is necessary for blood clotting, lacking that vitamin can cause delayed clotting. The result is excess bleeding. Lacking other vitamins involved in brain function may cause severe neurological damage. Minerals Deficiencies of minerals such as calcium, magnesium, iron, and zinc are common in alcoholics. The alcohol itself does not seem to affect how these minerals are absorbed. Rather, they seem to occur secondary to other alcohol-related problems, such as:  Less calcium absorbed.  Not enough magnesium.  More urinary excretion.  Vomiting.  Diarrhea.  Not enough iron due to gastrointestinal bleeding.  Not enough zinc or losses related to other nutrient deficiencies.  Mineral deficiencies can cause a variety of medical consequences. These range from calcium-related bone disease to zinc-related night blindness and skin lesions. ALCOHOL, MALNUTRITION, AND MEDICAL COMPLICATIONS  Liver Disease   Alcoholic liver damage is caused primarily by alcohol itself. But poor nutrition may increase the risk of alcohol-related liver damage. For example, nutrients normally found in the liver are known to be affected by drinking alcohol. These include carotenoids, which are the major sources of vitamin A, and vitamin E compounds. Decreases in such nutrients may play some role in alcohol-related liver damage. Pancreatitis  Research suggests that malnutrition may increase the risk of developing alcoholic pancreatitis. Research suggests that a diet lacking in protein may increase alcohol's damaging effect on the pancreas. Brain  Nutritional deficiencies may have severe effects on brain function. These may be permanent. Specifically, thiamine deficiencies are often seen in alcoholics. They can cause  severe neurological problems. These include:  Impaired movement.  Memory loss seen in Wernicke-Korsakoff syndrome. Pregnancy  Alcohol has toxic effects on fetal development. It causes alcohol-related birth defects. They include fetal alcohol syndrome. Alcohol itself is toxic to the fetus. Also, the nutritional deficiency can affect how the fetus develops. That may compound the risk of developmental damage.  Nutritional needs during pregnancy are 10% to 30% greater than normal. Food intake can increase by as much as 140% to cover the needs of both mother and fetus. An alcoholic mother`s nutritional problems may adversely affect the nutrition of the fetus. And alcohol itself can also restrict nutrition flow to the fetus. NUTRITIONAL STATUS OF ALCOHOLICS  Techniques for assessing nutritional status include:  Taking body measurements to estimate fat reserves. They include:  Weight.  Height.  Mass.  Skin fold thickness.  Performing blood analysis to provide measurements of circulating:  Proteins.  Vitamins.  Minerals.  These techniques tend to be imprecise. For many nutrients, there is no clear "cut-off" point that would allow an accurate definition of deficiency. So assessing the nutritional status of alcoholics is limited by these techniques. Dietary status may provide information about the risk of developing nutritional problems. Dietary status is assessed by:  Taking patients' dietary histories.  Evaluating the amount and types of food they are eating.  It is difficult to determine what exact amount of alcohol begins to have damaging effects on nutrition. In general, moderate drinkers have 2 drinks or less per day. They seem to be at little risk for nutritional problems. Various medical disorders begin to appear at greater levels.  Research indicates that the majority of even the  heaviest drinkers have few obvious nutritional deficiencies. Many alcoholics who are hospitalized  for medical complications of their disease do have severe malnutrition. Alcoholics tend to eat poorly. Often they eat less than the amounts of food necessary to provide enough:  Carbohydrates.  Protein.  Fat.  Vitamins A and C.  B vitamins.  Minerals like calcium and iron. Of major concern is alcohol's effect on digesting food and use of nutrients. It may shift a mildly malnourished person toward severe malnutrition. Document Released: 09/24/2005 Document Revised: 02/22/2012 Document Reviewed: 03/10/2006 [facility] Patient Information 2015 [place], [place]. This information is not intended to replace advice given to you by your health care provider. Make sure you discuss any questions you have with your health care provider.

## 2014-06-10 NOTE — ED Notes (Signed)
Pt attempting to leave ED, pt unwilling to stay, [place] PA notified, IVC papers initiated [facility] at bedside.

## 2014-06-10 NOTE — ED Notes (Signed)
Telepsych in progress. 

## 2014-06-10 NOTE — BH Assessment (Signed)
Assessment complete. Per Lavell Luster, [facility] at [facility], adult unit is at capacity. Consulted with Serena Colonel, NP who agrees Pt meets criteria for inpatient treatment and Pt should be placed under IVC if he attempts to leave. TTS will contact other facilities for placement. Communicated recommendations to Antonietta Breach, PA-C who said IVC has been initiated.   Orpah Greek Rosana Hoes, [facility] Triage Specialist [phone]

## 2014-06-10 NOTE — ED Notes (Signed)
Pt now resting on stretcher RR even and unlabored, NAD.
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
## 2014-06-10 NOTE — Progress Notes (Signed)
Referrals Faxed to the following facilities: [facility]  [facility]
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
## 2014-06-10 NOTE — ED Notes (Signed)
Pt now changed into scrubs, apologized for attempting to leave. Pt medicated for anxiety

## 2014-06-10 NOTE — [facility] Suicide Risk Assessment (Cosign Needed)
Suicide Risk Assessment  Discharge Assessment     Demographic Factors:  Male and Caucasian  Total Time spent with patient: 20 minutes Psychiatric Specialty Exam:      Blood pressure 129/69, pulse 81, temperature 98.1 F (36.7 C), temperature source Oral, resp. rate 20, height 5\' 11"  (1.803 m), weight 89.812 kg (198 lb), SpO2 100.00%.Body mass index is 27.63 kg/(m^2).   General Appearance: Casual   Eye Contact:: Good   Speech: Clear and Coherent and Normal Rate   Volume: Normal   Mood: Anxious   Affect: Congruent   Thought Process: Circumstantial   Orientation: Full (Time, Place, and Person)   Thought Content: Rumination   Suicidal Thoughts: No   Homicidal Thoughts: No   Memory: Immediate; Good  Recent; Good  Remote; Good   Judgement: Fair   Insight: Present   Psychomotor Activity: Normal   Concentration: Fair   Recall: Good   Fund of Knowledge:Good   Language: Good   Akathisia: No   Handed: Right   AIMS (if indicated):   Assets: Communication Skills  Desire for Improvement  Housing  Social Support   Sleep:   Musculoskeletal:  Strength & Muscle Tone: within normal limits  Gait & Station: normal  Patient leans: N/A    Mental Status Per Nursing Assessment::   On Admission:     Current Mental Status by Physician: Patient denies suicidal/homicidal ideation, psychosis, and paranoia  Loss Factors: NA  Historical Factors: NA  Risk Reduction Factors:   Religious beliefs about death  Continued Clinical Symptoms:  Alcohol/Substance Abuse/Dependencies  Cognitive Features That Contribute To Risk:  None noted    Suicide Risk:  Minimal: No identifiable suicidal ideation.  Patients presenting with no risk factors but with morbid ruminations; may be classified as minimal risk based on the severity of the depressive symptoms  Discharge Diagnoses:  AXIS I: Alcohol Abuse and Substance Induced Mood Disorder  AXIS II: Deferred  AXIS III:  Past Medical History    Diagnosis  Date   .  Cirrhosis of liver    .  Hepatitis C  03-28-12     ? 80's-prev. IV drug abuse   .  Occasional tremors  03-28-12     tremors"essential"- heriditary   .  Blood transfusion  03-28-12     '81-hx. of bleeding ulcer   .  History of bleeding ulcers  03-28-12     Hx. '81   .  GERD (gastroesophageal reflux disease)  03-26-12     Hx. reflux. ulcers in past, occ. OTC med   .  Incisional hernia      has drainage system from abdomin   .  Subdural hematoma  12/13/12    AXIS IV: other psychosocial or environmental problems  AXIS V: 61-70 mild symptoms   Plan Of Care/Follow-up recommendations:  Activity:  Resume usual activity Diet:  Resume usual diet  Is patient on multiple antipsychotic therapies at discharge:  No   Has Patient had three or more failed trials of antipsychotic monotherapy by history:  No  Recommended Plan for Multiple Antipsychotic Therapies: NA    Rankin, Shuvon, FNP-BC 06/10/2014, 3:02 PM

## 2014-07-03 ENCOUNTER — Emergency Department (HOSPITAL_COMMUNITY)
Admission: EM | Admit: 2014-07-03 | Discharge: 2014-07-04 | Disposition: A | Discharge status: Home / Self Care | Payer: Medicaid Other | Attending: Emergency Medicine | Admitting: Emergency Medicine

## 2014-07-03 ENCOUNTER — Emergency Department (HOSPITAL_COMMUNITY): Payer: Medicaid Other

## 2014-07-03 ENCOUNTER — Encounter (HOSPITAL_COMMUNITY): Payer: Self-pay | Admitting: Emergency Medicine

## 2014-07-03 DIAGNOSIS — K8020 Calculus of gallbladder without cholecystitis without obstruction: Secondary | ICD-10-CM | POA: Diagnosis not present

## 2014-07-03 DIAGNOSIS — R141 Gas pain: Secondary | ICD-10-CM | POA: Diagnosis not present

## 2014-07-03 DIAGNOSIS — K439 Ventral hernia without obstruction or gangrene: Secondary | ICD-10-CM | POA: Insufficient documentation

## 2014-07-03 DIAGNOSIS — R142 Eructation: Secondary | ICD-10-CM

## 2014-07-03 DIAGNOSIS — Z791 Long term (current) use of non-steroidal anti-inflammatories (NSAID): Secondary | ICD-10-CM | POA: Insufficient documentation

## 2014-07-03 DIAGNOSIS — Z8619 Personal history of other infectious and parasitic diseases: Secondary | ICD-10-CM | POA: Insufficient documentation

## 2014-07-03 DIAGNOSIS — F17200 Nicotine dependence, unspecified, uncomplicated: Secondary | ICD-10-CM | POA: Diagnosis not present

## 2014-07-03 DIAGNOSIS — R109 Unspecified abdominal pain: Secondary | ICD-10-CM | POA: Diagnosis present

## 2014-07-03 DIAGNOSIS — R143 Flatulence: Secondary | ICD-10-CM

## 2014-07-03 LAB — CBC WITH DIFFERENTIAL/PLATELET
Basophils Absolute: 0 10*3/uL (ref 0.0–0.1)
Basophils Relative: 0 % (ref 0–1)
Eosinophils Absolute: 0.1 10*3/uL (ref 0.0–0.7)
Eosinophils Relative: 4 % (ref 0–5)
HCT: 34.4 % — ABNORMAL LOW (ref 39.0–52.0)
Hemoglobin: 11.9 g/dL — ABNORMAL LOW (ref 13.0–17.0)
LYMPHS ABS: 0.8 10*3/uL (ref 0.7–4.0)
LYMPHS PCT: 30 % (ref 12–46)
MCH: 34 pg (ref 26.0–34.0)
MCHC: 34.6 g/dL (ref 30.0–36.0)
MCV: 98.3 fL (ref 78.0–100.0)
Monocytes Absolute: 0.4 10*3/uL (ref 0.1–1.0)
Monocytes Relative: 14 % — ABNORMAL HIGH (ref 3–12)
NEUTROS PCT: 52 % (ref 43–77)
Neutro Abs: 1.4 10*3/uL — ABNORMAL LOW (ref 1.7–7.7)
PLATELETS: 43 10*3/uL — AB (ref 150–400)
RBC: 3.5 MIL/uL — AB (ref 4.22–5.81)
RDW: 15.4 % (ref 11.5–15.5)
WBC: 2.6 10*3/uL — AB (ref 4.0–10.5)

## 2014-07-03 LAB — COMPREHENSIVE METABOLIC PANEL
ALK PHOS: 68 U/L (ref 39–117)
ALT: 46 U/L (ref 0–53)
AST: 92 U/L — ABNORMAL HIGH (ref 0–37)
Albumin: 3 g/dL — ABNORMAL LOW (ref 3.5–5.2)
Anion gap: 10 (ref 5–15)
BUN: 16 mg/dL (ref 6–23)
CO2: 23 meq/L (ref 19–32)
Calcium: 8.6 mg/dL (ref 8.4–10.5)
Chloride: 110 mEq/L (ref 96–112)
Creatinine, Ser: 1.08 mg/dL (ref 0.50–1.35)
GFR, EST AFRICAN AMERICAN: 86 mL/min — AB (ref >90)
GFR, EST NON AFRICAN AMERICAN: 74 mL/min — AB (ref >90)
Glucose, Bld: 87 mg/dL (ref 70–99)
POTASSIUM: 3.7 meq/L (ref 3.7–5.3)
SODIUM: 143 meq/L (ref 137–147)
Total Bilirubin: 2 mg/dL — ABNORMAL HIGH (ref 0.3–1.2)
Total Protein: 6.5 g/dL (ref 6.0–8.3)

## 2014-07-03 LAB — I-STAT CG4 LACTIC ACID, ED: Lactic Acid, Venous: 1.86 mmol/L (ref 0.5–2.2)

## 2014-07-03 MED ORDER — HYDROMORPHONE HCL PF 1 MG/ML IJ SOLN
1.0000 mg | Freq: Once | INTRAMUSCULAR | Status: AC
  Administered 2014-07-03: 1 mg via INTRAVENOUS
  Filled 2014-07-03: qty 1

## 2014-07-03 MED ORDER — IOHEXOL 300 MG/ML  SOLN
100.0000 mL | Freq: Once | INTRAMUSCULAR | Status: AC | PRN
  Administered 2014-07-03: 100 mL via INTRAVENOUS

## 2014-07-03 MED ORDER — MORPHINE SULFATE 4 MG/ML IJ SOLN
4.0000 mg | Freq: Once | INTRAMUSCULAR | Status: AC
  Administered 2014-07-03: 4 mg via INTRAVENOUS
  Filled 2014-07-03: qty 1

## 2014-07-03 MED ORDER — ONDANSETRON HCL 4 MG/2ML IJ SOLN
4.0000 mg | Freq: Once | INTRAMUSCULAR | Status: AC
Start: 2014-07-03 — End: 2014-07-03
  Administered 2014-07-03: 4 mg via INTRAVENOUS
  Filled 2014-07-03: qty 2

## 2014-07-03 NOTE — ED Notes (Signed)
Bedside report received from previous RN, Martha. 

## 2014-07-03 NOTE — ED Provider Notes (Signed)
CSN: [id number]     Arrival date & time 07/03/14  1631 History   First MD Initiated Contact with Patient 07/03/14 1836     Chief Complaint  Patient presents with  . Hernia     (Consider location/radiation/quality/duration/timing/severity/associated sxs/prior Treatment) HPI Comments: Patient is a 58 year old male with history of liver cirrhosis, hepatitis C, GERD, hernia who presents today with pain coming from his hernia. He reports that he has had this hernia for many years. Dr. Ninfa Linden operated on this hernia approximately 2 years ago where he placed mesh. The mesh became infected and was removed. He has not had issues since that time. He states that today while he was working on the roof of his hernia popped out. He has been unable to reduce the hernia. He had a normal bowel movement today. He can still pass gas. No nausea or vomiting. He cannot describe the pain other than saying it is severe. No fevers, chills, shortness of breath, chest pain.  The history is provided by the patient. No language interpreter was used.    Past Medical History  Diagnosis Date  . Cirrhosis of liver   . Hepatitis C 03-28-12    ? 80's-prev. IV drug abuse  . 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 Blood transfusion 03-28-12    '81-hx. of bleeding ulcer  . History of bleeding ulcers 03-28-12    Hx. '81  . GERD (gastroesophageal reflux disease) 03-26-12    Hx. reflux. ulcers in past, occ. OTC med  . Incisional hernia     has drainage system from abdomin  . Subdural hematoma 12/13/12   Past Surgical History  Procedure Laterality Date  . Umbilical hernia repair    . Hernia repair  08/24/11    ventral hernia with mesh  . Hernia repair  51'76    Umbilical  . Peptic ulcer  03-26-12    open peptic ulcer surgery repair- '81  . Incisional hernia repair  03/26/2012  . Incisional hernia repair  03/30/2012    Procedure: LAPAROSCOPIC INCISIONAL HERNIA;  Surgeon: Harl Bowie, MD;   Location: [facility];  Service: General;  Laterality: N/A;  Laparoscopic Incisional/ventral Hernia Repair with Mesh  . Tonsillectomy    . Appendectomy    . Incisional hernia repair  07/20/2012    mesh  . Incisional hernia repair  07/20/2012    Procedure: HERNIA REPAIR INCISIONAL;  Surgeon: Harl Bowie, MD;  Location: [place];  Service: General;  Laterality: N/A;  repair of incisional hernia with Biologic mesh   Family History  Problem Relation Age of Onset  . Cancer Mother     breast  . Cancer Father     brain  . Heart disease Brother   . Cancer Sister     breast  . Cancer Sister     breast   History  Substance Use Topics  . Smoking status: Current Every Day Smoker -- 0.25 packs/day for 38 years    Types: Cigarettes  . Smokeless tobacco: Never Used  . Alcohol Use: 21.0 oz/week    35 Cans of beer per week     Comment: 6 beers a week per pt    Review of Systems  Constitutional: Negative for fever and chills.  Respiratory: Negative for shortness of breath.   Cardiovascular: Negative for chest pain.  Gastrointestinal: Positive for abdominal pain and abdominal distention. Negative for nausea, vomiting, diarrhea and constipation.  All other systems reviewed and are negative.  Allergies  Review of patient's allergies indicates no known allergies.  Home Medications   Prior to Admission medications   Medication Sig Start Date End Date Taking? Authorizing Provider  ibuprofen (ADVIL,MOTRIN) 200 MG tablet Take 400 mg by mouth every 6 (six) hours as needed for headache or moderate pain.    Yes Historical Provider, MD  Multiple Vitamin (MULITIVITAMIN WITH MINERALS) TABS Take 1 tablet by mouth daily.   Yes Historical Provider, MD  naproxen sodium (ANAPROX) 220 MG tablet Take 440 mg by mouth 2 (two) times daily with a meal.   Yes Historical Provider, MD   BP 146/65  Pulse 62  Temp(Src) 97.9 F (36.6 C) (Oral)  Resp 20  SpO2 95% Physical Exam  Nursing note and vitals  reviewed. Constitutional: He is oriented to person, place, and time. He appears well-developed and well-nourished. No distress.  HENT:  Head: Normocephalic and atraumatic.  Right Ear: External ear normal.  Left Ear: External ear normal.  Nose: Nose normal.  Eyes: Conjunctivae are normal.  Neck: Normal range of motion. No tracheal deviation present.  Cardiovascular: Normal rate, regular rhythm and normal heart sounds.   Pulmonary/Chest: Effort normal and breath sounds normal. No stridor.  Abdominal: Soft. Bowel sounds are normal. He exhibits no distension. There is generalized tenderness. There is guarding (voluntary). A hernia is present.    Large scar on abdominal wall. No erythema.   Musculoskeletal: Normal range of motion.  Neurological: He is alert and oriented to person, place, and time.  Skin: Skin is warm and dry. He is not diaphoretic.  Psychiatric: He has a normal mood and affect. His behavior is normal.    ED Course  Procedures (including critical care time) Labs Review Labs Reviewed  CBC WITH DIFFERENTIAL - Abnormal; Notable for the following:    WBC 2.6 (*)    RBC 3.50 (*)    Hemoglobin 11.9 (*)    HCT 34.4 (*)    Platelets 43 (*)    Neutro Abs 1.4 (*)    Monocytes Relative 14 (*)    All other components within normal limits  COMPREHENSIVE METABOLIC PANEL - Abnormal; Notable for the following:    Albumin 3.0 (*)    AST 92 (*)    Total Bilirubin 2.0 (*)    GFR calc non Af Amer 74 (*)    GFR calc Af Amer 86 (*)    All other components within normal limits  LIPASE, BLOOD  I-STAT CG4 LACTIC ACID, ED    Imaging Review Ct Abdomen Pelvis W Contrast  07/03/2014   CLINICAL DATA:  Mid abdominal pain.  History of abdominal hernia.  EXAM: CT ABDOMEN AND PELVIS WITH CONTRAST  TECHNIQUE: Multidetector CT imaging of the abdomen and pelvis was performed using the standard protocol following bolus administration of intravenous contrast.  CONTRAST:  170mL OMNIPAQUE IOHEXOL  300 MG/ML  SOLN  COMPARISON:  CT of the abdomen and pelvis performed 04/17/2014  FINDINGS: Minimal bibasilar atelectasis is noted. Postoperative change is noted about the gastroesophageal junction.  The liver remains diffusely nodular in appearance, with a grossly stable vague area of decreased attenuation at the right hepatic lobe, but no definite dominant mass. This is compatible with hepatic cirrhosis. 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  The portal venous system is diffusely distended, with marked dilatation of the superior mesenteric vein and its branches, and large venous collaterals filling the right side of the abdomen and pelvis, extending along the  right pelvic sidewall. This appearance is grossly stable from the prior study.  As on the prior study, the markedly dilated venous collaterals directly abut the cecum and ascending colon.  There is new soft tissue inflammation about the gallbladder. This could reflect cholecystitis. The spleen remains enlarged, measuring 15.0 cm in size. The pancreas is grossly unremarkable. The adrenal glands are within normal limits.  There is incomplete rotation of the right kidney. The left kidney is more inferiorly positioned than typically seen, likely due to mass effect from the spleen. The kidneys are otherwise unremarkable in appearance. There is no evidence of hydronephrosis. No renal or ureteral stones are seen.  There is outward bulging of the anterior abdominal wall at the level of the umbilicus. This could be considered a very broad-based anterior abdominal wall hernia, and contains loops of small and large bowel.  A slightly heterogeneous 6.0 x 4.9 cm soft tissue density mass is again noted along the anterior abdominal wall, at the right lower quadrant. On correlation with studies dating back to 2013, this is thought most likely to reflect chronic scarring. It is only minimally changed in size in comparison  to studies over the past year.  No free fluid is identified. The small bowel is unremarkable in appearance. The stomach is within normal limits. No acute vascular abnormalities are seen. Scattered calcification is seen along the distal abdominal aorta and its branches.  There appears to be mild diffuse soft tissue inflammation along the course of the ascending colon, which could reflect acute infectious or inflammatory colitis. The remainder of the colon is unremarkable in appearance.  The bladder is mildly distended. A bilobed small cystic focus is noted at the expected location of a urachal remnant, measuring 2.4 cm in size. This is relatively stable in appearance and may reflect a urachal cyst. The prostate is normal in size. No inguinal lymphadenopathy is seen.  No acute osseous abnormalities are identified.  IMPRESSION: 1. Mild diffuse soft tissue inflammation suggested along the course of the ascending colon, which could reflect a mild acute infectious or inflammatory colitis. 2. New soft tissue inflammation noted about the gallbladder. This could reflect cholecystitis, and appears to be distinct from the colonic process. 3. Extensive large venous collaterals noted filling the right side of the abdomen and pelvis, extending along the right pelvic sidewall. The markedly dilated venous collaterals directly abut the cecum and ascending colon, as on the prior study. 4. 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 Hepatic cirrhosis again noted, without a definite dominant mass. 5. Splenomegaly. 6. Outward bulging of the anterior abdominal wall again noted at the level of the umbilicus. This could be considered a very broad-based anterior abdominal wall hernia, containing loops of small and large bowel. No evidence for strangulation or obstruction. 7. Slightly heterogeneous 6.0 x 4.9 cm soft tissue density mass again noted along the anterior abdominal wall, at the right lower quadrant.  This is thought most likely to reflect chronic scarring, on correlation with multiple prior studies, and has changed only minimally in size over the past year. 8. Scattered calcification along the distal abdominal aorta and its branches. 9. Bilobed small cystic focus at the expected location of a urachal remnant, measuring 2.4 cm, appears relatively stable and may reflect a urachal cyst.   Electronically Signed   By: Garald Balding M.D.   On: 07/03/2014 23:05   US Abdomen Limited Ruq  07/04/2014   CLINICAL DATA:  Rule out cholecystitis.  EXAM:  US ABDOMEN LIMITED - RIGHT UPPER QUADRANT  COMPARISON:  CT of the abdomen and pelvis July 03, 2014 at 2226 hr  FINDINGS: Gallbladder:  Gallbladder wall is thickened to 10 mm, no pericholecystic fluid. 1.6 cm stone at the gallbladder neck, which was characterized as multiple tiny calculi on today's CT. No sonographic Murphy's sign was elicited, however patient is reportedly on pain medication.  Common bile duct:  Diameter: 7 mm  Liver:  The liver is nodular in contour without intrahepatic biliary dilatation or pericholecystic fluid. Poor visualization of main portal vein which may reflect chronic thrombosis/cavernous transformation.  IMPRESSION: Cirrhosis. Cholelithiasis with gallbladder wall thickening in a pattern suggesting sequelae of portal hypertension without convincing evidence of acute cholecystitis.  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   Electronically Signed   By: Elon Alas   On: 07/04/2014 00:57     EKG Interpretation None      7:50 PM Discussed case with Dr. Lucia Gaskins who recommends outpatient follow up.   MDM   Final diagnoses:  Abdominal wall hernia  Calculus of gallbladder without cholecystitis without obstruction   Patient presents to ED after his hernia popped out while working on his roof. Initially there was an area of concern in his abdomen which turned out to be likely chronic  scarring. No incarcerated hernia. Patient also found to have cholelithiasis without cholecystitis. Discussed reasons to return to ED immediately including fevers, chills, vomiting, increased pain. Patient understands importance of surgery follow up. Vital signs stable for discharge. Dr. Regenia Skeeter evaluated patient and agrees with plan. Patient / Family / Caregiver informed of clinical course, understand medical decision-making process, and agree with plan.   Elwyn Lade, PA-C 07/04/14 2236

## 2014-07-03 NOTE — ED Notes (Signed)
Per EMS: Pt has left lower hernia, c/o pain in left side.

## 2014-07-04 LAB — LIPASE, BLOOD: Lipase: 31 U/L (ref 11–59)

## 2014-07-04 MED ORDER — OXYCODONE HCL 5 MG PO TABS: 5.0000 mg | ORAL_TABLET | Freq: Four times a day (QID) | ORAL | Status: DC | PRN

## 2014-07-04 MED ORDER — OXYCODONE-ACETAMINOPHEN 5-325 MG PO TABS: 2.0000 | ORAL_TABLET | Freq: Once | ORAL | Status: DC

## 2014-07-04 MED ORDER — OXYCODONE HCL 5 MG PO TABS
10.0000 mg | ORAL_TABLET | Freq: Once | ORAL | Status: AC
  Administered 2014-07-04: 10 mg via ORAL
  Filled 2014-07-04: qty 2

## 2014-07-04 NOTE — Discharge Instructions (Signed)
Please return to ED if you develop fevers, vomiting, or any symptoms concerning to you. It is very important you follow up with general surgery.   Hernia A hernia occurs when an internal organ pushes out through a weak spot in the abdominal wall. Hernias most commonly occur in the groin and around the navel. Hernias often can be pushed back into place (reduced). Most hernias tend to get worse over time. Some abdominal hernias can get stuck in the opening (irreducible or incarcerated hernia) and cannot be reduced. An irreducible abdominal hernia which is tightly squeezed into the opening is at risk for impaired blood supply (strangulated hernia). A strangulated hernia is a medical emergency. Because of the risk for an irreducible or strangulated hernia, surgery may be recommended to repair a hernia. CAUSES   Heavy lifting.  Prolonged coughing.  Straining to have a bowel movement.  A cut (incision) made during an abdominal surgery. HOME CARE INSTRUCTIONS   Bed rest is not required. You may continue your normal activities.  Avoid lifting more than 10 pounds (4.5 kg) or straining.  Cough gently. If you are a smoker it is best to stop. Even the best hernia repair can break down with the continual strain of coughing. Even if you do not have your hernia repaired, a cough will continue to aggravate the problem.  Do not wear anything tight over your hernia. Do not try to keep it in with an outside bandage or truss. These can damage abdominal contents if they are trapped within the hernia sac.  Eat a normal diet.  Avoid constipation. Straining over long periods of time will increase hernia size and encourage breakdown of repairs. If you cannot do this with diet alone, stool softeners may be used. SEEK IMMEDIATE MEDICAL CARE IF:   You have a fever.  You develop increasing abdominal pain.  You feel nauseous or vomit.  Your hernia is stuck outside the abdomen, looks discolored, feels hard, or is  tender.  You have any changes in your bowel habits or in the hernia that are unusual for you.  You have increased pain or swelling around the hernia.  You cannot push the hernia back in place by applying gentle pressure while lying down. MAKE SURE YOU:   Understand these instructions.  Will watch your condition.  Will get help right away if you are not doing well or get worse. Document Released: 11/30/2005 Document Revised: 02/22/2012 Document Reviewed: 07/19/2008 [facility] Patient Information 2015 [place], [place]. This information is not intended to replace advice given to you by your health care provider. Make sure you discuss any questions you have with your health care provider.  Cholelithiasis Cholelithiasis (also called gallstones) is a form of gallbladder disease. The gallbladder is a small organ that helps you digest fats. Symptoms of gallstones are:  Feeling sick to your stomach (nausea).  Throwing up (vomiting).  Belly pain.  Yellowing of the skin (jaundice).  Sudden pain. You may feel the pain for minutes to hours.  Fever.  Pain to the touch. HOME CARE  Only take medicines as told by your doctor.  Eat a low-fat diet until you see your doctor again. Eating fat can result in pain.  Follow up with your doctor as told. Attacks usually happen time after time. Surgery is usually needed for permanent treatment. GET HELP RIGHT AWAY IF:   Your pain gets worse.  Your pain is not helped by medicines.  You have a fever and lasting symptoms for more  than 2-3 days.  You have a fever and your symptoms suddenly get worse.  You keep feeling sick to your stomach and throwing up. MAKE SURE YOU:   Understand these instructions.  Will watch your condition.  Will get help right away if you are not doing well or get worse. Document Released: 05/18/2008 Document Revised: 08/02/2013 Document Reviewed: 05/24/2013 [facility] Patient Information 2015 [place], [place]. This  information is not intended to replace advice given to you by your health care provider. Make sure you discuss any questions you have with your health care provider.

## 2014-07-06 NOTE — ED Provider Notes (Signed)
Medical screening examination/treatment/procedure(s) were conducted as a shared visit with non-physician practitioner(s) and myself.  I personally evaluated the patient during the encounter.   EKG Interpretation None       Patient with firm knot over right lower abdomen near other reducible parts of hernia. Given his pain, will get CT to r/o incarcerated hernia.   Ephraim Hamburger, MD 07/06/14 2209

## 2014-07-13 ENCOUNTER — Emergency Department (HOSPITAL_COMMUNITY)
Admission: EM | Admit: 2014-07-13 | Discharge: 2014-07-13 | Disposition: A | Discharge status: Home / Self Care | Payer: Medicaid Other | Attending: Emergency Medicine | Admitting: Emergency Medicine

## 2014-07-13 ENCOUNTER — Encounter (HOSPITAL_COMMUNITY): Payer: Self-pay | Admitting: Emergency Medicine

## 2014-07-13 DIAGNOSIS — R1032 Left lower quadrant pain: Secondary | ICD-10-CM | POA: Diagnosis not present

## 2014-07-13 DIAGNOSIS — F17200 Nicotine dependence, unspecified, uncomplicated: Secondary | ICD-10-CM | POA: Insufficient documentation

## 2014-07-13 DIAGNOSIS — Z8619 Personal history of other infectious and parasitic diseases: Secondary | ICD-10-CM | POA: Insufficient documentation

## 2014-07-13 DIAGNOSIS — Z791 Long term (current) use of non-steroidal anti-inflammatories (NSAID): Secondary | ICD-10-CM | POA: Diagnosis not present

## 2014-07-13 DIAGNOSIS — K439 Ventral hernia without obstruction or gangrene: Secondary | ICD-10-CM | POA: Diagnosis not present

## 2014-07-13 DIAGNOSIS — Z8719 Personal history of other diseases of the digestive system: Secondary | ICD-10-CM | POA: Insufficient documentation

## 2014-07-13 DIAGNOSIS — F1010 Alcohol abuse, uncomplicated: Secondary | ICD-10-CM | POA: Diagnosis not present

## 2014-07-13 DIAGNOSIS — R109 Unspecified abdominal pain: Secondary | ICD-10-CM | POA: Diagnosis present

## 2014-07-13 MED ORDER — KETOROLAC TROMETHAMINE 60 MG/2ML IM SOLN
60.0000 mg | Freq: Once | INTRAMUSCULAR | Status: AC
  Administered 2014-07-13: 60 mg via INTRAMUSCULAR
  Filled 2014-07-13: qty 2

## 2014-07-13 NOTE — ED Provider Notes (Signed)
Medical screening examination/treatment/procedure(s) were conducted as a shared visit with non-physician practitioner(s) and myself.  I personally evaluated the patient during the encounter.   EKG Interpretation None       Patient presented to the ER with abdominal pain  Face to face Exam: HEENT - PERRLA Lungs - CTAB Heart - RRR, no M/R/G Abd - soft, reducible midline hernia in the area of umbilicus, firm tenderness to the right side of the scar Neuro - alert, oriented x3  Plan: Area of tenderness to the right side of the scar is consistent with scar tissue seen on CAT scan last week. No evidence of incarcerated hernia. Symptomatic treatment necessary. Followup with surgery.   Orpah Greek, MD 07/13/14 [phone]

## 2014-07-13 NOTE — ED Provider Notes (Signed)
CSN: [id number]     Arrival date & time 07/13/14  1747 History   First MD Initiated Contact with Patient 07/13/14 1754     Chief Complaint  Patient presents with  . Hernia     (Consider location/radiation/quality/duration/timing/severity/associated sxs/prior Treatment) HPI  Patient to the ED by EMS with complaints of abdominal pains. He is clearly intoxicated and has urinated on himself. He reports walking down the street and feeling a popping sensation. He was seen in the ED on 7/22 for the same, had a  CT abd/pelv done which showed no incarcerated hernia, or new herniation. It showed chronic scarring changes from previous surgeries. He has a PMH of cirrhosis, hepatitis C, GERD, and hernia repairs. Surgeon is Dr. Ninfa Linden. He has not tried to reduce the hernia. Pt here today for help with abdominal pain.  Past Medical History  Diagnosis Date  . Cirrhosis of liver   . Hepatitis C 03-28-12    ? 80's-prev. IV drug abuse  . 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 Blood transfusion 03-28-12    '81-hx. of bleeding ulcer  . History of bleeding ulcers 03-28-12    Hx. '81  . GERD (gastroesophageal reflux disease) 03-26-12    Hx. reflux. ulcers in past, occ. OTC med  . Incisional hernia     has drainage system from abdomin  . Subdural hematoma 12/13/12   Past Surgical History  Procedure Laterality Date  . Umbilical hernia repair    . Hernia repair  08/24/11    ventral hernia with mesh  . Hernia repair  10'25    Umbilical  . Peptic ulcer  03-26-12    open peptic ulcer surgery repair- '81  . Incisional hernia repair  03/26/2012  . Incisional hernia repair  03/30/2012    Procedure: LAPAROSCOPIC INCISIONAL HERNIA;  Surgeon: Harl Bowie, MD;  Location: [facility];  Service: General;  Laterality: N/A;  Laparoscopic Incisional/ventral Hernia Repair with Mesh  . Tonsillectomy    . Appendectomy    . Incisional hernia repair  07/20/2012    mesh  . Incisional hernia  repair  07/20/2012    Procedure: HERNIA REPAIR INCISIONAL;  Surgeon: Harl Bowie, MD;  Location: [place];  Service: General;  Laterality: N/A;  repair of incisional hernia with Biologic mesh   Family History  Problem Relation Age of Onset  . Cancer Mother     breast  . Cancer Father     brain  . Heart disease Brother   . Cancer Sister     breast  . Cancer Sister     breast   History  Substance Use Topics  . Smoking status: Current Every Day Smoker -- 0.25 packs/day for 38 years    Types: Cigarettes  . Smokeless tobacco: Never Used  . Alcohol Use: 21.0 oz/week    35 Cans of beer per week     Comment: 6 beers a week per pt    Review of Systems   Review of Systems  Gen: no weight loss, fevers, chills, night sweats + alcohol intoxication Eyes: no discharge or drainage, no occular pain or visual changes  Nose: no epistaxis or rhinorrhea  Mouth: no dental pain, no sore throat  Neck: no neck pain  Lungs:No wheezing or hemoptysis No coughing CV:  No palpitations, dependent edema or orthopnea. No chest pain Abd: no diarrhea. No nausea or vomiting, + abdominal pain  GU: no dysuria or gross hematuria  MSK:  No muscle  weakness, No  pain Neuro: no headache, no focal neurologic deficits  Skin: no rash , no wounds Psyche: no complaints    Allergies  Review of patient's allergies indicates no known allergies.  Home Medications   Prior to Admission medications   Medication Sig Start Date End Date Taking? Authorizing Provider  ibuprofen (ADVIL,MOTRIN) 200 MG tablet Take 400 mg by mouth every 6 (six) hours as needed for headache or moderate pain.    Yes Historical Provider, MD  Multiple Vitamin (MULITIVITAMIN WITH MINERALS) TABS Take 1 tablet by mouth daily.   Yes Historical Provider, MD  naproxen sodium (ANAPROX) 220 MG tablet Take 440 mg by mouth 2 (two) times daily with a meal.   Yes Historical Provider, MD  oxyCODONE (ROXICODONE) 5 MG immediate release tablet Take 1 tablet  (5 mg total) by mouth every 6 (six) hours as needed for severe pain. 07/04/14  Yes Hannah S Merrell, PA-C   BP 110/52  Pulse 82  Temp(Src) 98.2 F (36.8 C) (Oral)  Resp 16  SpO2 95% Physical Exam  Nursing note and vitals reviewed. Constitutional: He appears well-developed and well-nourished. No distress.  HENT:  Head: Normocephalic and atraumatic.  Eyes: Pupils are equal, round, and reactive to light.  Neck: Normal range of motion. Neck supple.  Cardiovascular: Normal rate and regular rhythm.   Pulmonary/Chest: Effort normal.  Abdominal: Soft. Bowel sounds are normal. There is tenderness in the left lower quadrant. There is guarding (voluntary and not consistently). There is no rebound and no CVA tenderness.  Old surgical scars present. Reducible and soft chroric hernia (by CT scan 7/22) visible. No hernia felt underlying area of patients pain.  Neurological: He is alert.  Skin: Skin is warm and dry.  Psychiatric:  Pt is intoxicated, smells of alcohol    ED Course  Procedures (including critical care time) Labs Review Labs Reviewed - No data to display  Imaging Review No results found.   EKG Interpretation None      MDM   Final diagnoses:  Abdominal pain, unspecified abdominal location    Patient had US abdomen of tender area done by Dr. Darl Householder, only scare tissue present. No hernia. Patient concerned about pain. Will give IM  Toradol and have him follow-up with on- call surgeon Dr. Johney [place]. Dr Betsey Holiday has seen patient as well, no further work-up of evaluation needed at this time. Vital signs are stable.  58 y.o.Gracyn Santillanes Kaatz's evaluation in the Emergency Department is complete. It has been determined that no acute conditions requiring further emergency intervention are present at this time. The patient/guardian have been advised of the diagnosis and plan. We have discussed signs and symptoms that warrant return to the ED, such as changes or worsening in symptoms.  Vital  signs are stable at discharge. Filed Vitals:   07/13/14 1751  BP: 110/52  Pulse: 82  Temp: 98.2 F (36.8 C)  Resp: 16    Patient/guardian has voiced understanding and agreed to follow-up with the PCP or specialist.     Linus Mako, PA-C 07/13/14 1846

## 2014-07-13 NOTE — ED Notes (Signed)
Per EMS, pt c/o abdominal pain and an umbilical hernia. Pt has had surgeries in the past to fix it.  Pt sts he was just walking along and felt "it pop." Pt smells like alcohol and admits to drinking. A&Ox4.

## 2014-07-13 NOTE — Discharge Instructions (Signed)

## 2014-07-13 NOTE — ED Notes (Signed)
Bed: WA06 Expected date:  Expected time:  Means of arrival:  Comments: EMS hernia

## 2014-07-13 NOTE — ED Notes (Signed)
Pt has obvious bulge just above the umbilicus. Pt has had surgery in the past to repair. Pt states that he "was just walking and felt a pop." PT smells of ETOH and states that he drank 6 beers. Pt is A&O and in NAD

## 2014-09-15 ENCOUNTER — Encounter (HOSPITAL_COMMUNITY): Payer: Self-pay | Admitting: Emergency Medicine

## 2014-09-15 DIAGNOSIS — Z8619 Personal history of other infectious and parasitic diseases: Secondary | ICD-10-CM | POA: Insufficient documentation

## 2014-09-15 DIAGNOSIS — Z79899 Other long term (current) drug therapy: Secondary | ICD-10-CM | POA: Diagnosis not present

## 2014-09-15 DIAGNOSIS — Z720 Tobacco use: Secondary | ICD-10-CM | POA: Diagnosis not present

## 2014-09-15 DIAGNOSIS — F1010 Alcohol abuse, uncomplicated: Secondary | ICD-10-CM | POA: Insufficient documentation

## 2014-09-15 DIAGNOSIS — F329 Major depressive disorder, single episode, unspecified: Secondary | ICD-10-CM | POA: Diagnosis not present

## 2014-09-15 DIAGNOSIS — R45850 Homicidal ideations: Secondary | ICD-10-CM | POA: Diagnosis present

## 2014-09-15 DIAGNOSIS — K219 Gastro-esophageal reflux disease without esophagitis: Secondary | ICD-10-CM | POA: Diagnosis not present

## 2014-09-15 DIAGNOSIS — F1410 Cocaine abuse, uncomplicated: Secondary | ICD-10-CM | POA: Insufficient documentation

## 2014-09-15 NOTE — ED Notes (Signed)
Pt presents with c/o homicidal tendencies towards an individual that owes him some money and alcohol intoxication. Pt says that he was afraid he was going to hurt this person so he called the police. Pt also reports he has been drinking alcohol today, approx 3 40's and used cocaine today as well.

## 2014-09-16 ENCOUNTER — Encounter (HOSPITAL_COMMUNITY): Payer: Self-pay | Admitting: Registered Nurse

## 2014-09-16 ENCOUNTER — Emergency Department (HOSPITAL_COMMUNITY)
Admission: EM | Admit: 2014-09-16 | Discharge: 2014-09-16 | Disposition: A | Discharge status: Home / Self Care | Payer: Medicaid Other | Attending: Emergency Medicine | Admitting: Emergency Medicine

## 2014-09-16 DIAGNOSIS — F10929 Alcohol use, unspecified with intoxication, unspecified: Secondary | ICD-10-CM | POA: Diagnosis present

## 2014-09-16 DIAGNOSIS — F1019 Alcohol abuse with unspecified alcohol-induced disorder: Secondary | ICD-10-CM

## 2014-09-16 DIAGNOSIS — E871 Hypo-osmolality and hyponatremia: Secondary | ICD-10-CM

## 2014-09-16 DIAGNOSIS — F1994 Other psychoactive substance use, unspecified with psychoactive substance-induced mood disorder: Secondary | ICD-10-CM

## 2014-09-16 DIAGNOSIS — F1094 Alcohol use, unspecified with alcohol-induced mood disorder: Secondary | ICD-10-CM

## 2014-09-16 DIAGNOSIS — R45850 Homicidal ideations: Secondary | ICD-10-CM

## 2014-09-16 DIAGNOSIS — F10920 Alcohol use, unspecified with intoxication, uncomplicated: Secondary | ICD-10-CM

## 2014-09-16 DIAGNOSIS — F32A Depression, unspecified: Secondary | ICD-10-CM

## 2014-09-16 DIAGNOSIS — F1010 Alcohol abuse, uncomplicated: Secondary | ICD-10-CM

## 2014-09-16 DIAGNOSIS — F329 Major depressive disorder, single episode, unspecified: Secondary | ICD-10-CM

## 2014-09-16 LAB — COMPREHENSIVE METABOLIC PANEL
ALT: 45 U/L (ref 0–53)
AST: 97 U/L — AB (ref 0–37)
Albumin: 3.1 g/dL — ABNORMAL LOW (ref 3.5–5.2)
Alkaline Phosphatase: 91 U/L (ref 39–117)
Anion gap: 12 (ref 5–15)
BUN: 9 mg/dL (ref 6–23)
CALCIUM: 8.7 mg/dL (ref 8.4–10.5)
CO2: 24 meq/L (ref 19–32)
CREATININE: 1.03 mg/dL (ref 0.50–1.35)
Chloride: 104 mEq/L (ref 96–112)
GFR calc Af Amer: 90 mL/min — ABNORMAL LOW (ref >90)
GFR, EST NON AFRICAN AMERICAN: 78 mL/min — AB (ref >90)
Glucose, Bld: 131 mg/dL — ABNORMAL HIGH (ref 70–99)
Potassium: 3.6 mEq/L — ABNORMAL LOW (ref 3.7–5.3)
Sodium: 140 mEq/L (ref 137–147)
TOTAL PROTEIN: 7.2 g/dL (ref 6.0–8.3)
Total Bilirubin: 1.9 mg/dL — ABNORMAL HIGH (ref 0.3–1.2)

## 2014-09-16 LAB — CBC
HCT: 34.2 % — ABNORMAL LOW (ref 39.0–52.0)
Hemoglobin: 12.4 g/dL — ABNORMAL LOW (ref 13.0–17.0)
MCH: 34.6 pg — AB (ref 26.0–34.0)
MCHC: 36.3 g/dL — AB (ref 30.0–36.0)
MCV: 95.5 fL (ref 78.0–100.0)
PLATELETS: 53 10*3/uL — AB (ref 150–400)
RBC: 3.58 MIL/uL — AB (ref 4.22–5.81)
RDW: 15.3 % (ref 11.5–15.5)
WBC: 4.3 10*3/uL (ref 4.0–10.5)

## 2014-09-16 LAB — RAPID URINE DRUG SCREEN, HOSP PERFORMED
Amphetamines: NOT DETECTED
BENZODIAZEPINES: NOT DETECTED
Barbiturates: NOT DETECTED
Cocaine: POSITIVE — AB
OPIATES: NOT DETECTED
Tetrahydrocannabinol: NOT DETECTED

## 2014-09-16 LAB — ACETAMINOPHEN LEVEL: Acetaminophen (Tylenol), Serum: <15 ug/mL (ref 10–30)

## 2014-09-16 LAB — SALICYLATE LEVEL

## 2014-09-16 LAB — ETHANOL: ALCOHOL ETHYL (B): 202 mg/dL — AB (ref 0–11)

## 2014-09-16 MED ORDER — ONDANSETRON HCL 4 MG PO TABS: 4.0000 mg | ORAL_TABLET | Freq: Three times a day (TID) | ORAL | Status: DC | PRN

## 2014-09-16 MED ORDER — NICOTINE 21 MG/24HR TD PT24: 21.0000 mg | MEDICATED_PATCH | Freq: Every day | TRANSDERMAL | Status: DC

## 2014-09-16 MED ORDER — IBUPROFEN 200 MG PO TABS
600.0000 mg | ORAL_TABLET | Freq: Three times a day (TID) | ORAL | Status: DC | PRN
  Administered 2014-09-16: 600 mg via ORAL
  Filled 2014-09-16: qty 3

## 2014-09-16 MED ORDER — ZOLPIDEM TARTRATE 5 MG PO TABS: 5.0000 mg | ORAL_TABLET | Freq: Every evening | ORAL | Status: DC | PRN

## 2014-09-16 NOTE — ED Notes (Signed)
Belongings locked up in locker 36

## 2014-09-16 NOTE — ED Notes (Signed)
Dr Bennie Pierini into see

## 2014-09-16 NOTE — ED Notes (Signed)
OK to dc w/o EDP seeing patient per S. Rankin NP--instruct patient to keep feel clean and dry, neosporin daily,and watch for signs of infection.

## 2014-09-16 NOTE — Progress Notes (Signed)
11:37am. CSW spoke with Domenica Fail at [facility] homeless shelter. States that they have space today for pt, and will save bed. CSW informed pt and psychiatrist of this disposition option. Pt states he will go to [facility] today.   Rochele Pages,     ED CSW  phone: [phone]

## 2014-09-16 NOTE — ED Notes (Signed)
Dr Ashok Cordia to eval feet prior to dc

## 2014-09-16 NOTE — ED Provider Notes (Signed)
Medical screening examination/treatment/procedure(s) were performed by non-physician practitioner and as supervising physician I was immediately available for consultation/collaboration.   EKG Interpretation None       Richrd Kuzniar K Dynver Clemson-Rasch, MD 09/16/14 8032

## 2014-09-16 NOTE — ED Notes (Signed)
Bed: [facility] Expected date:  Expected time:  Means of arrival:  Comments: Med clearance with sheriff

## 2014-09-16 NOTE — ED Provider Notes (Signed)
CSN: [id number]     Arrival date & time 09/15/14  2356 History   First MD Initiated Contact with Patient 09/16/14 0129     Chief Complaint  Patient presents with  . Homicidal  . Alcohol Intoxication     (Consider location/radiation/quality/duration/timing/severity/associated sxs/prior Treatment) HPI Comments: Patient presents tonight, homicidal against an individual is, afraid of what he might do.  He knows his capabilities.  Has been in the [employer] and has been in jail for murder. Denies depression, a known psychiatric diagnosis, states he is currently homeless  Patient is a 58 y.o. male presenting with intoxication. The history is provided by the patient.  Alcohol Intoxication This is a recurrent problem. The current episode started today. The problem occurs intermittently. The problem has been unchanged. Pertinent negatives include no abdominal pain, chills or fever. Nothing aggravates the symptoms. He has tried nothing for the symptoms. The treatment provided no relief.    Past Medical History  Diagnosis Date  . Cirrhosis of liver   . Hepatitis C 03-28-12    ? 80's-prev. IV drug abuse  . 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 Blood transfusion 03-28-12    '81-hx. of bleeding ulcer  . History of bleeding ulcers 03-28-12    Hx. '81  . GERD (gastroesophageal reflux disease) 03-26-12    Hx. reflux. ulcers in past, occ. OTC med  . Incisional hernia     has drainage system from abdomin  . Subdural hematoma 12/13/12   Past Surgical History  Procedure Laterality Date  . Umbilical hernia repair    . Hernia repair  08/24/11    ventral hernia with mesh  . Hernia repair  01'02    Umbilical  . Peptic ulcer  03-26-12    open peptic ulcer surgery repair- '81  . Incisional hernia repair  03/26/2012  . Incisional hernia repair  03/30/2012    Procedure: LAPAROSCOPIC INCISIONAL HERNIA;  Surgeon: Harl Bowie, MD;  Location[facility];  Service: General;   Laterality: N/A;  Laparoscopic Incisional/ventral Hernia Repair with Mesh  . Tonsillectomy    . Appendectomy    . Incisional hernia repair  07/20/2012    mesh  . Incisional hernia repair  07/20/2012    Procedure: HERNIA REPAIR INCISIONAL;  Surgeon: Harl Bowie, MD;  Location: [facility];  Service: General;  Laterality: N/A;  repair of incisional hernia with Biologic mesh   Family History  Problem Relation Age of Onset  . Cancer Mother     breast  . Cancer Father     brain  . Heart disease Brother   . Cancer Sister     breast  . Cancer Sister     breast   History  Substance Use Topics  . Smoking status: Current Every Day Smoker -- 38 years    Types: Cigarettes  . Smokeless tobacco: Never Used  . Alcohol Use: 21.0 oz/week    35 Cans of beer per week     Comment: on weekends     Review of Systems  Constitutional: Negative for fever and chills.  Respiratory: Negative for shortness of breath.   Gastrointestinal: Negative for abdominal pain.  Neurological: Negative for dizziness.  Psychiatric/Behavioral: Negative for suicidal ideas, hallucinations, behavioral problems, self-injury and agitation. The patient is not nervous/anxious and is not hyperactive.   All other systems reviewed and are negative.     Allergies  Review of patient's allergies indicates no known allergies.  Home Medications   Prior  to Admission medications   Medication Sig Start Date End Date Taking? Authorizing Provider  Multiple Vitamin (MULITIVITAMIN WITH MINERALS) TABS Take 1 tablet by mouth daily.   Yes Historical Provider, MD  omeprazole (PRILOSEC OTC) 20 MG tablet Take 40 mg by mouth daily.   Yes Historical Provider, MD   BP 140/65  Pulse 81  Temp(Src) 97.7 F (36.5 C) (Oral)  Resp 18  SpO2 98% Physical Exam  Nursing note and vitals reviewed. Constitutional: He appears well-developed and well-nourished.  HENT:  Head: Normocephalic.  Eyes: Pupils are equal, round, and reactive to light.   Neck: Normal range of motion.  Cardiovascular: Normal rate.   Pulmonary/Chest: Effort normal.  Musculoskeletal: Normal range of motion.  Neurological: He is alert.  Skin: Skin is warm.  Psychiatric: He has a normal mood and affect. His speech is normal and behavior is normal. Cognition and memory are normal. He expresses inappropriate judgment. He expresses homicidal ideation. He expresses homicidal plans.    ED Course  Procedures (including critical care time) Labs Review Labs Reviewed  CBC - Abnormal; Notable for the following:    RBC 3.58 (*)    Hemoglobin 12.4 (*)    HCT 34.2 (*)    MCH 34.6 (*)    MCHC 36.3 (*)    Platelets 53 (*)    All other components within normal limits  COMPREHENSIVE METABOLIC PANEL - Abnormal; Notable for the following:    Potassium 3.6 (*)    Glucose, Bld 131 (*)    Albumin 3.1 (*)    AST 97 (*)    Total Bilirubin 1.9 (*)    GFR calc non Af Amer 78 (*)    GFR calc Af Amer 90 (*)    All other components within normal limits  ETHANOL - Abnormal; Notable for the following:    Alcohol, Ethyl (B) 202 (*)    All other components within normal limits  SALICYLATE LEVEL - Abnormal; Notable for the following:    Salicylate Lvl <1.2 (*)    All other components within normal limits  URINE RAPID DRUG SCREEN (HOSP PERFORMED) - Abnormal; Notable for the following:    Cocaine POSITIVE (*)    All other components within normal limits  ACETAMINOPHEN LEVEL    Imaging Review No results found.   EKG Interpretation None      MDM   Final diagnoses:  None         Garald Balding, NP 09/16/14 0136

## 2014-09-16 NOTE — ED Notes (Signed)
Written dc instructions, s/s of infection and daily care reviewed w/ patient. Pt verbalized under standing. CSW to provide cab voucher for patient to [facility].

## 2014-09-16 NOTE — [facility] Suicide Risk Assessment (Cosign Needed)
Suicide Risk Assessment  Discharge Assessment     Demographic Factors:  Male and Caucasian  Total Time spent with patient: 30 minutes  Psychiatric Specialty Exam:     Blood pressure 132/58, pulse 87, temperature 99.6 F (37.6 C), temperature source Oral, resp. rate 18, SpO2 99.00%.There is no weight on file to calculate BMI.   General Appearance: Disheveled   Eye Contact:: Good   Speech: Clear and Coherent and Normal Rate   Volume: Normal   Mood: Appropriate   Affect: Appropriate and Congruent   Thought Process: Circumstantial   Orientation: Full (Time, Place, and Person)   Thought Content: Rumination   Suicidal Thoughts: No   Homicidal Thoughts: No   Memory: Immediate; Good  Recent; Good  Remote; Good   Judgement: Fair   Insight: Fair   Psychomotor Activity: Tremor   Concentration: Fair   Recall: Good   Fund of Knowledge:Good   Language: Good   Akathisia: No   Handed: Right   AIMS (if indicated):   Assets: Communication Skills  Desire for Improvement   Sleep:   Musculoskeletal:  Strength & Muscle Tone: within normal limits  Gait & Station: Patient sitting on bed. Did not see patient ambulate. Complaints of foot pain related to blisters on bottom of feet  Patient leans: N/A  Consulted with TTS/SW and patient has bed at [facility] (shelter)  Dr. Adele Schilder agrees with treatment    Mental Status Per Nursing Assessment::   On Admission:     Current Mental Status by Physician: Patient denies suicidal/homicidal ideation, psychosis, and paranoia  Loss Factors: NA  Historical Factors: NA  Risk Reduction Factors:   Positive social support  Continued Clinical Symptoms:  Alcohol/Substance Abuse/Dependencies  Cognitive Features That Contribute To Risk:  None noted    Suicide Risk:  Minimal: No identifiable suicidal ideation.  Patients presenting with no risk factors but with morbid ruminations; may be classified as minimal risk based on the severity of the  depressive symptoms  Discharge Diagnoses:  AXIS I: Alcohol Abuse and Substance Induced Mood Disorder  AXIS II: Deferred  AXIS III:  Past Medical History   Diagnosis  Date   .  Cirrhosis of liver    .  Hepatitis C  03-28-12     ? 80's-prev. IV drug abuse   .  Occasional tremors  03-28-12     tremors"essential"- heriditary   .  Blood transfusion  03-28-12     '81-hx. of bleeding ulcer   .  History of bleeding ulcers  03-28-12     Hx. '81   .  GERD (gastroesophageal reflux disease)  03-26-12     Hx. reflux. ulcers in past, occ. OTC med   .  Incisional hernia      has drainage system from abdomin   .  Subdural hematoma  12/13/12    AXIS IV: economic problems, housing problems, other psychosocial or environmental problems and problems related to social environment  AXIS V: 61-70 mild symptoms  Plan Of Care/Follow-up recommendations:  Activity:  as tolerated Diet:  as tolerated  Is patient on multiple antipsychotic therapies at discharge:  No   Has Patient had three or more failed trials of antipsychotic monotherapy by history:  No  Recommended Plan for Multiple Antipsychotic Therapies: NA    Rankin, Shuvon, FNP-BC 09/16/2014, 1:11 PM

## 2014-09-16 NOTE — Consult Note (Signed)
[facility] Consult   Reason for Consult:  Homicidal ideation  Referring Physician:  EDP  Noah Medina is an 58 y.o. male. Total Time spent with patient: 30 minutes  Assessment: AXIS I:  Alcohol Abuse and Substance Induced Mood Disorder AXIS II:  Deferred AXIS III:   Past Medical History  Diagnosis Date  . Cirrhosis of liver   . Hepatitis C 03-28-12    ? 80's-prev. IV drug abuse  . 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 Blood transfusion 03-28-12    '81-hx. of bleeding ulcer  . History of bleeding ulcers 03-28-12    Hx. '81  . GERD (gastroesophageal reflux disease) 03-26-12    Hx. reflux. ulcers in past, occ. OTC med  . Incisional hernia     has drainage system from abdomin  . Subdural hematoma 12/13/12   AXIS IV:  economic problems, housing problems, other psychosocial or environmental problems and problems related to social environment AXIS V:  61-70 mild symptoms  Plan:  No evidence of imminent risk to self or others at present.   Patient does not meet criteria for psychiatric inpatient admission. Supportive therapy provided about ongoing stressors. Discussed crisis plan, support from social network, calling 911, coming to the Emergency Department, and calling Suicide Hotline.  Subjective:   Noah Medina is a 58 y.o. male patient who presents to [facility] with intoxication and complaints of homicidal ideation.  During interview today patient states that he was "I was drunk; I told the police I was going to kill somebody."  Patient states that he is homeless and is living in a tent out in the woods right now.  "Yesterday I has a little young girl to go buy of some cocaine and we drank."  Patient states that he drinks daily.  Patient also has the tremor "I been had this since the day I was born." but then states that it only happens when he has not had anything to drink (alcohol). Patient denies homicidal ideation.  Patient also denies  suicidal ideation, psychosis, and paranoia.   Patient has complaints of blisters on his feet that are painful.  States that the blisters are from walking.  EDP consulted to look at blisters and give medication recommendation prior to patient discharge.    Patient Labs discussed with patient and patient encouraged to stop drinking alcohol and cocaine   HPI Elements:   Location:  alcohol intoxication. Quality:  substance induced mood disorder. Severity:  substance induced mood disorder. Timing:  1 day. Review of Systems  Gastrointestinal: Negative for nausea, vomiting, abdominal pain, diarrhea and constipation.  Skin:       Complaints of blisters and pain on bottom of feet that are a result of waking.   Neurological: Positive for tremors.  Psychiatric/Behavioral: Depression: Denies. Suicidal ideas: Denies. Hallucinations: Denies. Memory loss: Denies. Substance abuse: Alcohol. Nervous/anxious: Denies. Insomnia: Denies.   All other systems reviewed and are negative. Patient instructed to keep feet washed/clean, dry, and may apply Neosporin/triple antibiotic to open area.   Family History  Problem Relation Age of Onset  . Cancer Mother     breast  . Cancer Father     brain  . Heart disease Brother   . Cancer Sister     breast  . Cancer Sister     breast    Past Psychiatric History: Past Medical History  Diagnosis Date  . Cirrhosis of liver   . Hepatitis C 03-28-12    ?  80's-prev. IV drug abuse  . 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 Blood transfusion 03-28-12    '81-hx. of bleeding ulcer  . History of bleeding ulcers 03-28-12    Hx. '81  . GERD (gastroesophageal reflux disease) 03-26-12    Hx. reflux. ulcers in past, occ. OTC med  . Incisional hernia     has drainage system from abdomin  . Subdural hematoma 12/13/12    reports that he has been smoking Cigarettes.  He has been smoking about 0.00 packs per day for the past 38 years. He has never used  smokeless tobacco. He reports that he drinks about 21 ounces of alcohol per week. He reports that he uses illicit drugs (Cocaine). Family History  Problem Relation Age of Onset  . Cancer Mother     breast  . Cancer Father     brain  . Heart disease Brother   . Cancer Sister     breast  . Cancer Sister     breast   Family History Substance Abuse: No Family Supports: Yes, List: (Brother and sister) Living Arrangements: Other (Comment) (Homeless) Can pt return to current living arrangement?: Yes Abuse/Neglect [facility]) Physical Abuse: Denies Verbal Abuse: Yes, past (Comment) (Childhood ) Sexual Abuse: Denies Allergies:  No Known Allergies  ACT Assessment Complete:  Yes:    Educational Status    Risk to Self: Risk to self with the past 6 months Suicidal Ideation: No Suicidal Intent: No Is patient at risk for suicide?: No Suicidal Plan?: No Access to Means: No What has been your use of drugs/alcohol within the last 12 months?: Pt reported that he drinks alcohol every other day. Previous Attempts/Gestures: No How many times?: 0 Other Self Harm Risks: No other self harm risk identified at this time. Triggers for Past Attempts: Unpredictable Intentional Self Injurious Behavior: None Family Suicide History: No Recent stressful life event(s): Financial Problems;Other (Comment) (Homeless) Persecutory voices/beliefs?: No Depression: Yes Depression Symptoms: Insomnia;Isolating;Fatigue;Guilt;Feeling angry/irritable Substance abuse history and/or treatment for substance abuse?: Yes Suicide prevention information given to non-admitted patients: Not applicable  Risk to Others: Risk to Others within the past 6 months Homicidal Ideation: Yes-Currently Present Thoughts of Harm to Others: Yes-Currently Present Comment - Thoughts of Harm to Others: "I called the police and told them I was going to kill him". Current Homicidal Intent: Yes-Currently Present Current Homicidal Plan: Yes-Currently  Present Describe Current Homicidal Plan: "I was at his house with a knife".  Access to Homicidal Means: Yes Describe Access to Homicidal Means: PT reported that he had a knife.  Identified Victim: [place] History of harm to others?: Yes Assessment of Violence: In distant past Violent Behavior Description: Pt reported that he was incarcerated for 10 1/2 years for murder. Does patient have access to weapons?: Yes (Comment) (Knife) Criminal Charges Pending?: No Does patient have a court date: No  Abuse: Abuse/Neglect Assessment (Assessment to be complete while patient is alone) Physical Abuse: Denies Verbal Abuse: Yes, past (Comment) (Childhood ) Sexual Abuse: Denies Exploitation of patient/patient's resources: Denies Self-Neglect: Denies  Prior Inpatient Therapy: Prior Inpatient Therapy Prior Inpatient Therapy: No  Prior Outpatient Therapy: Prior Outpatient Therapy Prior Outpatient Therapy: No  Additional Information: Additional Information 1:1 In Past 12 Months?: No CIRT Risk: No Elopement Risk: No Does patient have medical clearance?: Yes     Objective: Blood pressure 132/58, pulse 87, temperature 99.6 F (37.6 C), temperature source Oral, resp. rate 18, SpO2 99.00%.There is no weight on file to  calculate BMI. Results for orders placed during the hospital encounter of 09/16/14 (from the past 72 hour(s))  URINE RAPID DRUG SCREEN (HOSP PERFORMED)     Status: Abnormal   Collection Time    09/16/14 12:07 AM      Result Value Ref Range   Opiates NONE DETECTED  NONE DETECTED   Cocaine POSITIVE (*) NONE DETECTED   Benzodiazepines NONE DETECTED  NONE DETECTED   Amphetamines NONE DETECTED  NONE DETECTED   Tetrahydrocannabinol NONE DETECTED  NONE DETECTED   Barbiturates NONE DETECTED  NONE DETECTED   Comment:            DRUG SCREEN FOR MEDICAL PURPOSES     ONLY.  IF CONFIRMATION IS NEEDED     FOR ANY PURPOSE, NOTIFY LAB     WITHIN 5 DAYS.                LOWEST DETECTABLE  LIMITS     FOR URINE DRUG SCREEN     Drug Class       Cutoff (ng/mL)     Amphetamine      1000     Barbiturate      200     Benzodiazepine   219     Tricyclics       758     Opiates          300     Cocaine          300     THC              50  ACETAMINOPHEN LEVEL     Status: None   Collection Time    09/16/14 12:37 AM      Result Value Ref Range   Acetaminophen (Tylenol), Serum <15.0  10 - 30 ug/mL   Comment:            THERAPEUTIC CONCENTRATIONS VARY     SIGNIFICANTLY. A RANGE OF 10-30     ug/mL MAY BE AN EFFECTIVE     CONCENTRATION FOR MANY PATIENTS.     HOWEVER, SOME ARE BEST TREATED     AT CONCENTRATIONS OUTSIDE THIS     RANGE.     ACETAMINOPHEN CONCENTRATIONS     >150 ug/mL AT 4 HOURS AFTER     INGESTION AND >50 ug/mL AT 12     HOURS AFTER INGESTION ARE     OFTEN ASSOCIATED WITH TOXIC     REACTIONS.  CBC     Status: Abnormal   Collection Time    09/16/14 12:37 AM      Result Value Ref Range   WBC 4.3  4.0 - 10.5 K/uL   RBC 3.58 (*) 4.22 - 5.81 MIL/uL   Hemoglobin 12.4 (*) 13.0 - 17.0 g/dL   HCT 34.2 (*) 39.0 - 52.0 %   MCV 95.5  78.0 - 100.0 fL   MCH 34.6 (*) 26.0 - 34.0 pg   MCHC 36.3 (*) 30.0 - 36.0 g/dL   RDW 15.3  11.5 - 15.5 %   Platelets 53 (*) 150 - 400 K/uL   Comment: SPECIMEN CHECKED FOR CLOTS     REPEATED TO VERIFY     PLATELET COUNT CONFIRMED BY SMEAR  COMPREHENSIVE METABOLIC PANEL     Status: Abnormal   Collection Time    09/16/14 12:37 AM      Result Value Ref Range   Sodium 140  137 - 147 mEq/L   Potassium 3.6 (*) 3.7 -  5.3 mEq/L   Chloride 104  96 - 112 mEq/L   CO2 24  19 - 32 mEq/L   Glucose, Bld 131 (*) 70 - 99 mg/dL   BUN 9  6 - 23 mg/dL   Creatinine, Ser 1.03  0.50 - 1.35 mg/dL   Calcium 8.7  8.4 - 10.5 mg/dL   Total Protein 7.2  6.0 - 8.3 g/dL   Albumin 3.1 (*) 3.5 - 5.2 g/dL   AST 97 (*) 0 - 37 U/L   ALT 45  0 - 53 U/L   Alkaline Phosphatase 91  39 - 117 U/L   Total Bilirubin 1.9 (*) 0.3 - 1.2 mg/dL   GFR calc non Af Amer 78  (*) >90 mL/min   GFR calc Af Amer 90 (*) >90 mL/min   Comment: (NOTE)     The eGFR has been calculated using the CKD EPI equation.     This calculation has not been validated in all clinical situations.     eGFR's persistently <90 mL/min signify possible Chronic Kidney     Disease.   Anion gap 12  5 - 15  ETHANOL     Status: Abnormal   Collection Time    09/16/14 12:37 AM      Result Value Ref Range   Alcohol, Ethyl (B) 202 (*) 0 - 11 mg/dL   Comment:            LOWEST DETECTABLE LIMIT FOR     SERUM ALCOHOL IS 11 mg/dL     FOR MEDICAL PURPOSES ONLY  SALICYLATE LEVEL     Status: Abnormal   Collection Time    09/16/14 12:37 AM      Result Value Ref Range   Salicylate Lvl <8.9 (*) 2.8 - 20.0 mg/dL   Labs are reviewed see abnormal values above.  Labs discussed with patient.  Medications reviewed and no changes made  Current Facility-Administered Medications  Medication Dose Route Frequency Provider Last Rate Last Dose  . ibuprofen (ADVIL,MOTRIN) tablet 600 mg  600 mg Oral Q8H PRN Garald Balding, NP   600 mg at 09/16/14 1213  . nicotine (NICODERM CQ - dosed in mg/24 hours) patch 21 mg  21 mg Transdermal Daily Garald Balding, NP      . ondansetron [facility]) tablet 4 mg  4 mg Oral Q8H PRN Garald Balding, NP      . zolpidem (AMBIEN) tablet 5 mg  5 mg Oral QHS PRN Garald Balding, NP       Current Outpatient Prescriptions  Medication Sig Dispense Refill  . Multiple Vitamin (MULITIVITAMIN WITH MINERALS) TABS Take 1 tablet by mouth daily.      Marland Kitchen omeprazole (PRILOSEC OTC) 20 MG tablet Take 40 mg by mouth daily.        Psychiatric Specialty Exam:     Blood pressure 132/58, pulse 87, temperature 99.6 F (37.6 C), temperature source Oral, resp. rate 18, SpO2 99.00%.There is no weight on file to calculate BMI.  General Appearance: Disheveled  Eye Contact::  Good  Speech:  Clear and Coherent and Normal Rate  Volume:  Normal  Mood:  Appropriate  Affect:  Appropriate and Congruent  Thought  Process:  Circumstantial  Orientation:  Full (Time, Place, and Person)  Thought Content:  Rumination  Suicidal Thoughts:  No  Homicidal Thoughts:  No  Memory:  Immediate;   Good Recent;   Good Remote;   Good  Judgement:  Fair  Insight:  Fair  Psychomotor Activity:  Tremor  Concentration:  Fair  Recall:  Good  Fund of Knowledge:Good  Language: Good  Akathisia:  No  Handed:  Right  AIMS (if indicated):     Assets:  Communication Skills Desire for Improvement  Sleep:      Musculoskeletal: Strength & Muscle Tone: within normal limits Gait & Station: Patient sitting on bed.  Did not see patient ambulate.  Complaints of foot pain related to blisters on bottom of feet      Patient leans: N/A Consulted with TTS/SW and patient has bed at [facility] (shelter)   Dr. Adele Schilder agrees with treatment Treatment Plan Summary: Discharge home.  SW/TTS to work on shelter for patient and outpatient resources.  Patient to follow up with [facility].  Earleen Newport, FNP-BC 09/16/2014 12:42 PM  I have personally seen the patient and agreed with the findings and involved in the treatment plan. Berniece Andreas, MD

## 2014-09-16 NOTE — ED Notes (Signed)
Patient belongings include the following:   Bag one: Pair of boots and pair of socks  Bag two (black book bag) Pair of gloves Lunch box Cigar Two pair glasses, one inside a case Two writing pens Flash light Bottle of omeprazole, Rx Black jacket, has two snickers, one cell phone (flip phone)   Bag three: camo ball cap Plaid green shirt, long sleeve Blue jeans with black belt, keys attached to belt Receipt Wallet with various cards and papers  Pair of boxers [place] t shirt Cigarettes Urine cup with $6.00 in cash, wrist watch, change, silver ring.    These belongings were searched by Merri Brunette, RN and Alveria Apley, EMT on 09/16/14.   Belongings are at [place] at this time.

## 2014-09-16 NOTE — ED Notes (Signed)
Pt ambulatory to dc window w/ mHt, belongings returned after leaving the unit.
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
## 2014-09-16 NOTE — Discharge Instructions (Signed)

## 2014-09-16 NOTE — ED Notes (Signed)
Up to the bathroom 

## 2014-09-22 ENCOUNTER — Emergency Department (HOSPITAL_COMMUNITY): Payer: Medicaid Other

## 2014-09-22 ENCOUNTER — Encounter (HOSPITAL_COMMUNITY): Payer: Self-pay | Admitting: Emergency Medicine

## 2014-09-22 ENCOUNTER — Emergency Department (HOSPITAL_COMMUNITY)
Admission: EM | Admit: 2014-09-22 | Discharge: 2014-09-22 | Disposition: A | Discharge status: Home / Self Care | Payer: Medicaid Other | Attending: Emergency Medicine | Admitting: Emergency Medicine

## 2014-09-22 DIAGNOSIS — Z87828 Personal history of other (healed) physical injury and trauma: Secondary | ICD-10-CM | POA: Diagnosis not present

## 2014-09-22 DIAGNOSIS — E860 Dehydration: Secondary | ICD-10-CM

## 2014-09-22 DIAGNOSIS — Z8619 Personal history of other infectious and parasitic diseases: Secondary | ICD-10-CM | POA: Insufficient documentation

## 2014-09-22 DIAGNOSIS — R519 Headache, unspecified: Secondary | ICD-10-CM

## 2014-09-22 DIAGNOSIS — Z720 Tobacco use: Secondary | ICD-10-CM | POA: Insufficient documentation

## 2014-09-22 DIAGNOSIS — R51 Headache: Secondary | ICD-10-CM | POA: Insufficient documentation

## 2014-09-22 DIAGNOSIS — R42 Dizziness and giddiness: Secondary | ICD-10-CM | POA: Diagnosis present

## 2014-09-22 DIAGNOSIS — Z79899 Other long term (current) drug therapy: Secondary | ICD-10-CM | POA: Insufficient documentation

## 2014-09-22 DIAGNOSIS — K219 Gastro-esophageal reflux disease without esophagitis: Secondary | ICD-10-CM | POA: Diagnosis not present

## 2014-09-22 LAB — CBC WITH DIFFERENTIAL/PLATELET
BASOS ABS: 0 10*3/uL (ref 0.0–0.1)
Basophils Relative: 0 % (ref 0–1)
EOS PCT: 0 % (ref 0–5)
Eosinophils Absolute: 0 10*3/uL (ref 0.0–0.7)
HCT: 35.8 % — ABNORMAL LOW (ref 39.0–52.0)
Hemoglobin: 12.4 g/dL — ABNORMAL LOW (ref 13.0–17.0)
LYMPHS PCT: 15 % (ref 12–46)
Lymphs Abs: 0.3 10*3/uL — ABNORMAL LOW (ref 0.7–4.0)
MCH: 34.8 pg — AB (ref 26.0–34.0)
MCHC: 34.6 g/dL (ref 30.0–36.0)
MCV: 100.6 fL — ABNORMAL HIGH (ref 78.0–100.0)
Monocytes Absolute: 0.2 10*3/uL (ref 0.1–1.0)
Monocytes Relative: 7 % (ref 3–12)
Neutro Abs: 1.7 10*3/uL (ref 1.7–7.7)
Neutrophils Relative %: 78 % — ABNORMAL HIGH (ref 43–77)
PLATELETS: 39 10*3/uL — AB (ref 150–400)
RBC: 3.56 MIL/uL — ABNORMAL LOW (ref 4.22–5.81)
RDW: 16 % — ABNORMAL HIGH (ref 11.5–15.5)
WBC: 2.2 10*3/uL — AB (ref 4.0–10.5)

## 2014-09-22 LAB — COMPREHENSIVE METABOLIC PANEL
ALBUMIN: 2.9 g/dL — AB (ref 3.5–5.2)
ALT: 43 U/L (ref 0–53)
AST: 68 U/L — AB (ref 0–37)
Alkaline Phosphatase: 118 U/L — ABNORMAL HIGH (ref 39–117)
Anion gap: 10 (ref 5–15)
BUN: 14 mg/dL (ref 6–23)
CALCIUM: 8.4 mg/dL (ref 8.4–10.5)
CO2: 23 mEq/L (ref 19–32)
CREATININE: 0.88 mg/dL (ref 0.50–1.35)
Chloride: 109 mEq/L (ref 96–112)
GFR calc Af Amer: >90 mL/min (ref >90)
GFR calc non Af Amer: >90 mL/min (ref >90)
Glucose, Bld: 149 mg/dL — ABNORMAL HIGH (ref 70–99)
Potassium: 4 mEq/L (ref 3.7–5.3)
SODIUM: 142 meq/L (ref 137–147)
TOTAL PROTEIN: 6.7 g/dL (ref 6.0–8.3)
Total Bilirubin: 1.5 mg/dL — ABNORMAL HIGH (ref 0.3–1.2)

## 2014-09-22 LAB — APTT: aPTT: 35 seconds (ref 24–37)

## 2014-09-22 LAB — RAPID URINE DRUG SCREEN, HOSP PERFORMED
Amphetamines: NOT DETECTED
BARBITURATES: NOT DETECTED
Benzodiazepines: NOT DETECTED
COCAINE: NOT DETECTED
OPIATES: NOT DETECTED
TETRAHYDROCANNABINOL: NOT DETECTED

## 2014-09-22 LAB — ETHANOL

## 2014-09-22 LAB — PROTIME-INR
INR: 1.44 (ref 0.00–1.49)
PROTHROMBIN TIME: 17.7 s — AB (ref 11.6–15.2)

## 2014-09-22 MED ORDER — KETOROLAC TROMETHAMINE 30 MG/ML IJ SOLN
30.0000 mg | Freq: Once | INTRAMUSCULAR | Status: AC
  Administered 2014-09-22: 30 mg via INTRAVENOUS
  Filled 2014-09-22: qty 1

## 2014-09-22 MED ORDER — SODIUM CHLORIDE 0.9 % IV BOLUS (SEPSIS)
1000.0000 mL | Freq: Once | INTRAVENOUS | Status: AC
  Administered 2014-09-22: 1000 mL via INTRAVENOUS

## 2014-09-22 MED ORDER — SODIUM CHLORIDE 0.9 % IV SOLN
INTRAVENOUS | Status: DC
  Administered 2014-09-22: 100 mL via INTRAVENOUS

## 2014-09-22 MED ORDER — DIPHENHYDRAMINE HCL 50 MG/ML IJ SOLN
25.0000 mg | Freq: Once | INTRAMUSCULAR | Status: AC
  Administered 2014-09-22: 25 mg via INTRAVENOUS
  Filled 2014-09-22: qty 1

## 2014-09-22 MED ORDER — METOCLOPRAMIDE HCL 5 MG/ML IJ SOLN
10.0000 mg | Freq: Once | INTRAMUSCULAR | Status: AC
  Administered 2014-09-22: 10 mg via INTRAVENOUS
  Filled 2014-09-22: qty 2

## 2014-09-22 NOTE — Discharge Instructions (Signed)
Drink plenty of fluids. Return to the ED as needed.

## 2014-09-22 NOTE — ED Notes (Signed)
He states he is a resident of the [facility] because he is homeless.  He c/o "started to feel weak yesterday and then I got dizzy and then got a headache", sll of which persist.  He is in no distress.  He states he has had four episodes of emeses in the past 24 hours and denies diarrhea.

## 2014-09-22 NOTE — ED Provider Notes (Signed)
CSN: [id number]     Arrival date & time 09/22/14  8676 History   First MD Initiated Contact with Patient 09/22/14 1055     Chief Complaint  Patient presents with  . Dizziness     (Consider location/radiation/quality/duration/timing/severity/associated sxs/prior Treatment) HPI Patient reports he had some dizziness last night. He states dizziness means he feels like he might pass out. He states he was awakened at 2 AM with severe frontal headache that he describes as constant and throbbing. He's had nausea with vomiting x5. He denies any numbness or tingling of his extremities. He states nothing makes the headache worse, nothing makes the headache feel better including changing positions. He states he's having trouble walking because of the dizziness. She denies spinning. He denies neck pain, cough, rhinorrhea, or sneezing. He states he normally does not have headaches. He states the last time he fell was about a year ago. Looking at patient's past medical history he has a history of alcohol abuse with cirrhosis and had a subdural hematoma in December 2013.  PCP Dr Sammuel Bailiff  Past Medical History  Diagnosis Date  . Cirrhosis of liver   . Hepatitis C 03-28-12    ? 80's-prev. IV drug abuse  . 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 Blood transfusion 03-28-12    '81-hx. of bleeding ulcer  . History of bleeding ulcers 03-28-12    Hx. '81  . GERD (gastroesophageal reflux disease) 03-26-12    Hx. reflux. ulcers in past, occ. OTC med  . Incisional hernia     has drainage system from abdomin  . Subdural hematoma 12/13/12   Past Surgical History  Procedure Laterality Date  . Umbilical hernia repair    . Hernia repair  08/24/11    ventral hernia with mesh  . Hernia repair  19'50    Umbilical  . Peptic ulcer  03-26-12    open peptic ulcer surgery repair- '81  . Incisional hernia repair  03/26/2012  . Incisional hernia repair  03/30/2012    Procedure: LAPAROSCOPIC  INCISIONAL HERNIA;  Surgeon: Harl Bowie, MD;  Location: [facility];  Service: General;  Laterality: N/A;  Laparoscopic Incisional/ventral Hernia Repair with Mesh  . Tonsillectomy    . Appendectomy    . Incisional hernia repair  07/20/2012    mesh  . Incisional hernia repair  07/20/2012    Procedure: HERNIA REPAIR INCISIONAL;  Surgeon: Harl Bowie, MD;  Location: [place];  Service: General;  Laterality: N/A;  repair of incisional hernia with Biologic mesh   Family History  Problem Relation Age of Onset  . Cancer Mother     breast  . Cancer Father     brain  . Heart disease Brother   . Cancer Sister     breast  . Cancer Sister     breast   History  Substance Use Topics  . Smoking status: Current Every Day Smoker -- 38 years    Types: Cigarettes  . Smokeless tobacco: Never Used  . Alcohol Use: 21.0 oz/week    35 Cans of beer per week     Comment: on weekends   homeless On disability for hernias Smokes 1/3 ppd  States he drinks 1 beer twice weekly  Review of Systems  All other systems reviewed and are negative.     Allergies  Review of patient's allergies indicates no known allergies.  Home Medications   Prior to Admission medications   Medication Sig Start Date  End Date Taking? Authorizing Provider  Multiple Vitamin (MULITIVITAMIN WITH MINERALS) TABS Take 1 tablet by mouth daily.   Yes Historical Provider, MD  Naproxen Sodium (ALEVE) 220 MG CAPS Take 400 mg by mouth once as needed (pain).   Yes Historical Provider, MD  omeprazole (PRILOSEC OTC) 20 MG tablet Take 40 mg by mouth daily.   Yes Historical Provider, MD   BP 149/70  Pulse 81  Temp(Src) 97.6 F (36.4 C) (Oral)  Resp 17  SpO2 98%  Vital signs normal   Physical Exam  Nursing note and vitals reviewed. Constitutional: He is oriented to person, place, and time. He appears well-developed and well-nourished.  Non-toxic appearance. He does not appear ill. No distress.  HENT:  Head: Normocephalic and  atraumatic.  Right Ear: External ear normal.  Left Ear: External ear normal.  Nose: Nose normal. No mucosal edema or rhinorrhea.  Mouth/Throat: Oropharynx is clear and moist and mucous membranes are normal. No dental abscesses or uvula swelling.  Eyes: Conjunctivae and EOM are normal. Pupils are equal, round, and reactive to light.  Neck: Normal range of motion and full passive range of motion without pain. Neck supple.  Cardiovascular: Normal rate, regular rhythm and normal heart sounds.  Exam reveals no gallop and no friction rub.   No murmur heard. Pulmonary/Chest: Effort normal and breath sounds normal. No respiratory distress. He has no wheezes. He has no rhonchi. He has no rales. He exhibits no tenderness and no crepitus.  Abdominal: Soft. Normal appearance and bowel sounds are normal. He exhibits no distension. There is no tenderness. There is no rebound and no guarding.  Musculoskeletal: Normal range of motion. He exhibits no edema and no tenderness.  Moves all extremities well.   Neurological: He is alert and oriented to person, place, and time. He has normal strength. No cranial nerve deficit.  No facial asymmetry noted, no pronator drift, patient has equal grips. Patient has intact finger to nose however he has mild tremor which he has at rest.  Skin: Skin is warm, dry and intact. No rash noted. No erythema. No pallor.  Psychiatric: He has a normal mood and affect. His speech is normal and behavior is normal. His mood appears not anxious.    ED Course  Procedures (including critical care time)  Medications  0.9 %  sodium chloride infusion ( Intravenous Stopped 09/22/14 1415)  metoCLOPramide (REGLAN) injection 10 mg (10 mg Intravenous Given 09/22/14 1218)  diphenhydrAMINE (BENADRYL) injection 25 mg (25 mg Intravenous Given 09/22/14 1218)  ketorolac (TORADOL) 30 MG/ML injection 30 mg (30 mg Intravenous Given 09/22/14 1330)  sodium chloride 0.9 % bolus 1,000 mL (1,000 mLs  Intravenous New Bag/Given 09/22/14 1415)   13:20  Recheck, sleeping, states his headache is alittle better, but still hurting. Pt given IV toradol and more fluids.  Recheck 14:30 pt is feeling better, finishing his bolus of NS.    Labs Review Results for orders placed during the hospital encounter of 09/22/14  CBC WITH DIFFERENTIAL      Result Value Ref Range   WBC 2.2 (*) 4.0 - 10.5 K/uL   RBC 3.56 (*) 4.22 - 5.81 MIL/uL   Hemoglobin 12.4 (*) 13.0 - 17.0 g/dL   HCT 35.8 (*) 39.0 - 52.0 %   MCV 100.6 (*) 78.0 - 100.0 fL   MCH 34.8 (*) 26.0 - 34.0 pg   MCHC 34.6  30.0 - 36.0 g/dL   RDW 16.0 (*) 11.5 - 15.5 %   Platelets 39 (*)  150 - 400 K/uL   Neutrophils Relative % 78 (*) 43 - 77 %   Neutro Abs 1.7  1.7 - 7.7 K/uL   Lymphocytes Relative 15  12 - 46 %   Lymphs Abs 0.3 (*) 0.7 - 4.0 K/uL   Monocytes Relative 7  3 - 12 %   Monocytes Absolute 0.2  0.1 - 1.0 K/uL   Eosinophils Relative 0  0 - 5 %   Eosinophils Absolute 0.0  0.0 - 0.7 K/uL   Basophils Relative 0  0 - 1 %   Basophils Absolute 0.0  0.0 - 0.1 K/uL  COMPREHENSIVE METABOLIC PANEL      Result Value Ref Range   Sodium 142  137 - 147 mEq/L   Potassium 4.0  3.7 - 5.3 mEq/L   Chloride 109  96 - 112 mEq/L   CO2 23  19 - 32 mEq/L   Glucose, Bld 149 (*) 70 - 99 mg/dL   BUN 14  6 - 23 mg/dL   Creatinine, Ser 0.88  0.50 - 1.35 mg/dL   Calcium 8.4  8.4 - 10.5 mg/dL   Total Protein 6.7  6.0 - 8.3 g/dL   Albumin 2.9 (*) 3.5 - 5.2 g/dL   AST 68 (*) 0 - 37 U/L   ALT 43  0 - 53 U/L   Alkaline Phosphatase 118 (*) 39 - 117 U/L   Total Bilirubin 1.5 (*) 0.3 - 1.2 mg/dL   GFR calc non Af Amer >90  >90 mL/min   GFR calc Af Amer >90  >90 mL/min   Anion gap 10  5 - 15  ETHANOL      Result Value Ref Range   Alcohol, Ethyl (B) <11  0 - 11 mg/dL  APTT      Result Value Ref Range   aPTT 35  24 - 37 seconds  PROTIME-INR      Result Value Ref Range   Prothrombin Time 17.7 (*) 11.6 - 15.2 seconds   INR 1.44  0.00 - 1.49  URINE  RAPID DRUG SCREEN (HOSP PERFORMED)      Result Value Ref Range   Opiates NONE DETECTED  NONE DETECTED   Cocaine NONE DETECTED  NONE DETECTED   Benzodiazepines NONE DETECTED  NONE DETECTED   Amphetamines NONE DETECTED  NONE DETECTED   Tetrahydrocannabinol NONE DETECTED  NONE DETECTED   Barbiturates NONE DETECTED  NONE DETECTED    Laboratory interpretation all normal except mild anemia    Imaging Review Ct Head Wo Contrast  09/22/2014   CLINICAL DATA:  Headache, dizziness  EXAM: CT HEAD WITHOUT CONTRAST  TECHNIQUE: Contiguous axial images were obtained from the base of the skull through the vertex without intravenous contrast.  COMPARISON:  12/17/2013  FINDINGS: No skull fracture is noted. Paranasal sinuses and mastoid air cells are unremarkable. No intracranial hemorrhage, mass effect or midline shift. Mild cerebral atrophy. No mass lesion is noted on this unenhanced scan. No acute cortical infarction.  IMPRESSION: No acute intracranial abnormality.  Mild atrophy.   Electronically Signed   By: Lahoma Crocker M.D.   On: 09/22/2014 11:40     EKG Interpretation None      MDM   Final diagnoses:  Headache, unspecified headache type  Dehydration    Plan discharge  Rolland Porter, MD, Alanson Aly, MD 09/22/14 1440

## 2014-12-15 ENCOUNTER — Emergency Department (HOSPITAL_COMMUNITY): Payer: Medicaid Other

## 2014-12-15 ENCOUNTER — Emergency Department (HOSPITAL_COMMUNITY)
Admission: EM | Admit: 2014-12-15 | Discharge: 2014-12-15 | Disposition: A | Discharge status: Home / Self Care | Payer: Medicaid Other | Attending: Emergency Medicine | Admitting: Emergency Medicine

## 2014-12-15 DIAGNOSIS — Y9289 Other specified places as the place of occurrence of the external cause: Secondary | ICD-10-CM | POA: Insufficient documentation

## 2014-12-15 DIAGNOSIS — Y9389 Activity, other specified: Secondary | ICD-10-CM | POA: Insufficient documentation

## 2014-12-15 DIAGNOSIS — D61818 Other pancytopenia: Secondary | ICD-10-CM | POA: Diagnosis not present

## 2014-12-15 DIAGNOSIS — S0990XA Unspecified injury of head, initial encounter: Secondary | ICD-10-CM | POA: Insufficient documentation

## 2014-12-15 DIAGNOSIS — S199XXA Unspecified injury of neck, initial encounter: Secondary | ICD-10-CM | POA: Diagnosis not present

## 2014-12-15 DIAGNOSIS — Z79899 Other long term (current) drug therapy: Secondary | ICD-10-CM | POA: Insufficient documentation

## 2014-12-15 DIAGNOSIS — Z8619 Personal history of other infectious and parasitic diseases: Secondary | ICD-10-CM | POA: Insufficient documentation

## 2014-12-15 DIAGNOSIS — Z720 Tobacco use: Secondary | ICD-10-CM | POA: Diagnosis not present

## 2014-12-15 DIAGNOSIS — Z8719 Personal history of other diseases of the digestive system: Secondary | ICD-10-CM | POA: Insufficient documentation

## 2014-12-15 DIAGNOSIS — K921 Melena: Secondary | ICD-10-CM

## 2014-12-15 DIAGNOSIS — Z8679 Personal history of other diseases of the circulatory system: Secondary | ICD-10-CM | POA: Diagnosis not present

## 2014-12-15 DIAGNOSIS — R195 Other fecal abnormalities: Secondary | ICD-10-CM | POA: Diagnosis not present

## 2014-12-15 DIAGNOSIS — Y998 Other external cause status: Secondary | ICD-10-CM | POA: Insufficient documentation

## 2014-12-15 DIAGNOSIS — M542 Cervicalgia: Secondary | ICD-10-CM

## 2014-12-15 LAB — BASIC METABOLIC PANEL
Anion gap: 8 (ref 5–15)
BUN: 11 mg/dL (ref 6–23)
CHLORIDE: 111 meq/L (ref 96–112)
CO2: 23 mmol/L (ref 19–32)
CREATININE: 0.97 mg/dL (ref 0.50–1.35)
Calcium: 8.4 mg/dL (ref 8.4–10.5)
GFR calc Af Amer: >90 mL/min (ref >90)
GFR calc non Af Amer: 89 mL/min — ABNORMAL LOW (ref >90)
GLUCOSE: 106 mg/dL — AB (ref 70–99)
POTASSIUM: 3.7 mmol/L (ref 3.5–5.1)
Sodium: 142 mmol/L (ref 135–145)

## 2014-12-15 LAB — CBC WITH DIFFERENTIAL/PLATELET
BASOS PCT: 1 % (ref 0–1)
Basophils Absolute: 0 10*3/uL (ref 0.0–0.1)
Eosinophils Absolute: 0.1 10*3/uL (ref 0.0–0.7)
Eosinophils Relative: 3 % (ref 0–5)
HEMATOCRIT: 35.5 % — AB (ref 39.0–52.0)
Hemoglobin: 12.5 g/dL — ABNORMAL LOW (ref 13.0–17.0)
LYMPHS ABS: 1 10*3/uL (ref 0.7–4.0)
Lymphocytes Relative: 28 % (ref 12–46)
MCH: 34.2 pg — AB (ref 26.0–34.0)
MCHC: 35.2 g/dL (ref 30.0–36.0)
MCV: 97 fL (ref 78.0–100.0)
Monocytes Absolute: 0.3 10*3/uL (ref 0.1–1.0)
Monocytes Relative: 8 % (ref 3–12)
NEUTROS ABS: 2.2 10*3/uL (ref 1.7–7.7)
NEUTROS PCT: 61 % (ref 43–77)
Platelets: 39 10*3/uL — ABNORMAL LOW (ref 150–400)
RBC: 3.66 MIL/uL — ABNORMAL LOW (ref 4.22–5.81)
RDW: 15.2 % (ref 11.5–15.5)
WBC: 3.6 10*3/uL — ABNORMAL LOW (ref 4.0–10.5)

## 2014-12-15 LAB — I-STAT CHEM 8, ED
BUN: 12 mg/dL (ref 6–23)
CALCIUM ION: 1.14 mmol/L (ref 1.12–1.23)
CHLORIDE: 107 meq/L (ref 96–112)
CREATININE: 1.3 mg/dL (ref 0.50–1.35)
GLUCOSE: 99 mg/dL (ref 70–99)
HCT: 37 % — ABNORMAL LOW (ref 39.0–52.0)
Hemoglobin: 12.6 g/dL — ABNORMAL LOW (ref 13.0–17.0)
POTASSIUM: 3.7 mmol/L (ref 3.5–5.1)
Sodium: 146 mmol/L — ABNORMAL HIGH (ref 135–145)
TCO2: 21 mmol/L (ref 0–100)
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
## 2014-12-15 NOTE — Discharge Instructions (Signed)
If you were given medicines take as directed.  If you are on coumadin or contraceptives realize their levels and effectiveness is altered by many different medicines.  If you have any reaction (rash, tongues swelling, other) to the medicines stop taking and see a physician.   Please follow up as directed and return to the ER or see a physician for new or worsening symptoms.  Thank you. Filed Vitals:   12/15/14 1748 12/15/14 1800 12/15/14 1815 12/15/14 1846  BP: 141/63 142/59 131/58 134/56  Pulse: 87 85 92 92  Temp:      TempSrc:      Resp:  15 13 11   SpO2:  98% 99% 98%

## 2014-12-15 NOTE — ED Notes (Signed)
Signature pad in room not working, pt reports he understands discharge instructions.
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
## 2014-12-17 LAB — POC OCCULT BLOOD, ED: Fecal Occult Bld: NEGATIVE

## 2015-01-14 ENCOUNTER — Encounter (HOSPITAL_COMMUNITY): Payer: Self-pay | Admitting: Emergency Medicine

## 2015-01-14 ENCOUNTER — Emergency Department (HOSPITAL_COMMUNITY)
Admission: EM | Admit: 2015-01-14 | Discharge: 2015-01-14 | Disposition: A | Discharge status: Other Acute Inpatient Hospital | Payer: Medicaid Other | Attending: Emergency Medicine | Admitting: Emergency Medicine

## 2015-01-14 DIAGNOSIS — Z8679 Personal history of other diseases of the circulatory system: Secondary | ICD-10-CM | POA: Insufficient documentation

## 2015-01-14 DIAGNOSIS — F1410 Cocaine abuse, uncomplicated: Secondary | ICD-10-CM | POA: Insufficient documentation

## 2015-01-14 DIAGNOSIS — Z8719 Personal history of other diseases of the digestive system: Secondary | ICD-10-CM | POA: Diagnosis not present

## 2015-01-14 DIAGNOSIS — Z720 Tobacco use: Secondary | ICD-10-CM | POA: Diagnosis not present

## 2015-01-14 DIAGNOSIS — Z8619 Personal history of other infectious and parasitic diseases: Secondary | ICD-10-CM | POA: Insufficient documentation

## 2015-01-14 DIAGNOSIS — F1010 Alcohol abuse, uncomplicated: Secondary | ICD-10-CM | POA: Diagnosis present

## 2015-01-14 DIAGNOSIS — F10920 Alcohol use, unspecified with intoxication, uncomplicated: Secondary | ICD-10-CM | POA: Diagnosis not present

## 2015-01-14 LAB — URINE MICROSCOPIC-ADD ON

## 2015-01-14 LAB — RAPID URINE DRUG SCREEN, HOSP PERFORMED
AMPHETAMINES: NOT DETECTED
BENZODIAZEPINES: NOT DETECTED
Barbiturates: NOT DETECTED
Cocaine: POSITIVE — AB
OPIATES: NOT DETECTED
TETRAHYDROCANNABINOL: NOT DETECTED

## 2015-01-14 LAB — URINALYSIS, ROUTINE W REFLEX MICROSCOPIC
BILIRUBIN URINE: NEGATIVE
Glucose, UA: NEGATIVE mg/dL
Ketones, ur: NEGATIVE mg/dL
Leukocytes, UA: NEGATIVE
Nitrite: NEGATIVE
Protein, ur: NEGATIVE mg/dL
SPECIFIC GRAVITY, URINE: 1.009 (ref 1.005–1.030)
Urobilinogen, UA: 2 mg/dL — ABNORMAL HIGH (ref 0.0–1.0)
pH: 6 (ref 5.0–8.0)

## 2015-01-14 LAB — CBC WITH DIFFERENTIAL/PLATELET
Basophils Absolute: 0 10*3/uL (ref 0.0–0.1)
Basophils Relative: 0 % (ref 0–1)
Eosinophils Absolute: 0.2 10*3/uL (ref 0.0–0.7)
Eosinophils Relative: 5 % (ref 0–5)
HCT: 35.6 % — ABNORMAL LOW (ref 39.0–52.0)
HEMOGLOBIN: 12.7 g/dL — AB (ref 13.0–17.0)
Lymphocytes Relative: 36 % (ref 12–46)
Lymphs Abs: 1.6 10*3/uL (ref 0.7–4.0)
MCH: 33.9 pg (ref 26.0–34.0)
MCHC: 35.7 g/dL (ref 30.0–36.0)
MCV: 94.9 fL (ref 78.0–100.0)
MONOS PCT: 9 % (ref 3–12)
Monocytes Absolute: 0.4 10*3/uL (ref 0.1–1.0)
NEUTROS PCT: 50 % (ref 43–77)
Neutro Abs: 2.2 10*3/uL (ref 1.7–7.7)
PLATELETS: 41 10*3/uL — AB (ref 150–400)
RBC: 3.75 MIL/uL — AB (ref 4.22–5.81)
RDW: 14.8 % (ref 11.5–15.5)
WBC: 4.3 10*3/uL (ref 4.0–10.5)

## 2015-01-14 LAB — COMPREHENSIVE METABOLIC PANEL
ALBUMIN: 2.9 g/dL — AB (ref 3.5–5.2)
ALT: 42 U/L (ref 0–53)
ANION GAP: 8 (ref 5–15)
AST: 84 U/L — ABNORMAL HIGH (ref 0–37)
Alkaline Phosphatase: 68 U/L (ref 39–117)
BUN: 13 mg/dL (ref 6–23)
CALCIUM: 8.3 mg/dL — AB (ref 8.4–10.5)
CHLORIDE: 109 mmol/L (ref 96–112)
CO2: 25 mmol/L (ref 19–32)
Creatinine, Ser: 1.19 mg/dL (ref 0.50–1.35)
GFR, EST AFRICAN AMERICAN: 76 mL/min — AB (ref >90)
GFR, EST NON AFRICAN AMERICAN: 65 mL/min — AB (ref >90)
GLUCOSE: 109 mg/dL — AB (ref 70–99)
POTASSIUM: 3.8 mmol/L (ref 3.5–5.1)
SODIUM: 142 mmol/L (ref 135–145)
TOTAL PROTEIN: 6.2 g/dL (ref 6.0–8.3)
Total Bilirubin: 1.3 mg/dL — ABNORMAL HIGH (ref 0.3–1.2)

## 2015-01-14 LAB — MAGNESIUM: Magnesium: 1.8 mg/dL (ref 1.5–2.5)

## 2015-01-14 LAB — SALICYLATE LEVEL

## 2015-01-14 LAB — ACETAMINOPHEN LEVEL

## 2015-01-14 LAB — ETHANOL
ALCOHOL ETHYL (B): 267 mg/dL — AB (ref 0–9)
Alcohol, Ethyl (B): 163 mg/dL — ABNORMAL HIGH (ref 0–9)

## 2015-01-14 LAB — LIPASE, BLOOD: LIPASE: 45 U/L (ref 11–59)

## 2015-01-14 MED ORDER — VITAMIN B-1 100 MG PO TABS
100.0000 mg | ORAL_TABLET | Freq: Every day | ORAL | Status: DC
  Administered 2015-01-14: 100 mg via ORAL
  Filled 2015-01-14: qty 1

## 2015-01-14 MED ORDER — ALUM & MAG HYDROXIDE-SIMETH 200-200-20 MG/5ML PO SUSP: 30.0000 mL | ORAL | Status: DC | PRN

## 2015-01-14 MED ORDER — ONDANSETRON HCL 4 MG PO TABS: 4.0000 mg | ORAL_TABLET | Freq: Three times a day (TID) | ORAL | Status: DC | PRN

## 2015-01-14 MED ORDER — ZOLPIDEM TARTRATE 5 MG PO TABS
5.0000 mg | ORAL_TABLET | Freq: Every evening | ORAL | Status: DC | PRN
Start: 2015-01-14 — End: 2015-01-14

## 2015-01-14 MED ORDER — LORAZEPAM 1 MG PO TABS: 1.0000 mg | ORAL_TABLET | Freq: Three times a day (TID) | ORAL | Status: DC | PRN

## 2015-01-14 MED ORDER — LORAZEPAM 1 MG PO TABS: 0.0000 mg | ORAL_TABLET | Freq: Two times a day (BID) | ORAL | Status: DC

## 2015-01-14 MED ORDER — ACETAMINOPHEN 325 MG PO TABS: 650.0000 mg | ORAL_TABLET | ORAL | Status: DC | PRN

## 2015-01-14 MED ORDER — NICOTINE 21 MG/24HR TD PT24
21.0000 mg | MEDICATED_PATCH | Freq: Every day | TRANSDERMAL | Status: DC
  Administered 2015-01-14: 21 mg via TRANSDERMAL
  Filled 2015-01-14: qty 1

## 2015-01-14 MED ORDER — THIAMINE HCL 100 MG/ML IJ SOLN: 100.0000 mg | Freq: Every day | INTRAMUSCULAR | Status: DC

## 2015-01-14 MED ORDER — IBUPROFEN 400 MG PO TABS: 600.0000 mg | ORAL_TABLET | Freq: Three times a day (TID) | ORAL | Status: DC | PRN

## 2015-01-14 MED ORDER — LORAZEPAM 1 MG PO TABS: 0.0000 mg | ORAL_TABLET | Freq: Four times a day (QID) | ORAL | Status: DC

## 2015-01-14 NOTE — ED Notes (Signed)
Called [facility] and spoke to Noah Medina. Hoyle Sauer advises that the patient has been accepted and can come when their bac is less than 140. She does request to be notified of the repeat bac result, and again when the patient is ready to depart for their facility

## 2015-01-14 NOTE — ED Notes (Signed)
CALLED [facility]. THEY ADVISE THAT THEY DO HAVE MALE DETOX BEDS AVAILABLE. WILL FAX LABS TO THEM WHEN AVAILABLE. THEY DO ACCEPT MEDICAID FOR DETOX PATIENTS

## 2015-01-14 NOTE — BH Assessment (Signed)
Writer informed of the consult.  
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
## 2015-01-14 NOTE — ED Notes (Signed)
Patient has ambulated to pod c from pod d. He is going to be observed on pod c for sobriety and referred to [facility] when his bac is less than 140. Pt is aware and agreeable with the plan

## 2015-01-14 NOTE — Discharge Instructions (Signed)
Alcohol Intoxication °Alcohol intoxication occurs when you drink enough alcohol that it affects your ability to function. It can be mild or very severe. Drinking a lot of alcohol in a short time is called binge drinking. This can be very harmful. Drinking alcohol can also be more dangerous if you are taking medicines or other drugs. Some of the effects caused by alcohol may include: °· Loss of coordination. °· Changes in mood and behavior. °· Unclear thinking. °· Trouble talking (slurred speech). °· Throwing up (vomiting). °· Confusion. °· Slowed breathing. °· Twitching and shaking (seizures). °· Loss of consciousness. °HOME CARE °· Do not drive after drinking alcohol. °· Drink enough water and fluids to keep your pee (urine) clear or pale yellow. Avoid caffeine. °· Only take medicine as told by your doctor. °GET HELP IF: °· You throw up (vomit) many times. °· You do not feel better after a few days. °· You frequently have alcohol intoxication. Your doctor can help decide if you should see a substance use treatment counselor. °GET HELP RIGHT AWAY IF: °· You become shaky when you stop drinking. °· You have twitching and shaking. °· You throw up blood. It may look bright red or like coffee grounds. °· You notice blood in your poop (bowel movements). °· You become lightheaded or pass out (faint). °MAKE SURE YOU:  °· Understand these instructions. °· Will watch your condition. °· Will get help right away if you are not doing well or get worse. °Document Released: 05/18/2008 Document Revised: 08/02/2013 Document Reviewed: 05/05/2013 °[employer]® Patient Information ©2015 [employer]. This information is not intended to replace advice given to you by your health care provider. Make sure you discuss any questions you have with your health care provider. ° °
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
## 2015-01-14 NOTE — ED Notes (Signed)
Pt ambulated well to room from lobby.

## 2015-01-14 NOTE — ED Notes (Addendum)
Pt from home with request for alcohol detox.  Pt states he drinks 18 12 oz beers a day.  Drank 10 beers from 3 am to 7 am today.  Reports bowel and bladder incontinence x years. Pt A&O, NAD, ambulated to room.
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
## 2015-01-14 NOTE — ED Notes (Signed)
Sitter arrived to sit with patient.

## 2015-01-14 NOTE — ED Notes (Signed)
Pt states abdomen hurts but cannot put a number on the pain.

## 2015-01-14 NOTE — ED Notes (Signed)
Lab at bedside to draw repeat bac

## 2015-01-14 NOTE — ED Notes (Signed)
Pt belongings in safe (key placed with pt records). Pt belongings placed in containers and inventoried.

## 2015-01-14 NOTE — ED Notes (Signed)
Staffing notified of sitter need 

## 2015-01-14 NOTE — ED Notes (Signed)
CALLED TINA AT [facility] AND SHE WILL LET [facility] KNOW THE CONSULT HAS BEEN CANCELLED UNTIL PT IS MEDICALLY CLEARED/BAC IS BACK

## 2015-01-14 NOTE — ED Notes (Signed)
Pt placed in paper scrubs. Security gathering belongings

## 2015-01-14 NOTE — ED Provider Notes (Signed)
Pt medically clear for d/c to rehab facility. PE as below.  Constitutional: He is oriented to person, place, and time. He appears well-developed and well-nourished. No distress.  Eyes: Pupils are equal, round, and reactive to light.  Neck: Normal range of motion.  Cardiovascular: Normal rate, regular rhythm and normal heart sounds.  Pulmonary/Chest: Effort normal and breath sounds normal. No respiratory distress. He has no decreased breath sounds. He has no wheezes. He has no rhonchi. He has no rales.  Abdominal: Soft. He exhibits no distension. There is no tenderness. There is no rebound and no guarding.  Musculoskeletal: He exhibits no edema or tenderness.  Neurological: He is alert and oriented to person, place, and time. He exhibits normal muscle tone.  Skin: Skin is warm and dry.   Clinical Impression: 1. Alcohol intoxication, uncomplicated     Disposition: Discharge  Condition: Good  I have discussed the results, Dx and Tx plan with the pt(& family if present). He/she/they expressed understanding and agree(s) with the plan. Discharge instructions discussed at great length. Strict return precautions discussed and pt &/or family have verbalized understanding of the instructions. No further questions at time of discharge.   Pt seen in conjunction with Dr. Orpah Greek, *  Kirstie Peri, [facility] Resident - PGY-2    Kirstie Peri, MD 01/14/15 [id number]  Orpah Greek, MD 01/16/15 [phone]

## 2015-03-01 ENCOUNTER — Encounter (HOSPITAL_COMMUNITY): Payer: Self-pay

## 2015-03-01 ENCOUNTER — Emergency Department (HOSPITAL_COMMUNITY): Payer: Medicaid Other

## 2015-03-01 ENCOUNTER — Emergency Department (HOSPITAL_COMMUNITY)
Admission: EM | Admit: 2015-03-01 | Discharge: 2015-03-02 | Disposition: A | Discharge status: Home / Self Care | Payer: Medicaid Other | Source: Home / Self Care | Attending: Emergency Medicine | Admitting: Emergency Medicine

## 2015-03-01 DIAGNOSIS — F10120 Alcohol abuse with intoxication, uncomplicated: Secondary | ICD-10-CM | POA: Insufficient documentation

## 2015-03-01 DIAGNOSIS — Z8679 Personal history of other diseases of the circulatory system: Secondary | ICD-10-CM | POA: Insufficient documentation

## 2015-03-01 DIAGNOSIS — Z79899 Other long term (current) drug therapy: Secondary | ICD-10-CM

## 2015-03-01 DIAGNOSIS — R42 Dizziness and giddiness: Secondary | ICD-10-CM

## 2015-03-01 DIAGNOSIS — F10920 Alcohol use, unspecified with intoxication, uncomplicated: Secondary | ICD-10-CM

## 2015-03-01 DIAGNOSIS — Z720 Tobacco use: Secondary | ICD-10-CM | POA: Insufficient documentation

## 2015-03-01 DIAGNOSIS — Z8619 Personal history of other infectious and parasitic diseases: Secondary | ICD-10-CM

## 2015-03-01 DIAGNOSIS — K219 Gastro-esophageal reflux disease without esophagitis: Secondary | ICD-10-CM | POA: Insufficient documentation

## 2015-03-01 LAB — I-STAT CHEM 8, ED
BUN: 10 mg/dL (ref 6–23)
CALCIUM ION: 1.09 mmol/L — AB (ref 1.12–1.23)
Chloride: 109 mmol/L (ref 96–112)
Creatinine, Ser: 1.3 mg/dL (ref 0.50–1.35)
Glucose, Bld: 85 mg/dL (ref 70–99)
HEMATOCRIT: 34 % — AB (ref 39.0–52.0)
Hemoglobin: 11.6 g/dL — ABNORMAL LOW (ref 13.0–17.0)
Potassium: 4.3 mmol/L (ref 3.5–5.1)
SODIUM: 145 mmol/L (ref 135–145)
TCO2: 21 mmol/L (ref 0–100)

## 2015-03-01 LAB — DIFFERENTIAL
BASOS ABS: 0 10*3/uL (ref 0.0–0.1)
Basophils Relative: 0 % (ref 0–1)
EOS ABS: 0.2 10*3/uL (ref 0.0–0.7)
EOS PCT: 5 % (ref 0–5)
Lymphocytes Relative: 30 % (ref 12–46)
Lymphs Abs: 1.1 10*3/uL (ref 0.7–4.0)
Monocytes Absolute: 0.4 10*3/uL (ref 0.1–1.0)
Monocytes Relative: 11 % (ref 3–12)
Neutro Abs: 2.1 10*3/uL (ref 1.7–7.7)
Neutrophils Relative %: 55 % (ref 43–77)

## 2015-03-01 LAB — URINALYSIS, ROUTINE W REFLEX MICROSCOPIC
Bilirubin Urine: NEGATIVE
GLUCOSE, UA: NEGATIVE mg/dL
Ketones, ur: NEGATIVE mg/dL
Leukocytes, UA: NEGATIVE
NITRITE: NEGATIVE
PH: 5.5 (ref 5.0–8.0)
Protein, ur: NEGATIVE mg/dL
SPECIFIC GRAVITY, URINE: 1.017 (ref 1.005–1.030)
Urobilinogen, UA: 1 mg/dL (ref 0.0–1.0)

## 2015-03-01 LAB — COMPREHENSIVE METABOLIC PANEL
ALT: 37 U/L (ref 0–53)
AST: 60 U/L — ABNORMAL HIGH (ref 0–37)
Albumin: 3 g/dL — ABNORMAL LOW (ref 3.5–5.2)
Alkaline Phosphatase: 85 U/L (ref 39–117)
Anion gap: 2 — ABNORMAL LOW (ref 5–15)
BILIRUBIN TOTAL: 1.1 mg/dL (ref 0.3–1.2)
BUN: 10 mg/dL (ref 6–23)
CALCIUM: 8.3 mg/dL — AB (ref 8.4–10.5)
CHLORIDE: 113 mmol/L — AB (ref 96–112)
CO2: 27 mmol/L (ref 19–32)
CREATININE: 1.09 mg/dL (ref 0.50–1.35)
GFR calc non Af Amer: 72 mL/min — ABNORMAL LOW (ref >90)
GFR, EST AFRICAN AMERICAN: 84 mL/min — AB (ref >90)
GLUCOSE: 89 mg/dL (ref 70–99)
Potassium: 3.7 mmol/L (ref 3.5–5.1)
Sodium: 142 mmol/L (ref 135–145)
Total Protein: 6.3 g/dL (ref 6.0–8.3)

## 2015-03-01 LAB — APTT: aPTT: 36 seconds (ref 24–37)

## 2015-03-01 LAB — RAPID URINE DRUG SCREEN, HOSP PERFORMED
AMPHETAMINES: NOT DETECTED
BARBITURATES: NOT DETECTED
BENZODIAZEPINES: NOT DETECTED
Cocaine: NOT DETECTED
Opiates: NOT DETECTED
Tetrahydrocannabinol: NOT DETECTED

## 2015-03-01 LAB — CBC
HCT: 33.1 % — ABNORMAL LOW (ref 39.0–52.0)
Hemoglobin: 11.5 g/dL — ABNORMAL LOW (ref 13.0–17.0)
MCH: 34.3 pg — ABNORMAL HIGH (ref 26.0–34.0)
MCHC: 34.7 g/dL (ref 30.0–36.0)
MCV: 98.8 fL (ref 78.0–100.0)
PLATELETS: 45 10*3/uL — AB (ref 150–400)
RBC: 3.35 MIL/uL — AB (ref 4.22–5.81)
RDW: 15.3 % (ref 11.5–15.5)
WBC: 3.9 10*3/uL — ABNORMAL LOW (ref 4.0–10.5)

## 2015-03-01 LAB — URINE MICROSCOPIC-ADD ON

## 2015-03-01 LAB — I-STAT TROPONIN, ED: Troponin i, poc: 0.03 ng/mL (ref 0.00–0.08)

## 2015-03-01 LAB — ETHANOL: Alcohol, Ethyl (B): 188 mg/dL — ABNORMAL HIGH (ref 0–9)

## 2015-03-01 LAB — PROTIME-INR
INR: 1.43 (ref 0.00–1.49)
PROTHROMBIN TIME: 17.6 s — AB (ref 11.6–15.2)

## 2015-03-01 NOTE — ED Notes (Signed)
Per EMS-pt c/o "dizziness, equilibrium, numb all over." ETOH use today -"says he only had a little cup of liquor today." VS: BP 142/64 HR 88 RR 18 CBG 244. A&Ox4. Ambulatory.

## 2015-03-01 NOTE — ED Provider Notes (Signed)
CSN: [id number]     Arrival date & time 03/01/15  1545 History   First MD Initiated Contact with Patient 03/01/15 2100     Chief Complaint  Patient presents with  . Dizziness  . Numbness     (Consider location/radiation/quality/duration/timing/severity/associated sxs/prior Treatment) HPI Comments: Intoxicated male brought by EMS with a 2 day history of lightheadedness, dizziness, diffuse numbness all over. Admits to drinking several drinks tonight after being clean for 40 days. He has a history of cirrhosis, hepatitis C, acid reflux. Endorses difficulty walking and feels like he is going to pass out. Denies chest pain or shortness of breath. He has a large ventral hernia which is nontender. Denies any nausea or vomiting. Denies abdominal pain or back pain.  The history is provided by the patient and the EMS personnel. The history is limited by the condition of the patient.    Past Medical History  Diagnosis Date  . Cirrhosis of liver   . Hepatitis C 03-28-12    ? 80's-prev. IV drug abuse  . 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 Blood transfusion 03-28-12    '81-hx. of bleeding ulcer  . History of bleeding ulcers 03-28-12    Hx. '81  . GERD (gastroesophageal reflux disease) 03-26-12    Hx. reflux. ulcers in past, occ. OTC med  . Incisional hernia     has drainage system from abdomin  . Subdural hematoma 12/13/12   Past Surgical History  Procedure Laterality Date  . Umbilical hernia repair    . Hernia repair  08/24/11    ventral hernia with mesh  . Hernia repair  09'98    Umbilical  . Peptic ulcer  03-26-12    open peptic ulcer surgery repair- '81  . Incisional hernia repair  03/26/2012  . Incisional hernia repair  03/30/2012    Procedure: LAPAROSCOPIC INCISIONAL HERNIA;  Surgeon: Harl Bowie, MD;  Location: [facility];  Service: General;  Laterality: N/A;  Laparoscopic Incisional/ventral Hernia Repair with Mesh  . Tonsillectomy    . Appendectomy    .  Incisional hernia repair  07/20/2012    mesh  . Incisional hernia repair  07/20/2012    Procedure: HERNIA REPAIR INCISIONAL;  Surgeon: Harl Bowie, MD;  Location: [facility];  Service: General;  Laterality: N/A;  repair of incisional hernia with Biologic mesh   Family History  Problem Relation Age of Onset  . Cancer Mother     breast  . Cancer Father     brain  . Heart disease Brother   . Cancer Sister     breast  . Cancer Sister     breast   History  Substance Use Topics  . Smoking status: Current Every Day Smoker -- 0.25 packs/day for 38 years    Types: Cigarettes  . Smokeless tobacco: Never Used  . Alcohol Use: 75.6 oz/week    126 Cans of beer per week    Review of Systems  Constitutional: Positive for fatigue. Negative for fever and activity change.  HENT: Negative for congestion and rhinorrhea.   Eyes: Negative for visual disturbance.  Respiratory: Negative for cough, chest tightness, shortness of breath and wheezing.   Cardiovascular: Negative for chest pain.  Gastrointestinal: Positive for nausea. Negative for vomiting and abdominal pain.  Genitourinary: Negative for dysuria and testicular pain.  Musculoskeletal: Negative for myalgias and arthralgias.  Skin: Negative for rash.  Neurological: Positive for dizziness, weakness and light-headedness. Negative for facial asymmetry, speech difficulty  and headaches.  A complete 10 system review of systems was obtained and all systems are negative except as noted in the HPI and PMH.      Allergies  Review of patient's allergies indicates no known allergies.  Home Medications   Prior to Admission medications   Medication Sig Start Date End Date Taking? Authorizing Provider  OMEPRAZOLE PO Take 1 capsule by mouth daily.   Yes Historical Provider, MD  traZODone (DESYREL) 150 MG tablet Take 150 mg by mouth at bedtime.   Yes Historical Provider, MD  TRAZODONE HCL PO Take 1 tablet by mouth at bedtime.     Historical Provider,  MD   BP 129/78 mmHg  Pulse 71  Temp(Src) 98.3 F (36.8 C) (Oral)  Resp 18  SpO2 96% Physical Exam  Constitutional: He is oriented to person, place, and time. He appears well-developed and well-nourished. No distress.  HENT:  Head: Normocephalic and atraumatic.  Mouth/Throat: Oropharynx is clear and moist. No oropharyngeal exudate.  Eyes: Conjunctivae and EOM are normal. Pupils are equal, round, and reactive to light.  Neck: Normal range of motion. Neck supple.  No meningismus.  Cardiovascular: Normal rate, regular rhythm, normal heart sounds and intact distal pulses.   No murmur heard. Pulmonary/Chest: Effort normal and breath sounds normal. No respiratory distress.  Abdominal: Soft. There is tenderness. There is no rebound and no guarding.  Large ventral hernia that is reducible with palpable area of scar tissue as documented previously. No evidence of incarceration.  Musculoskeletal: Normal range of motion. He exhibits edema. He exhibits no tenderness.  +2 pitting edema pretibial bilaterally  Neurological: He is alert and oriented to person, place, and time. No cranial nerve deficit. He exhibits normal muscle tone. Coordination normal.  No ataxia on finger to nose bilaterally. No pronator drift. 5/5 strength throughout. CN 2-12 intact. Positive romberg Equal grip strength. Sensation intact. Gait is ataxic. Tremors of upper extremities, chronic per patient  Skin: Skin is warm.  Psychiatric: He has a normal mood and affect. His behavior is normal.  Nursing note and vitals reviewed.   ED Course  Procedures (including critical care time) Labs Review Labs Reviewed  ETHANOL - Abnormal; Notable for the following:    Alcohol, Ethyl (B) 188 (*)    All other components within normal limits  PROTIME-INR - Abnormal; Notable for the following:    Prothrombin Time 17.6 (*)    All other components within normal limits  CBC - Abnormal; Notable for the following:    WBC 3.9 (*)    RBC  3.35 (*)    Hemoglobin 11.5 (*)    HCT 33.1 (*)    MCH 34.3 (*)    Platelets 45 (*)    All other components within normal limits  COMPREHENSIVE METABOLIC PANEL - Abnormal; Notable for the following:    Chloride 113 (*)    Calcium 8.3 (*)    Albumin 3.0 (*)    AST 60 (*)    GFR calc non Af Amer 72 (*)    GFR calc Af Amer 84 (*)    Anion gap 2 (*)    All other components within normal limits  URINALYSIS, ROUTINE W REFLEX MICROSCOPIC - Abnormal; Notable for the following:    Hgb urine dipstick TRACE (*)    All other components within normal limits  I-STAT CHEM 8, ED - Abnormal; Notable for the following:    Calcium, Ion 1.09 (*)    Hemoglobin 11.6 (*)    HCT  34.0 (*)    All other components within normal limits  APTT  DIFFERENTIAL  URINE RAPID DRUG SCREEN (HOSP PERFORMED)  URINE MICROSCOPIC-ADD ON  I-STAT TROPOININ, ED  I-STAT TROPOININ, ED    Imaging Review Ct Head Wo Contrast  03/01/2015   CLINICAL DATA:  59 year old male acute dizziness and numbness.  EXAM: CT HEAD WITHOUT CONTRAST  TECHNIQUE: Contiguous axial images were obtained from the base of the skull through the vertex without intravenous contrast.  COMPARISON:  12/15/2014 and prior head CTs  FINDINGS: Mild generalized atrophy and chronic small-vessel white matter ischemic changes are again noted.  No acute intracranial abnormalities are identified, including mass lesion or mass effect, hydrocephalus, extra-axial fluid collection, midline shift, hemorrhage, or acute infarction.  The visualized bony calvarium is unremarkable.  IMPRESSION: No evidence of acute intracranial abnormality.  Mild atrophy and chronic small-vessel white matter ischemic changes.   Electronically Signed   By: Margarette Canada M.D.   On: 03/01/2015 21:58   Mr Brain Wo Contrast  03/01/2015   CLINICAL DATA:  Blurry vision, essential tremor, dizziness, numbness for 5 years, progressively worsening. History of cirrhosis, hepatitis-C. Low  EXAM: MRI HEAD WITHOUT  CONTRAST  TECHNIQUE: Multiplanar, multiecho pulse sequences of the brain and surrounding structures were obtained without intravenous contrast.  COMPARISON:  CT of the head March 01, 2015 at 2115 hours and MRI of the brain August 17, 2013  FINDINGS: Moderately motion degraded examination, technologist for attributes motion to patient's known tremor.  No reduced diffusion to suggest acute ischemia. No susceptibility artifact to suggest hemorrhage. Ventricles and sulci are normal for patient's age. Patchy to confluent supratentorial white matter FLAIR T2 hyperintensities, advanced for age and, mildly advanced from prior MRI. No midline shift, mass effect or mass lesions.  No abnormal extra-axial fluid collections. Mildly to dolicoectatic intracranial vessels. Ocular globes and orbital contents are nonsuspicious though not tailored for evaluation. Trace RIGHT mastoid effusion. No abnormal sellar expansion. No cerebellar tonsillar ectopia. Patient is edentulous.  IMPRESSION: Motion degraded examination.  No acute intracranial process.  Moderate to severe matter changes, advanced for age and from prior imaging could reflect chronic small vessel ischemic disease. The dolichoectatic intracranial vessels which are associated with chronic hypertension.   Electronically Signed   By: Elon Alas   On: 03/01/2015 23:12     EKG Interpretation   Date/Time:  Friday March 01 2015 21:48:10 EDT Ventricular Rate:  90 PR Interval:  171 QRS Duration: 106 QT Interval:  370 QTC Calculation: 453 R Axis:   42 Text Interpretation:  Normal sinus rhythm No significant change was found  Confirmed by Wyvonnia Dusky  MD, Lucca Ballo [phone]) on 03/01/2015 10:00:35 PM      MDM   Final diagnoses:  Alcohol intoxication, uncomplicated  Dizziness    Patient with dizziness, difficulty walking, near-syncope since this morning. He appears intoxicated. No chest pain or shortness of breath. EKG is unchanged.  Romberg positive with  ataxic gait otherwise neuro intact. Large reducible ventral hernia  CT negative for acute pathology. MRI degraded by motion but does not show acute ischemia. ETOH 188.  Patient feeling improved and walking around room without difficulty. Tolerating PO.  Gait is no longer ataxic and he is feeling better. Denies dizziness or lightheadedness. No chest pain or SOB.  Suspect symptoms due to alcohol intoxication. He has a ride home and will be discharged.    Ezequiel Essex, MD 03/02/15 [phone]

## 2015-03-01 NOTE — ED Notes (Signed)
Patient transported to MRI 

## 2015-03-02 ENCOUNTER — Encounter (HOSPITAL_COMMUNITY): Payer: Self-pay | Admitting: Emergency Medicine

## 2015-03-02 ENCOUNTER — Emergency Department (HOSPITAL_COMMUNITY)
Admission: EM | Admit: 2015-03-02 | Discharge: 2015-03-03 | Disposition: A | Discharge status: Other Acute Inpatient Hospital | Payer: Medicaid Other | Source: Home / Self Care | Attending: Emergency Medicine | Admitting: Emergency Medicine

## 2015-03-02 DIAGNOSIS — Z720 Tobacco use: Secondary | ICD-10-CM | POA: Insufficient documentation

## 2015-03-02 DIAGNOSIS — F329 Major depressive disorder, single episode, unspecified: Secondary | ICD-10-CM | POA: Insufficient documentation

## 2015-03-02 DIAGNOSIS — Z8679 Personal history of other diseases of the circulatory system: Secondary | ICD-10-CM | POA: Insufficient documentation

## 2015-03-02 DIAGNOSIS — F32A Depression, unspecified: Secondary | ICD-10-CM

## 2015-03-02 DIAGNOSIS — F10120 Alcohol abuse with intoxication, uncomplicated: Secondary | ICD-10-CM | POA: Insufficient documentation

## 2015-03-02 DIAGNOSIS — Z8619 Personal history of other infectious and parasitic diseases: Secondary | ICD-10-CM | POA: Insufficient documentation

## 2015-03-02 DIAGNOSIS — Z79899 Other long term (current) drug therapy: Secondary | ICD-10-CM | POA: Insufficient documentation

## 2015-03-02 DIAGNOSIS — R45851 Suicidal ideations: Secondary | ICD-10-CM

## 2015-03-02 DIAGNOSIS — K219 Gastro-esophageal reflux disease without esophagitis: Secondary | ICD-10-CM | POA: Insufficient documentation

## 2015-03-02 DIAGNOSIS — R251 Tremor, unspecified: Secondary | ICD-10-CM | POA: Insufficient documentation

## 2015-03-02 DIAGNOSIS — F10920 Alcohol use, unspecified with intoxication, uncomplicated: Secondary | ICD-10-CM

## 2015-03-02 LAB — COMPREHENSIVE METABOLIC PANEL
ALBUMIN: 3.1 g/dL — AB (ref 3.5–5.2)
ALK PHOS: 82 U/L (ref 39–117)
ALT: 34 U/L (ref 0–53)
ANION GAP: 6 (ref 5–15)
AST: 58 U/L — AB (ref 0–37)
BILIRUBIN TOTAL: 1.6 mg/dL — AB (ref 0.3–1.2)
BUN: 10 mg/dL (ref 6–23)
CO2: 23 mmol/L (ref 19–32)
Calcium: 8.5 mg/dL (ref 8.4–10.5)
Chloride: 113 mmol/L — ABNORMAL HIGH (ref 96–112)
Creatinine, Ser: 1.05 mg/dL (ref 0.50–1.35)
GFR calc non Af Amer: 76 mL/min — ABNORMAL LOW (ref >90)
GFR, EST AFRICAN AMERICAN: 88 mL/min — AB (ref >90)
GLUCOSE: 125 mg/dL — AB (ref 70–99)
POTASSIUM: 3.3 mmol/L — AB (ref 3.5–5.1)
SODIUM: 142 mmol/L (ref 135–145)
TOTAL PROTEIN: 6.4 g/dL (ref 6.0–8.3)

## 2015-03-02 LAB — CBC
HCT: 34.6 % — ABNORMAL LOW (ref 39.0–52.0)
Hemoglobin: 11.7 g/dL — ABNORMAL LOW (ref 13.0–17.0)
MCH: 33.4 pg (ref 26.0–34.0)
MCHC: 33.8 g/dL (ref 30.0–36.0)
MCV: 98.9 fL (ref 78.0–100.0)
Platelets: 48 10*3/uL — ABNORMAL LOW (ref 150–400)
RBC: 3.5 MIL/uL — AB (ref 4.22–5.81)
RDW: 15.1 % (ref 11.5–15.5)
WBC: 3.9 10*3/uL — ABNORMAL LOW (ref 4.0–10.5)

## 2015-03-02 LAB — RAPID URINE DRUG SCREEN, HOSP PERFORMED
Amphetamines: NOT DETECTED
BARBITURATES: NOT DETECTED
BENZODIAZEPINES: NOT DETECTED
Cocaine: NOT DETECTED
Opiates: NOT DETECTED
Tetrahydrocannabinol: NOT DETECTED

## 2015-03-02 LAB — ETHANOL: Alcohol, Ethyl (B): 215 mg/dL — ABNORMAL HIGH (ref 0–9)

## 2015-03-02 LAB — ACETAMINOPHEN LEVEL

## 2015-03-02 LAB — SALICYLATE LEVEL: Salicylate Lvl: <4 mg/dL (ref 2.8–20.0)

## 2015-03-02 MED ORDER — LORAZEPAM 1 MG PO TABS: 0.0000 mg | ORAL_TABLET | Freq: Two times a day (BID) | ORAL | Status: DC

## 2015-03-02 MED ORDER — LORAZEPAM 1 MG PO TABS: 0.0000 mg | ORAL_TABLET | Freq: Four times a day (QID) | ORAL | Status: DC
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
## 2015-03-02 NOTE — Progress Notes (Signed)
Patient meets inpatient criteria per [facility] due to suicidality and intoxication. Contacted Dr. Lajean Saver at [phone] to inform him that patient is actively suicidal. Nurse reports that she called for a sitter.

## 2015-03-02 NOTE — ED Provider Notes (Signed)
CSN: [id number]     Arrival date & time 03/02/15  2136 History   First MD Initiated Contact with Patient 03/02/15 2150     Chief Complaint  Patient presents with  . Suicidal     (Consider location/radiation/quality/duration/timing/severity/associated sxs/prior Treatment) The history is provided by the patient.  Patient with hx depression, etoh abuse, c/o feeling very depressed in the past week. Also states has been drinking heavily in the past few days. Pt states he is having thoughts of suicide, because 'no one cares about me'.  When asked about stressors, or inciting factors, pt just states 'everything'.  Pt denies hx seizures or dts. States does feel shaky when not drinking. Pt denies overdose or any attempt at self harm. Pt denies specific plan initially, then indicates he will jump in front of moving traffic. Denies HI. Pt states recent health otherwise at baseline. No recent trauma or fall. Normal appetite, eating and drinking well. +trouble sleeping at night.      Past Medical History  Diagnosis Date  . Cirrhosis of liver   . Hepatitis C 03-28-12    ? 80's-prev. IV drug abuse  . 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 Blood transfusion 03-28-12    '81-hx. of bleeding ulcer  . History of bleeding ulcers 03-28-12    Hx. '81  . GERD (gastroesophageal reflux disease) 03-26-12    Hx. reflux. ulcers in past, occ. OTC med  . Incisional hernia     has drainage system from abdomin  . Subdural hematoma 12/13/12   Past Surgical History  Procedure Laterality Date  . Umbilical hernia repair    . Hernia repair  08/24/11    ventral hernia with mesh  . Hernia repair  97'98    Umbilical  . Peptic ulcer  03-26-12    open peptic ulcer surgery repair- '81  . Incisional hernia repair  03/26/2012  . Incisional hernia repair  03/30/2012    Procedure: LAPAROSCOPIC INCISIONAL HERNIA;  Surgeon: Harl Bowie, MD;  Location: [facility];  Service: General;  Laterality: N/A;   Laparoscopic Incisional/ventral Hernia Repair with Mesh  . Tonsillectomy    . Appendectomy    . Incisional hernia repair  07/20/2012    mesh  . Incisional hernia repair  07/20/2012    Procedure: HERNIA REPAIR INCISIONAL;  Surgeon: Harl Bowie, MD;  Location: [place];  Service: General;  Laterality: N/A;  repair of incisional hernia with Biologic mesh   Family History  Problem Relation Age of Onset  . Cancer Mother     breast  . Cancer Father     brain  . Heart disease Brother   . Cancer Sister     breast  . Cancer Sister     breast   History  Substance Use Topics  . Smoking status: Current Every Day Smoker -- 0.25 packs/day for 38 years    Types: Cigarettes  . Smokeless tobacco: Never Used  . Alcohol Use: 75.6 oz/week    126 Cans of beer per week    Review of Systems  Constitutional: Negative for fever.  HENT: Negative for sore throat.   Eyes: Negative for pain and visual disturbance.  Respiratory: Negative for cough and shortness of breath.   Cardiovascular: Negative for chest pain.  Gastrointestinal: Negative for vomiting, abdominal pain and diarrhea.  Genitourinary: Negative for flank pain.  Musculoskeletal: Negative for back pain and neck pain.  Skin: Negative for rash.  Neurological: Negative for weakness, numbness  and headaches.  Hematological: Does not bruise/bleed easily.  Psychiatric/Behavioral: Positive for suicidal ideas and dysphoric mood.      Allergies  Review of patient's allergies indicates no known allergies.  Home Medications   Prior to Admission medications   Medication Sig Start Date End Date Taking? Authorizing Provider  OMEPRAZOLE PO Take 1 capsule by mouth daily.    Historical Provider, MD  traZODone (DESYREL) 150 MG tablet Take 150 mg by mouth at bedtime.    Historical Provider, MD  TRAZODONE HCL PO Take 1 tablet by mouth at bedtime.     Historical Provider, MD   BP 131/80 mmHg  Pulse 96  Temp(Src) 97.9 F (36.6 C) (Oral)  Resp 16   SpO2 99% Physical Exam  Constitutional: He is oriented to person, place, and time. He appears well-developed and well-nourished. No distress.  Smells of etoh.   HENT:  Head: Atraumatic.  Mouth/Throat: Oropharynx is clear and moist.  Eyes: Conjunctivae are normal. Pupils are equal, round, and reactive to light. No scleral icterus.  Neck: Neck supple. No tracheal deviation present.  Cardiovascular: Normal rate, regular rhythm, normal heart sounds and intact distal pulses.   Pulmonary/Chest: Effort normal and breath sounds normal. No accessory muscle usage. No respiratory distress.  Abdominal: Soft. Bowel sounds are normal. He exhibits no distension. There is no tenderness.  Musculoskeletal: Normal range of motion. He exhibits no tenderness.  Neurological: He is alert and oriented to person, place, and time.  Mildly shaky. Ambulates w steady gait.   Skin: Skin is warm and dry. He is not diaphoretic.  Psychiatric:  Appears intoxicated. Depressed mood. +SI.  Nursing note and vitals reviewed.   ED Course  Procedures (including critical care time) Labs Review  Results for orders placed or performed during the hospital encounter of 03/02/15  CBC  Result Value Ref Range   WBC 3.9 (L) 4.0 - 10.5 K/uL   RBC 3.50 (L) 4.22 - 5.81 MIL/uL   Hemoglobin 11.7 (L) 13.0 - 17.0 g/dL   HCT 34.6 (L) 39.0 - 52.0 %   MCV 98.9 78.0 - 100.0 fL   MCH 33.4 26.0 - 34.0 pg   MCHC 33.8 30.0 - 36.0 g/dL   RDW 15.1 11.5 - 15.5 %   Platelets PENDING 150 - 400 K/uL  Urine Drug Screen  Result Value Ref Range   Opiates NONE DETECTED NONE DETECTED   Cocaine NONE DETECTED NONE DETECTED   Benzodiazepines NONE DETECTED NONE DETECTED   Amphetamines NONE DETECTED NONE DETECTED   Tetrahydrocannabinol NONE DETECTED NONE DETECTED   Barbiturates NONE DETECTED NONE DETECTED         MDM   Labs.  Psych team consulted.  Reviewed nursing notes and prior charts for additional history.   Psych holding orders  placed/ciwa protocol.  Discussed w psych team - they are working on inpatient psych tx.  Pt subsequently indicates he plan to kill self, and will not go to inpatient psych.  Given suicidal thoughts/plan, ivc papers completed.   [facility] team indicates pt accepted to [facility].  Recheck, pt alert, content, vitals normal, appears stable for transfer.        Lajean Saver, MD 03/02/15 [phone]

## 2015-03-02 NOTE — Discharge Instructions (Signed)
Alcohol Intoxication There is no evidence of stroke. Follow-up with your doctor. Return to the ED with new or worsening symptoms. Alcohol intoxication occurs when the amount of alcohol that a person has consumed impairs his or her ability to mentally and physically function. Alcohol directly impairs the normal chemical activity of the brain. Drinking large amounts of alcohol can lead to changes in mental function and behavior, and it can cause many physical effects that can be harmful.  Alcohol intoxication can range in severity from mild to very severe. Various factors can affect the level of intoxication that occurs, such as the person's age, gender, weight, frequency of alcohol consumption, and the presence of other medical conditions (such as diabetes, seizures, or heart conditions). Dangerous levels of alcohol intoxication may occur when people drink large amounts of alcohol in a short period (binge drinking). Alcohol can also be especially dangerous when combined with certain prescription medicines or "recreational" drugs. SIGNS AND SYMPTOMS Some common signs and symptoms of mild alcohol intoxication include:  Loss of coordination.  Changes in mood and behavior.  Impaired judgment.  Slurred speech. As alcohol intoxication progresses to more severe levels, other signs and symptoms will appear. These may include:  Vomiting.  Confusion and impaired memory.  Slowed breathing.  Seizures.  Loss of consciousness. DIAGNOSIS  Your health care provider will take a medical history and perform a physical exam. You will be asked about the amount and type of alcohol you have consumed. Blood tests will be done to measure the concentration of alcohol in your blood. In many places, your blood alcohol level must be lower than 80 mg/dL (0.08%) to legally drive. However, many dangerous effects of alcohol can occur at much lower levels.  TREATMENT  People with alcohol intoxication often do not require  treatment. Most of the effects of alcohol intoxication are temporary, and they go away as the alcohol naturally leaves the body. Your health care provider will monitor your condition until you are stable enough to go home. Fluids are sometimes given through an IV access tube to help prevent dehydration.  HOME CARE INSTRUCTIONS  Do not drive after drinking alcohol.  Stay hydrated. Drink enough water and fluids to keep your urine clear or pale yellow. Avoid caffeine.   Only take over-the-counter or prescription medicines as directed by your health care provider.  SEEK MEDICAL CARE IF:   You have persistent vomiting.   You do not feel better after a few days.  You have frequent alcohol intoxication. Your health care provider can help determine if you should see a substance use treatment counselor. SEEK IMMEDIATE MEDICAL CARE IF:   You become shaky or tremble when you try to stop drinking.   You shake uncontrollably (seizure).   You throw up (vomit) blood. This may be bright red or may look like black coffee grounds.   You have blood in your stool. This may be bright red or may appear as a black, tarry, bad smelling stool.   You become lightheaded or faint.  MAKE SURE YOU:   Understand these instructions.  Will watch your condition.  Will get help right away if you are not doing well or get worse. Document Released: 09/09/2005 Document Revised: 08/02/2013 Document Reviewed: 05/05/2013 [facility] Patient Information 2015 [place], [place]. This information is not intended to replace advice given to you by your health care provider. Make sure you discuss any questions you have with your health care provider.

## 2015-03-02 NOTE — ED Notes (Signed)
Pt transported by [employer] from home after calling 911 to request ambulance transport him with SI. Pt states he has a number of plans but verbalized he would grab the officers gun and shoot himself. Pt states he drank "a 5th" today, this is his norm. Pt c/o HA.

## 2015-03-02 NOTE — Discharge Instructions (Signed)
Transfer to [facility].

## 2015-03-02 NOTE — ED Notes (Signed)
csw in room talking to  Pt, delay on lab draw

## 2015-03-02 NOTE — ED Notes (Signed)
Pt ambulated in room without assistance with steady gait, happy meal given.

## 2015-03-02 NOTE — Progress Notes (Signed)
LCSW spoke with Arville Go, [facility] for bed availability for this patient. Joann reports that he is accepted to bed 305-2, LCSW contacted Dr. Ashok Cordia to inform him of patient bed number.

## 2015-03-03 ENCOUNTER — Encounter (HOSPITAL_COMMUNITY): Payer: Self-pay

## 2015-03-03 ENCOUNTER — Inpatient Hospital Stay (HOSPITAL_COMMUNITY)
Admission: AD | Admit: 2015-03-03 | Discharge: 2015-03-07 | DRG: 897 | Disposition: A | Discharge status: Home / Self Care | Payer: Medicaid Other | Source: Intra-hospital | Attending: Psychiatry | Admitting: Psychiatry

## 2015-03-03 DIAGNOSIS — Z8619 Personal history of other infectious and parasitic diseases: Secondary | ICD-10-CM | POA: Diagnosis not present

## 2015-03-03 DIAGNOSIS — F1010 Alcohol abuse, uncomplicated: Secondary | ICD-10-CM | POA: Diagnosis not present

## 2015-03-03 DIAGNOSIS — Z8679 Personal history of other diseases of the circulatory system: Secondary | ICD-10-CM | POA: Diagnosis not present

## 2015-03-03 DIAGNOSIS — R42 Dizziness and giddiness: Secondary | ICD-10-CM | POA: Diagnosis present

## 2015-03-03 DIAGNOSIS — M6281 Muscle weakness (generalized): Secondary | ICD-10-CM | POA: Diagnosis not present

## 2015-03-03 DIAGNOSIS — F17210 Nicotine dependence, cigarettes, uncomplicated: Secondary | ICD-10-CM | POA: Diagnosis present

## 2015-03-03 DIAGNOSIS — Z590 Homelessness: Secondary | ICD-10-CM

## 2015-03-03 DIAGNOSIS — G8929 Other chronic pain: Secondary | ICD-10-CM | POA: Diagnosis not present

## 2015-03-03 DIAGNOSIS — Z720 Tobacco use: Secondary | ICD-10-CM | POA: Diagnosis not present

## 2015-03-03 DIAGNOSIS — F1024 Alcohol dependence with alcohol-induced mood disorder: Principal | ICD-10-CM | POA: Diagnosis present

## 2015-03-03 DIAGNOSIS — K219 Gastro-esophageal reflux disease without esophagitis: Secondary | ICD-10-CM | POA: Diagnosis not present

## 2015-03-03 DIAGNOSIS — R45851 Suicidal ideations: Secondary | ICD-10-CM | POA: Diagnosis present

## 2015-03-03 DIAGNOSIS — F332 Major depressive disorder, recurrent severe without psychotic features: Secondary | ICD-10-CM | POA: Diagnosis present

## 2015-03-03 DIAGNOSIS — Y907 Blood alcohol level of 200-239 mg/100 ml: Secondary | ICD-10-CM | POA: Diagnosis present

## 2015-03-03 DIAGNOSIS — F329 Major depressive disorder, single episode, unspecified: Secondary | ICD-10-CM | POA: Diagnosis present

## 2015-03-03 MED ORDER — ALUM & MAG HYDROXIDE-SIMETH 200-200-20 MG/5ML PO SUSP
30.0000 mL | ORAL | Status: DC | PRN
  Administered 2015-03-03: 30 mL via ORAL
  Filled 2015-03-03: qty 30

## 2015-03-03 MED ORDER — PANTOPRAZOLE SODIUM 40 MG PO TBEC
40.0000 mg | DELAYED_RELEASE_TABLET | Freq: Every day | ORAL | Status: DC
  Administered 2015-03-03 – 2015-03-07 (×5): 40 mg via ORAL
  Filled 2015-03-03 (×7): qty 1

## 2015-03-03 MED ORDER — ONDANSETRON 4 MG PO TBDP: 4.0000 mg | ORAL_TABLET | Freq: Four times a day (QID) | ORAL | Status: AC | PRN

## 2015-03-03 MED ORDER — LORAZEPAM 1 MG PO TABS
1.0000 mg | ORAL_TABLET | Freq: Four times a day (QID) | ORAL | Status: AC | PRN
  Administered 2015-03-03 – 2015-03-04 (×2): 1 mg via ORAL
  Filled 2015-03-03 (×2): qty 1

## 2015-03-03 MED ORDER — LORAZEPAM 1 MG PO TABS
1.0000 mg | ORAL_TABLET | Freq: Three times a day (TID) | ORAL | Status: AC
  Administered 2015-03-04 (×3): 1 mg via ORAL
  Filled 2015-03-03 (×3): qty 1

## 2015-03-03 MED ORDER — LORAZEPAM 1 MG PO TABS
1.0000 mg | ORAL_TABLET | Freq: Four times a day (QID) | ORAL | Status: AC
  Administered 2015-03-03 (×4): 1 mg via ORAL
  Filled 2015-03-03 (×3): qty 1

## 2015-03-03 MED ORDER — LORAZEPAM 1 MG PO TABS
1.0000 mg | ORAL_TABLET | Freq: Two times a day (BID) | ORAL | Status: AC
  Administered 2015-03-05 (×2): 1 mg via ORAL
  Filled 2015-03-03 (×2): qty 1

## 2015-03-03 MED ORDER — HYDROXYZINE HCL 25 MG PO TABS
25.0000 mg | ORAL_TABLET | Freq: Four times a day (QID) | ORAL | Status: AC | PRN
  Administered 2015-03-04 – 2015-03-05 (×2): 25 mg via ORAL
  Filled 2015-03-03 (×3): qty 1

## 2015-03-03 MED ORDER — LORAZEPAM 1 MG PO TABS
1.0000 mg | ORAL_TABLET | Freq: Every day | ORAL | Status: AC
  Administered 2015-03-06: 1 mg via ORAL
  Filled 2015-03-03: qty 1

## 2015-03-03 MED ORDER — VITAMIN B-1 100 MG PO TABS
100.0000 mg | ORAL_TABLET | Freq: Every day | ORAL | Status: DC
  Administered 2015-03-04 – 2015-03-07 (×4): 100 mg via ORAL
  Filled 2015-03-03 (×6): qty 1

## 2015-03-03 MED ORDER — LOPERAMIDE HCL 2 MG PO CAPS: 2.0000 mg | ORAL_CAPSULE | ORAL | Status: AC | PRN

## 2015-03-03 MED ORDER — ADULT MULTIVITAMIN W/MINERALS CH
1.0000 | ORAL_TABLET | Freq: Every day | ORAL | Status: DC
  Administered 2015-03-03 – 2015-03-07 (×5): 1 via ORAL
  Filled 2015-03-03 (×7): qty 1

## 2015-03-03 MED ORDER — MAGNESIUM HYDROXIDE 400 MG/5ML PO SUSP: 30.0000 mL | Freq: Every day | ORAL | Status: DC | PRN

## 2015-03-03 MED ORDER — TRAZODONE HCL 50 MG PO TABS
50.0000 mg | ORAL_TABLET | Freq: Every evening | ORAL | Status: DC | PRN
  Administered 2015-03-04 – 2015-03-05 (×2): 50 mg via ORAL
  Filled 2015-03-03: qty 1
  Filled 2015-03-03: qty 3
  Filled 2015-03-03: qty 1

## 2015-03-03 MED ORDER — THIAMINE HCL 100 MG/ML IJ SOLN
100.0000 mg | Freq: Once | INTRAMUSCULAR | Status: AC
  Administered 2015-03-03: 100 mg via INTRAMUSCULAR
  Filled 2015-03-03: qty 2

## 2015-03-03 MED ORDER — ACETAMINOPHEN 325 MG PO TABS: 650.0000 mg | ORAL_TABLET | Freq: Four times a day (QID) | ORAL | Status: DC | PRN
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
## 2015-03-03 NOTE — H&P (Signed)
Psychiatric Admission Assessment Adult  Patient Identification: Noah Medina MRN:  [id number] Date of Evaluation:  03/03/2015 Chief Complaint:  MDD ETOH USE DISORDER Principal Diagnosis: Alcohol abuse Diagnosis:   Patient Active Problem List   Diagnosis Date Noted  . Homicidal ideation [R45.850] 09/16/2014  . 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 Partial small bowel obstruction [K56.69] 01/31/2014  . Pancytopenia [D61.818] 01/31/2014  . 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 Hepatic encephalopathy [K72.90] 11/18/2013  . Alcohol intoxication [F10.129] 11/18/2013  . Anasarca [R60.1] 11/18/2013  . Tobacco use disorder [Z72.0] 11/18/2013  . Ventral hernia [K43.9] 11/18/2013  . Abdominal  pain, other specified site [R10.84] 08/19/2013  . Syncope [R55] 08/16/2013  . Alcohol abuse [F10.10] 08/16/2013  . GERD (gastroesophageal reflux disease) [K21.9] 08/16/2013  . Nausea and vomiting [R11.2] 08/16/2013  . Transaminitis [R74.0] 08/16/2013  . Subdural hematoma [I62.00] 04/24/2013  . Fall [W19.XXXA] 04/24/2013    Class: Acute  . Thrombocytopenia, unspecified [D69.6] 04/24/2013  . Infected seroma, postoperative [T81.4XXA, T79.2XXA] 07/13/2012  . MRSA (methicillin resistant staph aureus) culture positive [Z22.322] 07/04/2012  . Ascites [R18.8] 07/03/2012  . Normocytic anemia [D64.9] 07/03/2012  . Hyponatremia [E87.1] 07/03/2012  . Hypokalemia [E87.6] 07/02/2012  . Severe malnutrition [E41] 07/02/2012  . Status post repair of ventral hernia [Z98.89] 09/07/2011  . Seroma, postoperative, possible abscess [T88.8XXA, T79.2XXA] 07/20/2011  . 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 Cirrhosis [K74.60] 06/29/2011   History of Present Illness: Per Noah Medina is an 59 y.o. male who was transported to the ED voluntarily by [employer]. Patient reports that he drank a fifth of '[place]" and reports that he wants to kill himself  because no one loves him. 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 Patient reports that he is homeless and currently lives in a hotel but plans to kill himself in the woods. Patient reports that he drinks alcohol everyday and drinks about a fifth per day. Patient reports that he does not have any support in his family and stated "no one cares about me." Patient repeated several times that he was going to kills himself and that no one would stop him. Patient reports that he was "kicked out" of [facility] and is not currently receiving any other services. Patient denies HI and psychosis.   Today, patient was seen.  "I've been seen at [facility] but not in a hospital.  It is drinking that will kill me."  He states he has been an alcoholic for 30 years.  He is on disability.  He denies having depression.  He was treated at [facility] in [place] and afterwards at [facility].    Elements:  Location:  detox. Quality:  feeling hopeless.  . Severity:  severe. Timing:  a long time. Duration:  chronic. Context:  see HPI. Associated Signs/Symptoms: Depression Symptoms:  depressed mood, insomnia, difficulty concentrating, hopelessness, panic attacks, (Hypo) Manic Symptoms:  Labiality of Mood, Anxiety Symptoms:  Excessive Worry, Psychotic Symptoms:  NA PTSD Symptoms: Served in the [place] [employer] Total Time spent with patient: 30 minutes  Past Medical History:  Past Medical History  Diagnosis Date  . Cirrhosis of liver   . Hepatitis C 03-28-12    ? 80's-prev. IV drug abuse  . 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 Blood transfusion 03-28-12    '81-hx. of bleeding ulcer  .  History of bleeding ulcers 03-28-12    Hx. '81  . GERD (gastroesophageal reflux disease) 03-26-12    Hx. reflux. ulcers in past, occ. OTC med  . Incisional hernia     has drainage system from abdomin  . Subdural hematoma  12/13/12    Past Surgical History  Procedure Laterality Date  . Umbilical hernia repair    . Hernia repair  08/24/11    ventral hernia with mesh  . Hernia repair  46'50    Umbilical  . Peptic ulcer  03-26-12    open peptic ulcer surgery repair- '81  . Incisional hernia repair  03/26/2012  . Incisional hernia repair  03/30/2012    Procedure: LAPAROSCOPIC INCISIONAL HERNIA;  Surgeon: Harl Bowie, MD;  Location: [facility];  Service: General;  Laterality: N/A;  Laparoscopic Incisional/ventral Hernia Repair with Mesh  . Tonsillectomy    . Appendectomy    . Incisional hernia repair  07/20/2012    mesh  . Incisional hernia repair  07/20/2012    Procedure: HERNIA REPAIR INCISIONAL;  Surgeon: Harl Bowie, MD;  Location: [facility];  Service: General;  Laterality: N/A;  repair of incisional hernia with Biologic mesh   Family History:  Family History  Problem Relation Age of Onset  . Cancer Mother     breast  . Cancer Father     brain  . Heart disease Brother   . Cancer Sister     breast  . Cancer Sister     breast   Social History:  History  Alcohol Use  . 75.6 oz/week  . 126 Cans of beer per week     History  Drug Use  . Yes  . Special: Cocaine, Marijuana, Methamphetamines, IV    Comment: "somestimes"    History   Social History  . Marital Status: Single    Spouse Name: N/A  . Number of Children: 1  . Years of Education: N/A   Occupational History  . Disabled, former Building control surveyor    Social History Main Topics  . Smoking status: Current Every Day Smoker -- 0.25 packs/day for 38 years    Types: Cigarettes  . Smokeless tobacco: Never Used  . Alcohol Use: 75.6 oz/week    126 Cans of beer per week  . Drug Use: Yes    Special: Cocaine, Marijuana, Methamphetamines, IV     Comment: "somestimes"  . Sexual Activity: No   Other Topics Concern  . None   Social History Narrative   Lives alone.  Disabled.  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  1 son.  Independent of ADLs and  ambulation.     Additional Social History:    Musculoskeletal: Strength & Muscle Tone: within normal limits Gait & Station: normal Patient leans: N/A  Psychiatric Specialty Exam: Physical Exam  Vitals reviewed. Psychiatric: He has a normal mood and affect.    Review of Systems  Psychiatric/Behavioral: Positive for depression. The patient is nervous/anxious.   All other systems reviewed and are negative.   Blood pressure 150/64, pulse 93, temperature 97.8 F (36.6 C), temperature source Oral, resp. rate 20, height 5\' 10"  (1.778 m), weight 98.884 kg (218 lb).Body mass index is 31.28 kg/(m^2).  General Appearance: Fairly Groomed  Engineer, water::  Good  Speech:  Normal Rate  Volume:  Normal  Mood:  Anxious  Affect:  Appropriate  Thought Process:  Intact  Orientation:  Full (Time, Place, and Person)  Thought Content:  Rumination  Suicidal Thoughts:  No  Homicidal Thoughts:  No  Memory:  Immediate;   Good Recent;   Good Remote;   Good  Judgement:  Good  Insight:  Fair  Psychomotor Activity:  Tremor  Concentration:  Good  Recall:  [place] of Knowledge:Good  Language: Good  Akathisia:  Negative  Handed:  Right  AIMS (if indicated):     Assets:  Communication Skills Desire for Improvement Resilience Social Support  ADL's:  Intact  Cognition: WNL  Sleep:      Risk to Self: Is patient at risk for suicide?: Yes Risk to Others:   Prior Inpatient Therapy:   Prior Outpatient Therapy:    Alcohol Screening: 1. How often do you have a drink containing alcohol?: 4 or more times a week 2. How many drinks containing alcohol do you have on a typical day when you are drinking?: 10 or more 3. How often do you have six or more drinks on one occasion?: Weekly Preliminary Score: 7 4. How often during the last year have you found that you were not able to stop drinking once you had started?: Weekly 5. How often during the last year have you failed to do what was normally expected  from you becasue of drinking?: Weekly 6. How often during the last year have you needed a first drink in the morning to get yourself going after a heavy drinking session?: Weekly 7. How often during the last year have you had a feeling of guilt of remorse after drinking?: Weekly 8. How often during the last year have you been unable to remember what happened the night before because you had been drinking?: Weekly 9. Have you or someone else been injured as a result of your drinking?: Yes, during the last year 10. Has a relative or friend or a doctor or another health worker been concerned about your drinking or suggested you cut down?: Yes, during the last year Alcohol Use Disorder Identification Test Final Score (AUDIT): 34 Brief Intervention: Patient declined brief intervention  Allergies:  No Known Allergies Lab Results:  Results for orders placed or performed during the hospital encounter of 03/02/15 (from the past 48 hour(s))  Urine Drug Screen     Status: None   Collection Time: 03/02/15 10:14 PM  Result Value Ref Range   Opiates NONE DETECTED NONE DETECTED   Cocaine NONE DETECTED NONE DETECTED   Benzodiazepines NONE DETECTED NONE DETECTED   Amphetamines NONE DETECTED NONE DETECTED   Tetrahydrocannabinol NONE DETECTED NONE DETECTED   Barbiturates NONE DETECTED NONE DETECTED    Comment:        DRUG [place].  IF CONFIRMATION IS NEEDED FOR ANY PURPOSE, NOTIFY LAB WITHIN 5 DAYS.        LOWEST DETECTABLE LIMITS FOR URINE DRUG SCREEN Drug Class       Cutoff (ng/mL) Amphetamine      1000 Barbiturate      200 Benzodiazepine   962 Tricyclics       229 Opiates          300 Cocaine          300 THC              50   Acetaminophen level     Status: Abnormal   Collection Time: 03/02/15 10:34 PM  Result Value Ref Range   Acetaminophen (Tylenol), Serum <10.0 (L) 10 - 30 ug/mL    Comment:        THERAPEUTIC CONCENTRATIONS VARY SIGNIFICANTLY.  A RANGE OF  10-30 ug/mL MAY BE AN EFFECTIVE CONCENTRATION FOR MANY PATIENTS. HOWEVER, SOME ARE BEST TREATED AT CONCENTRATIONS OUTSIDE THIS RANGE. ACETAMINOPHEN CONCENTRATIONS >150 ug/mL AT 4 HOURS AFTER INGESTION AND >50 ug/mL AT 12 HOURS AFTER INGESTION ARE OFTEN ASSOCIATED WITH TOXIC REACTIONS.   CBC     Status: Abnormal   Collection Time: 03/02/15 10:34 PM  Result Value Ref Range   WBC 3.9 (L) 4.0 - 10.5 K/uL   RBC 3.50 (L) 4.22 - 5.81 MIL/uL   Hemoglobin 11.7 (L) 13.0 - 17.0 g/dL   HCT 34.6 (L) 39.0 - 52.0 %   MCV 98.9 78.0 - 100.0 fL   MCH 33.4 26.0 - 34.0 pg   MCHC 33.8 30.0 - 36.0 g/dL   RDW 15.1 11.5 - 15.5 %   Platelets 48 (L) 150 - 400 K/uL    Comment: REPEATED TO VERIFY SPECIMEN CHECKED FOR CLOTS PLATELET COUNT CONFIRMED BY SMEAR   Comprehensive metabolic panel     Status: Abnormal   Collection Time: 03/02/15 10:34 PM  Result Value Ref Range   Sodium 142 135 - 145 mmol/L   Potassium 3.3 (L) 3.5 - 5.1 mmol/L    Comment: DELTA CHECK NOTED REPEATED TO VERIFY    Chloride 113 (H) 96 - 112 mmol/L   CO2 23 19 - 32 mmol/L   Glucose, Bld 125 (H) 70 - 99 mg/dL   BUN 10 6 - 23 mg/dL   Creatinine, Ser 1.05 0.50 - 1.35 mg/dL   Calcium 8.5 8.4 - 10.5 mg/dL   Total Protein 6.4 6.0 - 8.3 g/dL   Albumin 3.1 (L) 3.5 - 5.2 g/dL   AST 58 (H) 0 - 37 U/L   ALT 34 0 - 53 U/L   Alkaline Phosphatase 82 39 - 117 U/L   Total Bilirubin 1.6 (H) 0.3 - 1.2 mg/dL   GFR calc non Af Amer 76 (L) >90 mL/min   GFR calc Af Amer 88 (L) >90 mL/min    Comment: (NOTE) The eGFR has been calculated using the CKD EPI equation. This calculation has not been validated in all clinical situations. eGFR's persistently <90 mL/min signify possible Chronic Kidney Disease.    Anion gap 6 5 - 15  Ethanol (ETOH)     Status: Abnormal   Collection Time: 03/02/15 10:34 PM  Result Value Ref Range   Alcohol, Ethyl (B) 215 (H) 0 - 9 mg/dL    Comment:        LOWEST DETECTABLE LIMIT FOR SERUM ALCOHOL IS 11 mg/dL FOR  MEDICAL PURPOSES ONLY   Salicylate level     Status: None   Collection Time: 03/02/15 10:34 PM  Result Value Ref Range   Salicylate Lvl <7.5 2.8 - 20.0 mg/dL   Current Medications: Current Facility-Administered Medications  Medication Dose Route Frequency Provider Last Rate Last Dose  . acetaminophen (TYLENOL) tablet 650 mg  650 mg Oral Q6H PRN Harriet Butte, NP      . alum & mag hydroxide-simeth (MAALOX/MYLANTA) 200-200-20 MG/5ML suspension 30 mL  30 mL Oral Q4H PRN Harriet Butte, NP   30 mL at 03/03/15 0911  . hydrOXYzine (ATARAX/VISTARIL) tablet 25 mg  25 mg Oral Q6H PRN Kerrie Buffalo, NP      . loperamide (IMODIUM) capsule 2-4 mg  2-4 mg Oral PRN Kerrie Buffalo, NP      . LORazepam (ATIVAN) tablet 1 mg  1 mg Oral Q6H PRN Kerrie Buffalo, NP   1 mg at 03/03/15 8832  .  LORazepam (ATIVAN) tablet 1 mg  1 mg Oral QID Kerrie Buffalo, NP       Followed by  . [START ON 03/04/2015] LORazepam (ATIVAN) tablet 1 mg  1 mg Oral TID Kerrie Buffalo, NP       Followed by  . [START ON 03/05/2015] LORazepam (ATIVAN) tablet 1 mg  1 mg Oral BID Kerrie Buffalo, NP       Followed by  . [START ON 03/06/2015] LORazepam (ATIVAN) tablet 1 mg  1 mg Oral Daily Kerrie Buffalo, NP      . magnesium hydroxide (MILK OF MAGNESIA) suspension 30 mL  30 mL Oral Daily PRN Harriet Butte, NP      . multivitamin with minerals tablet 1 tablet  1 tablet Oral Daily Kerrie Buffalo, NP      . ondansetron (ZOFRAN-ODT) disintegrating tablet 4 mg  4 mg Oral Q6H PRN Kerrie Buffalo, NP      . pantoprazole (PROTONIX) EC tablet 40 mg  40 mg Oral Daily Kerrie Buffalo, NP      . thiamine (B-1) injection 100 mg  100 mg Intramuscular Once Kerrie Buffalo, NP      . Derrill Memo ON 03/04/2015] thiamine (VITAMIN B-1) tablet 100 mg  100 mg Oral Daily Kerrie Buffalo, NP      . traZODone (DESYREL) tablet 50 mg  50 mg Oral QHS PRN Harriet Butte, NP       PTA Medications: Prescriptions prior to admission  Medication Sig Dispense Refill Last Dose   . Multiple Vitamin (MULTIVITAMIN WITH MINERALS) TABS tablet Take 1 tablet by mouth daily.   Past Week at Unknown time  . omeprazole (PRILOSEC OTC) 20 MG tablet Take 20 mg by mouth daily.   03/03/2015 at Unknown time  . traZODone (DESYREL) 150 MG tablet Take 150 mg by mouth at bedtime.   Past Week at Unknown time    Previous Psychotropic Medications: No   Substance Abuse History in the last 12 months:  Yes.      Consequences of Substance Abuse: Medical Consequences:  hospital admission  Results for orders placed or performed during the hospital encounter of 03/02/15 (from the past 72 hour(s))  Urine Drug Screen     Status: None   Collection Time: 03/02/15 10:14 PM  Result Value Ref Range   Opiates NONE DETECTED NONE DETECTED   Cocaine NONE DETECTED NONE DETECTED   Benzodiazepines NONE DETECTED NONE DETECTED   Amphetamines NONE DETECTED NONE DETECTED   Tetrahydrocannabinol NONE DETECTED NONE DETECTED   Barbiturates NONE DETECTED NONE DETECTED    Comment:        DRUG SCREEN FOR MEDICAL PURPOSES ONLY.  IF CONFIRMATION IS NEEDED FOR ANY PURPOSE, NOTIFY LAB WITHIN 5 DAYS.        LOWEST DETECTABLE LIMITS FOR URINE DRUG SCREEN Drug Class       Cutoff (ng/mL) Amphetamine      1000 Barbiturate      200 Benzodiazepine   865 Tricyclics       784 Opiates          300 Cocaine          300 THC              50   Acetaminophen level     Status: Abnormal   Collection Time: 03/02/15 10:34 PM  Result Value Ref Range   Acetaminophen (Tylenol), Serum <10.0 (L) 10 - 30 ug/mL    Comment:        THERAPEUTIC CONCENTRATIONS  VARY SIGNIFICANTLY. A RANGE OF 10-30 ug/mL MAY BE AN EFFECTIVE CONCENTRATION FOR MANY PATIENTS. HOWEVER, SOME ARE BEST TREATED AT CONCENTRATIONS OUTSIDE THIS RANGE. ACETAMINOPHEN CONCENTRATIONS >150 ug/mL AT 4 HOURS AFTER INGESTION AND >50 ug/mL AT 12 HOURS AFTER INGESTION ARE OFTEN ASSOCIATED WITH TOXIC REACTIONS.   CBC     Status: Abnormal   Collection Time:  03/02/15 10:34 PM  Result Value Ref Range   WBC 3.9 (L) 4.0 - 10.5 K/uL   RBC 3.50 (L) 4.22 - 5.81 MIL/uL   Hemoglobin 11.7 (L) 13.0 - 17.0 g/dL   HCT 34.6 (L) 39.0 - 52.0 %   MCV 98.9 78.0 - 100.0 fL   MCH 33.4 26.0 - 34.0 pg   MCHC 33.8 30.0 - 36.0 g/dL   RDW 15.1 11.5 - 15.5 %   Platelets 48 (L) 150 - 400 K/uL    Comment: REPEATED TO VERIFY SPECIMEN CHECKED FOR CLOTS PLATELET COUNT CONFIRMED BY SMEAR   Comprehensive metabolic panel     Status: Abnormal   Collection Time: 03/02/15 10:34 PM  Result Value Ref Range   Sodium 142 135 - 145 mmol/L   Potassium 3.3 (L) 3.5 - 5.1 mmol/L    Comment: DELTA CHECK NOTED REPEATED TO VERIFY    Chloride 113 (H) 96 - 112 mmol/L   CO2 23 19 - 32 mmol/L   Glucose, Bld 125 (H) 70 - 99 mg/dL   BUN 10 6 - 23 mg/dL   Creatinine, Ser 1.05 0.50 - 1.35 mg/dL   Calcium 8.5 8.4 - 10.5 mg/dL   Total Protein 6.4 6.0 - 8.3 g/dL   Albumin 3.1 (L) 3.5 - 5.2 g/dL   AST 58 (H) 0 - 37 U/L   ALT 34 0 - 53 U/L   Alkaline Phosphatase 82 39 - 117 U/L   Total Bilirubin 1.6 (H) 0.3 - 1.2 mg/dL   GFR calc non Af Amer 76 (L) >90 mL/min   GFR calc Af Amer 88 (L) >90 mL/min    Comment: (NOTE) The eGFR has been calculated using the CKD EPI equation. This calculation has not been validated in all clinical situations. eGFR's persistently <90 mL/min signify possible Chronic Kidney Disease.    Anion gap 6 5 - 15  Ethanol (ETOH)     Status: Abnormal   Collection Time: 03/02/15 10:34 PM  Result Value Ref Range   Alcohol, Ethyl (B) 215 (H) 0 - 9 mg/dL    Comment:        LOWEST DETECTABLE LIMIT FOR SERUM ALCOHOL IS 11 mg/dL FOR MEDICAL PURPOSES ONLY   Salicylate level     Status: None   Collection Time: 03/02/15 10:34 PM  Result Value Ref Range   Salicylate Lvl <8.5 2.8 - 20.0 mg/dL    Observation Level/Precautions:  15 minute checks  Laboratory:  per ED  Psychotherapy:  group  Medications:  As per medlist  Consultations:  As needed  Discharge Concerns:   safety  Estimated LOS:  2-5 days  Other:     Psychological Evaluations: Yes   Treatment Plan/Recommendations:  Admit for crisis management and mood stabilization. Medication management to re-stabilize current mood symptoms - CIWA protocol, Ativan detox  Group counseling sessions for coping skills Medical consults as needed Review and reinstate any pertinent home medications for other health problems   Medical Decision Making:  Review of Psycho-Social Stressors (1), Discuss test with performing physician (1), Review and summation of old records (2), Established Problem, Worsening (2), Review of Last Therapy Session (  1), Review of Medication Regimen & Side Effects (2) and Review of New Medication or Change in Dosage (2)  I certify that inpatient services furnished can reasonably be expected to improve the patient's condition.   Kerrie Buffalo MAY, AGNP-BC 3/20/20169:44 AM

## 2015-03-03 NOTE — [facility] Suicide Risk Assessment (Signed)
[facility] INPATIENT:  Family/Significant Other Suicide Prevention Education  Suicide Prevention Education:  Patient Refusal for Family/Significant Other Suicide Prevention Education: The patient Noah Medina has refused to provide written consent for family/significant other to be provided Family/Significant Other Suicide Prevention Education during admission and/or prior to discharge.  Physician notified.  SPE was done with pt and he was provided with a brochure.  Lysle Dingwall 03/03/2015, 12:52 PM

## 2015-03-03 NOTE — ED Notes (Signed)
1 Green Coat 1 24 hour recovery token on a string 1 plaid button down shirt 1 yellow t shirt 1 pair socks 1 pair blue jeans w/ brown belt 1 wallet w/    1 $20     1 $10     1 $ 5     5 $1      Total $40     MVP card     EBT card     ID card  1 Motel key card 1 White poker chip

## 2015-03-03 NOTE — Tx Team (Addendum)
Initial Interdisciplinary Treatment Plan   PATIENT STRESSORS: Health problems Medication change or noncompliance Substance abuse Traumatic event   PATIENT STRENGTHS: General fund of knowledge Motivation for treatment/growth   PROBLEM LIST: Problem List/Patient Goals Date to be addressed Date deferred Reason deferred Estimated date of resolution  Risk for suicide 03/03/15     Hallucinations ([facility]) 03/03/15     Homeless 03/03/15     "nothing now" 03/03/15                                    DISCHARGE CRITERIA:  Ability to meet basic life and health needs Adequate post-discharge living arrangements Improved stabilization in mood, thinking, and/or behavior Medical problems require only outpatient monitoring Motivation to continue treatment in a less acute level of care  PRELIMINARY DISCHARGE PLAN: Attend aftercare/continuing care group Outpatient therapy  PATIENT/FAMIILY INVOLVEMENT: This treatment plan has been presented to and reviewed with the patient, Noah Medina.  The patient and family have been given the opportunity to ask questions and make suggestions.  Vonzella Nipple A 03/03/2015, 5:43 AM

## 2015-03-03 NOTE — BHH Suicide Risk Assessment (Signed)
[facility] Admission Suicide Risk Assessment   Nursing information obtained from:    Demographic factors:   male, homeless Current Mental Status:  Alcohol withdrawal Loss Factors:   kicked out Rehab program and relapsed, no social support Historical Factors:   denies hx of previous SA, denies access to firearms, denies family hx of suicide and mental illness Risk Reduction Factors:   does not want to die b/c he enjoys life Total Time spent with patient: 45 minutes Principal Problem: Alcohol abuse Diagnosis:   Patient Active Problem List   Diagnosis Date Noted  . Homicidal ideation [R45.850] 09/16/2014  . 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 Partial small bowel obstruction [K56.69] 01/31/2014  . Pancytopenia [D61.818] 01/31/2014  . 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 Hepatic encephalopathy [K72.90] 11/18/2013  . Alcohol intoxication [F10.129] 11/18/2013  . Anasarca [R60.1] 11/18/2013  . Tobacco use disorder [Z72.0] 11/18/2013  . Ventral hernia [K43.9] 11/18/2013  . Abdominal  pain, other specified site [R10.84] 08/19/2013  . Syncope [R55] 08/16/2013  . Alcohol abuse [F10.10] 08/16/2013  . GERD (gastroesophageal reflux disease) [K21.9] 08/16/2013  . Nausea and vomiting [R11.2] 08/16/2013  . Transaminitis [R74.0] 08/16/2013  . Subdural hematoma [I62.00] 04/24/2013  . Fall [W19.XXXA] 04/24/2013    Class: Acute  . Thrombocytopenia, unspecified [D69.6] 04/24/2013  . Infected seroma, postoperative [T81.4XXA, T79.2XXA] 07/13/2012  . MRSA (methicillin resistant staph aureus) culture positive [Z22.322] 07/04/2012  . Ascites [R18.8] 07/03/2012  . Normocytic anemia [D64.9] 07/03/2012  . Hyponatremia [E87.1] 07/03/2012  . Hypokalemia [E87.6] 07/02/2012  . Severe malnutrition [E41] 07/02/2012  . Status post repair of ventral hernia [Z98.89] 09/07/2011  . Seroma, postoperative, possible abscess [T88.8XXA, T79.2XXA] 07/20/2011  . Cirrhosis of liver with history  of hepatitis C [K74.60] 07/20/2011  . Cirrhosis [K74.60] 06/29/2011     Continued Clinical Symptoms:  Alcohol Use Disorder Identification Test Final Score (AUDIT): 34 The "Alcohol Use Disorders Identification Test", Guidelines for Use in Primary Care, Second Edition.  World Pharmacologist [facility]). Score between 0-7:  no or low risk or alcohol related problems. Score between 8-15:  moderate risk of alcohol related problems. Score between 16-19:  high risk of alcohol related problems. Score 20 or above:  warrants further diagnostic evaluation for alcohol dependence and treatment.   CLINICAL FACTORS:   Alcohol/Substance Abuse/Dependencies Medical Diagnoses and Treatments/Surgeries   Musculoskeletal: Strength & Muscle Tone: within normal limits Gait & Station: normal Patient leans: N/A  Psychiatric Specialty Exam: Physical Exam  Review of Systems  Cardiovascular: Positive for leg swelling.  Gastrointestinal: Positive for heartburn and abdominal pain. Negative for nausea, vomiting, diarrhea and constipation.  Musculoskeletal: Negative for back pain, joint pain and neck pain.  Neurological: Positive for tremors.  Psychiatric/Behavioral: Positive for substance abuse. Negative for depression, suicidal ideas and hallucinations. The patient is not nervous/anxious and does not have insomnia.     Blood pressure 113/72, pulse 130, temperature 97.8 F (36.6 C), temperature source Oral, resp. rate 20, height 5\' 10"  (1.778 m), weight 98.884 kg (218 lb).Body mass index is 31.28 kg/(m^2).  General Appearance: Casual  Eye Contact::  Fair  Speech:  Clear and Coherent and Normal Rate  Volume:  Normal  Mood:  Euthymic  Affect:  Appropriate  Thought Process:  Goal Directed  Orientation:  Full (Time, Place, and Person)  Thought Content:  Negative  Suicidal Thoughts:  No  Homicidal Thoughts:  No  Memory:  Immediate;   Fair Recent;   Fair Remote;   Fair  Judgement:  Fair  Insight:  Present   Psychomotor Activity:  Tremor  Concentration:  Fair  Recall:  [employer] of Knowledge:Fair  Language: Fair  Akathisia:  No  Handed:  Right  AIMS (if indicated):     Assets:  Communication Skills Desire for Improvement  Sleep:     Cognition: WNL  ADL's:  Intact     COGNITIVE FEATURES THAT CONTRIBUTE TO RISK:  None    SUICIDE RISK:    Moderate:  Frequent suicidal ideation with limited intensity, and duration, some specificity in terms of plans, no associated intent, good self-control, limited dysphoria/symptomatology, some risk factors present, and identifiable protective factors, including available and accessible social support.  Assessment: Pt brought to ED by police where he told staff he was having SI. Today pt denies depression, SI, HI, AVH. He states he is going thru active withdrawal at this time. Reports he only feels depressed when he is drinking. Pt has agreed to sign in on a voluntary basis.   PLAN OF CARE: 1. Admit for crisis management and stabilization. 2. Medication management to reduce current symptoms to base line and improve thepatient's overall level of functioning 3. Treat health problems as indicated. 4. Develop treatment plan to decrease risk of relapse upon discharge and the need for readmission. 5. Psycho-social education regarding relapse prevention and self care. 6. Health care follow up as needed for medical problems. 7. Restart home medications where appropriate 8. Review labs- K 3.3, AST 58, glu 125, platelets 48, Hb 11.7 9. Start CIWA and place on Ativan protocol  Medical Decision Making:  Review of Psycho-Social Stressors (1), Review or order clinical lab tests (1), Established Problem, Worsening (2), Review of Medication Regimen & Side Effects (2) and Review of New Medication or Change in Dosage (2)  I certify that inpatient services furnished can reasonably be expected to improve the patient's condition.   Charlcie Cradle 03/03/2015, 8:59  AM

## 2015-03-03 NOTE — Progress Notes (Signed)
Patient did attend the evening speaker [place] meeting. Pt was in attendance but slept through the entire meeting.

## 2015-03-03 NOTE — Progress Notes (Signed)
Admission Note:   D:58 yr male who presents IVC in no acute distress for the treatment of SI/AVH and Depression. Pt denies HI.  Pt appears flat and depressed. Pt was calm and cooperative with admission process. Pt presents with SI (" If I find a way to do it, I'm do it" but can't contract for safety. Pt was placed in the padded room to be monitored continuously and decrease the chances of pt harming self. Pt was kicked out of [facility] for breech of confidentiality  of another patient, after being there 38 days .  Pt stated he has Essential tremors (heriditary). Pt wears a brace for his Hernia and that's needs to be evaluated for its effectiveness  . Pt is homeless and stated he was helpless, hopeless, depressed, and anxious. Pt ETOH was 215, but pt stated he drank 1/5 of liquor. Pt stated   A:Skin was assessed and found to be clear of any abnormal marks apart from a scar on R-leg. POC and unit policies explained and understanding verbalized. Consents obtained. Food and fluids offered, and  accepted.   R:Pt had no additional questions or concerns.

## 2015-03-03 NOTE — Progress Notes (Signed)
Psychoeducational Group Note  Date: 03/03/2015 Time: 1015  Group Topic/Focus:  Making Healthy Choices:   The focus of this group is to help patients identify negative/unhealthy choices they were using prior to admission and identify positive/healthier coping strategies to replace them upon discharge.  Participation Level:  Did Not Attend  PAdditional Comments:    03/03/2015,11:46 AM Lauralyn Primes

## 2015-03-03 NOTE — BHH Group Notes (Signed)
[place] [facility] Group Notes:  (Clinical Social Work)  03/03/2015   10:00am-11:00am  Summary of Progress/Problems:  The main focus of today's process group was to discuss adding healthy supports to enhance recovery.  The need for support and types that are available was explored in the first half of group.  For the second half, patients listened to various genres of music and identified their emotional responses.  Handouts were used to record feelings evoked, as well as how patient can personally use this knowledge in sleep habits, with depression, and with other symptoms.  The patient stated he likes music, but only stayed for about 20 minutes, left.  Type of Therapy:  Music Therapy   Participation Level:  Minimal  Participation Quality:  Attentive  Affect:  Flat and Depressed  Cognitive:  Oriented  Insight:  Limited  Engagement in Therapy:  Limited  Modes of Intervention:   Activity, Exploration  Selmer Dominion, LCSW 03/03/2015, 12:30pm

## 2015-03-04 MED ORDER — LISINOPRIL 5 MG PO TABS
5.0000 mg | ORAL_TABLET | Freq: Every day | ORAL | Status: DC
  Administered 2015-03-05: 5 mg via ORAL
  Filled 2015-03-04 (×2): qty 1

## 2015-03-04 MED ORDER — LISINOPRIL 10 MG PO TABS
10.0000 mg | ORAL_TABLET | Freq: Once | ORAL | Status: AC
  Administered 2015-03-04: 10 mg via ORAL
  Filled 2015-03-04: qty 1
  Filled 2015-03-04: qty 2

## 2015-03-04 NOTE — Progress Notes (Signed)
D: Patient seen watching TV and interacting with peers. Flat affect, pleasant and cooperative. No complaint. Patient stated "I am fine".  A: Encouragement offered to patient. Due medications given as prescribed. Every 15 minutes check maintained for safety. Will continue to monitor patient for safety and stability. R: Patient remains safe

## 2015-03-04 NOTE — [facility] Group Notes (Signed)
[facility] LCSW Aftercare Discharge Planning Group Note   03/04/2015 10:04 AM  Participation Quality:  Minimal   Mood/Affect:  Depressed and Flat  Depression Rating:  1  Anxiety Rating:  1  Thoughts of Suicide:  No Will you contract for safety?   NA  Current AVH:  No  Plan for Discharge/Comments:  Pt reports that he is feeling well today but presents with depressed mood/flat affect. Pt reports that he is on waiting list for [facility] assessing. Pt was told to call "every Tuesday to check the waiting list." Pt reports no withdrawal Sx.   Transportation Means: unknown at this time.   Supports: Pt did not identify at supports during group.   [employer]

## 2015-03-04 NOTE — Progress Notes (Signed)
[facility] MD Progress Note  03/04/2015 3:14 PM Noah Medina  MRN:  [id number] Subjective:  Pt interviewed, chart reviewed. Pt reports doing "just fine". Pt reports chronic hand tremors, and pt has seen a neurologist 6 weeks ago who said "there is nothing they can do for me" and diagnosed him with essential tremors (although his mother and siblings have the same tremor). Mood is "okay". Sleep/appetite good. No withdrawal symptoms today. Pt denies SI/HI/AVH. Tolerating ativan detox protocol well. Pt reports being homeless.   Principal Problem: Alcohol abuse Diagnosis:   Patient Active Problem List   Diagnosis Date Noted  . Homicidal ideation [R45.850] 09/16/2014  . 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 Partial small bowel obstruction [K56.69] 01/31/2014  . Pancytopenia [D61.818] 01/31/2014  . 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 Hepatic encephalopathy [K72.90] 11/18/2013  . Alcohol intoxication [F10.129] 11/18/2013  . Anasarca [R60.1] 11/18/2013  . Tobacco use disorder [Z72.0] 11/18/2013  . Ventral hernia [K43.9] 11/18/2013  . Abdominal  pain, other specified site [R10.84] 08/19/2013  . Syncope [R55] 08/16/2013  . Alcohol abuse [F10.10] 08/16/2013  . GERD (gastroesophageal reflux disease) [K21.9] 08/16/2013  . Nausea and vomiting [R11.2] 08/16/2013  . Transaminitis [R74.0] 08/16/2013  . Subdural hematoma [I62.00] 04/24/2013  . Fall [W19.XXXA] 04/24/2013    Class: Acute  . Thrombocytopenia, unspecified [D69.6] 04/24/2013  . Infected seroma, postoperative [T81.4XXA, T79.2XXA] 07/13/2012  . MRSA (methicillin resistant staph aureus) culture positive [Z22.322] 07/04/2012  . Ascites [R18.8] 07/03/2012  . Normocytic anemia [D64.9] 07/03/2012  . Hyponatremia [E87.1] 07/03/2012  . Hypokalemia [E87.6] 07/02/2012  . Severe malnutrition [E41] 07/02/2012  . Status post repair of ventral hernia [Z98.89] 09/07/2011  . Seroma, postoperative, possible abscess  [T88.8XXA, T79.2XXA] 07/20/2011  . 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 Cirrhosis [K74.60] 06/29/2011   Total Time spent with patient: 30 minutes   Past Medical History:  Past Medical History  Diagnosis Date  . Cirrhosis of liver   . Hepatitis C 03-28-12    ? 80's-prev. IV drug abuse  . 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 Blood transfusion 03-28-12    '81-hx. of bleeding ulcer  . History of bleeding ulcers 03-28-12    Hx. '81  . GERD (gastroesophageal reflux disease) 03-26-12    Hx. reflux. ulcers in past, occ. OTC med  . Incisional hernia     has drainage system from abdomin  . Subdural hematoma 12/13/12    Past Surgical History  Procedure Laterality Date  . Umbilical hernia repair    . Hernia repair  08/24/11    ventral hernia with mesh  . Hernia repair  74'08    Umbilical  . Peptic ulcer  03-26-12    open peptic ulcer surgery repair- '81  . Incisional hernia repair  03/26/2012  . Incisional hernia repair  03/30/2012    Procedure: LAPAROSCOPIC INCISIONAL HERNIA;  Surgeon: Harl Bowie, MD;  Location: [facility];  Service: General;  Laterality: N/A;  Laparoscopic Incisional/ventral Hernia Repair with Mesh  . Tonsillectomy    . Appendectomy    . Incisional hernia repair  07/20/2012    mesh  . Incisional hernia repair  07/20/2012    Procedure: HERNIA REPAIR INCISIONAL;  Surgeon: Harl Bowie, MD;  Location: [facility];  Service: General;  Laterality: N/A;  repair of incisional hernia with Biologic mesh   Family History:  Family History  Problem Relation Age of Onset  . Cancer Mother  breast  . Cancer Father     brain  . Heart disease Brother   . Cancer Sister     breast  . Cancer Sister     breast   Social History:  History  Alcohol Use  . 75.6 oz/week  . 126 Cans of beer per week     History  Drug Use  . Yes  . Special: Cocaine, Marijuana, Methamphetamines, IV    Comment: "somestimes"     History   Social History  . Marital Status: Single    Spouse Name: N/A  . Number of Children: 1  . Years of Education: N/A   Occupational History  . Disabled, former Building control surveyor    Social History Main Topics  . Smoking status: Current Every Day Smoker -- 0.25 packs/day for 38 years    Types: Cigarettes  . Smokeless tobacco: Never Used  . Alcohol Use: 75.6 oz/week    126 Cans of beer per week  . Drug Use: Yes    Special: Cocaine, Marijuana, Methamphetamines, IV     Comment: "somestimes"  . Sexual Activity: No   Other Topics Concern  . None   Social History Narrative   Lives alone.  Disabled.  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  1 son.  Independent of ADLs and ambulation.     Additional History:    Sleep: Fair  Appetite:  Fair   Assessment:   Musculoskeletal: Strength & Muscle Tone: significant bilateral hand tremors (chronic) Gait & Station: normal Patient leans: N/A   Psychiatric Specialty Exam: Physical Exam  ROS  Blood pressure 120/85, pulse 110, temperature 99 F (37.2 C), temperature source Oral, resp. rate 20, height $RemoveBe'5\' 10"'IytINpHID$  (1.778 m), weight 98.884 kg (218 lb).Body mass index is 31.28 kg/(m^2).  General Appearance: Casual and Fairly Groomed  Engineer, water::  Fair  Speech:  Normal Rate  Volume:  Normal  Mood:  Euthymic  Affect:  Congruent and Constricted  Thought Process:  Coherent and Goal Directed  Orientation:  Full (Time, Place, and Person)  Thought Content:  Negative  Suicidal Thoughts:  No  Homicidal Thoughts:  No  Memory:  Immediate;   Fair Recent;   Fair Remote;   Fair  Judgement:  Fair  Insight:  Fair  Psychomotor Activity:  Tremor  Concentration:  Fair  Recall:  [employer] of Knowledge:Fair  Language: Fair  Akathisia:  Negative  Handed:  Right  AIMS (if indicated):     Assets:  Communication Skills Desire for Improvement  ADL's:  Intact  Cognition: WNL  Sleep:  Number of Hours: 5.25     Current Medications: Current  Facility-Administered Medications  Medication Dose Route Frequency Provider Last Rate Last Dose  . acetaminophen (TYLENOL) tablet 650 mg  650 mg Oral Q6H PRN Harriet Butte, NP      . alum & mag hydroxide-simeth (MAALOX/MYLANTA) 200-200-20 MG/5ML suspension 30 mL  30 mL Oral Q4H PRN Harriet Butte, NP   30 mL at 03/03/15 0911  . hydrOXYzine (ATARAX/VISTARIL) tablet 25 mg  25 mg Oral Q6H PRN Kerrie Buffalo, NP      . loperamide (IMODIUM) capsule 2-4 mg  2-4 mg Oral PRN Kerrie Buffalo, NP      . LORazepam (ATIVAN) tablet 1 mg  1 mg Oral Q6H PRN Kerrie Buffalo, NP   1 mg at 03/03/15 1660  . LORazepam (ATIVAN) tablet 1 mg  1 mg Oral TID Kerrie Buffalo, NP   1 mg at 03/04/15 1206  Followed by  . [START ON 03/05/2015] LORazepam (ATIVAN) tablet 1 mg  1 mg Oral BID Adonis Brook, NP       Followed by  . [START ON 03/06/2015] LORazepam (ATIVAN) tablet 1 mg  1 mg Oral Daily Adonis Brook, NP      . magnesium hydroxide (MILK OF MAGNESIA) suspension 30 mL  30 mL Oral Daily PRN Worthy Flank, NP      . multivitamin with minerals tablet 1 tablet  1 tablet Oral Daily Adonis Brook, NP   1 tablet at 03/04/15 [phone]  . ondansetron (ZOFRAN-ODT) disintegrating tablet 4 mg  4 mg Oral Q6H PRN Adonis Brook, NP      . pantoprazole (PROTONIX) EC tablet 40 mg  40 mg Oral Daily Adonis Brook, NP   40 mg at 03/04/15 0026  . thiamine (VITAMIN B-1) tablet 100 mg  100 mg Oral Daily Adonis Brook, NP   100 mg at 03/04/15 2854  . traZODone (DESYREL) tablet 50 mg  50 mg Oral QHS PRN Worthy Flank, NP        Lab Results:  Results for orders placed or performed during the hospital encounter of 03/02/15 (from the past 48 hour(s))  Urine Drug Screen     Status: None   Collection Time: 03/02/15 10:14 PM  Result Value Ref Range   Opiates NONE DETECTED NONE DETECTED   Cocaine NONE DETECTED NONE DETECTED   Benzodiazepines NONE DETECTED NONE DETECTED   Amphetamines NONE DETECTED NONE DETECTED   Tetrahydrocannabinol  NONE DETECTED NONE DETECTED   Barbiturates NONE DETECTED NONE DETECTED    Comment:        DRUG SCREEN FOR MEDICAL PURPOSES ONLY.  IF CONFIRMATION IS NEEDED FOR ANY PURPOSE, NOTIFY LAB WITHIN 5 DAYS.        LOWEST DETECTABLE LIMITS FOR URINE DRUG SCREEN Drug Class       Cutoff (ng/mL) Amphetamine      1000 Barbiturate      200 Benzodiazepine   200 Tricyclics       300 Opiates          300 Cocaine          300 THC              50   Acetaminophen level     Status: Abnormal   Collection Time: 03/02/15 10:34 PM  Result Value Ref Range   Acetaminophen (Tylenol), Serum <10.0 (L) 10 - 30 ug/mL    Comment:        THERAPEUTIC CONCENTRATIONS VARY SIGNIFICANTLY. A RANGE OF 10-30 ug/mL MAY BE AN EFFECTIVE CONCENTRATION FOR MANY PATIENTS. HOWEVER, SOME ARE BEST TREATED AT CONCENTRATIONS OUTSIDE THIS RANGE. ACETAMINOPHEN CONCENTRATIONS >150 ug/mL AT 4 HOURS AFTER INGESTION AND >50 ug/mL AT 12 HOURS AFTER INGESTION ARE OFTEN ASSOCIATED WITH TOXIC REACTIONS.   CBC     Status: Abnormal   Collection Time: 03/02/15 10:34 PM  Result Value Ref Range   WBC 3.9 (L) 4.0 - 10.5 K/uL   RBC 3.50 (L) 4.22 - 5.81 MIL/uL   Hemoglobin 11.7 (L) 13.0 - 17.0 g/dL   HCT 96.5 (L) 65.9 - 94.3 %   MCV 98.9 78.0 - 100.0 fL   MCH 33.4 26.0 - 34.0 pg   MCHC 33.8 30.0 - 36.0 g/dL   RDW 71.9 07.0 - 72.1 %   Platelets 48 (L) 150 - 400 K/uL    Comment: REPEATED TO VERIFY SPECIMEN CHECKED FOR CLOTS PLATELET COUNT CONFIRMED BY SMEAR  Comprehensive metabolic panel     Status: Abnormal   Collection Time: 03/02/15 10:34 PM  Result Value Ref Range   Sodium 142 135 - 145 mmol/L   Potassium 3.3 (L) 3.5 - 5.1 mmol/L    Comment: DELTA CHECK NOTED REPEATED TO VERIFY    Chloride 113 (H) 96 - 112 mmol/L   CO2 23 19 - 32 mmol/L   Glucose, Bld 125 (H) 70 - 99 mg/dL   BUN 10 6 - 23 mg/dL   Creatinine, Ser 1.05 0.50 - 1.35 mg/dL   Calcium 8.5 8.4 - 10.5 mg/dL   Total Protein 6.4 6.0 - 8.3 g/dL   Albumin 3.1  (L) 3.5 - 5.2 g/dL   AST 58 (H) 0 - 37 U/L   ALT 34 0 - 53 U/L   Alkaline Phosphatase 82 39 - 117 U/L   Total Bilirubin 1.6 (H) 0.3 - 1.2 mg/dL   GFR calc non Af Amer 76 (L) >90 mL/min   GFR calc Af Amer 88 (L) >90 mL/min    Comment: (NOTE) The eGFR has been calculated using the CKD EPI equation. This calculation has not been validated in all clinical situations. eGFR's persistently <90 mL/min signify possible Chronic Kidney Disease.    Anion gap 6 5 - 15  Ethanol (ETOH)     Status: Abnormal   Collection Time: 03/02/15 10:34 PM  Result Value Ref Range   Alcohol, Ethyl (B) 215 (H) 0 - 9 mg/dL    Comment:        LOWEST DETECTABLE LIMIT FOR SERUM ALCOHOL IS 11 mg/dL FOR MEDICAL PURPOSES ONLY   Salicylate level     Status: None   Collection Time: 03/02/15 10:34 PM  Result Value Ref Range   Salicylate Lvl <7.9 2.8 - 20.0 mg/dL    Physical Findings: AIMS: Facial and Oral Movements Muscles of Facial Expression: None, normal Lips and Perioral Area: None, normal Jaw: None, normal Tongue: None, normal,Extremity Movements Upper (arms, wrists, hands, fingers): None, normal Lower (legs, knees, ankles, toes): None, normal, Trunk Movements Neck, shoulders, hips: None, normal, Overall Severity Severity of abnormal movements (highest score from questions above): None, normal Incapacitation due to abnormal movements: None, normal Patient's awareness of abnormal movements (rate only patient's report): No Awareness, Dental Status Current problems with teeth and/or dentures?: No Does patient usually wear dentures?: No  CIWA:  CIWA-Ar Total: 5 COWS:  COWS Total Score: 1  Treatment Plan Summary: Daily contact with patient to assess and evaluate symptoms and progress in treatment, Medication management and Plan continue ativan protocol for alcohol detox   Medical Decision Making:  Established Problem, Stable/Improving (1), Review of Psycho-Social Stressors (1), Review or order clinical lab  tests (1) and Review of Medication Regimen & Side Effects (2)     [place], Neomia Herbel 03/04/2015, 3:14 PM

## 2015-03-04 NOTE — Progress Notes (Signed)
D: Patient denies SI/HI and A/V hallucinations; patient reports sleep is good; reports appetite is good; reports energy level is good ; reports ability to concentrate is good; rates depression as 0/10; rates hopelessness 0/10; rates anxiety as 0/10; patient reports that he wants to go home  A: Monitored q 15 minutes; patient encouraged to attend groups; patient educated about medications; patient given medications per physician orders; patient encouraged to express feelings and/or concerns  R: Patient is flat and blunted; patient is assertive and denies all withdrawal symptoms;  patient's interaction with staff and peers is minimal; patient was able to set goal to talk with staff 1:1 when having feelings of SI; patient is taking medications as prescribed and tolerating medications; patient is not attending all groups

## 2015-03-04 NOTE — Progress Notes (Signed)
Patient denies withdrawal symptoms but patient has moderate tremors; ; patient reports " I always shake like this and there is nothing you can do about it"; Dr. Aneta Mins made aware of the tremors and there where no order given at this time; Dr. Aneta Mins was also made aware of the patient's potassium level of 3.3 on 3/19 /16 and in the system it appears that it was not addressed; Dr. Aneta Mins ordered at [facility] for 03/05/15

## 2015-03-04 NOTE — Progress Notes (Signed)
Recreation Therapy Notes  Date: 03.21.2016 Time: 9:30am Location: [address] Group Room   Group Topic: Stress Management  Goal Area(s) Addresses:  Patient will actively participate in stress management techniques presented during session.   Behavioral Response: Did not attend.   Laureen Ochs Jathan Balling, LRT/CTRS        Caryle Helgeson L 03/04/2015 10:11 AM

## 2015-03-04 NOTE — Tx Team (Signed)
Interdisciplinary Treatment Plan Update (Adult)   Date: 03/04/2015   Time Reviewed: 11:59 AM  Progress in Treatment:  Attending groups: yes  Participating in groups:  Minimally Taking medication as prescribed: Yes  Tolerating medication: Yes  Family/Significant othe contact made: Not yet. SPE required for this pt.   Patient understands diagnosis: Yes, AEB seeking treatment for SI with plan, increased depression, ETOH detox, and for medication stabilization.  Discussing patient identified problems/goals with staff: Yes  Medical problems stabilized or resolved: Yes  Denies suicidal/homicidal ideation: Yes during self report/group.  Patient has not harmed self or Others: Yes  New problem(s) identified:  Discharge Plan or Barriers: Pt plans to go to [facility] for inpatient treatment. "I'm 6th on the waitlist and have told to call every Tuesday." CSW assessing.  Additional comments:  Per Noah Medina is an 59 y.o. male who was transported to the ED voluntarily by [employer]. Patient reports that he drank a fifth of '[place]" and reports that he wants to kill himself because no one loves him. 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 Patient reports that he is homeless and currently lives in a hotel but plans to kill himself in the woods. Patient reports that he drinks alcohol everyday and drinks about a fifth per day. Patient reports that he does not have any support in his family and stated "no one cares about me." Patient repeated several times that he was going to kills himself and that no one would stop him. Patient reports that he was "kicked out" of [facility] and is not currently receiving any other services. Patient denies HI and psychosis. "I've been seen at [facility] but not in a hospital. It is drinking that will kill me." He states he has been an alcoholic for 30 years. He is on disability. He  denies having depression. He was treated at [facility] in [place] and afterwards at [facility].  Reason for Continuation of Hospitalization: Ativan taper-withdrawals Mood stabilization/depression Medication stabilization Estimated length of stay: 3-5 days  For review of initial/current patient goals, please see plan of care.  Attendees:  Patient:    Family:    Physician: Dr. Aneta Mins MD 03/04/2015   Nursing: Arminda Resides RN; Gari Crown RN 03/04/2015   Clinical Social Worker [place], [place]  03/04/2015   Other: Reginold Agent; Barrie Lyme 03/04/2015   Other: Gerline Legacy Nurse CM 03/04/2015   Other: Hilda Lias, Community Care Coordinator  03/04/2015   Other:    Scribe for Treatment Team:  Nira Conn Smart [place] 03/04/2015 12:03 PM

## 2015-03-04 NOTE — BHH Group Notes (Signed)
[place] LCSW Group Therapy 03/04/2015  1:15 pm  Type of Therapy: Group Therapy Participation Level: Active  Participation Quality: Attentive, Sharing and Supportive  Affect: Depressed and Flat  Cognitive: Alert and Oriented  Insight: Developing/Improving and Engaged  Engagement in Therapy: Developing/Improving and Engaged  Modes of Intervention: Clarification, Confrontation, Discussion, Education, Exploration,  Limit-setting, Orientation, Problem-solving, Rapport Building, Art therapist, Socialization and Support  Summary of Progress/Problems: Pt identified obstacles faced currently and processed barriers involved in overcoming these obstacles. Pt identified steps necessary for overcoming these obstacles and explored motivation (internal and external) for facing these difficulties head on. Pt further identified one area of concern in their lives and chose a goal to focus on for today. Patient identified his goal as to return home, patient unable to verbalize how hospitalization could be beneficial to him. Patient did report that [facility] meetings have been beneficial to him in the past. CSW and other group members provided emotional support and encouragement to patient.   Tilden Fossa, MSW, [place] Worker [facility] [phone]

## 2015-03-05 LAB — BASIC METABOLIC PANEL
ANION GAP: 7 (ref 5–15)
BUN: 11 mg/dL (ref 6–23)
CO2: 26 mmol/L (ref 19–32)
CREATININE: 1.3 mg/dL (ref 0.50–1.35)
Calcium: 8.6 mg/dL (ref 8.4–10.5)
Chloride: 109 mmol/L (ref 96–112)
GFR calc Af Amer: 68 mL/min — ABNORMAL LOW (ref >90)
GFR calc non Af Amer: 59 mL/min — ABNORMAL LOW (ref >90)
Glucose, Bld: 67 mg/dL — ABNORMAL LOW (ref 70–99)
Potassium: 3.7 mmol/L (ref 3.5–5.1)
Sodium: 142 mmol/L (ref 135–145)

## 2015-03-05 MED ORDER — FAMOTIDINE 20 MG PO TABS
20.0000 mg | ORAL_TABLET | Freq: Two times a day (BID) | ORAL | Status: DC
  Administered 2015-03-05 – 2015-03-07 (×4): 20 mg via ORAL
  Filled 2015-03-05 (×4): qty 1
  Filled 2015-03-05: qty 6
  Filled 2015-03-05 (×3): qty 1
  Filled 2015-03-05: qty 6

## 2015-03-05 MED ORDER — DICYCLOMINE HCL 10 MG PO CAPS
10.0000 mg | ORAL_CAPSULE | Freq: Three times a day (TID) | ORAL | Status: DC
  Administered 2015-03-05 – 2015-03-07 (×6): 10 mg via ORAL
  Filled 2015-03-05 (×5): qty 1
  Filled 2015-03-05: qty 12
  Filled 2015-03-05 (×3): qty 1
  Filled 2015-03-05: qty 12
  Filled 2015-03-05: qty 1
  Filled 2015-03-05: qty 12
  Filled 2015-03-05 (×3): qty 1
  Filled 2015-03-05: qty 12

## 2015-03-05 MED ORDER — KETOROLAC TROMETHAMINE 15 MG/ML IJ SOLN
15.0000 mg | Freq: Once | INTRAMUSCULAR | Status: AC
  Administered 2015-03-05: 15 mg via INTRAMUSCULAR
  Filled 2015-03-05 (×2): qty 1

## 2015-03-05 MED ORDER — POTASSIUM CHLORIDE CRYS ER 20 MEQ PO TBCR
20.0000 meq | EXTENDED_RELEASE_TABLET | Freq: Two times a day (BID) | ORAL | Status: AC
  Administered 2015-03-05 – 2015-03-07 (×4): 20 meq via ORAL
  Filled 2015-03-05 (×5): qty 1

## 2015-03-05 MED ORDER — ATENOLOL 25 MG PO TABS
25.0000 mg | ORAL_TABLET | Freq: Every day | ORAL | Status: DC
  Administered 2015-03-06 – 2015-03-07 (×2): 25 mg via ORAL
  Filled 2015-03-05: qty 3
  Filled 2015-03-05 (×4): qty 1

## 2015-03-05 NOTE — BHH Group Notes (Signed)
Adult Psychoeducational Group Note  Date:  03/05/2015 Time:  10:23 PM  Group Topic/Focus:  AA Meeting  Participation Level:  Did Not Attend  Participation Quality:  None  Affect:  None  Cognitive:  None  Insight: None  Engagement in Group:  None  Modes of Intervention:  Discussion and Education  Additional Comments:  [place] did not attend group.  Victorino Sparrow A 03/05/2015, 10:23 PM

## 2015-03-05 NOTE — Plan of Care (Signed)
Problem: Alteration in mood Goal: LTG-Patient reports reduction in suicidal thoughts (Patient reports reduction in suicidal thoughts and is able to verbalize a safety plan for whenever patient is feeling suicidal)  Outcome: Progressing Pt denies SI at this time     

## 2015-03-05 NOTE — Clinical Social Work Note (Signed)
CSW spoke with intake coordinator this morning at [facility] [phone] is still 6th on waitlist but should be moving to 4th in the next few days. He could not give an estimated time for admission for pt. Pt may have to d/c home prior to admission. CSW assessing.  [place], [place]  03/05/2015 9:55 AM

## 2015-03-05 NOTE — Significant Event (Cosign Needed)
Patient presented to NS at 2000 hours with c/o of dull and achy rated 8/10 generalized Abd pain. Patient is passing flatus and reports last BM 2 days ago. He is denying any localization of pain ,N/V, anorexia,passing of melenotic stools, hematochezia or constipation concerns. Patient has a remote hx of PUD with surgical repair and has multiple subsequent abd hernia repairs since.  V/S wnl  Abd, exo obese, soft with normoactive BS, noted soft fluctuant , reducible midline ventral hernia. No palpable masses noted.  A/P: Generalized ABD pain (non surgical), likely secondary to IBS subsequent to alcohol w/d symptoms         Patient reassurance given,Pepcid 20 mg BID,Bentyl 10 mg QID, Toradol 15 Mg IM, continue with PPI Rx   [place] MD on call

## 2015-03-05 NOTE — BHH Group Notes (Signed)
[place] Group Notes:  (Nursing/MHT/Case Management/Adjunct)  Date:  03/05/2015  Time:  0900 am  Type of Therapy:  Psychoeducational Skills  Participation Level:  Did Not Attend  Zipporah Plants 03/05/2015, 1:57 PM

## 2015-03-05 NOTE — Progress Notes (Signed)
D: Pt's mood is depressed. He denies any withdrawal symptoms but tremors noted that may or may not be attributed to withdrawal. Pt denies SI/HI. A: Support given. Verbalization encouraged. Pt encouraged to come to staff with any concerns. Medications given as prescribed.  R: Pt is receptive. No complaints of pain or discomfort at this time. Q15 min safety checks maintained. Will continue to monitor.

## 2015-03-05 NOTE — BHH Group Notes (Signed)
[place] LCSW Group Therapy  03/05/2015 1:25 PM  Type of Therapy:  Group Therapy  Participation Level:  Did Not Attend-pt invited/chose to rest in room.  Summary of Progress/Problems:MHA Speaker came to talk about his personal journey with substance abuse and addiction. The pts processed ways by which to relate to the speaker. [place] speaker provided handouts and educational information pertaining to groups and services offered by the [facility].   Smart, Katiria Calame LCSWA 03/05/2015, 1:25 PM

## 2015-03-05 NOTE — Progress Notes (Signed)
D: Patient denies SI/HI and A/V hallucinations; patient denies all withdrawal symptoms;   A: Monitored q 15 minutes; patient encouraged to attend groups; patient educated about medications; patient given medications per physician orders; patient encouraged to express feelings and/or concerns  R: Patient is cooperative and pleasant; patient is reporting that he is ready to go home;  patient's interaction with staff and peers is minimal; patient was able to set goal to talk with staff 1:1 when having feelings of SI; patient is taking medications as prescribed and tolerating medications; patient is attending all groups

## 2015-03-05 NOTE — Progress Notes (Signed)
Patient ID: Noah Medina, male   DOB: 1956-03-18, 59 y.o.   MRN: [id number] [facility] MD Progress Note  03/05/2015 5:54 PM ZADRIAN MCCAULEY  MRN:  [id number]  Subjective:  Pt interviewed, chart reviewed. [name] says he is doing well. Says he thinks his withdrawal symptoms are getting better. Says he is not depressed, rather alcoholism is his problem. [name] adds that his tremors has been with him since childhood, inherited from his mother. Says a neurologist had informed him that this is what it is, there is not much they can do to help him. [name] was started on Atenolol 25 mg daily today for HTN/essential tremors. He is in no apparent distress.    Principal Problem: Alcohol abuse Diagnosis:   Patient Active Problem List   Diagnosis Date Noted  . Homicidal ideation [R45.850] 09/16/2014  . 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 Partial small bowel obstruction [K56.69] 01/31/2014  . Pancytopenia [D61.818] 01/31/2014  . 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 Hepatic encephalopathy [K72.90] 11/18/2013  . Alcohol intoxication [F10.129] 11/18/2013  . Anasarca [R60.1] 11/18/2013  . Tobacco use disorder [Z72.0] 11/18/2013  . Ventral hernia [K43.9] 11/18/2013  . Abdominal  pain, other specified site [R10.84] 08/19/2013  . Syncope [R55] 08/16/2013  . Alcohol abuse [F10.10] 08/16/2013  . GERD (gastroesophageal reflux disease) [K21.9] 08/16/2013  . Nausea and vomiting [R11.2] 08/16/2013  . Transaminitis [R74.0] 08/16/2013  . Subdural hematoma [I62.00] 04/24/2013  . Fall [W19.XXXA] 04/24/2013    Class: Acute  . Thrombocytopenia, unspecified [D69.6] 04/24/2013  . Infected seroma, postoperative [T81.4XXA, T79.2XXA] 07/13/2012  . MRSA (methicillin resistant staph aureus) culture positive [Z22.322] 07/04/2012  . Ascites [R18.8] 07/03/2012  . Normocytic anemia [D64.9] 07/03/2012  . Hyponatremia [E87.1] 07/03/2012  . Hypokalemia [E87.6] 07/02/2012  . Severe malnutrition  [E41] 07/02/2012  . Status post repair of ventral hernia [Z98.89] 09/07/2011  . Seroma, postoperative, possible abscess [T88.8XXA, T79.2XXA] 07/20/2011  . 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 Cirrhosis [K74.60] 06/29/2011   Total Time spent with patient: 30 minutes  Past Medical History:  Past Medical History  Diagnosis Date  . Cirrhosis of liver   . Hepatitis C 03-28-12    ? 80's-prev. IV drug abuse  . 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 Blood transfusion 03-28-12    '81-hx. of bleeding ulcer  . History of bleeding ulcers 03-28-12    Hx. '81  . GERD (gastroesophageal reflux disease) 03-26-12    Hx. reflux. ulcers in past, occ. OTC med  . Incisional hernia     has drainage system from abdomin  . Subdural hematoma 12/13/12    Past Surgical History  Procedure Laterality Date  . Umbilical hernia repair    . Hernia repair  08/24/11    ventral hernia with mesh  . Hernia repair  56'21    Umbilical  . Peptic ulcer  03-26-12    open peptic ulcer surgery repair- '81  . Incisional hernia repair  03/26/2012  . Incisional hernia repair  03/30/2012    Procedure: LAPAROSCOPIC INCISIONAL HERNIA;  Surgeon: Harl Bowie, MD;  Location: [facility];  Service: General;  Laterality: N/A;  Laparoscopic Incisional/ventral Hernia Repair with Mesh  . Tonsillectomy    . Appendectomy    . Incisional hernia repair  07/20/2012    mesh  . Incisional hernia repair  07/20/2012    Procedure: HERNIA REPAIR INCISIONAL;  Surgeon: Harl Bowie, MD;  Location: [facility];  Service: General;  Laterality: N/A;  repair of incisional hernia with Biologic mesh   Family History:  Family History  Problem Relation Age of Onset  . Cancer Mother     breast  . Cancer Father     brain  . Heart disease Brother   . Cancer Sister     breast  . Cancer Sister     breast   Social History:  History  Alcohol Use  . 75.6 oz/week  . 126 Cans of beer per week      History  Drug Use  . Yes  . Special: Cocaine, Marijuana, Methamphetamines, IV    Comment: "somestimes"    History   Social History  . Marital Status: Single    Spouse Name: N/A  . Number of Children: 1  . Years of Education: N/A   Occupational History  . Disabled, former Building control surveyor    Social History Main Topics  . Smoking status: Current Every Day Smoker -- 0.25 packs/day for 38 years    Types: Cigarettes  . Smokeless tobacco: Never Used  . Alcohol Use: 75.6 oz/week    126 Cans of beer per week  . Drug Use: Yes    Special: Cocaine, Marijuana, Methamphetamines, IV     Comment: "somestimes"  . Sexual Activity: No   Other Topics Concern  . None   Social History Narrative   Lives alone.  Disabled.  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  1 son.  Independent of ADLs and ambulation.     Additional History:    Sleep: Fair  Appetite:  Fair  Assessment:   Musculoskeletal: Strength & Muscle Tone: significant bilateral hand tremors (chronic) Gait & Station: normal Patient leans: N/A  Psychiatric Specialty Exam: Physical Exam  ROS  Blood pressure 136/65, pulse 79, temperature 97.9 F (36.6 C), temperature source Oral, resp. rate 20, height 5\' 10"  (1.778 m), weight 98.884 kg (218 lb).Body mass index is 31.28 kg/(m^2).  General Appearance: Casual and Fairly Groomed  Engineer, water::  Fair  Speech:  Normal Rate  Volume:  Normal  Mood:  Euthymic  Affect:  Congruent and Constricted  Thought Process:  Coherent and Goal Directed  Orientation:  Full (Time, Place, and Person)  Thought Content:  Negative  Suicidal Thoughts:  No  Homicidal Thoughts:  No  Memory:  Immediate;   Fair Recent;   Fair Remote;   Fair  Judgement:  Fair  Insight:  Fair  Psychomotor Activity:  Tremor (Esssential) not necessarily alcohol related.  Concentration:  Fair  Recall:  [employer] of Knowledge:Fair  Language: Fair  Akathisia:  Negative  Handed:  Right  AIMS (if indicated):     Assets:   Communication Skills Desire for Improvement  ADL's:  Intact  Cognition: WNL  Sleep:  Number of Hours: 5   Current Medications: Current Facility-Administered Medications  Medication Dose Route Frequency Provider Last Rate Last Dose  . acetaminophen (TYLENOL) tablet 650 mg  650 mg Oral Q6H PRN Harriet Butte, NP      . alum & mag hydroxide-simeth (MAALOX/MYLANTA) 200-200-20 MG/5ML suspension 30 mL  30 mL Oral Q4H PRN Harriet Butte, NP   30 mL at 03/03/15 0911  . atenolol (TENORMIN) tablet 25 mg  25 mg Oral Daily Encarnacion Slates, NP   Stopped at 03/05/15 1045  . hydrOXYzine (ATARAX/VISTARIL) tablet 25 mg  25 mg Oral Q6H PRN Kerrie Buffalo, NP   25 mg at 03/04/15 1646  . loperamide (IMODIUM) capsule 2-4  mg  2-4 mg Oral PRN Kerrie Buffalo, NP      . LORazepam (ATIVAN) tablet 1 mg  1 mg Oral Q6H PRN Kerrie Buffalo, NP   1 mg at 03/04/15 2132  . [START ON 03/06/2015] LORazepam (ATIVAN) tablet 1 mg  1 mg Oral Daily Kerrie Buffalo, NP      . magnesium hydroxide (MILK OF MAGNESIA) suspension 30 mL  30 mL Oral Daily PRN Harriet Butte, NP      . multivitamin with minerals tablet 1 tablet  1 tablet Oral Daily Kerrie Buffalo, NP   1 tablet at 03/05/15 0824  . ondansetron (ZOFRAN-ODT) disintegrating tablet 4 mg  4 mg Oral Q6H PRN Kerrie Buffalo, NP      . pantoprazole (PROTONIX) EC tablet 40 mg  40 mg Oral Daily Kerrie Buffalo, NP   40 mg at 03/05/15 0824  . thiamine (VITAMIN B-1) tablet 100 mg  100 mg Oral Daily Kerrie Buffalo, NP   100 mg at 03/05/15 0824  . traZODone (DESYREL) tablet 50 mg  50 mg Oral QHS PRN Harriet Butte, NP   50 mg at 03/04/15 2132   Lab Results:  No results found for this or any previous visit (from the past 48 hour(s)).  Physical Findings: AIMS: Facial and Oral Movements Muscles of Facial Expression: None, normal Lips and Perioral Area: None, normal Jaw: None, normal Tongue: None, normal,Extremity Movements Upper (arms, wrists, hands, fingers): None, normal Lower (legs,  knees, ankles, toes): None, normal, Trunk Movements Neck, shoulders, hips: None, normal, Overall Severity Severity of abnormal movements (highest score from questions above): None, normal Incapacitation due to abnormal movements: None, normal Patient's awareness of abnormal movements (rate only patient's report): No Awareness, Dental Status Current problems with teeth and/or dentures?: No Does patient usually wear dentures?: No  CIWA:  CIWA-Ar Total: 4 COWS:  COWS Total Score: 1  Treatment Plan Summary: Daily contact with patient to assess and evaluate symptoms and progress in treatment, Medication management and Plan continue ativan protocol for alcohol detox, initiate Tenormin 25 mg daily for HTN/essential tremors, continue trazodone 50 mg for insomnia.  Medical Decision Making:  Established Problem, Stable/Improving (1), Review of Psycho-Social Stressors (1), Review or order clinical lab tests (1) and Review of Medication Regimen & Side Effects (2)  Encarnacion Slates, PMHNp, FNP-BC 03/05/2015, 5:54 PM

## 2015-03-05 NOTE — Progress Notes (Signed)
D: Pt denies SI/HI/AVH. Pt is pleasant and cooperative. Pt focused on stomach pain, pt forwards little other than anything involving his stomach.   A: Pt was offered support and encouragement. Pt was given scheduled medications. Pt was encourage to attend groups. Q 15 minute checks were done for safety.   R:Pt attends groups and interacts well with peers and staff. Pt is taking medication. Pt receptive to treatment and safety maintained on unit.

## 2015-03-05 NOTE — Progress Notes (Signed)
Pt complained about stomach hurting, minor assessment done, pt stated he goes to the hospital when this happens. Pt was seen by PA , Patriciaann Clan and pt was not classified as being in distress.

## 2015-03-06 LAB — BASIC METABOLIC PANEL
Anion gap: 7 (ref 5–15)
BUN: 14 mg/dL (ref 6–23)
CALCIUM: 8.2 mg/dL — AB (ref 8.4–10.5)
CO2: 24 mmol/L (ref 19–32)
Chloride: 110 mmol/L (ref 96–112)
Creatinine, Ser: 1.21 mg/dL (ref 0.50–1.35)
GFR, EST AFRICAN AMERICAN: 74 mL/min — AB (ref >90)
GFR, EST NON AFRICAN AMERICAN: 64 mL/min — AB (ref >90)
Glucose, Bld: 82 mg/dL (ref 70–99)
POTASSIUM: 3.9 mmol/L (ref 3.5–5.1)
SODIUM: 141 mmol/L (ref 135–145)

## 2015-03-06 NOTE — Progress Notes (Signed)
Recreation Therapy Notes  Date: 03.23.2016 Time: 9:30am Location: [address] Day[address]   Group Topic: Stress Management  Goal Area(s) Addresses:  Patient will actively participate in stress management techniques presented during session.   Behavioral Response: Did not attend.   Laureen Ochs Meenakshi Sazama, LRT/CTRS  Lane Hacker 03/06/2015 4:04 PM

## 2015-03-06 NOTE — BHH Group Notes (Signed)
[facility] LCSW Aftercare Discharge Planning Group Note   03/06/2015 9:59 AM  Participation Quality:  Invited-DID NOT ATTEND. Pt in room sleeping.   Smart, [employer]

## 2015-03-06 NOTE — BHH Group Notes (Signed)
[place] LCSW Group Therapy  03/06/2015 4:01 PM  Type of Therapy:  Group Therapy  Participation Level:  Did Not Attend-pt invited. Refused and remained in bed.   Summary of Progress/Problems: Emotion Regulation: This group focused on both positive and negative emotion identification and allowed group members to process ways to identify feelings, regulate negative emotions, and find healthy ways to manage internal/external emotions. Group members were asked to reflect on a time when their reaction to an emotion led to a negative outcome and explored how alternative responses using emotion regulation would have benefited them. Group members were also asked to discuss a time when emotion regulation was utilized when a negative emotion was experienced.   Smart, Kingsten Enfield LCSWA  03/06/2015, 4:01 PM

## 2015-03-06 NOTE — Progress Notes (Signed)
D: Patient denies SI/HI and A/V hallucinations; patient denies withdrawal symptoms  A: Monitored q 15 minutes; patient encouraged to attend groups; patient educated about medications; patient given medications per physician orders; patient encouraged to express feelings and/or concerns  R: Patient wants to go home; patient is cooperative; patient patient's interaction with staff and peers is very minimal; patient was able to set goal to talk with staff 1:1 when having feelings of SI; patient is taking medications as prescribed and tolerating medications; patient is attending all groups  Patient signed a 72 hour discharge on 03/06/15 at 1528 hrs

## 2015-03-06 NOTE — Progress Notes (Signed)
D: Pt denies SI/HI/AVH. Pt is pleasant and cooperative. Pt avoids Probation officer, but seen interacting on unit some.   A: Pt was offered support and encouragement. Pt was given scheduled medications. Pt was encourage to attend groups. Q 15 minute checks were done for safety.   R:Pt attends groups and interacts well with peers and staff. Pt is taking medication. Pt has no complaints at this time .Pt receptive to treatment and safety maintained on unit.

## 2015-03-06 NOTE — Progress Notes (Signed)
Recreation Therapy Notes  Animal-Assisted Activity/Therapy (AAA/T) Program Checklist/Progress Notes Patient Eligibility Criteria Checklist & Daily Group note for Rec Tx Intervention  Date: 03.22.2016 Time: 2:45pm Location: [address]   AAA/T Program Assumption of Risk Form signed by Patient/ or Parent Legal Guardian yes  Patient is free of allergies or sever asthma yes  Patient reports no fear of animals yes  Patient reports no history of cruelty to animals yes  Patient understands his/her participation is voluntary yes  Behavioral Response: Did not attend.   Laureen Ochs Dyron [place], LRT/CTRS  Valery Amedee L 03/06/2015 8:20 AM

## 2015-03-06 NOTE — Progress Notes (Signed)
Patient ID: Noah Medina, male   DOB: Dec 06, 1956, 59 y.o.   MRN: [id number] [facility] MD Progress Note  03/06/2015 4:55 PM Noah Medina  MRN:  [id number]  Subjective:  Pt interviewed, chart reviewed. Pt was resting in his room when interviewed. Noah Medina says he is doing well. Says he thinks his withdrawal symptoms are gone. Says he is not depressed, rather alcoholism is his problem. Noah Medina adds that his tremors has been with him since childhood, inherited from his mother. Says a neurologist had informed him that this is what it is, there is not much they can do to help him. Noah Medina was started on Atenolol 25 mg daily yesterday for HTN/essential tremors. He is in no apparent distress. Pt reports readiness for discharge.    Principal Problem: Alcohol abuse Diagnosis:   Patient Active Problem List   Diagnosis Date Noted  . Homicidal ideation [R45.850] 09/16/2014  . 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 Partial small bowel obstruction [K56.69] 01/31/2014  . Pancytopenia [D61.818] 01/31/2014  . 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 Hepatic encephalopathy [K72.90] 11/18/2013  . Alcohol intoxication [F10.129] 11/18/2013  . Anasarca [R60.1] 11/18/2013  . Tobacco use disorder [Z72.0] 11/18/2013  . Ventral hernia [K43.9] 11/18/2013  . Abdominal  pain, other specified site [R10.84] 08/19/2013  . Syncope [R55] 08/16/2013  . Alcohol abuse [F10.10] 08/16/2013  . GERD (gastroesophageal reflux disease) [K21.9] 08/16/2013  . Nausea and vomiting [R11.2] 08/16/2013  . Transaminitis [R74.0] 08/16/2013  . Subdural hematoma [I62.00] 04/24/2013  . Fall [W19.XXXA] 04/24/2013    Class: Acute  . Thrombocytopenia, unspecified [D69.6] 04/24/2013  . Infected seroma, postoperative [T81.4XXA, T79.2XXA] 07/13/2012  . MRSA (methicillin resistant staph aureus) culture positive [Z22.322] 07/04/2012  . Ascites [R18.8] 07/03/2012  . Normocytic anemia [D64.9] 07/03/2012  . Hyponatremia  [E87.1] 07/03/2012  . Hypokalemia [E87.6] 07/02/2012  . Severe malnutrition [E41] 07/02/2012  . Status post repair of ventral hernia [Z98.89] 09/07/2011  . Seroma, postoperative, possible abscess [T88.8XXA, T79.2XXA] 07/20/2011  . 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 Cirrhosis [K74.60] 06/29/2011   Total Time spent with patient: 15 minutes  Past Medical History:  Past Medical History  Diagnosis Date  . Cirrhosis of liver   . Hepatitis C 03-28-12    ? 80's-prev. IV drug abuse  . 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 Blood transfusion 03-28-12    '81-hx. of bleeding ulcer  . History of bleeding ulcers 03-28-12    Hx. '81  . GERD (gastroesophageal reflux disease) 03-26-12    Hx. reflux. ulcers in past, occ. OTC med  . Incisional hernia     has drainage system from abdomin  . Subdural hematoma 12/13/12    Past Surgical History  Procedure Laterality Date  . Umbilical hernia repair    . Hernia repair  08/24/11    ventral hernia with mesh  . Hernia repair  40'08    Umbilical  . Peptic ulcer  03-26-12    open peptic ulcer surgery repair- '81  . Incisional hernia repair  03/26/2012  . Incisional hernia repair  03/30/2012    Procedure: LAPAROSCOPIC INCISIONAL HERNIA;  Surgeon: Harl Bowie, MD;  Location: [facility];  Service: General;  Laterality: N/A;  Laparoscopic Incisional/ventral Hernia Repair with Mesh  . Tonsillectomy    . Appendectomy    . Incisional hernia repair  07/20/2012    mesh  . Incisional hernia repair  07/20/2012    Procedure: HERNIA  REPAIR INCISIONAL;  Surgeon: Harl Bowie, MD;  Location: [facility];  Service: General;  Laterality: N/A;  repair of incisional hernia with Biologic mesh   Family History:  Family History  Problem Relation Age of Onset  . Cancer Mother     breast  . Cancer Father     brain  . Heart disease Brother   . Cancer Sister     breast  . Cancer Sister     breast   Social  History:  History  Alcohol Use  . 75.6 oz/week  . 126 Cans of beer per week     History  Drug Use  . Yes  . Special: Cocaine, Marijuana, Methamphetamines, IV    Comment: "somestimes"    History   Social History  . Marital Status: Single    Spouse Name: N/A  . Number of Children: 1  . Years of Education: N/A   Occupational History  . Disabled, former Building control surveyor    Social History Main Topics  . Smoking status: Current Every Day Smoker -- 0.25 packs/day for 38 years    Types: Cigarettes  . Smokeless tobacco: Never Used  . Alcohol Use: 75.6 oz/week    126 Cans of beer per week  . Drug Use: Yes    Special: Cocaine, Marijuana, Methamphetamines, IV     Comment: "somestimes"  . Sexual Activity: No   Other Topics Concern  . None   Social History Narrative   Lives alone.  Disabled.  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  1 son.  Independent of ADLs and ambulation.     Additional History:    Sleep: Fair  Appetite:  Fair  Assessment:   Musculoskeletal: Strength & Muscle Tone: significant bilateral hand tremors (chronic) Gait & Station: normal Patient leans: N/A  Psychiatric Specialty Exam: Physical Exam  ROS  Blood pressure 132/61, pulse 86, temperature 98.7 F (37.1 C), temperature source Oral, resp. rate 20, height $RemoveBe'5\' 10"'HVUfrKpHn$  (1.778 m), weight 98.884 kg (218 lb).Body mass index is 31.28 kg/(m^2).  General Appearance: Casual and Fairly Groomed  Engineer, water::  Fair  Speech:  Normal Rate  Volume:  Normal  Mood:  Euthymic  Affect:  Congruent and Constricted  Thought Process:  Coherent and Goal Directed  Orientation:  Full (Time, Place, and Person)  Thought Content:  Negative  Suicidal Thoughts:  No  Homicidal Thoughts:  No  Memory:  Immediate;   Fair Recent;   Fair Remote;   Fair  Judgement:  Fair  Insight:  Fair  Psychomotor Activity:  Tremor (Essential) not necessarily alcohol related.  Concentration:  Fair  Recall:  [employer] of Knowledge:Fair  Language:  Fair  Akathisia:  Negative  Handed:  Right  AIMS (if indicated):     Assets:  Communication Skills Desire for Improvement  ADL's:  Intact  Cognition: WNL  Sleep:  Number of Hours: 6.25   Current Medications: Current Facility-Administered Medications  Medication Dose Route Frequency Provider Last Rate Last Dose  . acetaminophen (TYLENOL) tablet 650 mg  650 mg Oral Q6H PRN Harriet Butte, NP      . alum & mag hydroxide-simeth (MAALOX/MYLANTA) 200-200-20 MG/5ML suspension 30 mL  30 mL Oral Q4H PRN Harriet Butte, NP   30 mL at 03/03/15 0911  . atenolol (TENORMIN) tablet 25 mg  25 mg Oral Daily Encarnacion Slates, NP   25 mg at 03/06/15 0829  . dicyclomine (BENTYL) capsule 10 mg  10 mg Oral TID AC & HS  Laverle Hobby, PA-C   10 mg at 03/06/15 1210  . famotidine (PEPCID) tablet 20 mg  20 mg Oral BID Laverle Hobby, PA-C   20 mg at 03/06/15 0830  . magnesium hydroxide (MILK OF MAGNESIA) suspension 30 mL  30 mL Oral Daily PRN Harriet Butte, NP      . multivitamin with minerals tablet 1 tablet  1 tablet Oral Daily Kerrie Buffalo, NP   1 tablet at 03/06/15 0830  . pantoprazole (PROTONIX) EC tablet 40 mg  40 mg Oral Daily Kerrie Buffalo, NP   40 mg at 03/06/15 0830  . potassium chloride SA (K-DUR,KLOR-CON) CR tablet 20 mEq  20 mEq Oral BID Laverle Hobby, PA-C   20 mEq at 03/06/15 0831  . thiamine (VITAMIN B-1) tablet 100 mg  100 mg Oral Daily Kerrie Buffalo, NP   100 mg at 03/06/15 0830  . traZODone (DESYREL) tablet 50 mg  50 mg Oral QHS PRN Harriet Butte, NP   50 mg at 03/05/15 2130   Lab Results:  Results for orders placed or performed during the hospital encounter of 03/03/15 (from the past 48 hour(s))  Basic metabolic panel     Status: Abnormal   Collection Time: 03/05/15  7:51 PM  Result Value Ref Range   Sodium 142 135 - 145 mmol/L   Potassium 3.7 3.5 - 5.1 mmol/L   Chloride 109 96 - 112 mmol/L   CO2 26 19 - 32 mmol/L   Glucose, Bld 67 (L) 70 - 99 mg/dL   BUN 11 6 - 23 mg/dL    Creatinine, Ser 1.30 0.50 - 1.35 mg/dL   Calcium 8.6 8.4 - 10.5 mg/dL   GFR calc non Af Amer 59 (L) >90 mL/min   GFR calc Af Amer 68 (L) >90 mL/min    Comment: (NOTE) The eGFR has been calculated using the CKD EPI equation. This calculation has not been validated in all clinical situations. eGFR's persistently <90 mL/min signify possible Chronic Kidney Disease.    Anion gap 7 5 - 15    Comment: Performed at [facility] metabolic panel     Status: Abnormal   Collection Time: 03/06/15  6:52 AM  Result Value Ref Range   Sodium 141 135 - 145 mmol/L   Potassium 3.9 3.5 - 5.1 mmol/L   Chloride 110 96 - 112 mmol/L   CO2 24 19 - 32 mmol/L   Glucose, Bld 82 70 - 99 mg/dL   BUN 14 6 - 23 mg/dL   Creatinine, Ser 1.21 0.50 - 1.35 mg/dL   Calcium 8.2 (L) 8.4 - 10.5 mg/dL   GFR calc non Af Amer 64 (L) >90 mL/min   GFR calc Af Amer 74 (L) >90 mL/min    Comment: (NOTE) The eGFR has been calculated using the CKD EPI equation. This calculation has not been validated in all clinical situations. eGFR's persistently <90 mL/min signify possible Chronic Kidney Disease.    Anion gap 7 5 - 15    Comment: Performed at [facility]    Physical Findings: AIMS: Facial and Oral Movements Muscles of Facial Expression: None, normal Lips and Perioral Area: None, normal Jaw: None, normal Tongue: None, normal,Extremity Movements Upper (arms, wrists, hands, fingers): None, normal Lower (legs, knees, ankles, toes): None, normal, Trunk Movements Neck, shoulders, hips: None, normal, Overall Severity Severity of abnormal movements (highest score from questions above): None, normal Incapacitation due to abnormal movements: None, normal Patient's awareness  of abnormal movements (rate only patient's report): No Awareness, Dental Status Current problems with teeth and/or dentures?: No Does patient usually wear dentures?: No  CIWA:  CIWA-Ar Total: 0 COWS:  COWS Total  Score: 1  Treatment Plan Summary: Daily contact with patient to assess and evaluate symptoms and progress in treatment, Medication management and Plan completed ativan detox protocol, continue Tenormin 25 mg daily for HTN/essential tremors, continue trazodone 50 mg for insomnia. Consider discharge tomorrow (3/24), if stable.  Medical Decision Making:  Established Problem, Stable/Improving (1), Review of Psycho-Social Stressors (1), Review or order clinical lab tests (1) and Review of Medication Regimen & Side Effects (2)  Dereck Leep, MD  03/06/2015, 4:55 PM

## 2015-03-07 ENCOUNTER — Emergency Department (HOSPITAL_COMMUNITY)
Admission: EM | Admit: 2015-03-07 | Discharge: 2015-03-07 | Disposition: A | Discharge status: Home / Self Care | Payer: Medicaid Other | Attending: Emergency Medicine | Admitting: Emergency Medicine

## 2015-03-07 ENCOUNTER — Encounter (HOSPITAL_COMMUNITY): Payer: Self-pay | Admitting: Emergency Medicine

## 2015-03-07 DIAGNOSIS — R42 Dizziness and giddiness: Secondary | ICD-10-CM | POA: Diagnosis not present

## 2015-03-07 DIAGNOSIS — G8929 Other chronic pain: Secondary | ICD-10-CM | POA: Insufficient documentation

## 2015-03-07 DIAGNOSIS — Z720 Tobacco use: Secondary | ICD-10-CM | POA: Insufficient documentation

## 2015-03-07 DIAGNOSIS — M6281 Muscle weakness (generalized): Secondary | ICD-10-CM | POA: Insufficient documentation

## 2015-03-07 DIAGNOSIS — K219 Gastro-esophageal reflux disease without esophagitis: Secondary | ICD-10-CM | POA: Insufficient documentation

## 2015-03-07 DIAGNOSIS — Z8679 Personal history of other diseases of the circulatory system: Secondary | ICD-10-CM | POA: Insufficient documentation

## 2015-03-07 DIAGNOSIS — Z8619 Personal history of other infectious and parasitic diseases: Secondary | ICD-10-CM | POA: Insufficient documentation

## 2015-03-07 LAB — CBC
HCT: 35 % — ABNORMAL LOW (ref 39.0–52.0)
Hemoglobin: 11.8 g/dL — ABNORMAL LOW (ref 13.0–17.0)
MCH: 33.5 pg (ref 26.0–34.0)
MCHC: 33.7 g/dL (ref 30.0–36.0)
MCV: 99.4 fL (ref 78.0–100.0)
Platelets: 49 10*3/uL — ABNORMAL LOW (ref 150–400)
RBC: 3.52 MIL/uL — ABNORMAL LOW (ref 4.22–5.81)
RDW: 15.2 % (ref 11.5–15.5)
WBC: 3.4 10*3/uL — ABNORMAL LOW (ref 4.0–10.5)

## 2015-03-07 LAB — BASIC METABOLIC PANEL
Anion gap: 7 (ref 5–15)
BUN: 13 mg/dL (ref 6–23)
CHLORIDE: 107 mmol/L (ref 96–112)
CO2: 25 mmol/L (ref 19–32)
Calcium: 8.7 mg/dL (ref 8.4–10.5)
Creatinine, Ser: 1.12 mg/dL (ref 0.50–1.35)
GFR, EST AFRICAN AMERICAN: 81 mL/min — AB (ref >90)
GFR, EST NON AFRICAN AMERICAN: 70 mL/min — AB (ref >90)
Glucose, Bld: 116 mg/dL — ABNORMAL HIGH (ref 70–99)
Potassium: 3.9 mmol/L (ref 3.5–5.1)
SODIUM: 139 mmol/L (ref 135–145)

## 2015-03-07 LAB — CBG MONITORING, ED
GLUCOSE-CAPILLARY: 81 mg/dL (ref 70–99)
Glucose-Capillary: 129 mg/dL — ABNORMAL HIGH (ref 70–99)

## 2015-03-07 MED ORDER — ATENOLOL 25 MG PO TABS: 25.0000 mg | ORAL_TABLET | Freq: Every day | ORAL | Status: DC

## 2015-03-07 MED ORDER — FAMOTIDINE 20 MG PO TABS: 20.0000 mg | ORAL_TABLET | Freq: Two times a day (BID) | ORAL | Status: DC

## 2015-03-07 MED ORDER — KETOROLAC TROMETHAMINE 60 MG/2ML IM SOLN
30.0000 mg | Freq: Once | INTRAMUSCULAR | Status: AC
  Administered 2015-03-07: 30 mg via INTRAMUSCULAR
  Filled 2015-03-07: qty 2

## 2015-03-07 MED ORDER — PANTOPRAZOLE SODIUM 40 MG PO TBEC: 40.0000 mg | DELAYED_RELEASE_TABLET | Freq: Every day | ORAL | Status: DC

## 2015-03-07 MED ORDER — DICYCLOMINE HCL 10 MG PO CAPS: 10.0000 mg | ORAL_CAPSULE | Freq: Three times a day (TID) | ORAL | Status: DC

## 2015-03-07 MED ORDER — TRAZODONE HCL 50 MG PO TABS: 50.0000 mg | ORAL_TABLET | Freq: Every evening | ORAL | Status: DC | PRN

## 2015-03-07 NOTE — Discharge Summary (Signed)
Physician Discharge Summary Note  Patient:  Noah Medina is an 59 y.o., male MRN:  [id number] DOB:  03/02/1956 Patient phone:  [phone] (home)  Patient address:   [address] [place]  [place]  [address],  Total Time spent with patient: 30 minutes  Date of Admission:  03/03/2015 Date of Discharge: 03/07/2015  Reason for Admission:  Substance abuse  Principal Problem: Alcohol abuse Discharge Diagnoses: Patient Active Problem List   Diagnosis Date Noted  . Homicidal ideation [R45.850] 09/16/2014  . 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 Partial small bowel obstruction [K56.69] 01/31/2014  . Pancytopenia [D61.818] 01/31/2014  . 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 Hepatic encephalopathy [K72.90] 11/18/2013  . Alcohol intoxication [F10.129] 11/18/2013  . Anasarca [R60.1] 11/18/2013  . Tobacco use disorder [Z72.0] 11/18/2013  . Ventral hernia [K43.9] 11/18/2013  . Abdominal  pain, other specified site [R10.84] 08/19/2013  . Syncope [R55] 08/16/2013  . Alcohol abuse [F10.10] 08/16/2013  . GERD (gastroesophageal reflux disease) [K21.9] 08/16/2013  . Nausea and vomiting [R11.2] 08/16/2013  . Transaminitis [R74.0] 08/16/2013  . Subdural hematoma [I62.00] 04/24/2013  . Fall [W19.XXXA] 04/24/2013    Class: Acute  . Thrombocytopenia, unspecified [D69.6] 04/24/2013  . Infected seroma, postoperative [T81.4XXA, T79.2XXA] 07/13/2012  . MRSA (methicillin resistant staph aureus) culture positive [Z22.322] 07/04/2012  . Ascites [R18.8] 07/03/2012  . Normocytic anemia [D64.9] 07/03/2012  . Hyponatremia [E87.1] 07/03/2012  . Hypokalemia [E87.6] 07/02/2012  . Severe malnutrition [E41] 07/02/2012  . Status post repair of ventral hernia [Z98.89] 09/07/2011  . Seroma, postoperative, possible abscess [T88.8XXA, T79.2XXA] 07/20/2011  . 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 Cirrhosis [K74.60] 06/29/2011     Musculoskeletal: Strength & Muscle Tone: within normal limits Gait & Station: normal Patient leans: N/A  Psychiatric Specialty Exam:  SEE SRA Physical Exam  ROS  Blood pressure 140/66, pulse 71, temperature 98 F (36.7 C), temperature source Oral, resp. rate 18, height $RemoveBe'5\' 10"'yAnTBZijv$  (1.778 m), weight 98.884 kg (218 lb).Body mass index is 31.28 kg/(m^2).   Past Medical History:  Past Medical History  Diagnosis Date  . Cirrhosis of liver   . Hepatitis C 03-28-12    ? 80's-prev. IV drug abuse  . 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 Blood transfusion 03-28-12    '81-hx. of bleeding ulcer  . History of bleeding ulcers 03-28-12    Hx. '81  . GERD (gastroesophageal reflux disease) 03-26-12    Hx. reflux. ulcers in past, occ. OTC med  . Incisional hernia     has drainage system from abdomin  . Subdural hematoma 12/13/12    Past Surgical History  Procedure Laterality Date  . Umbilical hernia repair    . Hernia repair  08/24/11    ventral hernia with mesh  . Hernia repair  57'32    Umbilical  . Peptic ulcer  03-26-12    open peptic ulcer surgery repair- '81  . Incisional hernia repair  03/26/2012  . Incisional hernia repair  03/30/2012    Procedure: LAPAROSCOPIC INCISIONAL HERNIA;  Surgeon: Harl Bowie, MD;  Location: [facility];  Service: General;  Laterality: N/A;  Laparoscopic Incisional/ventral Hernia Repair with Mesh  . Tonsillectomy    . Appendectomy    . Incisional hernia repair  07/20/2012    mesh  . Incisional hernia repair  07/20/2012    Procedure: HERNIA REPAIR INCISIONAL;  Surgeon: Harl Bowie, MD;  Location: [facility];  Service: General;  Laterality: N/A;  repair of incisional hernia with Biologic mesh   Family History:  Family History  Problem Relation Age of Onset  . Cancer Mother     breast  . Cancer Father     brain  . Heart disease Brother   . Cancer Sister     breast  . Cancer Sister     breast   Social History:  History   Alcohol Use  . 75.6 oz/week  . 126 Cans of beer per week     History  Drug Use  . Yes  . Special: Cocaine, Marijuana, Methamphetamines, IV    Comment: "somestimes"    History   Social History  . Marital Status: Single    Spouse Name: N/A  . Number of Children: 1  . Years of Education: N/A   Occupational History  . Disabled, former Psychologist, occupational    Social History Main Topics  . Smoking status: Current Every Day Smoker -- 0.25 packs/day for 38 years    Types: Cigarettes  . Smokeless tobacco: Never Used  . Alcohol Use: 75.6 oz/week    126 Cans of beer per week  . Drug Use: Yes    Special: Cocaine, Marijuana, Methamphetamines, IV     Comment: "somestimes"  . Sexual Activity: No   Other Topics Concern  . None   Social History Narrative   Lives alone.  Disabled.  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  1 son.  Independent of ADLs and ambulation.     Risk to Self: Is patient at risk for suicide?: Yes What has been your use of drugs/alcohol within the last 12 months?: Alcohol Risk to Others:   Prior Inpatient Therapy:   Prior Outpatient Therapy:    Level of Care:  OP  Hospital Course:  Noah DEPAZ is an 59 y.o. male who was transported to the ED voluntarily by [employer]. Patient had been drinking and reported that he wanted to kill himself because no one loves him. 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 Patient reports that he is homeless and currently lives in a hotel but plans to kill himself in the woods. Patient reports that he drinks alcohol everyday. Patient reports that he does not have any support in his family and stated "no one cares about me." Patient repeated several times that he was going to kills himself and that no one would stop him. Patient reports that he was "kicked out" of [facility] and is not currently receiving any other services. Patient denies HI and psychosis.   He stated he has been an alcoholic for 30 years. He is on disability. He denies having depression. He was treated at [facility] [place] and afterwards at [facility].  AYUSH BOULET was admitted for Alcohol abuse and crisis management.  He was treated discharged with the medications listed below under Medication List.  Medical problems were identified and treated as needed.  Home medications were restarted as appropriate.  Improvement was monitored by observation and [name] daily report of symptom reduction.  Emotional and mental status was monitored by daily self-inventory reports completed by [name] and clinical staff.         GRIFFON HERBERG was evaluated by the treatment team for stability and plans for continued recovery upon discharge.  JOHNROSS NABOZNY motivation was an integral factor for scheduling further treatment.  Employment, transportation, bed availability, health status, family support, and any pending legal issues were also considered during his hospital stay.  He was offered further treatment options upon discharge including but not limited to Residential, Intensive Outpatient, and Outpatient treatment.  ABDIEL BLACKERBY will follow up with the services as listed below under Follow Up Information.     Upon completion of this admission the patient was both mentally and medically stable for discharge denying suicidal/homicidal ideation, auditory/visual/tactile hallucinations, delusional thoughts and paranoia.      Consults:  psychiatry  Significant Diagnostic Studies:  labs: per ED  Discharge Vitals:   Blood pressure 140/66, pulse 71, temperature 98 F (36.7 C), temperature source Oral, resp. rate 18, height $RemoveBe'5\' 10"'uMYqKqmnr$  (1.778 m), weight 98.884 kg (218 lb). Body mass index is 31.28 kg/(m^2). Lab Results:   Results for orders placed or performed during the hospital encounter of 03/03/15 (from the past 72 hour(s))  Basic metabolic panel     Status: Abnormal    Collection Time: 03/05/15  7:51 PM  Result Value Ref Range   Sodium 142 135 - 145 mmol/L   Potassium 3.7 3.5 - 5.1 mmol/L   Chloride 109 96 - 112 mmol/L   CO2 26 19 - 32 mmol/L   Glucose, Bld 67 (L) 70 - 99 mg/dL   BUN 11 6 - 23 mg/dL   Creatinine, Ser 1.30 0.50 - 1.35 mg/dL   Calcium 8.6 8.4 - 10.5 mg/dL   GFR calc non Af Amer 59 (L) >90 mL/min   GFR calc Af Amer 68 (L) >90 mL/min    Comment: (NOTE) The eGFR has been calculated using the CKD EPI equation. This calculation has not been validated in all clinical situations. eGFR's persistently <90 mL/min signify possible Chronic Kidney Disease.    Anion gap 7 5 - 15    Comment: Performed at [facility] metabolic panel     Status: Abnormal   Collection Time: 03/06/15  6:52 AM  Result Value Ref Range   Sodium 141 135 - 145 mmol/L   Potassium 3.9 3.5 - 5.1 mmol/L   Chloride 110 96 - 112 mmol/L   CO2 24 19 - 32 mmol/L   Glucose, Bld 82 70 - 99 mg/dL   BUN 14 6 - 23 mg/dL   Creatinine, Ser 1.21 0.50 - 1.35 mg/dL   Calcium 8.2 (L) 8.4 - 10.5 mg/dL   GFR calc non Af Amer 64 (L) >90 mL/min   GFR calc Af Amer 74 (L) >90 mL/min    Comment: (NOTE) The eGFR has been calculated using the CKD EPI equation. This calculation has not been validated in all clinical situations. eGFR's persistently <90 mL/min signify possible Chronic Kidney Disease.    Anion gap 7 5 - 15    Comment: Performed at [facility]    Physical Findings: AIMS: Facial and Oral Movements Muscles of Facial Expression: None, normal Lips and Perioral Area: None, normal Jaw: None, normal Tongue: None, normal,Extremity Movements Upper (arms, wrists, hands, fingers): None, normal Lower (legs, knees, ankles, toes): None, normal, Trunk Movements Neck, shoulders, hips: None, normal, Overall Severity Severity of abnormal movements (highest score from questions above): None, normal Incapacitation due to abnormal movements: None,  normal Patient's awareness of abnormal movements (rate only patient's report): No Awareness, Dental Status Current problems with teeth and/or dentures?: No Does patient usually wear dentures?: No  CIWA:  CIWA-Ar Total: 0 COWS:  COWS Total Score: 1   See  Psychiatric Specialty Exam and Suicide Risk Assessment completed by Attending Physician prior to discharge.  Discharge destination:  Home  Is patient on multiple antipsychotic therapies at discharge:  No   Has Patient had three or more failed trials of antipsychotic monotherapy by history:  No    Recommended Plan for Multiple Antipsychotic Therapies: NA     Medication List    STOP taking these medications        multivitamin with minerals Tabs tablet     omeprazole 20 MG tablet  Commonly known as:  PRILOSEC OTC      TAKE these medications      Indication   atenolol 25 MG tablet  Commonly known as:  TENORMIN  Take 1 tablet (25 mg total) by mouth daily.   Indication:  Fine to Coarse Slow Tremor affecting Head, Hands & Voice, High Blood Pressure     dicyclomine 10 MG capsule  Commonly known as:  BENTYL  Take 1 capsule (10 mg total) by mouth 4 (four) times daily -  before meals and at bedtime.   Indication:  Irritable Bowel Syndrome     famotidine 20 MG tablet  Commonly known as:  PEPCID  Take 1 tablet (20 mg total) by mouth 2 (two) times daily.   Indication:  Gastroesophageal Reflux Disease     pantoprazole 40 MG tablet  Commonly known as:  PROTONIX  Take 1 tablet (40 mg total) by mouth daily.   Indication:  Gastroesophageal Reflux Disease     traZODone 50 MG tablet  Commonly known as:  DESYREL  Take 1 tablet (50 mg total) by mouth at bedtime as needed for sleep.   Indication:  Major Depressive Disorder           Follow-up Information    [facility] with [facility].   Why:  You are currently 4th-5th on the waitlist. Please call on Tuesday, 2/29 to check status of waitlist if you have not  already been admitted.    Contact information:   [facility] [place]  [address] Telephone:  [phone]      Follow up with [facility].   Why:  Please call office to schedule hospital follow-up if necesary prior to admission to [facility].    Contact information:   ATTN: Eldridge Abrahams, FNR-C [address] [place], [place] [address] Phone: [phone] Fax: [phone]      Follow-up recommendations:  Activity:  as tol, diet as tol  Comments:  1.  Take all your medications as prescribed.              2.  Report any adverse side effects to outpatient provider.                       3.  Patient instructed to not use alcohol or illegal drugs while on prescription medicines.            4.  In the event of worsening symptoms, instructed patient to call 911, the crisis hotline or go to nearest emergency room for evaluation of symptoms.  Total Discharge Time:  30 min  Signed: Kerrie Buffalo MAY, AGNP-BC 03/07/2015, 11:25 AM  Patient seen face-to-face for psychiatric evaluation, chart reviewed and case discussed with the physician extender and developed treatment plan. Reviewed the information documented and agree with the treatment plan. Corena Pilgrim, MD

## 2015-03-07 NOTE — BHH Suicide Risk Assessment (Signed)
[facility] Discharge Suicide Risk Assessment   Demographic Factors:  Male, Caucasian, Low socioeconomic status, Unemployed and on disability  Total Time spent with patient: 30 minutes  Musculoskeletal: Strength & Muscle Tone: within normal limits Gait & Station: normal Patient leans: N/A  Psychiatric Specialty Exam: Physical Exam  Psychiatric: He has a normal mood and affect. His speech is normal and behavior is normal. Judgment and thought content normal. Cognition and memory are normal.    Review of Systems  Constitutional: Negative.   HENT: Negative.   Respiratory: Negative.   Cardiovascular: Negative.   Gastrointestinal: Negative.   Genitourinary: Negative.   Musculoskeletal: Negative.   Skin: Negative.   Neurological: Negative.   Psychiatric/Behavioral: Negative.     Blood pressure 140/66, pulse 71, temperature 98 F (36.7 C), temperature source Oral, resp. rate 18, height 5\' 10"  (1.778 m), weight 98.884 kg (218 lb).Body mass index is 31.28 kg/(m^2).  General Appearance: Casual  Eye Contact::  Good  Speech:  Clear and Coherent409  Volume:  Normal  Mood:  Euthymic  Affect:  Appropriate  Thought Process:  Goal Directed  Orientation:  Full (Time, Place, and Person)  Thought Content:  Negative  Suicidal Thoughts:  No  Homicidal Thoughts:  No  Memory:  Immediate;   Good Recent;   Good Remote;   Good  Judgement:  Fair  Insight:  Fair  Psychomotor Activity:  Normal  Concentration:  Fair  Recall:  Good  Fund of Knowledge:Good  Language: Good  Akathisia:  No  Handed:  Right  AIMS (if indicated):     Assets:  Communication Skills Desire for Improvement Physical Health  Sleep:  Number of Hours: 5.75  Cognition: WNL  ADL's:  Intact      Has this patient used any form of tobacco in the last 30 days? (Cigarettes, Smokeless Tobacco, Cigars, and/or Pipes) No  Mental Status Per Nursing Assessment::   On Admission:     Current Mental Status by Physician: patient  denies suicidal ideation, intent or plan  Loss Factors: Financial problems/change in socioeconomic status  Historical Factors: NA  Risk Reduction Factors:   Living with another person, especially a relative and Positive social support  Continued Clinical Symptoms:  Alcohol/Substance Abuse/Dependencies  Cognitive Features That Contribute To Risk:  Closed-mindedness    Suicide Risk:  Minimal: No identifiable suicidal ideation.  Patients presenting with no risk factors but with morbid ruminations; may be classified as minimal risk based on the severity of the depressive symptoms  Principal Problem: Alcohol abuse Discharge Diagnoses:  Patient Active Problem List   Diagnosis Date Noted  . Homicidal ideation [R45.850] 09/16/2014  . 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 Partial small bowel obstruction [K56.69] 01/31/2014  . Pancytopenia [D61.818] 01/31/2014  . 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 Hepatic encephalopathy [K72.90] 11/18/2013  . Alcohol intoxication [F10.129] 11/18/2013  . Anasarca [R60.1] 11/18/2013  . Tobacco use disorder [Z72.0] 11/18/2013  . Ventral hernia [K43.9] 11/18/2013  . Abdominal  pain, other specified site [R10.84] 08/19/2013  . Syncope [R55] 08/16/2013  . Alcohol abuse [F10.10] 08/16/2013  . GERD (gastroesophageal reflux disease) [K21.9] 08/16/2013  . Nausea and vomiting [R11.2] 08/16/2013  . Transaminitis [R74.0] 08/16/2013  . Subdural hematoma [I62.00] 04/24/2013  . Fall [W19.XXXA] 04/24/2013    Class: Acute  . Thrombocytopenia, unspecified [D69.6] 04/24/2013  . Infected seroma, postoperative [T81.4XXA, T79.2XXA] 07/13/2012  . MRSA (methicillin resistant staph aureus) culture positive [Z22.322] 07/04/2012  . Ascites [R18.8] 07/03/2012  . Normocytic anemia [D64.9]  07/03/2012  . Hyponatremia [E87.1] 07/03/2012  . Hypokalemia [E87.6] 07/02/2012  . Severe malnutrition [E41] 07/02/2012  . Status post repair of  ventral hernia [Z98.89] 09/07/2011  . Seroma, postoperative, possible abscess [T88.8XXA, T79.2XXA] 07/20/2011  . 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 Cirrhosis [K74.60] 06/29/2011    Follow-up Information    [facility]   Why:  When bed is available   Contact information:   [address] [place]  [address] Telephone:  [phone]      Plan Of Care/Follow-up recommendations:  Activity:  as tolerated Diet:  healthy  Is patient on multiple antipsychotic therapies at discharge:  No   Has Patient had three or more failed trials of antipsychotic monotherapy by history:  No  Recommended Plan for Multiple Antipsychotic Therapies: NA   Corena Pilgrim, MD 03/07/2015, 9:52 AM

## 2015-03-07 NOTE — Progress Notes (Signed)
  [facility] Adult Case Management Discharge Plan :  Will you be returning to the same living situation after discharge:  Yes,  return home until admission into [facility] At discharge, do you have transportation home?: Yes,  bus pass provided to pt by CSW this morning.  Do you have the ability to pay for your medications: Yes,  [place] medicaid  Release of information consent forms completed and submitted to Medical Records by CSW.  Patient to Follow up at: Follow-up Information    [facility] with [facility].   Why:  You are currently 4th-5th on the waitlist. Please call on Tuesday, 2/29 to check status of waitlist if you have not already been admitted.    Contact information:   [address] [place]  [address] Telephone:  [phone]      Follow up with [facility].   Why:  Please call office to schedule hospital follow-up if necesary prior to admission to [facility].    Contact information:   ATTN: Eldridge Abrahams, FNR-C [address] [place], [place] [address] Phone: [phone] Fax: [phone]      Patient denies SI/HI: Yes,  during group/self report.     Safety Planning and Suicide Prevention discussed: Yes,  Pt refused to consent to family contact. SPE completed with pt and he was provided with SPI pamphlet, encouraged to share information with support network, ask questions, and talk about any concerns relating to SPE.     Has patient been referred to the Quitline?: Patient refused referral  Smart, Alicia Amel  03/07/2015, 10:31 AM

## 2015-03-07 NOTE — Progress Notes (Signed)
Pt attended group 

## 2015-03-07 NOTE — Progress Notes (Signed)
D: Patient is alert and oriented. Pt's mood and affect is depressed and blunted. Pt denies SI/HI and AVH. Pt reports he is ready to go home today. Pt rates depression, hopelessness, and anxiety 0/10. Pt is attending unit groups. Pt has +1 mild pitting edema in bilateral lower extremities. Pt has "hernia"- abdomen extended. A: Pt encouraged to elevate legs throughout the day to help reduce swelling. Encouragement/Support provided to pt. Active listening by RN. Scheduled medications administered per providers orders (See MAR). 15 minute checks continued per protocol for patient safety.  R: Patient cooperative and receptive to nursing interventions. Pt remains safe.

## 2015-03-07 NOTE — BHH Group Notes (Signed)
[facility] Group Notes:  (Nursing/MHT/Case Management/Adjunct)  Date:  03/07/2015  Time:  0900am  Type of Therapy:  Nurse Education  Participation Level:  Active  Participation Quality:  Appropriate and Attentive  Affect:  Appropriate  Cognitive:  Appropriate  Insight:  Appropriate  Engagement in Group:  Engaged  Modes of Intervention:  Discussion and Support  Summary of Progress/Problems: Patient attended group, remained engaged, and responded appropriately when prompted.  Noah Medina A 03/07/2015, 10:29 AM

## 2015-03-07 NOTE — Discharge Instructions (Signed)
As discussed, your evaluation today has been largely reassuring.  But, it is important that you monitor your condition carefully, and do not hesitate to return to the ED if you develop new, or concerning changes in your condition. ? ?Otherwise, please follow-up with your physician for appropriate ongoing care. ? ?

## 2015-03-07 NOTE — ED Provider Notes (Signed)
CSN: [id number]     Arrival date & time 03/07/15  1439 History   First MD Initiated Contact with Patient 03/07/15 1658     Chief Complaint  Patient presents with  . Weakness  . Abdominal Pain    Chronic hernia, visible hernia  . Back Pain     (Consider location/radiation/quality/duration/timing/severity/associated sxs/prior Treatment) HPI Patient presents same day as discharge from our affiliated behavioral health facility, now with concern of leg weakness, lightheadedness. Patient states that symptoms of been present for several weeks, including throughout his hospitalization. No change today, but while walking to the.  He felt weak in the legs. No syncope, falling, trauma, new pain anywhere. Patient acknowledges chronic pain. Patient has not followed up with primary care yet. Patient denies suicidal thoughts currently.   Past Medical History  Diagnosis Date  . Cirrhosis of liver   . Hepatitis C 03-28-12    ? 80's-prev. IV drug abuse  . 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 Blood transfusion 03-28-12    '81-hx. of bleeding ulcer  . History of bleeding ulcers 03-28-12    Hx. '81  . GERD (gastroesophageal reflux disease) 03-26-12    Hx. reflux. ulcers in past, occ. OTC med  . Incisional hernia     has drainage system from abdomin  . Subdural hematoma 12/13/12   Past Surgical History  Procedure Laterality Date  . Umbilical hernia repair    . Hernia repair  08/24/11    ventral hernia with mesh  . Hernia repair  77'93    Umbilical  . Peptic ulcer  03-26-12    open peptic ulcer surgery repair- '81  . Incisional hernia repair  03/26/2012  . Incisional hernia repair  03/30/2012    Procedure: LAPAROSCOPIC INCISIONAL HERNIA;  Surgeon: Harl Bowie, MD;  Location: [facility];  Service: General;  Laterality: N/A;  Laparoscopic Incisional/ventral Hernia Repair with Mesh  . Tonsillectomy    . Appendectomy    . Incisional hernia repair  07/20/2012    mesh    . Incisional hernia repair  07/20/2012    Procedure: HERNIA REPAIR INCISIONAL;  Surgeon: Harl Bowie, MD;  Location: [facility];  Service: General;  Laterality: N/A;  repair of incisional hernia with Biologic mesh   Family History  Problem Relation Age of Onset  . Cancer Mother     breast  . Cancer Father     brain  . Heart disease Brother   . Cancer Sister     breast  . Cancer Sister     breast   History  Substance Use Topics  . Smoking status: Current Every Day Smoker -- 0.25 packs/day for 38 years    Types: Cigarettes  . Smokeless tobacco: Never Used  . Alcohol Use: 75.6 oz/week    126 Cans of beer per week    Review of Systems  Constitutional:       Per HPI, otherwise negative  HENT:       Per HPI, otherwise negative  Respiratory:       Per HPI, otherwise negative  Cardiovascular:       Per HPI, otherwise negative  Gastrointestinal: Negative for nausea and vomiting.       Patient with multiple hernia throughout his abdomen, no change in pain, change in appetite.  Endocrine:       Negative aside from HPI  Genitourinary:       Neg aside from HPI   Musculoskeletal:  Per HPI, otherwise negative  Skin: Negative.   Neurological: Positive for weakness and light-headedness. Negative for syncope.      Allergies  Review of patient's allergies indicates no known allergies.  Home Medications   Prior to Admission medications   Medication Sig Start Date End Date Taking? Authorizing Provider  atenolol (TENORMIN) 25 MG tablet Take 1 tablet (25 mg total) by mouth daily. 03/07/15  Yes Kerrie Buffalo, NP  dicyclomine (BENTYL) 10 MG capsule Take 1 capsule (10 mg total) by mouth 4 (four) times daily -  before meals and at bedtime. 03/07/15  Yes Kerrie Buffalo, NP  famotidine (PEPCID) 20 MG tablet Take 1 tablet (20 mg total) by mouth 2 (two) times daily. 03/07/15  Yes Kerrie Buffalo, NP  Multiple Vitamins-Minerals (MULTIVITAMIN ADULT PO) Take 1 tablet by mouth daily.   Yes  Historical Provider, MD  pantoprazole (PROTONIX) 40 MG tablet Take 1 tablet (40 mg total) by mouth daily. 03/07/15  Yes Kerrie Buffalo, NP  traZODone (DESYREL) 50 MG tablet Take 1 tablet (50 mg total) by mouth at bedtime as needed for sleep. 03/07/15  Yes Kerrie Buffalo, NP   BP 158/64 mmHg  Pulse 66  Temp(Src) 97.9 F (36.6 C) (Oral)  Resp 18  SpO2 100% Physical Exam  Constitutional: He is oriented to person, place, and time. He appears well-developed. No distress.  Comfortable appearing male resting in the gurney.   HENT:  Head: Normocephalic and atraumatic.  Eyes: Conjunctivae and EOM are normal.  Cardiovascular: Normal rate and regular rhythm.   Pulmonary/Chest: Effort normal. No stridor. No respiratory distress.  Abdominal: He exhibits no distension. There is no tenderness.    Musculoskeletal: He exhibits no edema.  Neurological: He is alert and oriented to person, place, and time.  Skin: Skin is warm and dry.  Psychiatric: He has a normal mood and affect. Thought content is not paranoid and not delusional. Cognition and memory are not impaired. He expresses no suicidal ideation. He expresses no suicidal plans.  Nursing note and vitals reviewed.   ED Course  Procedures (including critical care time) Labs Review Labs Reviewed  CBC - Abnormal; Notable for the following:    WBC 3.4 (*)    RBC 3.52 (*)    Hemoglobin 11.8 (*)    HCT 35.0 (*)    Platelets 49 (*)    All other components within normal limits  BASIC METABOLIC PANEL - Abnormal; Notable for the following:    Glucose, Bld 116 (*)    GFR calc non Af Amer 70 (*)    GFR calc Af Amer 81 (*)    All other components within normal limits  CBG MONITORING, ED      EKG Interpretation   Date/Time:  Thursday March 07 2015 14:57:07 EDT Ventricular Rate:  67 PR Interval:  160 QRS Duration: 100 QT Interval:  418 QTC Calculation: 441 R Axis:   54 Text Interpretation:  Normal sinus rhythm Normal ECG Sinus rhythm  Normal  ECG Confirmed by Carmin Muskrat  MD [phone]) on 03/07/2015 5:03:02 PM     After the initial evaluation I reviewed the patient's chart, including discharge summary from earlier today. No evidence for ongoing suicidal ideation.  MDM  Patient presents saying days discharged from our behavioral health facility with ongoing concerns for chronic lightheadedness, leg weakness.  Here the patient is neurologically intact, hemodynamically stable, in no distress. Patient requests pain medication for his chronic pain. Patient's labs unremarkable, vital signs are unremarkable, and given his  recent hospitalization, with no decompensation during days of monitoring, there is low suspicion for acute new pathology. Patient was appropriate for discharge with outpatient follow-up.    Carmin Muskrat, MD 03/07/15 1736

## 2015-03-07 NOTE — ED Notes (Signed)
At bus stop, felt increasing weakness over the last few days after being discharged from behavioral health. Having near syncopal episodes throughout today, EMS states they're recurrent from pt history. Pt states his stomach hernias and back have been causing him more pain over the last couple months.

## 2015-03-07 NOTE — ED Notes (Signed)
Bed: [facility] Expected date:  Expected time:  Means of arrival:  Comments: Pt in room 

## 2015-03-07 NOTE — ED Notes (Signed)
Bed: [facility] Expected date:  Expected time:  Means of arrival:  Comments: ems 

## 2015-03-07 NOTE — ED Notes (Signed)
Questions, concerns denied pt ambulatory and a&ox4 upon dc

## 2015-03-07 NOTE — Progress Notes (Signed)
DISCHARGE NOTE: D: Patient was alert, oriented, in stable condition, and ambulatory with a steady gait upon discharge. Pt denies SI/HI and AVH. A: AVS reviewed and given to pt. Follow up reviewed with pt. Resources reviewed with pt, including [facility]. Prescriptions/Medications given to pt. Belongings returned to pt. Pt given time to ask questions and express concerns. Pt encouraged to follow up with PCP regarding bilateral lower extremity edema. R: Pt D/C'd with bus pass.

## 2015-03-07 NOTE — ED Notes (Signed)
Bed: [facility] Expected date:  Expected time:  Means of arrival:  Comments: EMS 

## 2015-03-08 ENCOUNTER — Emergency Department (HOSPITAL_COMMUNITY): Payer: Medicaid Other

## 2015-03-08 ENCOUNTER — Emergency Department (INDEPENDENT_AMBULATORY_CARE_PROVIDER_SITE_OTHER)
Admission: EM | Admit: 2015-03-08 | Discharge: 2015-03-08 | Disposition: A | Discharge status: Home / Self Care | Payer: Medicaid Other | Source: Home / Self Care | Attending: Family Medicine | Admitting: Family Medicine

## 2015-03-08 ENCOUNTER — Emergency Department (HOSPITAL_COMMUNITY)
Admission: EM | Admit: 2015-03-08 | Discharge: 2015-03-08 | Disposition: A | Discharge status: Home / Self Care | Payer: Medicaid Other | Attending: Emergency Medicine | Admitting: Emergency Medicine

## 2015-03-08 ENCOUNTER — Encounter (HOSPITAL_COMMUNITY): Payer: Self-pay | Admitting: Emergency Medicine

## 2015-03-08 DIAGNOSIS — Z8679 Personal history of other diseases of the circulatory system: Secondary | ICD-10-CM | POA: Diagnosis not present

## 2015-03-08 DIAGNOSIS — R1084 Generalized abdominal pain: Secondary | ICD-10-CM | POA: Diagnosis not present

## 2015-03-08 DIAGNOSIS — R109 Unspecified abdominal pain: Secondary | ICD-10-CM | POA: Insufficient documentation

## 2015-03-08 DIAGNOSIS — Z8619 Personal history of other infectious and parasitic diseases: Secondary | ICD-10-CM | POA: Insufficient documentation

## 2015-03-08 DIAGNOSIS — Z720 Tobacco use: Secondary | ICD-10-CM | POA: Insufficient documentation

## 2015-03-08 DIAGNOSIS — F1010 Alcohol abuse, uncomplicated: Secondary | ICD-10-CM | POA: Diagnosis not present

## 2015-03-08 DIAGNOSIS — G8929 Other chronic pain: Secondary | ICD-10-CM | POA: Diagnosis not present

## 2015-03-08 DIAGNOSIS — M549 Dorsalgia, unspecified: Secondary | ICD-10-CM | POA: Diagnosis not present

## 2015-03-08 DIAGNOSIS — R079 Chest pain, unspecified: Secondary | ICD-10-CM | POA: Insufficient documentation

## 2015-03-08 DIAGNOSIS — K219 Gastro-esophageal reflux disease without esophagitis: Secondary | ICD-10-CM | POA: Diagnosis not present

## 2015-03-08 DIAGNOSIS — R55 Syncope and collapse: Secondary | ICD-10-CM | POA: Diagnosis not present

## 2015-03-08 LAB — COMPREHENSIVE METABOLIC PANEL
ALK PHOS: 53 U/L (ref 39–117)
ALT: 33 U/L (ref 0–53)
ANION GAP: 7 (ref 5–15)
AST: 58 U/L — ABNORMAL HIGH (ref 0–37)
Albumin: 2.9 g/dL — ABNORMAL LOW (ref 3.5–5.2)
BILIRUBIN TOTAL: 2.7 mg/dL — AB (ref 0.3–1.2)
BUN: 12 mg/dL (ref 6–23)
CHLORIDE: 107 mmol/L (ref 96–112)
CO2: 22 mmol/L (ref 19–32)
Calcium: 8.5 mg/dL (ref 8.4–10.5)
Creatinine, Ser: 1.28 mg/dL (ref 0.50–1.35)
GFR, EST AFRICAN AMERICAN: 69 mL/min — AB (ref >90)
GFR, EST NON AFRICAN AMERICAN: 60 mL/min — AB (ref >90)
GLUCOSE: 123 mg/dL — AB (ref 70–99)
POTASSIUM: 3.6 mmol/L (ref 3.5–5.1)
Sodium: 136 mmol/L (ref 135–145)
Total Protein: 6.4 g/dL (ref 6.0–8.3)

## 2015-03-08 LAB — I-STAT CHEM 8, ED
BUN: 13 mg/dL (ref 6–23)
Calcium, Ion: 1.16 mmol/L (ref 1.12–1.23)
Chloride: 103 mmol/L (ref 96–112)
Creatinine, Ser: 1.4 mg/dL — ABNORMAL HIGH (ref 0.50–1.35)
GLUCOSE: 121 mg/dL — AB (ref 70–99)
HCT: 35 % — ABNORMAL LOW (ref 39.0–52.0)
HEMOGLOBIN: 11.9 g/dL — AB (ref 13.0–17.0)
Potassium: 3.7 mmol/L (ref 3.5–5.1)
SODIUM: 140 mmol/L (ref 135–145)
TCO2: 18 mmol/L (ref 0–100)

## 2015-03-08 LAB — CBC WITH DIFFERENTIAL/PLATELET
BASOS ABS: 0 10*3/uL (ref 0.0–0.1)
BASOS PCT: 0 % (ref 0–1)
EOS ABS: 0.1 10*3/uL (ref 0.0–0.7)
Eosinophils Relative: 2 % (ref 0–5)
HCT: 32.7 % — ABNORMAL LOW (ref 39.0–52.0)
Hemoglobin: 11.3 g/dL — ABNORMAL LOW (ref 13.0–17.0)
Lymphocytes Relative: 19 % (ref 12–46)
Lymphs Abs: 1.1 10*3/uL (ref 0.7–4.0)
MCH: 33.6 pg (ref 26.0–34.0)
MCHC: 34.6 g/dL (ref 30.0–36.0)
MCV: 97.3 fL (ref 78.0–100.0)
Monocytes Absolute: 0.7 10*3/uL (ref 0.1–1.0)
Monocytes Relative: 12 % (ref 3–12)
NEUTROS PCT: 68 % (ref 43–77)
Neutro Abs: 3.8 10*3/uL (ref 1.7–7.7)
PLATELETS: 39 10*3/uL — AB (ref 150–400)
RBC: 3.36 MIL/uL — ABNORMAL LOW (ref 4.22–5.81)
RDW: 15.1 % (ref 11.5–15.5)
WBC: 5.6 10*3/uL (ref 4.0–10.5)

## 2015-03-08 LAB — TROPONIN I: Troponin I: <0.03 ng/mL (ref <0.031)

## 2015-03-08 LAB — ETHANOL: Alcohol, Ethyl (B): 149 mg/dL — ABNORMAL HIGH (ref 0–9)

## 2015-03-08 MED ORDER — DICYCLOMINE HCL 10 MG PO CAPS: 10.0000 mg | ORAL_CAPSULE | Freq: Three times a day (TID) | ORAL | Status: DC

## 2015-03-08 MED ORDER — DICYCLOMINE HCL 10 MG PO CAPS
10.0000 mg | ORAL_CAPSULE | Freq: Once | ORAL | Status: AC
  Administered 2015-03-08: 10 mg via ORAL
  Filled 2015-03-08: qty 1

## 2015-03-08 NOTE — Discharge Instructions (Signed)
Abdominal Pain Many things can cause abdominal pain. Usually, abdominal pain is not caused by a disease and will improve without treatment. It can often be observed and treated at home. Your health care provider will do a physical exam and possibly order blood tests and X-rays to help determine the seriousness of your pain. However, in many cases, more time must pass before a clear cause of the pain can be found. Before that point, your health care provider may not know if you need more testing or further treatment. HOME CARE INSTRUCTIONS  Monitor your abdominal pain for any changes. The following actions may help to alleviate any discomfort you are experiencing:  Only take over-the-counter or prescription medicines as directed by your health care provider.  Do not take laxatives unless directed to do so by your health care provider.  Try a clear liquid diet (broth, tea, or water) as directed by your health care provider. Slowly move to a bland diet as tolerated. SEEK MEDICAL CARE IF:  You have unexplained abdominal pain.  You have abdominal pain associated with nausea or diarrhea.  You have pain when you urinate or have a bowel movement.  You experience abdominal pain that wakes you in the night.  You have abdominal pain that is worsened or improved by eating food.  You have abdominal pain that is worsened with eating fatty foods.  You have a fever. SEEK IMMEDIATE MEDICAL CARE IF:   Your pain does not go away within 2 hours.  You keep throwing up (vomiting).  Your pain is felt only in portions of the abdomen, such as the right side or the left lower portion of the abdomen.  You pass bloody or black tarry stools. MAKE SURE YOU:  Understand these instructions.   Will watch your condition.   Will get help right away if you are not doing well or get worse.  Document Released: 09/09/2005 Document Revised: 12/05/2013 Document Reviewed: 08/09/2013 [facility] Patient Information  2015 [place], [place]. This information is not intended to replace advice given to you by your health care provider. Make sure you discuss any questions you have with your health care provider.  Alcohol Intoxication Alcohol intoxication occurs when the amount of alcohol that a person has consumed impairs his or her ability to mentally and physically function. Alcohol directly impairs the normal chemical activity of the brain. Drinking large amounts of alcohol can lead to changes in mental function and behavior, and it can cause many physical effects that can be harmful.  Alcohol intoxication can range in severity from mild to very severe. Various factors can affect the level of intoxication that occurs, such as the person's age, gender, weight, frequency of alcohol consumption, and the presence of other medical conditions (such as diabetes, seizures, or heart conditions). Dangerous levels of alcohol intoxication may occur when people drink large amounts of alcohol in a short period (binge drinking). Alcohol can also be especially dangerous when combined with certain prescription medicines or "recreational" drugs. SIGNS AND SYMPTOMS Some common signs and symptoms of mild alcohol intoxication include:  Loss of coordination.  Changes in mood and behavior.  Impaired judgment.  Slurred speech. As alcohol intoxication progresses to more severe levels, other signs and symptoms will appear. These may include:  Vomiting.  Confusion and impaired memory.  Slowed breathing.  Seizures.  Loss of consciousness. DIAGNOSIS  Your health care provider will take a medical history and perform a physical exam. You will be asked about the amount and type  of alcohol you have consumed. Blood tests will be done to measure the concentration of alcohol in your blood. In many places, your blood alcohol level must be lower than 80 mg/dL (0.08%) to legally drive. However, many dangerous effects of alcohol can occur at  much lower levels.  TREATMENT  People with alcohol intoxication often do not require treatment. Most of the effects of alcohol intoxication are temporary, and they go away as the alcohol naturally leaves the body. Your health care provider will monitor your condition until you are stable enough to go home. Fluids are sometimes given through an IV access tube to help prevent dehydration.  HOME CARE INSTRUCTIONS  Do not drive after drinking alcohol.  Stay hydrated. Drink enough water and fluids to keep your urine clear or pale yellow. Avoid caffeine.   Only take over-the-counter or prescription medicines as directed by your health care provider.  SEEK MEDICAL CARE IF:   You have persistent vomiting.   You do not feel better after a few days.  You have frequent alcohol intoxication. Your health care provider can help determine if you should see a substance use treatment counselor. SEEK IMMEDIATE MEDICAL CARE IF:   You become shaky or tremble when you try to stop drinking.   You shake uncontrollably (seizure).   You throw up (vomit) blood. This may be bright red or may look like black coffee grounds.   You have blood in your stool. This may be bright red or may appear as a black, tarry, bad smelling stool.   You become lightheaded or faint.  MAKE SURE YOU:   Understand these instructions.  Will watch your condition.  Will get help right away if you are not doing well or get worse. Document Released: 09/09/2005 Document Revised: 08/02/2013 Document Reviewed: 05/05/2013 [facility] Patient Information 2015 [place], [place]. This information is not intended to replace advice given to you by your health care provider. Make sure you discuss any questions you have with your health care provider.

## 2015-03-08 NOTE — ED Notes (Signed)
Patient transported to X-ray 

## 2015-03-08 NOTE — ED Provider Notes (Signed)
CSN: [id number]     Arrival date & time 03/08/15  0038 History  This chart was scribed for Noah Hacker, MD by [name], ED Scribe. This patient was seen in room D35C/D35C and the patient's care was started at 1:33 AM.    Chief Complaint  Patient presents with  . Abdominal Pain  . Loss of Consciousness   Patient is a 59 y.o. male presenting with abdominal pain and syncope. The history is provided by the patient. No language interpreter was used.  Abdominal Pain Associated symptoms: chest pain   Associated symptoms: no diarrhea, no dysuria, no fever, no nausea, no shortness of breath and no vomiting   Loss of Consciousness Associated symptoms: chest pain   Associated symptoms: no fever, no headaches, no nausea, no shortness of breath and no vomiting      HPI Comments: Noah Medina is a 60 y.o. male with past medical history of cirrhosis of liver, Hep C, occasional tremors, bleeding ulcers with blood transfusion, GERD, incisional hernia, subdural hematoma who presents to the Emergency Department via EMS complaining of a syncopal episode tonight, lasting just over a minute, with associated abdominal pain. Patient states that "I passed all the time." He sometimes has chest pain. He also complains of ongoing abdominal pain with nausea and diarrhea. He also notes two months of back pain, foot pain and lower extremity swelling. He denies vomiting.   Per EMS, patient was released from [facility] today and is homeless. He reports occasional EtOH use; he admits to "drinking a few" (per EMS, six beers) today.   Past Medical History  Diagnosis Date  . Cirrhosis of liver   . Hepatitis C 03-28-12    ? 80's-prev. IV drug abuse  . 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 Blood transfusion 03-28-12    '81-hx. of bleeding ulcer  . History of bleeding ulcers 03-28-12    Hx. '81  . GERD (gastroesophageal reflux disease) 03-26-12    Hx. reflux. ulcers in past, occ. OTC med  .  Incisional hernia     has drainage system from abdomin  . Subdural hematoma 12/13/12   Past Surgical History  Procedure Laterality Date  . Umbilical hernia repair    . Hernia repair  08/24/11    ventral hernia with mesh  . Hernia repair  27'06    Umbilical  . Peptic ulcer  03-26-12    open peptic ulcer surgery repair- '81  . Incisional hernia repair  03/26/2012  . Incisional hernia repair  03/30/2012    Procedure: LAPAROSCOPIC INCISIONAL HERNIA;  Surgeon: Harl Bowie, MD;  Location: [facility];  Service: General;  Laterality: N/A;  Laparoscopic Incisional/ventral Hernia Repair with Mesh  . Tonsillectomy    . Appendectomy    . Incisional hernia repair  07/20/2012    mesh  . Incisional hernia repair  07/20/2012    Procedure: HERNIA REPAIR INCISIONAL;  Surgeon: Harl Bowie, MD;  Location: [place];  Service: General;  Laterality: N/A;  repair of incisional hernia with Biologic mesh   Family History  Problem Relation Age of Onset  . Cancer Mother     breast  . Cancer Father     brain  . Heart disease Brother   . Cancer Sister     breast  . Cancer Sister     breast   History  Substance Use Topics  . Smoking status: Current Every Day Smoker -- 0.25 packs/day for 38 years  Types: Cigarettes  . Smokeless tobacco: Never Used  . Alcohol Use: 75.6 oz/week    126 Cans of beer per week    Review of Systems  Constitutional: Negative.  Negative for fever.  Respiratory: Positive for chest tightness. Negative for shortness of breath.   Cardiovascular: Positive for chest pain and syncope. Negative for leg swelling.  Gastrointestinal: Positive for abdominal pain. Negative for nausea, vomiting and diarrhea.  Genitourinary: Negative.  Negative for dysuria.  Musculoskeletal: Positive for back pain.  Skin: Negative for rash and wound.  Neurological: Negative for headaches.  All other systems reviewed and are negative.     Allergies  Review of patient's allergies indicates no  known allergies.  Home Medications   Prior to Admission medications   Medication Sig Start Date End Date Taking? Authorizing Provider  atenolol (TENORMIN) 25 MG tablet Take 1 tablet (25 mg total) by mouth daily. 03/07/15  Yes Kerrie Buffalo, NP  famotidine (PEPCID) 20 MG tablet Take 1 tablet (20 mg total) by mouth 2 (two) times daily. 03/07/15  Yes Kerrie Buffalo, NP  pantoprazole (PROTONIX) 40 MG tablet Take 1 tablet (40 mg total) by mouth daily. 03/07/15  Yes Kerrie Buffalo, NP  traZODone (DESYREL) 50 MG tablet Take 1 tablet (50 mg total) by mouth at bedtime as needed for sleep. 03/07/15  Yes Kerrie Buffalo, NP  dicyclomine (BENTYL) 10 MG capsule Take 1 capsule (10 mg total) by mouth 3 (three) times daily before meals. 03/08/15   Noah Hacker, MD   BP 124/64 mmHg  Pulse 66  Temp(Src) 98 F (36.7 C) (Oral)  Resp 15  SpO2 98% Physical Exam  Constitutional: He is oriented to person, place, and time.  HENT:  Head: Normocephalic and atraumatic.  Eyes: Pupils are equal, round, and reactive to light.  Cardiovascular: Normal rate, regular rhythm and normal heart sounds.   No murmur heard. Pulmonary/Chest: Effort normal and breath sounds normal. No respiratory distress. He has no wheezes.  Abdominal: Soft. Bowel sounds are normal. He exhibits no distension. There is no tenderness. There is no rebound.  Midline scar noted, multiple ventral hernias, easily reducible  Musculoskeletal: He exhibits no edema.  Neurological: He is alert and oriented to person, place, and time.  Skin: Skin is warm and dry.  Psychiatric:  Intoxicated  Nursing note and vitals reviewed.   ED Course  Procedures   DIAGNOSTIC STUDIES: Oxygen Saturation is 99% on RA, normal by my interpretation.    COORDINATION OF CARE: 1:37 AM Discussed treatment plan with pt at bedside and pt agreed to plan.   Labs Review Labs Reviewed  CBC WITH DIFFERENTIAL/PLATELET - Abnormal; Notable for the following:    RBC 3.36 (*)     Hemoglobin 11.3 (*)    HCT 32.7 (*)    Platelets 39 (*)    All other components within normal limits  COMPREHENSIVE METABOLIC PANEL - Abnormal; Notable for the following:    Glucose, Bld 123 (*)    Albumin 2.9 (*)    AST 58 (*)    Total Bilirubin 2.7 (*)    GFR calc non Af Amer 60 (*)    GFR calc Af Amer 69 (*)    All other components within normal limits  ETHANOL - Abnormal; Notable for the following:    Alcohol, Ethyl (B) 149 (*)    All other components within normal limits  I-STAT CHEM 8, ED - Abnormal; Notable for the following:    Creatinine, Ser 1.40 (*)  Glucose, Bld 121 (*)    Hemoglobin 11.9 (*)    HCT 35.0 (*)    All other components within normal limits  TROPONIN I    Imaging Review Dg Abd Acute W/chest  03/08/2015   CLINICAL DATA:  Acute onset of generalized abdominal pain. Loss of consciousness. Initial encounter.  EXAM: ACUTE ABDOMEN SERIES (ABDOMEN 2 VIEW & CHEST 1 VIEW)  COMPARISON:  Chest radiograph performed 02/07/2014 and CT of the abdomen and pelvis performed 07/03/2014  FINDINGS: The lungs are well-aerated. Vascular congestion is noted. There is no evidence of focal opacification, pleural effusion or pneumothorax. The cardiomediastinal silhouette is within normal limits. Postoperative change is seen about the gastroesophageal junction.  The visualized bowel gas pattern is unremarkable. Scattered stool and air are seen within the colon; there is no evidence of small bowel dilatation to suggest obstruction. No free intra-abdominal air is identified on the provided upright view.  Stones are again noted within the gallbladder, as on prior CT.  No acute osseous abnormalities are seen; the sacroiliac joints are unremarkable in appearance.  IMPRESSION: 1. Unremarkable bowel gas pattern; no free intra-abdominal air seen. Small amount of stool noted in the colon. 2. Cholelithiasis again noted. 3. Mild vascular congestion noted; lungs remain grossly clear.   Electronically  Signed   By: Garald Balding M.D.   On: 03/08/2015 02:09     EKG Interpretation   Date/Time:  Friday March 08 2015 00:42:43 EDT Ventricular Rate:  70 PR Interval:  155 QRS Duration: 112 QT Interval:  421 QTC Calculation: 867 R Axis:   65 Text Interpretation:  Sinus rhythm Confirmed by Nelsy Madonna  MD, Geovana Gebel  ([id number]) on 03/08/2015 1:41:48 AM      MDM   Final diagnoses:  Chronic abdominal pain  Alcohol abuse   Patient presents with abdominal pain and syncope. Was seen and evaluated earlier today for similar complaints.  Complaints appear to be chronic in nature. His neurologically intact. Basic labwork obtained and reassuring. EKG shows no evidence of arrhythmia or QT prolongation. Patient reports improvement of his abdominal pain with Bentyl. No obstruction noted on KUB.    After history, exam, and medical workup I feel the patient has been appropriately medically screened and is safe for discharge home. Pertinent diagnoses were discussed with the patient. Patient was given return precautions.  I personally performed the services described in this documentation, which was scribed in my presence. The recorded information has been reviewed and is accurate.      Noah Hacker, MD 03/08/15 [phone]

## 2015-03-08 NOTE — ED Notes (Signed)
[place] [facility]. Patient reported syncopal episode lasting >1 min and abdominal pain. Per EMS patient was released from [facility] today and is homeless. ETOH endorsed, 6 beers. Oriented and aware upon arrival.

## 2015-03-08 NOTE — ED Notes (Signed)
Pt. Left with all belongings 

## 2015-03-08 NOTE — Discharge Instructions (Signed)
Unfortunately there is nothing I can do for you today. You need to follow-up with Dr. Ninfa Linden as they told you at both emergency rooms last night. Take ibuprofen as needed for your pain.   Abdominal Pain Many things can cause abdominal pain. Usually, abdominal pain is not caused by a disease and will improve without treatment. It can often be observed and treated at home. Your health care provider will do a physical exam and possibly order blood tests and X-rays to help determine the seriousness of your pain. However, in many cases, more time must pass before a clear cause of the pain can be found. Before that point, your health care provider may not know if you need more testing or further treatment. HOME CARE INSTRUCTIONS  Monitor your abdominal pain for any changes. The following actions may help to alleviate any discomfort you are experiencing:  Only take over-the-counter or prescription medicines as directed by your health care provider.  Do not take laxatives unless directed to do so by your health care provider.  Try a clear liquid diet (broth, tea, or water) as directed by your health care provider. Slowly move to a bland diet as tolerated. SEEK MEDICAL CARE IF:  You have unexplained abdominal pain.  You have abdominal pain associated with nausea or diarrhea.  You have pain when you urinate or have a bowel movement.  You experience abdominal pain that wakes you in the night.  You have abdominal pain that is worsened or improved by eating food.  You have abdominal pain that is worsened with eating fatty foods.  You have a fever. SEEK IMMEDIATE MEDICAL CARE IF:   Your pain does not go away within 2 hours.  You keep throwing up (vomiting).  Your pain is felt only in portions of the abdomen, such as the right side or the left lower portion of the abdomen.  You pass bloody or black tarry stools. MAKE SURE YOU:  Understand these instructions.   Will watch your condition.    Will get help right away if you are not doing well or get worse.  Document Released: 09/09/2005 Document Revised: 12/05/2013 Document Reviewed: 08/09/2013 [facility] Patient Information 2015 [place], [place]. This information is not intended to replace advice given to you by your health care provider. Make sure you discuss any questions you have with your health care provider.

## 2015-03-08 NOTE — ED Notes (Signed)
Pt states that he has been having abdominal pain for the past few days pt does state that he has had a hernia for many years.

## 2015-03-08 NOTE — ED Provider Notes (Signed)
CSN: [id number]     Arrival date & time 03/08/15  0807 History   First MD Initiated Contact with Patient 03/08/15 [phone]     Chief Complaint  Patient presents with  . Hernia   (Consider location/radiation/quality/duration/timing/severity/associated sxs/prior Treatment) HPI          59 year old male with history of cirrhosis, hepatitis C, polysubstance abuse, alcohol abuse presents for evaluation of abdominal pain. This started 2 years ago after he had an abdominal wall mesh placed for treatment of hernia. The pain is gotten worse recently. He is also having some diarrhea. No vomiting or fever. Of note, he was discharged from behavioral health yesterday for polysubstance abuse including IV drug abuse and alcohol detox. Review of the electronic medical record reveals that he drank a large amount of alcohol after discharge and has been to 2 most common emergency departments since that time requesting pain medications for his abdominal pain. He has been evaluated places, has had blood tests done, an acute abdominal series x-ray, which reveals no acute abnormalities. He was told to follow-up with his general surgeon, Dr. Ninfa Linden.  Past Medical History  Diagnosis Date  . Cirrhosis of liver   . Hepatitis C 03-28-12    ? 80's-prev. IV drug abuse  . 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 Blood transfusion 03-28-12    '81-hx. of bleeding ulcer  . History of bleeding ulcers 03-28-12    Hx. '81  . GERD (gastroesophageal reflux disease) 03-26-12    Hx. reflux. ulcers in past, occ. OTC med  . Incisional hernia     has drainage system from abdomin  . Subdural hematoma 12/13/12   Past Surgical History  Procedure Laterality Date  . Umbilical hernia repair    . Hernia repair  08/24/11    ventral hernia with mesh  . Hernia repair  40'97    Umbilical  . Peptic ulcer  03-26-12    open peptic ulcer surgery repair- '81  . Incisional hernia repair  03/26/2012  . Incisional hernia repair   03/30/2012    Procedure: LAPAROSCOPIC INCISIONAL HERNIA;  Surgeon: Harl Bowie, MD;  Location: [facility];  Service: General;  Laterality: N/A;  Laparoscopic Incisional/ventral Hernia Repair with Mesh  . Tonsillectomy    . Appendectomy    . Incisional hernia repair  07/20/2012    mesh  . Incisional hernia repair  07/20/2012    Procedure: HERNIA REPAIR INCISIONAL;  Surgeon: Harl Bowie, MD;  Location: [place];  Service: General;  Laterality: N/A;  repair of incisional hernia with Biologic mesh   Family History  Problem Relation Age of Onset  . Cancer Mother     breast  . Cancer Father     brain  . Heart disease Brother   . Cancer Sister     breast  . Cancer Sister     breast   History  Substance Use Topics  . Smoking status: Current Every Day Smoker -- 0.25 packs/day for 38 years    Types: Cigarettes  . Smokeless tobacco: Never Used  . Alcohol Use: 75.6 oz/week    126 Cans of beer per week    Review of Systems  Gastrointestinal: Positive for abdominal pain and diarrhea.  All other systems reviewed and are negative.   Allergies  Review of patient's allergies indicates no known allergies.  Home Medications   Prior to Admission medications   Medication Sig Start Date End Date Taking? Authorizing Provider  atenolol (TENORMIN)  25 MG tablet Take 1 tablet (25 mg total) by mouth daily. 03/07/15   Kerrie Buffalo, NP  dicyclomine (BENTYL) 10 MG capsule Take 1 capsule (10 mg total) by mouth 3 (three) times daily before meals. 03/08/15   [name], MD  famotidine (PEPCID) 20 MG tablet Take 1 tablet (20 mg total) by mouth 2 (two) times daily. 03/07/15   Kerrie Buffalo, NP  pantoprazole (PROTONIX) 40 MG tablet Take 1 tablet (40 mg total) by mouth daily. 03/07/15   Kerrie Buffalo, NP  traZODone (DESYREL) 50 MG tablet Take 1 tablet (50 mg total) by mouth at bedtime as needed for sleep. 03/07/15   Kerrie Buffalo, NP   BP 144/68 mmHg  Pulse 74  Temp(Src) 97.8 F (36.6 C)  (Oral)  Resp 22  SpO2 100% Physical Exam  Constitutional: He is oriented to person, place, and time. He appears well-developed and well-nourished. No distress.  HENT:  Head: Normocephalic.  Cardiovascular: Normal rate, regular rhythm and normal heart sounds.   Pulmonary/Chest: Effort normal and breath sounds normal. No respiratory distress.  Abdominal: Soft. Bowel sounds are normal. He exhibits no distension, no abdominal bruit, no ascites and no mass. There is tenderness in the periumbilical area. There is no rigidity, no guarding, no CVA tenderness, no tenderness at McBurney's point and negative Murphy's sign. A hernia (fullness underneath the midline abdominal incision original hernia, nontender) is present. Hernia confirmed positive in the ventral area.  Neurological: He is alert and oriented to person, place, and time. Coordination normal.  Skin: Skin is warm and dry. No rash noted. He is not diaphoretic.  Psychiatric: He has a normal mood and affect. Judgment normal.  Nursing note and vitals reviewed.   ED Course  Procedures (including critical care time) Labs Review Labs Reviewed - No data to display  Imaging Review Dg Abd Acute W/chest  03/08/2015   CLINICAL DATA:  Acute onset of generalized abdominal pain. Loss of consciousness. Initial encounter.  EXAM: ACUTE ABDOMEN SERIES (ABDOMEN 2 VIEW & CHEST 1 VIEW)  COMPARISON:  Chest radiograph performed 02/07/2014 and CT of the abdomen and pelvis performed 07/03/2014  FINDINGS: The lungs are well-aerated. Vascular congestion is noted. There is no evidence of focal opacification, pleural effusion or pneumothorax. The cardiomediastinal silhouette is within normal limits. Postoperative change is seen about the gastroesophageal junction.  The visualized bowel gas pattern is unremarkable. Scattered stool and air are seen within the colon; there is no evidence of small bowel dilatation to suggest obstruction. No free intra-abdominal air is  identified on the provided upright view.  Stones are again noted within the gallbladder, as on prior CT.  No acute osseous abnormalities are seen; the sacroiliac joints are unremarkable in appearance.  IMPRESSION: 1. Unremarkable bowel gas pattern; no free intra-abdominal air seen. Small amount of stool noted in the colon. 2. Cholelithiasis again noted. 3. Mild vascular congestion noted; lungs remain grossly clear.   Electronically Signed   By: Garald Balding M.D.   On: 03/08/2015 02:09     MDM   1. Generalized abdominal pain    This patient has been worked up 2 separate times in the past 12 hours. His pain has not changed in that time. He was given a shot of Toradol last night which helped, he is requesting that again. Given that he just said 60 mg of Toradol 8 hours ago I do not think another injection is appropriate or safe. I offered antiemetics which he declines. He will follow-up with  his general Psychologist, sport and exercise. I have instructed him to also go back to the emergency department if the pain worsens    Liam Graham, PA-C 03/08/15 [phone]

## 2015-03-11 NOTE — Progress Notes (Signed)
Patient Discharge Instructions:  After Visit Summary (AVS):   Faxed to:  03/11/15 Discharge Summary Note:   Faxed to:  03/11/15 Psychiatric Admission Assessment Note:   Faxed to:  03/11/15 Suicide Risk Assessment - Discharge Assessment:   Faxed to:  03/11/15 Faxed/Sent to the Next Level Care provider:  03/11/15 Faxed to [facility] @ [phone] Faxed to [facility] @ [place], 03/11/2015, 4:09 PM

## 2015-03-12 ENCOUNTER — Emergency Department: Admit: 2015-03-12 | Disposition: A | Discharge status: Home / Self Care | Payer: Self-pay | Admitting: Internal Medicine

## 2015-03-12 LAB — URINALYSIS, COMPLETE
BILIRUBIN, UR: NEGATIVE
Bacteria: NONE SEEN
Glucose,UR: 50 mg/dL (ref 0–75)
Ketone: NEGATIVE
Leukocyte Esterase: NEGATIVE
NITRITE: NEGATIVE
Ph: 6 (ref 4.5–8.0)
Protein: NEGATIVE
RBC,UR: 11 /HPF (ref 0–5)
SPECIFIC GRAVITY: 1.023 (ref 1.003–1.030)
SQUAMOUS EPITHELIAL: NONE SEEN

## 2015-03-12 LAB — COMPREHENSIVE METABOLIC PANEL
ALBUMIN: 3.3 g/dL — AB
ALT: 37 U/L
AST: 84 U/L — AB
Alkaline Phosphatase: 81 U/L
Anion Gap: 8 (ref 7–16)
BUN: 11 mg/dL
Bilirubin,Total: 3.4 mg/dL — ABNORMAL HIGH
CREATININE: 0.92 mg/dL
Calcium, Total: 8.6 mg/dL — ABNORMAL LOW
Chloride: 108 mmol/L
Co2: 23 mmol/L
EGFR (African American): >60
EGFR (Non-African Amer.): >60
Glucose: 124 mg/dL — ABNORMAL HIGH
Potassium: 3.8 mmol/L
Sodium: 139 mmol/L
TOTAL PROTEIN: 7.1 g/dL

## 2015-03-12 LAB — CBC
HCT: 40.6 % (ref 40.0–52.0)
HGB: 13.6 g/dL (ref 13.0–18.0)
MCH: 33.7 pg (ref 26.0–34.0)
MCHC: 33.5 g/dL (ref 32.0–36.0)
MCV: 100 fL (ref 80–100)
RBC: 4.04 10*6/uL — AB (ref 4.40–5.90)
RDW: 15.5 % — ABNORMAL HIGH (ref 11.5–14.5)
WBC: 4.6 10*3/uL (ref 3.8–10.6)

## 2015-03-12 LAB — DRUG SCREEN, URINE
AMPHETAMINES, UR SCREEN: NEGATIVE
Barbiturates, Ur Screen: NEGATIVE
Benzodiazepine, Ur Scrn: NEGATIVE
Cannabinoid 50 Ng, Ur ~~LOC~~: POSITIVE
Cocaine Metabolite,Ur ~~LOC~~: NEGATIVE
MDMA (ECSTASY) UR SCREEN: NEGATIVE
Methadone, Ur Screen: NEGATIVE
Opiate, Ur Screen: NEGATIVE
PHENCYCLIDINE (PCP) UR S: NEGATIVE
TRICYCLIC, UR SCREEN: NEGATIVE

## 2015-03-12 LAB — ACETAMINOPHEN LEVEL: Acetaminophen: <10 ug/mL

## 2015-03-12 LAB — SALICYLATE LEVEL: Salicylates, Serum: <4 mg/dL

## 2015-03-12 LAB — ETHANOL: Ethanol: <5 mg/dL

## 2015-03-17 ENCOUNTER — Encounter (HOSPITAL_COMMUNITY): Payer: Self-pay | Admitting: Emergency Medicine

## 2015-03-17 ENCOUNTER — Emergency Department (HOSPITAL_COMMUNITY)
Admission: EM | Admit: 2015-03-17 | Discharge: 2015-03-17 | Disposition: A | Discharge status: Home / Self Care | Payer: Medicaid Other | Source: Home / Self Care | Attending: Emergency Medicine | Admitting: Emergency Medicine

## 2015-03-17 ENCOUNTER — Other Ambulatory Visit (HOSPITAL_COMMUNITY): Payer: Self-pay

## 2015-03-17 ENCOUNTER — Emergency Department (HOSPITAL_COMMUNITY): Payer: Medicaid Other

## 2015-03-17 ENCOUNTER — Inpatient Hospital Stay (HOSPITAL_COMMUNITY)
Admission: EM | Admit: 2015-03-17 | Discharge: 2015-03-20 | DRG: 086 | Disposition: A | Discharge status: Home / Self Care | Payer: Medicaid Other | Attending: Internal Medicine | Admitting: Internal Medicine

## 2015-03-17 DIAGNOSIS — G4089 Other seizures: Secondary | ICD-10-CM | POA: Diagnosis present

## 2015-03-17 DIAGNOSIS — Z8619 Personal history of other infectious and parasitic diseases: Secondary | ICD-10-CM

## 2015-03-17 DIAGNOSIS — E870 Hyperosmolality and hypernatremia: Secondary | ICD-10-CM | POA: Diagnosis present

## 2015-03-17 DIAGNOSIS — D6959 Other secondary thrombocytopenia: Secondary | ICD-10-CM | POA: Diagnosis present

## 2015-03-17 DIAGNOSIS — K7460 Unspecified cirrhosis of liver: Secondary | ICD-10-CM | POA: Diagnosis not present

## 2015-03-17 DIAGNOSIS — K219 Gastro-esophageal reflux disease without esophagitis: Secondary | ICD-10-CM | POA: Diagnosis present

## 2015-03-17 DIAGNOSIS — F10129 Alcohol abuse with intoxication, unspecified: Secondary | ICD-10-CM | POA: Diagnosis present

## 2015-03-17 DIAGNOSIS — S06369D Traumatic hemorrhage of cerebrum, unspecified, with loss of consciousness of unspecified duration, subsequent encounter: Secondary | ICD-10-CM | POA: Diagnosis not present

## 2015-03-17 DIAGNOSIS — W19XXXA Unspecified fall, initial encounter: Secondary | ICD-10-CM | POA: Diagnosis present

## 2015-03-17 DIAGNOSIS — I609 Nontraumatic subarachnoid hemorrhage, unspecified: Secondary | ICD-10-CM | POA: Diagnosis not present

## 2015-03-17 DIAGNOSIS — Y908 Blood alcohol level of 240 mg/100 ml or more: Secondary | ICD-10-CM | POA: Diagnosis present

## 2015-03-17 DIAGNOSIS — R079 Chest pain, unspecified: Secondary | ICD-10-CM

## 2015-03-17 DIAGNOSIS — E876 Hypokalemia: Secondary | ICD-10-CM | POA: Diagnosis present

## 2015-03-17 DIAGNOSIS — R569 Unspecified convulsions: Secondary | ICD-10-CM | POA: Diagnosis not present

## 2015-03-17 DIAGNOSIS — R270 Ataxia, unspecified: Secondary | ICD-10-CM | POA: Diagnosis not present

## 2015-03-17 DIAGNOSIS — S065X0A Traumatic subdural hemorrhage without loss of consciousness, initial encounter: Principal | ICD-10-CM | POA: Diagnosis present

## 2015-03-17 DIAGNOSIS — D696 Thrombocytopenia, unspecified: Secondary | ICD-10-CM | POA: Diagnosis present

## 2015-03-17 DIAGNOSIS — Z79899 Other long term (current) drug therapy: Secondary | ICD-10-CM

## 2015-03-17 DIAGNOSIS — F17210 Nicotine dependence, cigarettes, uncomplicated: Secondary | ICD-10-CM | POA: Diagnosis present

## 2015-03-17 DIAGNOSIS — F10929 Alcohol use, unspecified with intoxication, unspecified: Secondary | ICD-10-CM | POA: Diagnosis present

## 2015-03-17 DIAGNOSIS — Z87828 Personal history of other (healed) physical injury and trauma: Secondary | ICD-10-CM | POA: Diagnosis not present

## 2015-03-17 DIAGNOSIS — Z590 Homelessness: Secondary | ICD-10-CM | POA: Diagnosis not present

## 2015-03-17 DIAGNOSIS — R0602 Shortness of breath: Secondary | ICD-10-CM

## 2015-03-17 DIAGNOSIS — F1010 Alcohol abuse, uncomplicated: Secondary | ICD-10-CM

## 2015-03-17 DIAGNOSIS — R42 Dizziness and giddiness: Secondary | ICD-10-CM | POA: Diagnosis present

## 2015-03-17 DIAGNOSIS — Z8679 Personal history of other diseases of the circulatory system: Secondary | ICD-10-CM | POA: Diagnosis not present

## 2015-03-17 DIAGNOSIS — F10120 Alcohol abuse with intoxication, uncomplicated: Secondary | ICD-10-CM | POA: Diagnosis not present

## 2015-03-17 DIAGNOSIS — I619 Nontraumatic intracerebral hemorrhage, unspecified: Secondary | ICD-10-CM

## 2015-03-17 DIAGNOSIS — Z720 Tobacco use: Secondary | ICD-10-CM | POA: Diagnosis not present

## 2015-03-17 HISTORY — DX: Homelessness unspecified: Z59.00

## 2015-03-17 HISTORY — DX: Alcohol abuse, uncomplicated: F10.10

## 2015-03-17 HISTORY — DX: Homelessness: Z59.0

## 2015-03-17 LAB — CBC WITH DIFFERENTIAL/PLATELET
Basophils Absolute: 0 10*3/uL (ref 0.0–0.1)
Basophils Relative: 0 % (ref 0–1)
Eosinophils Absolute: 0.1 10*3/uL (ref 0.0–0.7)
Eosinophils Relative: 3 % (ref 0–5)
HCT: 35.4 % — ABNORMAL LOW (ref 39.0–52.0)
Hemoglobin: 12.6 g/dL — ABNORMAL LOW (ref 13.0–17.0)
Lymphocytes Relative: 33 % (ref 12–46)
Lymphs Abs: 1.6 10*3/uL (ref 0.7–4.0)
MCH: 34.1 pg — ABNORMAL HIGH (ref 26.0–34.0)
MCHC: 35.6 g/dL (ref 30.0–36.0)
MCV: 95.9 fL (ref 78.0–100.0)
Monocytes Absolute: 0.4 10*3/uL (ref 0.1–1.0)
Monocytes Relative: 8 % (ref 3–12)
Neutro Abs: 2.7 10*3/uL (ref 1.7–7.7)
Neutrophils Relative %: 56 % (ref 43–77)
Platelets: 66 10*3/uL — ABNORMAL LOW (ref 150–400)
RBC: 3.69 MIL/uL — ABNORMAL LOW (ref 4.22–5.81)
RDW: 15.7 % — ABNORMAL HIGH (ref 11.5–15.5)
WBC: 4.9 10*3/uL (ref 4.0–10.5)

## 2015-03-17 LAB — CBC
HEMATOCRIT: 35.5 % — AB (ref 39.0–52.0)
Hemoglobin: 12.6 g/dL — ABNORMAL LOW (ref 13.0–17.0)
MCH: 33.3 pg (ref 26.0–34.0)
MCHC: 35.5 g/dL (ref 30.0–36.0)
MCV: 93.9 fL (ref 78.0–100.0)
PLATELETS: 57 10*3/uL — AB (ref 150–400)
RBC: 3.78 MIL/uL — ABNORMAL LOW (ref 4.22–5.81)
RDW: 15.6 % — ABNORMAL HIGH (ref 11.5–15.5)
WBC: 4.8 10*3/uL (ref 4.0–10.5)

## 2015-03-17 LAB — BASIC METABOLIC PANEL
Anion gap: 8 (ref 5–15)
Anion gap: 10 (ref 5–15)
BUN: 12 mg/dL (ref 6–23)
BUN: 13 mg/dL (ref 6–23)
CHLORIDE: 111 mmol/L (ref 96–112)
CO2: 24 mmol/L (ref 19–32)
CO2: 23 mmol/L (ref 19–32)
CREATININE: 1 mg/dL (ref 0.50–1.35)
Calcium: 8.1 mg/dL — ABNORMAL LOW (ref 8.4–10.5)
Calcium: 8.1 mg/dL — ABNORMAL LOW (ref 8.4–10.5)
Chloride: 111 mmol/L (ref 96–112)
Creatinine, Ser: 0.9 mg/dL (ref 0.50–1.35)
GFR calc Af Amer: >90 mL/min (ref >90)
GFR calc Af Amer: >90 mL/min (ref >90)
GFR calc non Af Amer: 80 mL/min — ABNORMAL LOW (ref >90)
GFR calc non Af Amer: >90 mL/min (ref >90)
Glucose, Bld: 111 mg/dL — ABNORMAL HIGH (ref 70–99)
Glucose, Bld: 89 mg/dL (ref 70–99)
Potassium: 3.8 mmol/L (ref 3.5–5.1)
Potassium: 4.1 mmol/L (ref 3.5–5.1)
Sodium: 143 mmol/L (ref 135–145)
Sodium: 144 mmol/L (ref 135–145)

## 2015-03-17 LAB — APTT: aPTT: 35 seconds (ref 24–37)

## 2015-03-17 LAB — MRSA PCR SCREENING: MRSA by PCR: POSITIVE — AB

## 2015-03-17 LAB — PROTIME-INR
INR: 1.45 (ref 0.00–1.49)
PROTHROMBIN TIME: 17.8 s — AB (ref 11.6–15.2)

## 2015-03-17 LAB — I-STAT TROPONIN, ED: Troponin i, poc: 0.01 ng/mL (ref 0.00–0.08)

## 2015-03-17 LAB — ETHANOL
Alcohol, Ethyl (B): 355 mg/dL — ABNORMAL HIGH (ref 0–9)
Alcohol, Ethyl (B): 318 mg/dL — ABNORMAL HIGH (ref 0–9)

## 2015-03-17 LAB — BRAIN NATRIURETIC PEPTIDE: B NATRIURETIC PEPTIDE 5: 43.7 pg/mL (ref 0.0–100.0)

## 2015-03-17 MED ORDER — LEVETIRACETAM 500 MG/5ML IV SOLN
1000.0000 mg | Freq: Once | INTRAVENOUS | Status: AC
  Administered 2015-03-17: 1000 mg via INTRAVENOUS
  Filled 2015-03-17: qty 10

## 2015-03-17 MED ORDER — LORAZEPAM 1 MG PO TABS
1.0000 mg | ORAL_TABLET | Freq: Four times a day (QID) | ORAL | Status: DC | PRN
  Administered 2015-03-19: 1 mg via ORAL
  Filled 2015-03-17: qty 1

## 2015-03-17 MED ORDER — LORAZEPAM 2 MG/ML IJ SOLN
2.0000 mg | INTRAMUSCULAR | Status: DC | PRN
  Administered 2015-03-17 – 2015-03-18 (×3): 2 mg via INTRAVENOUS
  Administered 2015-03-18: 3 mg via INTRAVENOUS
  Administered 2015-03-18 (×4): 2 mg via INTRAVENOUS
  Administered 2015-03-18: 3 mg via INTRAVENOUS
  Administered 2015-03-18 – 2015-03-19 (×6): 2 mg via INTRAVENOUS
  Filled 2015-03-17 (×2): qty 2
  Filled 2015-03-17 (×13): qty 1

## 2015-03-17 MED ORDER — ONDANSETRON HCL 4 MG/2ML IJ SOLN: 4.0000 mg | Freq: Four times a day (QID) | INTRAMUSCULAR | Status: DC | PRN

## 2015-03-17 MED ORDER — LEVETIRACETAM 500 MG/5ML IV SOLN
500.0000 mg | Freq: Two times a day (BID) | INTRAVENOUS | Status: DC
  Administered 2015-03-17 – 2015-03-19 (×4): 500 mg via INTRAVENOUS
  Filled 2015-03-17 (×5): qty 5

## 2015-03-17 MED ORDER — ONDANSETRON HCL 4 MG PO TABS: 4.0000 mg | ORAL_TABLET | Freq: Four times a day (QID) | ORAL | Status: DC | PRN

## 2015-03-17 MED ORDER — LORAZEPAM 2 MG/ML IJ SOLN
INTRAMUSCULAR | Status: AC
  Administered 2015-03-17: 2 mg via INTRAMUSCULAR
  Filled 2015-03-17: qty 1

## 2015-03-17 MED ORDER — LEVETIRACETAM 500 MG PO TABS
500.0000 mg | ORAL_TABLET | Freq: Two times a day (BID) | ORAL | Status: DC
  Filled 2015-03-17: qty 1

## 2015-03-17 MED ORDER — FOLIC ACID 1 MG PO TABS
1.0000 mg | ORAL_TABLET | Freq: Every day | ORAL | Status: DC
  Administered 2015-03-18 – 2015-03-20 (×3): 1 mg via ORAL
  Filled 2015-03-17 (×4): qty 1

## 2015-03-17 MED ORDER — ACETAMINOPHEN 650 MG RE SUPP: 650.0000 mg | Freq: Four times a day (QID) | RECTAL | Status: DC | PRN

## 2015-03-17 MED ORDER — LORAZEPAM 2 MG/ML IJ SOLN
2.0000 mg | Freq: Once | INTRAMUSCULAR | Status: AC
  Administered 2015-03-17: 2 mg via INTRAMUSCULAR

## 2015-03-17 MED ORDER — ADULT MULTIVITAMIN W/MINERALS CH
1.0000 | ORAL_TABLET | Freq: Every day | ORAL | Status: DC
  Administered 2015-03-18 – 2015-03-20 (×3): 1 via ORAL
  Filled 2015-03-17 (×4): qty 1

## 2015-03-17 MED ORDER — LORAZEPAM 2 MG/ML IJ SOLN
1.0000 mg | Freq: Four times a day (QID) | INTRAMUSCULAR | Status: DC | PRN
  Administered 2015-03-17: 1 mg via INTRAVENOUS
  Filled 2015-03-17: qty 1

## 2015-03-17 MED ORDER — ACETAMINOPHEN 325 MG PO TABS: 650.0000 mg | ORAL_TABLET | Freq: Four times a day (QID) | ORAL | Status: DC | PRN

## 2015-03-17 MED ORDER — SODIUM CHLORIDE 0.9 % IV SOLN
INTRAVENOUS | Status: DC
  Administered 2015-03-17: 1000 mL via INTRAVENOUS
  Administered 2015-03-17 – 2015-03-19 (×2): via INTRAVENOUS

## 2015-03-17 MED ORDER — THIAMINE HCL 100 MG/ML IJ SOLN
100.0000 mg | Freq: Every day | INTRAMUSCULAR | Status: DC
  Administered 2015-03-17: 100 mg via INTRAVENOUS
  Filled 2015-03-17 (×3): qty 1
  Filled 2015-03-17: qty 2

## 2015-03-17 MED ORDER — ASPIRIN 81 MG PO CHEW
324.0000 mg | CHEWABLE_TABLET | Freq: Once | ORAL | Status: AC
  Administered 2015-03-17: 324 mg via ORAL
  Filled 2015-03-17: qty 4

## 2015-03-17 MED ORDER — VITAMIN B-1 100 MG PO TABS
100.0000 mg | ORAL_TABLET | Freq: Every day | ORAL | Status: DC
  Administered 2015-03-18 – 2015-03-20 (×3): 100 mg via ORAL
  Filled 2015-03-17 (×4): qty 1

## 2015-03-17 NOTE — ED Notes (Signed)
After much discussion with RN's, pt decided to leave AMA. Pt has been advised of the risks. Pt ambulatory upon leaving, no difficulty walking.

## 2015-03-17 NOTE — ED Notes (Signed)
Pt ambulatory to restroom

## 2015-03-17 NOTE — ED Notes (Signed)
Pt. arrived with EMS from a local motel ( homeless) reports intermittent left side chest pain with mild SOB onset yesterday afternoon  , denies nausea or diaphoresis , intoxicated with ETOH , refused NTG and ASA by EMS .

## 2015-03-17 NOTE — Consult Note (Addendum)
NEURO HOSPITALIST CONSULT NOTE    Reason for Consult: SAH with new onset seizure  HPI:                                                                                                                                          Noah Medina is an 59 y.o. male with a past medical history that is relevant for alcohol abuse, hepatitis C, liver cirrhosis,chronic thrombocytopenia, SDH 12/13, admitted to the hospital after sustaining a fall resulting in a small SAH and subsequent development of a GTC seizure witnessed by ED staff. Patient denies having seizures before and said that he remembers falling today without any warning symptoms " I just went down". He was intoxicated upon initial ED assessment, with ETOH level 318. Received 2 mg IV ativan and loading dose 1 gram IV keppra without further seizures. Started on CIWA protocol. CT brain was personally reviewed and showed acute trace right parietal convexity subarachnoid hemorrhage. Platelets 66, unremarkable metabolic panel, normal white count. Presently, he is drowsy but able to follow simple commands.  Past Medical History  Diagnosis Date  . Cirrhosis of liver   . Hepatitis C 03-28-12    ? 80's-prev. IV drug abuse  . 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 Blood transfusion 03-28-12    '81-hx. of bleeding ulcer  . History of bleeding ulcers 03-28-12    Hx. '81  . GERD (gastroesophageal reflux disease) 03-26-12    Hx. reflux. ulcers in past, occ. OTC med  . Incisional hernia     has drainage system from abdomin  . Subdural hematoma 12/13/12  . Alcohol abuse   . Homelessness     Past Surgical History  Procedure Laterality Date  . Umbilical hernia repair    . Hernia repair  08/24/11    ventral hernia with mesh  . Hernia repair  67'73    Umbilical  . Peptic ulcer  03-26-12    open peptic ulcer surgery repair- '81  . Incisional hernia repair  03/26/2012  . Incisional hernia repair   03/30/2012    Procedure: LAPAROSCOPIC INCISIONAL HERNIA;  Surgeon: Harl Bowie, MD;  Location: [facility];  Service: General;  Laterality: N/A;  Laparoscopic Incisional/ventral Hernia Repair with Mesh  . Tonsillectomy    . Appendectomy    . Incisional hernia repair  07/20/2012    mesh  . Incisional hernia repair  07/20/2012    Procedure: HERNIA REPAIR INCISIONAL;  Surgeon: Harl Bowie, MD;  Location: [place];  Service: General;  Laterality: N/A;  repair of incisional hernia with Biologic mesh    Family History  Problem Relation Age of Onset  . Cancer Mother     breast  . Cancer Father     brain  .  Heart disease Brother   . Cancer Sister     breast  . Cancer Sister     breast    Family History: no brain tumors, brain aneurysms, or epilepsy.   Social History:  reports that he has been smoking Cigarettes.  He has a 9.5 pack-year smoking history. He has never used smokeless tobacco. He reports that he drinks about 75.6 oz of alcohol per week. He reports that he uses illicit drugs (Cocaine, Marijuana, Methamphetamines, and IV).  No Known Allergies  MEDICATIONS:                                                                                                                     Scheduled: . folic acid  1 mg Oral Daily  . multivitamin with minerals  1 tablet Oral Daily  . thiamine  100 mg Oral Daily   Or  . thiamine  100 mg Intravenous Daily     ROS:                                                                                                                                       History obtained from the patient and chart review.  General ROS: negative for - chills, fatigue, fever, night sweats, weight gain or weight loss Psychological ROS: negative for - behavioral disorder, hallucinations, memory difficulties, mood swings or suicidal ideation Ophthalmic ROS: negative for - blurry vision, double vision, eye pain or loss of vision ENT ROS: negative for - epistaxis, nasal  discharge, oral lesions, sore throat, tinnitus or vertigo Allergy and Immunology ROS: negative for - hives or itchy/watery eyes Hematological and Lymphatic ROS: negative for - bleeding problems, bruising or swollen lymph nodes Endocrine ROS: negative for - galactorrhea, hair pattern changes, polydipsia/polyuria or temperature intolerance Respiratory ROS: negative for - cough, hemoptysis, shortness of breath or wheezing Cardiovascular ROS: negative for - chest pain, dyspnea on exertion, edema or irregular heartbeat Gastrointestinal ROS: negative for - abdominal pain, diarrhea, hematemesis, nausea/vomiting or stool incontinence Genito-Urinary ROS: negative for - dysuria, hematuria, incontinence or urinary frequency/urgency Musculoskeletal ROS: negative for - joint swelling or muscular weakness Neurological ROS: as noted in HPI Dermatological ROS: negative for rash and skin lesion changes  Physical exam: drowsy, tremulous, no apparent distress. Blood pressure 125/62, pulse 81, temperature 97.8 F (36.6 C), temperature source Oral, resp. rate 12, height $RemoveBe'5\' 11"'[id number]$  (1.803 m), weight 90.4 kg (199 lb  4.7 oz), SpO2 96 %. Head: normocephalic. Neck: supple, no bruits, no JVD. Cardiac: no murmurs. Lungs: clear. Abdomen: soft, no tender, no mass. Extremities: no edema. Skin: no rash  Neurologic Examination:                                                                                                      General: Mental Status: Alert, oriented, thought content appropriate.  Speech fluent without evidence of aphasia.  Able to follow 3 step commands without difficulty. Cranial Nerves: II: Discs flat bilaterally; Visual fields grossly normal, pupils equal, round, reactive to light and accommodation III,IV, VI: ptosis not present, extra-ocular motions intact bilaterally V,VII: smile symmetric, facial light touch sensation normal bilaterally VIII: hearing normal bilaterally IX,X: uvula rises  symmetrically XI: bilateral shoulder shrug XII: midline tongue extension without atrophy or fasciculations Motor: Moves all limbs symmetrically Tone and bulk:normal tone throughout; no atrophy noted Sensory: Pinprick and light touch intact throughout, bilaterally Deep Tendon Reflexes:  Right: Upper Extremity   Left: Upper extremity   biceps (C-5 to C-6) 2/4   biceps (C-5 to C-6) 2/4 tricep (C7) 2/4    triceps (C7) 2/4 Brachioradialis (C6) 2/4  Brachioradialis (C6) 2/4  Lower Extremity Lower Extremity  quadriceps (L-2 to L-4) 2/4   quadriceps (L-2 to L-4) 2/4 Achilles (S1) 2/4   Achilles (S1) 2/4  Plantars: Right: downgoing   Left: downgoing Cerebellar: Marked postural-action tremors both arms. Gait:  No tested for safety reasons.    No results found for: CHOL  Results for orders placed or performed during the hospital encounter of 03/17/15 (from the past 48 hour(s))  CBC with Differential     Status: Abnormal   Collection Time: 03/17/15 12:44 PM  Result Value Ref Range   WBC 4.9 4.0 - 10.5 K/uL   RBC 3.69 (L) 4.22 - 5.81 MIL/uL   Hemoglobin 12.6 (L) 13.0 - 17.0 g/dL   HCT 35.4 (L) 39.0 - 52.0 %   MCV 95.9 78.0 - 100.0 fL   MCH 34.1 (H) 26.0 - 34.0 pg   MCHC 35.6 30.0 - 36.0 g/dL   RDW 15.7 (H) 11.5 - 15.5 %   Platelets 66 (L) 150 - 400 K/uL    Comment: RESULT REPEATED AND VERIFIED SPECIMEN CHECKED FOR CLOTS CONSISTENT WITH PREVIOUS RESULT    Neutrophils Relative % 56 43 - 77 %   Neutro Abs 2.7 1.7 - 7.7 K/uL   Lymphocytes Relative 33 12 - 46 %   Lymphs Abs 1.6 0.7 - 4.0 K/uL   Monocytes Relative 8 3 - 12 %   Monocytes Absolute 0.4 0.1 - 1.0 K/uL   Eosinophils Relative 3 0 - 5 %   Eosinophils Absolute 0.1 0.0 - 0.7 K/uL   Basophils Relative 0 0 - 1 %   Basophils Absolute 0.0 0.0 - 0.1 K/uL  Basic metabolic panel     Status: Abnormal   Collection Time: 03/17/15 12:44 PM  Result Value Ref Range   Sodium 144 135 - 145 mmol/L   Potassium 4.1 3.5 - 5.1 mmol/L  Chloride 111 96 - 112 mmol/L   CO2 23 19 - 32 mmol/L   Glucose, Bld 89 70 - 99 mg/dL   BUN 13 6 - 23 mg/dL   Creatinine, Ser 0.90 0.50 - 1.35 mg/dL   Calcium 8.1 (L) 8.4 - 10.5 mg/dL   GFR calc non Af Amer >90 >90 mL/min   GFR calc Af Amer >90 >90 mL/min    Comment: (NOTE) The eGFR has been calculated using the CKD EPI equation. This calculation has not been validated in all clinical situations. eGFR's persistently <90 mL/min signify possible Chronic Kidney Disease.    Anion gap 10 5 - 15  Ethanol     Status: Abnormal   Collection Time: 03/17/15 12:45 PM  Result Value Ref Range   Alcohol, Ethyl (B) 318 (H) 0 - 9 mg/dL    Comment:        LOWEST DETECTABLE LIMIT FOR SERUM ALCOHOL IS 11 mg/dL FOR MEDICAL PURPOSES ONLY   Protime-INR     Status: Abnormal   Collection Time: 03/17/15  2:30 PM  Result Value Ref Range   Prothrombin Time 17.8 (H) 11.6 - 15.2 seconds   INR 1.45 0.00 - 1.49  APTT     Status: None   Collection Time: 03/17/15  2:30 PM  Result Value Ref Range   aPTT 35 24 - 37 seconds    Dg Chest 2 View  03/17/2015   CLINICAL DATA:  Chest pain shortness of Breath  EXAM: CHEST  2 VIEW  COMPARISON:  03/08/2015  FINDINGS: The heart size and mediastinal contours are within normal limits. Both lungs are clear. The visualized skeletal structures are unremarkable. Postsurgical changes are noted about the gastroesophageal junction.  IMPRESSION: No active cardiopulmonary disease.   Electronically Signed   By: Inez Catalina M.D.   On: 03/17/2015 07:25   Ct Head Wo Contrast  03/17/2015   CLINICAL DATA:  Intoxication, fall, occipital head trauma  EXAM: CT HEAD WITHOUT CONTRAST  CT CERVICAL SPINE WITHOUT CONTRAST  TECHNIQUE: Multidetector CT imaging of the head and cervical spine was performed following the standard protocol without intravenous contrast. Multiplanar CT image reconstructions of the cervical spine were also generated.  COMPARISON:  03/01/2015 brain MRI and head CT, C-spine CT  12/15/2014  FINDINGS: CT HEAD FINDINGS  Trace right high parietal convexity subarachnoid hemorrhage is identified, image 22. No mass lesion or infarct is identified. Stable degree of cortical volume loss with proportional ventricular prominence. No midline shift. Orbits and paranasal sinuses are unremarkable. No skull fracture. Right occipital scalp swelling is evident.  CT CERVICAL SPINE FINDINGS  C1 through the cervicothoracic junction is visualized in its entirety. Normal alignment. No precervical soft tissue widening. No fracture or dislocation. Mild inferior cervical spine degenerative change is identified with mild bilateral neural foraminal narrowing, right greater than left, spanning C5-T1. No fracture or dislocation. Emphysematous changes are partly visualized at the lung apices.  IMPRESSION: Acute trace right parietal convexity subarachnoid hemorrhage. No midline shift or mass effect upon the lateral ventricles.  Inferior cervical spine disc degenerative change without focal acute finding.  Critical Value/emergent results were called by telephone at the time of interpretation on 03/17/2015 at 12:27 pm to Dr. Virgel Manifold , who verbally acknowledged these results.   Electronically Signed   By: Conchita Paris M.D.   On: 03/17/2015 12:30   Ct Cervical Spine Wo Contrast  03/17/2015   CLINICAL DATA:  Intoxication, fall, occipital head trauma  EXAM: CT HEAD WITHOUT CONTRAST  CT CERVICAL SPINE WITHOUT CONTRAST  TECHNIQUE: Multidetector CT imaging of the head and cervical spine was performed following the standard protocol without intravenous contrast. Multiplanar CT image reconstructions of the cervical spine were also generated.  COMPARISON:  03/01/2015 brain MRI and head CT, C-spine CT 12/15/2014  FINDINGS: CT HEAD FINDINGS  Trace right high parietal convexity subarachnoid hemorrhage is identified, image 22. No mass lesion or infarct is identified. Stable degree of cortical volume loss with proportional  ventricular prominence. No midline shift. Orbits and paranasal sinuses are unremarkable. No skull fracture. Right occipital scalp swelling is evident.  CT CERVICAL SPINE FINDINGS  C1 through the cervicothoracic junction is visualized in its entirety. Normal alignment. No precervical soft tissue widening. No fracture or dislocation. Mild inferior cervical spine degenerative change is identified with mild bilateral neural foraminal narrowing, right greater than left, spanning C5-T1. No fracture or dislocation. Emphysematous changes are partly visualized at the lung apices.  IMPRESSION: Acute trace right parietal convexity subarachnoid hemorrhage. No midline shift or mass effect upon the lateral ventricles.  Inferior cervical spine disc degenerative change without focal acute finding.  Critical Value/emergent results were called by telephone at the time of interpretation on 03/17/2015 at 12:27 pm to Dr. Virgel Manifold , who verbally acknowledged these results.   Electronically Signed   By: Conchita Paris M.D.   On: 03/17/2015 12:30   Assessment/Plan: 59 y.o. male with a past medical history that is relevant for alcohol abuse, hepatitis C, liver cirrhosis,chronic thrombocytopenia, SDH 12/13, admitted to the hospital after sustaining a fall resulting in a small right parietal convexity SAH and subsequent development of an isolated GTC seizure witnessed by ED staff. Patient intoxicated with alcohol level >300. He is very drowsy, tremulous, and at high risk for DT, thus CIWA protocol initiated. Isolated GTC could certainly be an early symptomatic seizure in the setting of small posttraumatic SAH, but can not exclude the possibility of provoked alcohol related seizure. He has been started on keppra, but don't think he requires long term treatment with AED. Continue keppra 500 mg BID for now. EEG. Will follow up.  Dorian Pod, MD 03/17/2015, 8:59 PM  [facility]

## 2015-03-17 NOTE — ED Notes (Signed)
Bed: EI35 Expected date: 03/17/15 Expected time: 11:20 AM Means of arrival: Ambulance Comments: ETOH fall/ LSB

## 2015-03-17 NOTE — ED Notes (Signed)
Pt states last drink was a pint of [place] Stalls yesterday

## 2015-03-17 NOTE — H&P (Addendum)
[facility] History and Physical  YACOB WILKERSON VEL:[id number] DOB: [birth year] DOA: 03/17/2015  Referring physician: Dr Wilson Singer.  PCP: Berkley Harvey, NP   Chief Complaint: Fall.   HPI: Noah Medina is a 59 y.o. male with PMH significant for cirrhosis of the liver, hepatitis C, alcohol abuse, chronic thrombocytopenia who presents after a fall. Patient appears to be intoxicated, He relates that last alcohol drink was day prior to admission. He said, he fell backward, doesn't know how the fall happen. He is lethargic after ativan. He had a generalized tonic clonic seizure, witnessed  by staff in the ED. He is able to answer some questions. He denies headaches, chest pain, dyspnea.  Evaluation in the ED:  CT head: Acute trace right parietal convexity subarachnoid hemorrhage. No midline shift or mass effect upon the lateral ventricles. Inferior cervical spine disc degenerative change without focal acute finding. Platelet at 66, Alcohol level at 318.     Review of Systems:  Negative, except as per HPI.   Past Medical History  Diagnosis Date  . Cirrhosis of liver   . Hepatitis C 03-28-12    ? 80's-prev. IV drug abuse  . 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 Blood transfusion 03-28-12    '81-hx. of bleeding ulcer  . History of bleeding ulcers 03-28-12    Hx. '81  . GERD (gastroesophageal reflux disease) 03-26-12    Hx. reflux. ulcers in past, occ. OTC med  . Incisional hernia     has drainage system from abdomin  . Subdural hematoma 12/13/12  . Alcohol abuse   . Homelessness    Past Surgical History  Procedure Laterality Date  . Umbilical hernia repair    . Hernia repair  08/24/11    ventral hernia with mesh  . Hernia repair  25'85    Umbilical  . Peptic ulcer  03-26-12    open peptic ulcer surgery repair- '81  . Incisional hernia repair  03/26/2012  . Incisional hernia repair  03/30/2012    Procedure: LAPAROSCOPIC INCISIONAL HERNIA;  Surgeon:  Harl Bowie, MD;  Location: [facility];  Service: General;  Laterality: N/A;  Laparoscopic Incisional/ventral Hernia Repair with Mesh  . Tonsillectomy    . Appendectomy    . Incisional hernia repair  07/20/2012    mesh  . Incisional hernia repair  07/20/2012    Procedure: HERNIA REPAIR INCISIONAL;  Surgeon: Harl Bowie, MD;  Location: [facility];  Service: General;  Laterality: N/A;  repair of incisional hernia with Biologic mesh   Social History:  reports that he has been smoking Cigarettes.  He has a 9.5 pack-year smoking history. He has never used smokeless tobacco. He reports that he drinks about 75.6 oz of alcohol per week. He reports that he uses illicit drugs (Cocaine, Marijuana, Methamphetamines, and IV).  No Known Allergies  Family History  Problem Relation Age of Onset  . Cancer Mother     breast  . Cancer Father     brain  . Heart disease Brother   . Cancer Sister     breast  . Cancer Sister     breast    Prior to Admission medications   Medication Sig Start Date End Date Taking? Authorizing Provider  atenolol (TENORMIN) 25 MG tablet Take 1 tablet (25 mg total) by mouth daily. Patient not taking: Reported on 03/17/2015 03/07/15   Kerrie Buffalo, NP  dicyclomine (BENTYL) 10 MG capsule Take 1 capsule (10 mg total)  by mouth 3 (three) times daily before meals. Patient not taking: Reported on 03/17/2015 03/08/15   [name], MD  famotidine (PEPCID) 20 MG tablet Take 1 tablet (20 mg total) by mouth 2 (two) times daily. Patient not taking: Reported on 03/17/2015 03/07/15   Kerrie Buffalo, NP  pantoprazole (PROTONIX) 40 MG tablet Take 1 tablet (40 mg total) by mouth daily. Patient not taking: Reported on 03/17/2015 03/07/15   Kerrie Buffalo, NP  traZODone (DESYREL) 50 MG tablet Take 1 tablet (50 mg total) by mouth at bedtime as needed for sleep. Patient not taking: Reported on 03/17/2015 03/07/15   Kerrie Buffalo, NP   Physical Exam: Filed Vitals:   03/17/15 1124 03/17/15 1230  03/17/15 1300 03/17/15 1330  BP: 147/55 145/59 166/60 132/61  Pulse: 76 80  68  Temp: 97.6 F (36.4 C)     TempSrc: Oral     Resp: 17 24 17 14   SpO2: 97% 98%  99%    Wt Readings from Last 3 Encounters:  03/17/15 92.987 kg (205 lb)  03/01/15 89.812 kg (198 lb)  06/10/14 89.812 kg (198 lb)    General:  Appears calm and comfortable, lethargic, wake up answer questions.  Eyes: PERRL, normal lids, irises & conjunctiva ENT: grossly normal hearing, lips & tongue Neck: no LAD, masses or thyromegaly Cardiovascular: RRR, no m/r/g. No LE edema. Telemetry: SR, no arrhythmias  Respiratory: CTA bilaterally, no w/r/r. Normal respiratory effort. Abdomen: soft, ntnd Skin: no rash or induration seen on limited exam Musculoskeletal: grossly normal tone BUE/BLE Neurologic: Patient is lethargic, post ictal and after ativan. He open eyes, answer questions, follow command, neuro exam appears non focal.           Labs on Admission:  Basic Metabolic Panel:  Recent Labs Lab 03/17/15 0630 03/17/15 1244  NA 143 144  K 3.8 4.1  CL 111 111  CO2 24 23  GLUCOSE 111* 89  BUN 12 13  CREATININE 1.00 0.90  CALCIUM 8.1* 8.1*   Liver Function Tests: No results for input(s): AST, ALT, ALKPHOS, BILITOT, PROT, ALBUMIN in the last 168 hours. No results for input(s): LIPASE, AMYLASE in the last 168 hours. No results for input(s): AMMONIA in the last 168 hours. CBC:  Recent Labs Lab 03/17/15 0630 03/17/15 1244  WBC 4.8 4.9  NEUTROABS  --  2.7  HGB 12.6* 12.6*  HCT 35.5* 35.4*  MCV 93.9 95.9  PLT 57* 66*   Cardiac Enzymes: No results for input(s): CKTOTAL, CKMB, CKMBINDEX, TROPONINI in the last 168 hours.  BNP (last 3 results)  Recent Labs  03/17/15 0630  BNP 43.7    ProBNP (last 3 results) No results for input(s): PROBNP in the last 8760 hours.  CBG: No results for input(s): GLUCAP in the last 168 hours.  Radiological Exams on Admission: Dg Chest 2 View  03/17/2015   CLINICAL  DATA:  Chest pain shortness of Breath  EXAM: CHEST  2 VIEW  COMPARISON:  03/08/2015  FINDINGS: The heart size and mediastinal contours are within normal limits. Both lungs are clear. The visualized skeletal structures are unremarkable. Postsurgical changes are noted about the gastroesophageal junction.  IMPRESSION: No active cardiopulmonary disease.   Electronically Signed   By: Inez Catalina M.D.   On: 03/17/2015 07:25   Ct Head Wo Contrast  03/17/2015   CLINICAL DATA:  Intoxication, fall, occipital head trauma  EXAM: CT HEAD WITHOUT CONTRAST  CT CERVICAL SPINE WITHOUT CONTRAST  TECHNIQUE: Multidetector CT imaging of the  head and cervical spine was performed following the standard protocol without intravenous contrast. Multiplanar CT image reconstructions of the cervical spine were also generated.  COMPARISON:  03/01/2015 brain MRI and head CT, C-spine CT 12/15/2014  FINDINGS: CT HEAD FINDINGS  Trace right high parietal convexity subarachnoid hemorrhage is identified, image 22. No mass lesion or infarct is identified. Stable degree of cortical volume loss with proportional ventricular prominence. No midline shift. Orbits and paranasal sinuses are unremarkable. No skull fracture. Right occipital scalp swelling is evident.  CT CERVICAL SPINE FINDINGS  C1 through the cervicothoracic junction is visualized in its entirety. Normal alignment. No precervical soft tissue widening. No fracture or dislocation. Mild inferior cervical spine degenerative change is identified with mild bilateral neural foraminal narrowing, right greater than left, spanning C5-T1. No fracture or dislocation. Emphysematous changes are partly visualized at the lung apices.  IMPRESSION: Acute trace right parietal convexity subarachnoid hemorrhage. No midline shift or mass effect upon the lateral ventricles.  Inferior cervical spine disc degenerative change without focal acute finding.  Critical Value/emergent results were called by telephone at the  time of interpretation on 03/17/2015 at 12:27 pm to Dr. Virgel Manifold , who verbally acknowledged these results.   Electronically Signed   By: Conchita Paris M.D.   On: 03/17/2015 12:30   Ct Cervical Spine Wo Contrast  03/17/2015   CLINICAL DATA:  Intoxication, fall, occipital head trauma  EXAM: CT HEAD WITHOUT CONTRAST  CT CERVICAL SPINE WITHOUT CONTRAST  TECHNIQUE: Multidetector CT imaging of the head and cervical spine was performed following the standard protocol without intravenous contrast. Multiplanar CT image reconstructions of the cervical spine were also generated.  COMPARISON:  03/01/2015 brain MRI and head CT, C-spine CT 12/15/2014  FINDINGS: CT HEAD FINDINGS  Trace right high parietal convexity subarachnoid hemorrhage is identified, image 22. No mass lesion or infarct is identified. Stable degree of cortical volume loss with proportional ventricular prominence. No midline shift. Orbits and paranasal sinuses are unremarkable. No skull fracture. Right occipital scalp swelling is evident.  CT CERVICAL SPINE FINDINGS  C1 through the cervicothoracic junction is visualized in its entirety. Normal alignment. No precervical soft tissue widening. No fracture or dislocation. Mild inferior cervical spine degenerative change is identified with mild bilateral neural foraminal narrowing, right greater than left, spanning C5-T1. No fracture or dislocation. Emphysematous changes are partly visualized at the lung apices.  IMPRESSION: Acute trace right parietal convexity subarachnoid hemorrhage. No midline shift or mass effect upon the lateral ventricles.  Inferior cervical spine disc degenerative change without focal acute finding.  Critical Value/emergent results were called by telephone at the time of interpretation on 03/17/2015 at 12:27 pm to Dr. Virgel Manifold , who verbally acknowledged these results.   Electronically Signed   By: Conchita Paris M.D.   On: 03/17/2015 12:30    EKG: Independently reviewed. Sinus  rythm.   Assessment/Plan Active Problems:   Cirrhosis of liver with history of hepatitis C   Fall   Thrombocytopenia   Alcohol intoxication   SAH (subarachnoid hemorrhage)  1-SAH;  Admit to step down unit.  Need repeat CT head in 24 hours.  Dr Joya Salm will see patient in consultation. No need for platelet transfusion at this time. Discussed with Dr Joya Salm.  INR, PTT pending.  Transfer to [facility].  Avoid anticoagulation.   2-Seizure: patient had generalized tonic clonic seizure, witness by staff in the ED. Marland Kitchen Alcohol level was at 318.  Discussed with Dr Doy Mince, will load patient with  1 gr of keppra. Neurologist will see in consultation.  Follow up further neurology recommendation.   3-Alcohol, abuse: Will order CIWA protocol.   4-Cirrhosis of liver with history of hepatitis C;   5-Chronic Thrombocytopenia; secondary to cirrhosis, alcohol abuse.  follow trend.   Code Status: Full Code.  DVT Prophylaxis:SCD,no anticoagulation in thesetting of SAH,  Family Communication: no family at bedside,. Disposition Plan: expect 3 to 4 days inpatient  Time spent: 75 minutes.   Niel Hummer A [facility] Pager [phone]

## 2015-03-17 NOTE — ED Notes (Signed)
Report given to [facility]  

## 2015-03-17 NOTE — ED Notes (Signed)
Patient transported to X-ray 

## 2015-03-17 NOTE — ED Notes (Signed)
MD at bedside. 

## 2015-03-17 NOTE — ED Provider Notes (Signed)
CSN: [id number]     Arrival date & time 03/17/15  0607 History   First MD Initiated Contact with Patient 03/17/15 [phone]     Chief Complaint  Patient presents with  . Chest Pain     (Consider location/radiation/quality/duration/timing/severity/associated sxs/prior Treatment) HPI Comments: Patient with a history of Alcohol Abuse, Cirrhosis of the Liver, Homelessness, presents today with a chief complaint of chest pain.  He reports onset of chest pain at 4 PM yesterday.  Pain has been intermittent since that time.  Pain left anterior chest and does not radiate. He is currently intoxicated and unable to further characterize the pain.  He was offered ASA and NG by EMS en route, but declined.  He states, "I don't even need to be here.  I told them not to bring me here."  He denies nausea, vomiting, cough, numbness, tingling, diaphoresis, LE edema.  No history of PE or DVT.  No prolonged travel or surgeries in the past 4 weeks.  No prior cardiac history.  No history of HTN, Hyperlipidemia, or DM.  He does smoke.  The history is provided by the patient.    Past Medical History  Diagnosis Date  . Cirrhosis of liver   . Hepatitis C 03-28-12    ? 80's-prev. IV drug abuse  . 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 Blood transfusion 03-28-12    '81-hx. of bleeding ulcer  . History of bleeding ulcers 03-28-12    Hx. '81  . GERD (gastroesophageal reflux disease) 03-26-12    Hx. reflux. ulcers in past, occ. OTC med  . Incisional hernia     has drainage system from abdomin  . Subdural hematoma 12/13/12  . Alcohol abuse   . Homelessness    Past Surgical History  Procedure Laterality Date  . Umbilical hernia repair    . Hernia repair  08/24/11    ventral hernia with mesh  . Hernia repair  67'67    Umbilical  . Peptic ulcer  03-26-12    open peptic ulcer surgery repair- '81  . Incisional hernia repair  03/26/2012  . Incisional hernia repair  03/30/2012    Procedure:  LAPAROSCOPIC INCISIONAL HERNIA;  Surgeon: Harl Bowie, MD;  Location: [facility];  Service: General;  Laterality: N/A;  Laparoscopic Incisional/ventral Hernia Repair with Mesh  . Tonsillectomy    . Appendectomy    . Incisional hernia repair  07/20/2012    mesh  . Incisional hernia repair  07/20/2012    Procedure: HERNIA REPAIR INCISIONAL;  Surgeon: Harl Bowie, MD;  Location: [facility];  Service: General;  Laterality: N/A;  repair of incisional hernia with Biologic mesh   Family History  Problem Relation Age of Onset  . Cancer Mother     breast  . Cancer Father     brain  . Heart disease Brother   . Cancer Sister     breast  . Cancer Sister     breast   History  Substance Use Topics  . Smoking status: Current Every Day Smoker -- 0.25 packs/day for 38 years    Types: Cigarettes  . Smokeless tobacco: Never Used  . Alcohol Use: 75.6 oz/week    126 Cans of beer per week    Review of Systems  All other systems reviewed and are negative.     Allergies  Review of patient's allergies indicates no known allergies.  Home Medications   Prior to Admission medications   Medication Sig Start  Date End Date Taking? Authorizing Provider  atenolol (TENORMIN) 25 MG tablet Take 1 tablet (25 mg total) by mouth daily. 03/07/15   Kerrie Buffalo, NP  dicyclomine (BENTYL) 10 MG capsule Take 1 capsule (10 mg total) by mouth 3 (three) times daily before meals. 03/08/15   [name], MD  famotidine (PEPCID) 20 MG tablet Take 1 tablet (20 mg total) by mouth 2 (two) times daily. 03/07/15   Kerrie Buffalo, NP  pantoprazole (PROTONIX) 40 MG tablet Take 1 tablet (40 mg total) by mouth daily. 03/07/15   Kerrie Buffalo, NP  traZODone (DESYREL) 50 MG tablet Take 1 tablet (50 mg total) by mouth at bedtime as needed for sleep. 03/07/15   Kerrie Buffalo, NP   BP 141/62 mmHg  Pulse 77  Temp(Src) 98.6 F (37 C) (Oral)  Resp 16  Ht 5\' 11"  (1.803 m)  Wt 205 lb (92.987 kg)  BMI 28.60 kg/m2  SpO2  98% Physical Exam  Constitutional: He appears well-developed and well-nourished.  HENT:  Head: Normocephalic and atraumatic.  Neck: Normal range of motion. Neck supple.  Cardiovascular: Normal rate, regular rhythm and normal heart sounds.   Pulses:      Dorsalis pedis pulses are 2+ on the right side, and 2+ on the left side.  Pulmonary/Chest: Effort normal and breath sounds normal.  Abdominal: Soft. There is no tenderness.   Midline scar noted, multiple ventral hernias, soft and easily reducible.  No tenderness to palpation of the hernias.   Musculoskeletal:  No LE edema or erythema  Neurological: He is alert.  Skin: Skin is warm and dry.  Psychiatric: He has a normal mood and affect.  Nursing note and vitals reviewed.   ED Course  Procedures (including critical care time) Labs Review Labs Reviewed  [place], ED    Imaging Review    EKG Interpretation   Date/Time:  Sunday March 17 2015 06:11:41 EDT Ventricular Rate:  82 PR Interval:  160 QRS Duration: 102 QT Interval:  395 QTC Calculation: 461 R Axis:   66 Text Interpretation:  Age not entered, assumed to be  59 years old for  purpose of ECG interpretation Sinus rhythm No significant change since  last tracing Confirmed by WARD,  DO, KRISTEN ([id number]) on 03/17/2015 6:30:38  AM     7:30 AM Patient left the ED AMA.  According to Nursing Staff patient was ambulatory upon leaving the ED.  Patient explained the risks of leaving the ED.   MDM   Final diagnoses:  Chest pain  SOB (shortness of breath)   Patient presents today with chest pain that has been present since yesterday afternoon.  No ischemic changes on EKG.  Troponin is negative.  Labs unremarkable.  CXR is negative.  Patient is intoxicated, however, he is ambulatory.  No slurred speech.  Patient left the ED AMA.    Hyman Bible, PA-C 03/17/15 [place], DO 03/19/15 1497

## 2015-03-17 NOTE — Progress Notes (Addendum)
Pt arrived to unit accompanied by [facility] RN. Pt oriented to room/unit. VS stable. CIWA score of 19, pt only has med-surg CIWA order set. Dr. Sherral Hammers (nightshift coverage) paged to get CIWA order set changed to step-down order set. Awaiting call back. Admitting MD, Dr. Tyrell Antonio also paged.  Received call back from Dr. Sherral Hammers with orders to change CIWA order set to stepdown orders. Will administer ativan per order set.

## 2015-03-17 NOTE — ED Notes (Signed)
Pt had witnessed seizure (absence) that lasted for a minute, resolved spontaneously. Ativan given IM

## 2015-03-17 NOTE — ED Provider Notes (Signed)
CSN: [id number]     Arrival date & time 03/17/15  1119 History   First MD Initiated Contact with Patient 03/17/15 1134     Chief Complaint  Patient presents with  . Fall  . Alcohol Intoxication     (Consider location/radiation/quality/duration/timing/severity/associated sxs/prior Treatment) HPI   58yM presenting after fall. Brought in by EMS with c-collar and on long spine board. Pt appears intoxicated. Says he fell, but cannot tell me exact circumstances. Alcoholic. Last drink sometime yesterday. Pt loud and belligerent. Yelling repeatedly to have his c-collar taken off. Not interested in help for ETOH abuse. "I ain't going to no fucking meetings." Per review of records, pt was discharged from [facility] this morning after presenting with CP.   Past Medical History  Diagnosis Date  . Cirrhosis of liver   . Hepatitis C 03-28-12    ? 80's-prev. IV drug abuse  . 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 Blood transfusion 03-28-12    '81-hx. of bleeding ulcer  . History of bleeding ulcers 03-28-12    Hx. '81  . GERD (gastroesophageal reflux disease) 03-26-12    Hx. reflux. ulcers in past, occ. OTC med  . Incisional hernia     has drainage system from abdomin  . Subdural hematoma 12/13/12  . Alcohol abuse   . Homelessness    Past Surgical History  Procedure Laterality Date  . Umbilical hernia repair    . Hernia repair  08/24/11    ventral hernia with mesh  . Hernia repair  46'96    Umbilical  . Peptic ulcer  03-26-12    open peptic ulcer surgery repair- '81  . Incisional hernia repair  03/26/2012  . Incisional hernia repair  03/30/2012    Procedure: LAPAROSCOPIC INCISIONAL HERNIA;  Surgeon: Harl Bowie, MD;  Location: [facility];  Service: General;  Laterality: N/A;  Laparoscopic Incisional/ventral Hernia Repair with Mesh  . Tonsillectomy    . Appendectomy    . Incisional hernia repair  07/20/2012    mesh  . Incisional hernia repair  07/20/2012    Procedure:  HERNIA REPAIR INCISIONAL;  Surgeon: Harl Bowie, MD;  Location: [facility];  Service: General;  Laterality: N/A;  repair of incisional hernia with Biologic mesh   Family History  Problem Relation Age of Onset  . Cancer Mother     breast  . Cancer Father     brain  . Heart disease Brother   . Cancer Sister     breast  . Cancer Sister     breast   History  Substance Use Topics  . Smoking status: Current Every Day Smoker -- 0.25 packs/day for 38 years    Types: Cigarettes  . Smokeless tobacco: Never Used  . Alcohol Use: 75.6 oz/week    126 Cans of beer per week    Review of Systems  All systems reviewed and negative, other than as noted in HPI.   Allergies  Review of patient's allergies indicates no known allergies.  Home Medications   Prior to Admission medications   Medication Sig Start Date End Date Taking? Authorizing Provider  atenolol (TENORMIN) 25 MG tablet Take 1 tablet (25 mg total) by mouth daily. Patient not taking: Reported on 03/17/2015 03/07/15   Kerrie Buffalo, NP  dicyclomine (BENTYL) 10 MG capsule Take 1 capsule (10 mg total) by mouth 3 (three) times daily before meals. Patient not taking: Reported on 03/17/2015 03/08/15   [name], MD  famotidine (PEPCID) 20 MG tablet Take 1 tablet (20 mg total) by mouth 2 (two) times daily. Patient not taking: Reported on 03/17/2015 03/07/15   Kerrie Buffalo, NP  pantoprazole (PROTONIX) 40 MG tablet Take 1 tablet (40 mg total) by mouth daily. Patient not taking: Reported on 03/17/2015 03/07/15   Kerrie Buffalo, NP  traZODone (DESYREL) 50 MG tablet Take 1 tablet (50 mg total) by mouth at bedtime as needed for sleep. Patient not taking: Reported on 03/17/2015 03/07/15   Kerrie Buffalo, NP   BP 145/59 mmHg  Pulse 80  Temp(Src) 97.6 F (36.4 C) (Oral)  Resp 24  SpO2 98% Physical Exam  Constitutional: He appears well-developed and well-nourished. No distress.  HENT:  Head: Normocephalic and atraumatic.  No external signs  of acute trauma  Eyes: Conjunctivae are normal. Right eye exhibits no discharge. Left eye exhibits no discharge.  Neck: Neck supple.  Cardiovascular: Normal rate, regular rhythm and normal heart sounds.  Exam reveals no gallop and no friction rub.   No murmur heard. Pulmonary/Chest: Effort normal and breath sounds normal. No respiratory distress.  Abdominal: Soft. He exhibits no distension. There is no tenderness.  Musculoskeletal: He exhibits no edema or tenderness.  Neurological: He is alert.  Disoriented to time. Slurred speech. General psychomotor slowing consistent with alcohol intoxication. No focal motor deficit. CN intact.   Skin: Skin is warm and dry.  Nursing note and vitals reviewed.   ED Course  Procedures (including critical care time)  CRITICAL CARE Performed by: Virgel Manifold Total critical care time: 35 minutes Critical care time was exclusive of separately billable procedures and treating other patients. Critical care was necessary to treat or prevent imminent or life-threatening deterioration. Critical care was time spent personally by me on the following activities: development of treatment plan with patient and/or surrogate as well as nursing, discussions with consultants, evaluation of patient's response to treatment, examination of patient, obtaining history from patient or surrogate, ordering and performing treatments and interventions, ordering and review of laboratory studies, ordering and review of radiographic studies, pulse oximetry and re-evaluation of patient's condition.  Labs Review Labs Reviewed  MRSA PCR SCREENING - Abnormal; Notable for the following:    MRSA by PCR POSITIVE (*)    All other components within normal limits  CBC WITH DIFFERENTIAL/PLATELET - Abnormal; Notable for the following:    RBC 3.69 (*)    Hemoglobin 12.6 (*)    HCT 35.4 (*)    MCH 34.1 (*)    RDW 15.7 (*)    Platelets 66 (*)    All other components within normal limits   BASIC METABOLIC PANEL - Abnormal; Notable for the following:    Calcium 8.1 (*)    All other components within normal limits  ETHANOL - Abnormal; Notable for the following:    Alcohol, Ethyl (B) 318 (*)    All other components within normal limits  PROTIME-INR - Abnormal; Notable for the following:    Prothrombin Time 17.8 (*)    All other components within normal limits  CBC - Abnormal; Notable for the following:    WBC 3.9 (*)    RBC 3.50 (*)    Hemoglobin 11.7 (*)    HCT 33.7 (*)    RDW 15.8 (*)    Platelets 48 (*)    All other components within normal limits  COMPREHENSIVE METABOLIC PANEL - Abnormal; Notable for the following:    Sodium 146 (*)    Chloride 114 (*)  Glucose, Bld 66 (*)    Calcium 7.8 (*)    Total Protein 5.8 (*)    Albumin 2.6 (*)    AST 90 (*)    Total Bilirubin 1.8 (*)    GFR calc non Af Amer 89 (*)    All other components within normal limits  PROTIME-INR - Abnormal; Notable for the following:    Prothrombin Time 18.5 (*)    INR 1.53 (*)    All other components within normal limits  CBC - Abnormal; Notable for the following:    WBC 2.7 (*)    RBC 3.31 (*)    Hemoglobin 11.1 (*)    HCT 31.8 (*)    Platelets 44 (*)    All other components within normal limits  BASIC METABOLIC PANEL - Abnormal; Notable for the following:    Potassium 3.4 (*)    Glucose, Bld 116 (*)    Calcium 7.7 (*)    GFR calc non Af Amer 74 (*)    GFR calc Af Amer 86 (*)    Anion gap 1 (*)    All other components within normal limits  AMMONIA - Abnormal; Notable for the following:    Ammonia 67 (*)    All other components within normal limits  APTT  APTT    Imaging Review Dg Chest 2 View  03/17/2015   CLINICAL DATA:  Chest pain shortness of Breath  EXAM: CHEST  2 VIEW  COMPARISON:  03/08/2015  FINDINGS: The heart size and mediastinal contours are within normal limits. Both lungs are clear. The visualized skeletal structures are unremarkable. Postsurgical changes are  noted about the gastroesophageal junction.  IMPRESSION: No active cardiopulmonary disease.   Electronically Signed   By: Inez Catalina M.D.   On: 03/17/2015 07:25   Ct Head Wo Contrast  03/17/2015   CLINICAL DATA:  Intoxication, fall, occipital head trauma  EXAM: CT HEAD WITHOUT CONTRAST  CT CERVICAL SPINE WITHOUT CONTRAST  TECHNIQUE: Multidetector CT imaging of the head and cervical spine was performed following the standard protocol without intravenous contrast. Multiplanar CT image reconstructions of the cervical spine were also generated.  COMPARISON:  03/01/2015 brain MRI and head CT, C-spine CT 12/15/2014  FINDINGS: CT HEAD FINDINGS  Trace right high parietal convexity subarachnoid hemorrhage is identified, image 22. No mass lesion or infarct is identified. Stable degree of cortical volume loss with proportional ventricular prominence. No midline shift. Orbits and paranasal sinuses are unremarkable. No skull fracture. Right occipital scalp swelling is evident.  CT CERVICAL SPINE FINDINGS  C1 through the cervicothoracic junction is visualized in its entirety. Normal alignment. No precervical soft tissue widening. No fracture or dislocation. Mild inferior cervical spine degenerative change is identified with mild bilateral neural foraminal narrowing, right greater than left, spanning C5-T1. No fracture or dislocation. Emphysematous changes are partly visualized at the lung apices.  IMPRESSION: Acute trace right parietal convexity subarachnoid hemorrhage. No midline shift or mass effect upon the lateral ventricles.  Inferior cervical spine disc degenerative change without focal acute finding.  Critical Value/emergent results were called by telephone at the time of interpretation on 03/17/2015 at 12:27 pm to Dr. Virgel Manifold , who verbally acknowledged these results.   Electronically Signed   By: Conchita Paris M.D.   On: 03/17/2015 12:30   Ct Cervical Spine Wo Contrast  03/17/2015   CLINICAL DATA:   Intoxication, fall, occipital head trauma  EXAM: CT HEAD WITHOUT CONTRAST  CT CERVICAL SPINE WITHOUT CONTRAST  TECHNIQUE:  Multidetector CT imaging of the head and cervical spine was performed following the standard protocol without intravenous contrast. Multiplanar CT image reconstructions of the cervical spine were also generated.  COMPARISON:  03/01/2015 brain MRI and head CT, C-spine CT 12/15/2014  FINDINGS: CT HEAD FINDINGS  Trace right high parietal convexity subarachnoid hemorrhage is identified, image 22. No mass lesion or infarct is identified. Stable degree of cortical volume loss with proportional ventricular prominence. No midline shift. Orbits and paranasal sinuses are unremarkable. No skull fracture. Right occipital scalp swelling is evident.  CT CERVICAL SPINE FINDINGS  C1 through the cervicothoracic junction is visualized in its entirety. Normal alignment. No precervical soft tissue widening. No fracture or dislocation. Mild inferior cervical spine degenerative change is identified with mild bilateral neural foraminal narrowing, right greater than left, spanning C5-T1. No fracture or dislocation. Emphysematous changes are partly visualized at the lung apices.  IMPRESSION: Acute trace right parietal convexity subarachnoid hemorrhage. No midline shift or mass effect upon the lateral ventricles.  Inferior cervical spine disc degenerative change without focal acute finding.  Critical Value/emergent results were called by telephone at the time of interpretation on 03/17/2015 at 12:27 pm to Dr. Virgel Manifold , who verbally acknowledged these results.   Electronically Signed   By: Conchita Paris M.D.   On: 03/17/2015 12:30     EKG Interpretation None      MDM   Final diagnoses:  SAH (subarachnoid hemorrhage)  Alcohol abuse  Fall, initial encounter  Seizure   58yM presenting after fall. No specific complaints, but clinically intoxicated and imaging done. Small R subarachnoid blood. Pt speaking  and follows commands on arrival. Subsequently had seizure. I suspect this is more likely from etoh withdrawal and not from trauma. Amount of blood noted is fairly minimal. Pt just left ED at [facility] this morning. Says last drink was sometime yesterday. Will discuss with neurosurgery. Anticipate admission overnight for observation.   Discussed with Dr Joya Salm, neurosurgery. No acute intervention. If was lucid and had responsible family/friends that could stay with him, he could conceivably go home. He is a homeless, currently intoxicated and also had a seizure in ED. Will admit for observation.   Virgel Manifold, MD 03/19/15 [phone]

## 2015-03-17 NOTE — ED Notes (Signed)
Per EMS. Pt states he fell backwards this am and fell. Complaining of lower back pain. Pt states he hit head, no injury noted. Pt has been drinking etoh.

## 2015-03-17 NOTE — ED Notes (Signed)
Patient transported to CT 

## 2015-03-18 ENCOUNTER — Inpatient Hospital Stay (HOSPITAL_COMMUNITY): Payer: Medicaid Other

## 2015-03-18 DIAGNOSIS — F1010 Alcohol abuse, uncomplicated: Secondary | ICD-10-CM

## 2015-03-18 DIAGNOSIS — F10120 Alcohol abuse with intoxication, uncomplicated: Secondary | ICD-10-CM

## 2015-03-18 LAB — COMPREHENSIVE METABOLIC PANEL
ALT: 37 U/L (ref 0–53)
AST: 90 U/L — AB (ref 0–37)
Albumin: 2.6 g/dL — ABNORMAL LOW (ref 3.5–5.2)
Alkaline Phosphatase: 56 U/L (ref 39–117)
Anion gap: 7 (ref 5–15)
BUN: 11 mg/dL (ref 6–23)
CALCIUM: 7.8 mg/dL — AB (ref 8.4–10.5)
CO2: 25 mmol/L (ref 19–32)
CREATININE: 0.97 mg/dL (ref 0.50–1.35)
Chloride: 114 mmol/L — ABNORMAL HIGH (ref 96–112)
GFR, EST NON AFRICAN AMERICAN: 89 mL/min — AB (ref >90)
GLUCOSE: 66 mg/dL — AB (ref 70–99)
Potassium: 3.8 mmol/L (ref 3.5–5.1)
Sodium: 146 mmol/L — ABNORMAL HIGH (ref 135–145)
Total Bilirubin: 1.8 mg/dL — ABNORMAL HIGH (ref 0.3–1.2)
Total Protein: 5.8 g/dL — ABNORMAL LOW (ref 6.0–8.3)

## 2015-03-18 LAB — PROTIME-INR
INR: 1.53 — AB (ref 0.00–1.49)
PROTHROMBIN TIME: 18.5 s — AB (ref 11.6–15.2)

## 2015-03-18 LAB — CBC
HEMATOCRIT: 33.7 % — AB (ref 39.0–52.0)
Hemoglobin: 11.7 g/dL — ABNORMAL LOW (ref 13.0–17.0)
MCH: 33.4 pg (ref 26.0–34.0)
MCHC: 34.7 g/dL (ref 30.0–36.0)
MCV: 96.3 fL (ref 78.0–100.0)
PLATELETS: 48 10*3/uL — AB (ref 150–400)
RBC: 3.5 MIL/uL — ABNORMAL LOW (ref 4.22–5.81)
RDW: 15.8 % — AB (ref 11.5–15.5)
WBC: 3.9 10*3/uL — AB (ref 4.0–10.5)

## 2015-03-18 LAB — APTT: aPTT: 35 seconds (ref 24–37)

## 2015-03-18 MED ORDER — NICOTINE 21 MG/24HR TD PT24
21.0000 mg | MEDICATED_PATCH | Freq: Every day | TRANSDERMAL | Status: DC
  Administered 2015-03-19: 21 mg via TRANSDERMAL
  Filled 2015-03-18 (×3): qty 1

## 2015-03-18 MED ORDER — CHLORHEXIDINE GLUCONATE CLOTH 2 % EX PADS
6.0000 | MEDICATED_PAD | Freq: Every day | CUTANEOUS | Status: DC
  Administered 2015-03-18 – 2015-03-20 (×3): 6 via TOPICAL

## 2015-03-18 MED ORDER — NICOTINE 7 MG/24HR TD PT24
7.0000 mg | MEDICATED_PATCH | Freq: Every day | TRANSDERMAL | Status: DC
  Administered 2015-03-18: 7 mg via TRANSDERMAL
  Filled 2015-03-18: qty 1

## 2015-03-18 MED ORDER — MUPIROCIN 2 % EX OINT
1.0000 "application " | TOPICAL_OINTMENT | Freq: Two times a day (BID) | CUTANEOUS | Status: DC
  Administered 2015-03-18 – 2015-03-20 (×5): 1 via NASAL
  Filled 2015-03-18 (×2): qty 22

## 2015-03-18 NOTE — Progress Notes (Addendum)
Subjective: No further seizures over night.  Currently having HA.    Exam: Filed Vitals:   03/18/15 0820  BP: 116/61  Pulse: 88  Temp: 98.8 F (37.1 C)  Resp: 20   Gen: In bed, NAD MS: Alert and oriented to year and hospital. Able to follow commands.  CN: II-XII intact Motor: moving all extremities antigravity with good strength.  Shows both a postural and action tremor Sensory: intact DTR:2+ throughout  Pertinent Labs: AST 90 Albumin 2.6 WBC 3.9 PLT 48 INR 1.53  Noah Quill PA-C [facility] [phone]  03/18/2015, 9:28 AM   I have seen and evaluated the patient. I have reviewed the above note and made appropriate changes.     Impression: 59 y.o. male with a past medical history that is relevant for alcohol abuse, hepatitis C, liver cirrhosis,chronic thrombocytopenia, SDH 12/13, admitted to the hospital after sustaining a fall resulting in a small right parietal convexity SAH and subsequent development of an isolated GTC seizure witnessed by ED staff. CT head shows acute right parietal SAH. Seizure possibly secondary to [facility].  Recommendations: 1) Agree with keppra in the short term, likely will not need it long term.  2) agree with withdrawal precautions given his high EtOH intake.  3) EEG 4) Will continue to follow.    [name], MD [facility] [phone]  If 7pm- 7am, please page neurology on call as listed in [place].

## 2015-03-18 NOTE — Progress Notes (Signed)
EEG completed, results pending. 

## 2015-03-18 NOTE — Progress Notes (Signed)
[facility] PROGRESS NOTE  Noah Medina OHY:[id number] DOB: Mar 15, 1956 DOA: 03/17/2015 PCP: Berkley Harvey, NP Interim summary: 59 year old male with h/o liver cirrhosis, hepatitis C, alcohol abuse, chronic thrombocytopenia presents after a fall. He was found to have [place]. He was admitted to step down bed in [facility]. Neuro surgery and neurology consulted for recommendations. EEG done and did not show any epileptiform activity.   Assessment/Plan: 1. Sub arachnoid hemorrhage: on CT head.  From traumatic injury.  No midline shift or mass effect upon the lateral ventricles. Neuro surgery consulted and recommendations given.  He is alert and appears to be oriented to place an dperson only. No new deficits noted.    Generalized tonic clonic seizure: Witnessed by staff in ED. Probably from alcohol intoxication. Neurology consulted and he was started on keppra. EEG done and did not show any epileptiform activity. Alcohol withdrawal precautions on board. Appreciate neurology recommendations.    H/o hepatitis C and liver cirrhosis: liver cirrhosis probably from the lhep c and alcohol abuse.  Monitor. Get ammonia levels in am.    Anemia : Mild normocytic.  Get b12 and folate levels.    Chronic thrombocytopenia: Probably from liver dysfunction.    Hypernatremia: - mild, from free fluid deficit.       Code Status: full code.  Family Communication: none at bedside Disposition Plan: pending   Consultants: Neurology Neuro surgery Procedures:  CT head  EEG  Antibiotics:  NONE  HPI/Subjective: Wants to eat.   Objective: Filed Vitals:   03/18/15 1226  BP: 140/72  Pulse: 89  Temp: 98.8 F (37.1 C)  Resp: 23    Intake/Output Summary (Last 24 hours) at 03/18/15 1603 Last data filed at 03/18/15 0700  Gross per 24 hour  Intake   1000 ml  Output    500 ml  Net    500 ml   Filed Weights   03/17/15 1856  Weight: 90.4 kg (199 lb 4.7 oz)     Exam:   General:  Alert afebrile not in any distress, slightly anxious  Cardiovascular: s1s2  Respiratory: clear to ausculation, no wheezing or rhonchi  Abdomen: soft non tender non distended bowel sounds heard  Musculoskeletal: trace pedal edema   Data Reviewed: Basic Metabolic Panel:  Recent Labs Lab 03/17/15 0630 03/17/15 1244 03/18/15 0236  NA 143 144 146*  K 3.8 4.1 3.8  CL 111 111 114*  CO2 24 23 25   GLUCOSE 111* 89 66*  BUN 12 13 11   CREATININE 1.00 0.90 0.97  CALCIUM 8.1* 8.1* 7.8*   Liver Function Tests:  Recent Labs Lab 03/18/15 0236  AST 90*  ALT 37  ALKPHOS 56  BILITOT 1.8*  PROT 5.8*  ALBUMIN 2.6*   No results for input(s): LIPASE, AMYLASE in the last 168 hours. No results for input(s): AMMONIA in the last 168 hours. CBC:  Recent Labs Lab 03/17/15 0630 03/17/15 1244 03/18/15 0236  WBC 4.8 4.9 3.9*  NEUTROABS  --  2.7  --   HGB 12.6* 12.6* 11.7*  HCT 35.5* 35.4* 33.7*  MCV 93.9 95.9 96.3  PLT 57* 66* 48*   Cardiac Enzymes: No results for input(s): CKTOTAL, CKMB, CKMBINDEX, TROPONINI in the last 168 hours. BNP (last 3 results)  Recent Labs  03/17/15 0630  BNP 43.7    ProBNP (last 3 results) No results for input(s): PROBNP in the last 8760 hours.  CBG: No results for input(s): GLUCAP in the last 168 hours.  Recent Results (  from the past 240 hour(s))  MRSA PCR Screening     Status: Abnormal   Collection Time: 03/17/15  7:00 PM  Result Value Ref Range Status   MRSA by PCR POSITIVE (A) NEGATIVE Final    Comment:        The GeneXpert MRSA Assay (FDA approved for NASAL specimens only), is one component of a comprehensive MRSA colonization surveillance program. It is not intended to diagnose MRSA infection nor to guide or monitor treatment for MRSA infections. RESULT CALLED TO, READ BACK BY AND VERIFIED WITH: Loraine Leriche [id number] WILDERK      Studies: Dg Chest 2 View  03/17/2015   CLINICAL DATA:  Chest pain  shortness of Breath  EXAM: CHEST  2 VIEW  COMPARISON:  03/08/2015  FINDINGS: The heart size and mediastinal contours are within normal limits. Both lungs are clear. The visualized skeletal structures are unremarkable. Postsurgical changes are noted about the gastroesophageal junction.  IMPRESSION: No active cardiopulmonary disease.   Electronically Signed   By: Inez Catalina M.D.   On: 03/17/2015 07:25   Ct Head Wo Contrast  03/17/2015   CLINICAL DATA:  Intoxication, fall, occipital head trauma  EXAM: CT HEAD WITHOUT CONTRAST  CT CERVICAL SPINE WITHOUT CONTRAST  TECHNIQUE: Multidetector CT imaging of the head and cervical spine was performed following the standard protocol without intravenous contrast. Multiplanar CT image reconstructions of the cervical spine were also generated.  COMPARISON:  03/01/2015 brain MRI and head CT, C-spine CT 12/15/2014  FINDINGS: CT HEAD FINDINGS  Trace right high parietal convexity subarachnoid hemorrhage is identified, image 22. No mass lesion or infarct is identified. Stable degree of cortical volume loss with proportional ventricular prominence. No midline shift. Orbits and paranasal sinuses are unremarkable. No skull fracture. Right occipital scalp swelling is evident.  CT CERVICAL SPINE FINDINGS  C1 through the cervicothoracic junction is visualized in its entirety. Normal alignment. No precervical soft tissue widening. No fracture or dislocation. Mild inferior cervical spine degenerative change is identified with mild bilateral neural foraminal narrowing, right greater than left, spanning C5-T1. No fracture or dislocation. Emphysematous changes are partly visualized at the lung apices.  IMPRESSION: Acute trace right parietal convexity subarachnoid hemorrhage. No midline shift or mass effect upon the lateral ventricles.  Inferior cervical spine disc degenerative change without focal acute finding.  Critical Value/emergent results were called by telephone at the time of  interpretation on 03/17/2015 at 12:27 pm to Dr. Virgel Manifold , who verbally acknowledged these results.   Electronically Signed   By: Conchita Paris M.D.   On: 03/17/2015 12:30   Ct Cervical Spine Wo Contrast  03/17/2015   CLINICAL DATA:  Intoxication, fall, occipital head trauma  EXAM: CT HEAD WITHOUT CONTRAST  CT CERVICAL SPINE WITHOUT CONTRAST  TECHNIQUE: Multidetector CT imaging of the head and cervical spine was performed following the standard protocol without intravenous contrast. Multiplanar CT image reconstructions of the cervical spine were also generated.  COMPARISON:  03/01/2015 brain MRI and head CT, C-spine CT 12/15/2014  FINDINGS: CT HEAD FINDINGS  Trace right high parietal convexity subarachnoid hemorrhage is identified, image 22. No mass lesion or infarct is identified. Stable degree of cortical volume loss with proportional ventricular prominence. No midline shift. Orbits and paranasal sinuses are unremarkable. No skull fracture. Right occipital scalp swelling is evident.  CT CERVICAL SPINE FINDINGS  C1 through the cervicothoracic junction is visualized in its entirety. Normal alignment. No precervical soft tissue widening. No fracture or dislocation. Mild inferior  cervical spine degenerative change is identified with mild bilateral neural foraminal narrowing, right greater than left, spanning C5-T1. No fracture or dislocation. Emphysematous changes are partly visualized at the lung apices.  IMPRESSION: Acute trace right parietal convexity subarachnoid hemorrhage. No midline shift or mass effect upon the lateral ventricles.  Inferior cervical spine disc degenerative change without focal acute finding.  Critical Value/emergent results were called by telephone at the time of interpretation on 03/17/2015 at 12:27 pm to Dr. Virgel Manifold , who verbally acknowledged these results.   Electronically Signed   By: Conchita Paris M.D.   On: 03/17/2015 12:30    Scheduled Meds: . Chlorhexidine Gluconate  Cloth  6 each Topical Q0600  . folic acid  1 mg Oral Daily  . levETIRAcetam  500 mg Intravenous Q12H  . multivitamin with minerals  1 tablet Oral Daily  . mupirocin ointment  1 application Nasal BID  . thiamine  100 mg Oral Daily   Or  . thiamine  100 mg Intravenous Daily   Continuous Infusions: . sodium chloride 1,000 mL (03/17/15 2056)    Active Problems:   Cirrhosis of liver with history of hepatitis C   Fall   Thrombocytopenia   Alcohol intoxication   SAH (subarachnoid hemorrhage)    Time spent: 30 minutes    [facility] Pager [phone] If 7PM-7AM, please contact night-coverage at [email or web address], password [facility] 03/18/2015, 4:03 PM  LOS: 1 day

## 2015-03-18 NOTE — Clinical Social Work Psychosocial (Addendum)
Clinical Social Work Department BRIEF PSYCHOSOCIAL ASSESSMENT 03/18/2015  Patient:  Noah Medina, Noah Medina     Account Number:  [id number]     [place] date:  03/17/2015  Clinical Social Worker:  Noah Medina  Date/Time:  03/18/2015 12:47 PM  Referred by:  RN  Date Referred:  03/18/2015 Referred for  Homelessness   Other Referral:   Interview type:  Patient Other interview type:    PSYCHOSOCIAL DATA Living Status:  OTHER Admitted from facility:   Level of care:   Primary support name:  Noah Medina Primary support relationship to patient:  FRIEND Degree of support available:   Fair Support    CURRENT CONCERNS Current Concerns  Other - See comment   Other Concerns:   The pt did not express concern, however Debroah expressed concerns regarding the pt's health, ETOH use and subborness.    SOCIAL WORK ASSESSMENT / PLAN CSW met the pt and Noah Medina (the mother of his adult child) at bedside. CSW introduced self and purpose of the visit. Pt reported he currently homeless, but lives between the [facility] and on a friends couch. Pt reported that he will go back to [employer]. Noah Medina attempted to encourage the pt to receive help with his drinking. Pt denied having a problem with drinking. Noah Medina reported feelings of stress because she tries to help the pt. The pt and Noah Medina began to argue. The pt reported being an adult who will make his own decisions. Noah Medina pleased with the pt to go to [employer]. Noah Medina requested time to talk to the pt along. CSW left the substance abuse resources with Noah Medina.   Assessment/plan status:  Referral to [employer] Other assessment/ plan:  SBIRT completed  Information/referral to community resources:   [facility] [place] [facility]  and the [employer]    PATIENT'S/FAMILY'S RESPONSE TO PLAN OF CARE: Pt is agreeable to going back to [employer].     [place], MSW, [place]

## 2015-03-18 NOTE — Progress Notes (Signed)
Patient ID: Noah Medina, male   DOB: 11-Aug-1956, 59 y.o.   MRN: [id number] Patient seen. Note to follow. No need for surgery

## 2015-03-18 NOTE — Procedures (Signed)
ELECTROENCEPHALOGRAM REPORT  Patient: Noah Medina       Room #: 3G64 EEG No. ID:  Age: 59 y.o.        Sex: male Referring Physician: Venia Minks Report Date:  03/18/2015        Interpreting Physician: Anthony Sar  History: [name] is an 59 y.o. male with a history of alcohol abuse, hepatitis C and liver cirrhosis who was admitted with a closed head injury with a small subarachnoid hemorrhage. He had witnessed generalized seizure in the emergency room, and had a blood alcohol level of 318.  Indications for study:  Rule out seizure disorder.  Technique: This is an 18 channel routine scalp EEG performed at the bedside with bipolar and monopolar montages arranged in accordance to the international 10/20 system of electrode placement.   Description: This EEG was corded during wakefulness and during brief periods of sleep. Predominant activity during wakefulness consisted of diffuse low amplitude fast beta activity as well as occasional brief runs of symmetrical alpha activity recorded from the posterior head region. Photic stimulation and hyperventilation were not performed. During brief. His sleep though was slowing of cerebral activity diffusely with symmetrical sleep spindles and vertex waves recorded. No epileptiform discharges were recorded during wakefulness nor during sleep.  Interpretation: This is a normal EEG recording during wakefulness and during sleep. No evidence of an epileptic disorder was demonstrated. Please be aware, however, that a normal EEG recording, and an of itself, does not rule out an epileptic disorder.   Rush Farmer M.D. [facility] [phone]

## 2015-03-18 NOTE — Progress Notes (Signed)
RN paged this NP secondary to pt wanting to leave the hospital AMA. RN had tried to reason with pt to no avail. NP to bedside. Pt here with SDH and ETOH withdrawal and CIWA has been high and he has/is receiving Ativan often.  NP discussed that I didn't advise leaving and didn't feel right about him leaving due to him having sedative meds. Also, advised him that he should stay until he is cleared to leave due to SDH. After discussion, pt agrees to stay. Clance Boll, NP

## 2015-03-19 ENCOUNTER — Inpatient Hospital Stay (HOSPITAL_COMMUNITY): Payer: Medicaid Other

## 2015-03-19 DIAGNOSIS — S06369D Traumatic hemorrhage of cerebrum, unspecified, with loss of consciousness of unspecified duration, subsequent encounter: Secondary | ICD-10-CM

## 2015-03-19 DIAGNOSIS — I619 Nontraumatic intracerebral hemorrhage, unspecified: Secondary | ICD-10-CM | POA: Insufficient documentation

## 2015-03-19 DIAGNOSIS — K7460 Unspecified cirrhosis of liver: Secondary | ICD-10-CM

## 2015-03-19 LAB — CBC
HCT: 31.8 % — ABNORMAL LOW (ref 39.0–52.0)
HEMOGLOBIN: 11.1 g/dL — AB (ref 13.0–17.0)
MCH: 33.5 pg (ref 26.0–34.0)
MCHC: 34.9 g/dL (ref 30.0–36.0)
MCV: 96.1 fL (ref 78.0–100.0)
Platelets: 44 10*3/uL — ABNORMAL LOW (ref 150–400)
RBC: 3.31 MIL/uL — AB (ref 4.22–5.81)
RDW: 15.3 % (ref 11.5–15.5)
WBC: 2.7 10*3/uL — AB (ref 4.0–10.5)

## 2015-03-19 LAB — BASIC METABOLIC PANEL
ANION GAP: 1 — AB (ref 5–15)
BUN: 13 mg/dL (ref 6–23)
CHLORIDE: 109 mmol/L (ref 96–112)
CO2: 31 mmol/L (ref 19–32)
Calcium: 7.7 mg/dL — ABNORMAL LOW (ref 8.4–10.5)
Creatinine, Ser: 1.07 mg/dL (ref 0.50–1.35)
GFR calc Af Amer: 86 mL/min — ABNORMAL LOW (ref >90)
GFR calc non Af Amer: 74 mL/min — ABNORMAL LOW (ref >90)
Glucose, Bld: 116 mg/dL — ABNORMAL HIGH (ref 70–99)
POTASSIUM: 3.4 mmol/L — AB (ref 3.5–5.1)
SODIUM: 141 mmol/L (ref 135–145)

## 2015-03-19 LAB — AMMONIA: Ammonia: 67 umol/L — ABNORMAL HIGH (ref 11–32)

## 2015-03-19 MED ORDER — HALOPERIDOL LACTATE 5 MG/ML IJ SOLN
INTRAMUSCULAR | Status: AC
  Administered 2015-03-19: 5 mg
  Filled 2015-03-19: qty 1

## 2015-03-19 MED ORDER — POTASSIUM CHLORIDE CRYS ER 20 MEQ PO TBCR
40.0000 meq | EXTENDED_RELEASE_TABLET | Freq: Two times a day (BID) | ORAL | Status: AC
  Administered 2015-03-19 (×2): 40 meq via ORAL
  Filled 2015-03-19 (×2): qty 2

## 2015-03-19 MED ORDER — HALOPERIDOL LACTATE 5 MG/ML IJ SOLN: 5.0000 mg | Freq: Four times a day (QID) | INTRAMUSCULAR | Status: DC | PRN

## 2015-03-19 MED ORDER — ALPRAZOLAM 0.5 MG PO TABS: 0.5000 mg | ORAL_TABLET | Freq: Two times a day (BID) | ORAL | Status: DC | PRN

## 2015-03-19 MED ORDER — FAMOTIDINE 20 MG PO TABS
20.0000 mg | ORAL_TABLET | Freq: Every day | ORAL | Status: DC
  Administered 2015-03-19 – 2015-03-20 (×2): 20 mg via ORAL
  Filled 2015-03-19 (×2): qty 1

## 2015-03-19 MED ORDER — LEVETIRACETAM 500 MG PO TABS
500.0000 mg | ORAL_TABLET | Freq: Two times a day (BID) | ORAL | Status: DC
  Administered 2015-03-19 – 2015-03-20 (×2): 500 mg via ORAL
  Filled 2015-03-19 (×3): qty 1

## 2015-03-19 MED ORDER — LACTULOSE 10 GM/15ML PO SOLN
30.0000 g | Freq: Two times a day (BID) | ORAL | Status: DC | PRN
  Administered 2015-03-19: 30 g via ORAL
  Filled 2015-03-19 (×2): qty 45

## 2015-03-19 NOTE — Progress Notes (Signed)
[facility] PROGRESS NOTE  Noah Medina QJF:[id number] DOB: 1956-12-11 DOA: 03/17/2015 PCP: Berkley Harvey, NP Interim summary: 59 year old male with h/o liver cirrhosis, hepatitis C, alcohol abuse, chronic thrombocytopenia presents after a fall. He was found to have [place]. He was admitted to step down bed in [facility]. Neuro surgery and neurology consulted for recommendations. EEG done and did not show any epileptiform activity.   Assessment/Plan: 1. Sub arachnoid hemorrhage: on CT head.  From traumatic injury.  No midline shift or mass effect upon the lateral ventricles. Neuro surgery consulted and recommendations given.  He is alert and appears to be oriented to place and person only. No new deficits noted. Repeat CT head ordered.    Generalized tonic clonic seizure: Witnessed by staff in ED. Probably from alcohol intoxication. Neurology consulted and he was started on keppra. EEG done and did not show any epileptiform activity. Alcohol withdrawal precautions on board. Appreciate neurology recommendations. No further    H/o hepatitis C and liver cirrhosis: liver cirrhosis probably from the hep c and alcohol abuse. Ammonia level is ordered 67. Ordered lactulose today. No BM yet.  Monitor.    Anemia : Mild normocytic.  Vitamin b12 and folate levels are pending.    Chronic thrombocytopenia: Probably from liver dysfunction.  Platelets are around 44,000.    Hypernatremia: - mild, from free fluid deficit. Resolved.    Hypokalemia: Replete as needed.        Code Status: full code.  Family Communication: none at bedside Disposition Plan: pending   Consultants: Neurology Neuro surgery Procedures:  CT head  EEG  Antibiotics:  NONE  HPI/Subjective: Wants to leave AMA today, but convinced him to stay till we get a repeat CT head.   Objective: Filed Vitals:   03/19/15 0808  BP: 134/65  Pulse: 80  Temp: 98.2 F (36.8 C)  Resp: 20    Intake/Output  Summary (Last 24 hours) at 03/19/15 1443 Last data filed at 03/19/15 0600  Gross per 24 hour  Intake   2540 ml  Output    750 ml  Net   1790 ml   Filed Weights   03/17/15 1856  Weight: 90.4 kg (199 lb 4.7 oz)    Exam:   General:  Alert afebrile not in any distress, slightly anxious  Cardiovascular: s1s2  Respiratory: clear to ausculation, no wheezing or rhonchi  Abdomen: soft non tender non distended bowel sounds heard  Musculoskeletal: trace pedal edema   Data Reviewed: Basic Metabolic Panel:  Recent Labs Lab 03/17/15 0630 03/17/15 1244 03/18/15 0236 03/19/15 0310  NA 143 144 146* 141  K 3.8 4.1 3.8 3.4*  CL 111 111 114* 109  CO2 24 23 25 31   GLUCOSE 111* 89 66* 116*  BUN 12 13 11 13   CREATININE 1.00 0.90 0.97 1.07  CALCIUM 8.1* 8.1* 7.8* 7.7*   Liver Function Tests:  Recent Labs Lab 03/18/15 0236  AST 90*  ALT 37  ALKPHOS 56  BILITOT 1.8*  PROT 5.8*  ALBUMIN 2.6*   No results for input(s): LIPASE, AMYLASE in the last 168 hours.  Recent Labs Lab 03/19/15 0310  AMMONIA 67*   CBC:  Recent Labs Lab 03/17/15 0630 03/17/15 1244 03/18/15 0236 03/19/15 0310  WBC 4.8 4.9 3.9* 2.7*  NEUTROABS  --  2.7  --   --   HGB 12.6* 12.6* 11.7* 11.1*  HCT 35.5* 35.4* 33.7* 31.8*  MCV 93.9 95.9 96.3 96.1  PLT 57* 66* 48* 44*  Cardiac Enzymes: No results for input(s): CKTOTAL, CKMB, CKMBINDEX, TROPONINI in the last 168 hours. BNP (last 3 results)  Recent Labs  03/17/15 0630  BNP 43.7    ProBNP (last 3 results) No results for input(s): PROBNP in the last 8760 hours.  CBG: No results for input(s): GLUCAP in the last 168 hours.  Recent Results (from the past 240 hour(s))  MRSA PCR Screening     Status: Abnormal   Collection Time: 03/17/15  7:00 PM  Result Value Ref Range Status   MRSA by PCR POSITIVE (A) NEGATIVE Final    Comment:        The GeneXpert MRSA Assay (FDA approved for NASAL specimens only), is one component of a comprehensive  MRSA colonization surveillance program. It is not intended to diagnose MRSA infection nor to guide or monitor treatment for MRSA infections. RESULT CALLED TO, READ BACK BY AND VERIFIED WITH: Loraine Leriche [id number] WILDERK      Studies: No results found.  Scheduled Meds: . Chlorhexidine Gluconate Cloth  6 each Topical Q0600  . folic acid  1 mg Oral Daily  . levETIRAcetam  500 mg Intravenous Q12H  . multivitamin with minerals  1 tablet Oral Daily  . mupirocin ointment  1 application Nasal BID  . nicotine  21 mg Transdermal Daily  . potassium chloride  40 mEq Oral BID  . thiamine  100 mg Oral Daily   Or  . thiamine  100 mg Intravenous Daily   Continuous Infusions: . sodium chloride 100 mL/hr at 03/19/15 0600    Active Problems:   Cirrhosis of liver with history of hepatitis C   Fall   Thrombocytopenia   Alcohol intoxication   SAH (subarachnoid hemorrhage)    Time spent: 25 minutes    [facility] Pager [phone] If 7PM-7AM, please contact night-coverage at [email or web address], password [facility] 03/19/2015, 2:43 PM  LOS: 2 days

## 2015-03-19 NOTE — Progress Notes (Signed)
Patient ID: Noah Medina, male   DOB: 01-09-1956, 59 y.o.   MRN: [id number] Neuro stable. No need for neuro intervention. Will f/u prn

## 2015-03-19 NOTE — Progress Notes (Signed)
Subjective: No further seizures and no HA.   Exam: Filed Vitals:   03/19/15 0808  BP: 134/65  Pulse: 80  Temp: 98.2 F (36.8 C)  Resp: 20   Gen: In bed, NAD MS: Alert and oriented to year and hospital. Able to follow commands.  CN: II-XII intact Motor: moving all extremities antigravity with good strength. Shows both a postural and action tremor Sensory: intact DTR:2+ throughout   Pertinent Labs: WBC 2.7 PLT 44  EEG: This is a normal EEG recording during wakefulness and during sleep. No evidence of an epileptic disorder was demonstrated. Please be aware, however, that a normal EEG recording, and an of itself, does not rule out an epileptic disorder  Etta Quill PA-C [facility] [phone]  Impression: 59 yo M with likley acute symptomatic seizure. Whether from concussion or his SAH is not clear. He has an irritant(blood) adjacent to his cortex and in that situation I do favor continuing AED therapy in the short term(e.g. 3 - 6 months) and I advised the patient that he was not allowed to drive.   I think the tremor is ET given the character, fact that it is always present and family history. No clear signs of withdrawal currently.   Recommendations: 1) Keppra 500mg  BID 2) No driving for 6 months from the most recent seizure. I discussed this with him and he expressed understanding.  3) Please call with any questions or concerns.     03/19/2015, 9:50 AM

## 2015-03-19 NOTE — Consult Note (Signed)
NAMEKINO, DUNSWORTH NO.:  [id number]  MEDICAL RECORD NO.:  [id number]  LOCATION:  3S03C                        FACILITY:  [facility]  PHYSICIAN:  Leeroy Cha, M.D.   DATE OF BIRTH:  1956/01/11  DATE OF CONSULTATION: DATE OF DISCHARGE:                                CONSULTATION   Mr. Purdom is a gentleman, who has a history of cirrhosis of the liver, chronic alcoholic, and low platelets.  He was seen in the emergency room at [facility] where a CT scan showed a small tiny intracerebral hematoma with no shift whatsoever.  I talked to the hospitalist.  They were concerned because he has a history of cirrhosis of the liver and low platelets.  Although the patient was stable.  It was decided to be transferred to [facility] for observation. Right now, the patient is awake, talking, moving all 4 extremities.  I cannot find any neurological findings.  He has a tremor probably might be secondary to withdrawal.  He has been seen by the neurologist.  The CT scan show a really small amount of blood in the occipital area with no shift whatsoever.  Recommendation will be to repeat the CT scan in the next 24-48 hours.  At the present time, there is no need for any surgical intervention.          ______________________________ Leeroy Cha, M.D.     EB/MEDQ  D:  03/18/2015  T:  03/19/2015  Job:  [id number]

## 2015-03-19 NOTE — Progress Notes (Signed)
Patient arrived to 4N32 AAOx4 and cooperative. IV team placed new line. Patient oriented to the room, tele placed, vitals taken and bed alarm set. Will continue to monitor. Jaylan Duggar, Rande Brunt, RN

## 2015-03-20 ENCOUNTER — Encounter (HOSPITAL_COMMUNITY): Payer: Self-pay

## 2015-03-20 ENCOUNTER — Emergency Department (HOSPITAL_COMMUNITY)
Admission: EM | Admit: 2015-03-20 | Discharge: 2015-03-20 | Disposition: A | Discharge status: Home / Self Care | Payer: Medicaid Other | Attending: Emergency Medicine | Admitting: Emergency Medicine

## 2015-03-20 DIAGNOSIS — Z590 Homelessness: Secondary | ICD-10-CM | POA: Insufficient documentation

## 2015-03-20 DIAGNOSIS — Z720 Tobacco use: Secondary | ICD-10-CM | POA: Insufficient documentation

## 2015-03-20 DIAGNOSIS — R270 Ataxia, unspecified: Secondary | ICD-10-CM | POA: Diagnosis not present

## 2015-03-20 DIAGNOSIS — Z8679 Personal history of other diseases of the circulatory system: Secondary | ICD-10-CM | POA: Insufficient documentation

## 2015-03-20 DIAGNOSIS — K219 Gastro-esophageal reflux disease without esophagitis: Secondary | ICD-10-CM | POA: Insufficient documentation

## 2015-03-20 DIAGNOSIS — Z8619 Personal history of other infectious and parasitic diseases: Secondary | ICD-10-CM | POA: Insufficient documentation

## 2015-03-20 DIAGNOSIS — Z87828 Personal history of other (healed) physical injury and trauma: Secondary | ICD-10-CM | POA: Insufficient documentation

## 2015-03-20 DIAGNOSIS — Z79899 Other long term (current) drug therapy: Secondary | ICD-10-CM | POA: Insufficient documentation

## 2015-03-20 LAB — BASIC METABOLIC PANEL
Anion gap: 7 (ref 5–15)
Anion gap: 8 (ref 5–15)
BUN: 6 mg/dL (ref 6–23)
BUN: 9 mg/dL (ref 6–23)
CHLORIDE: 112 mmol/L (ref 96–112)
CO2: 21 mmol/L (ref 19–32)
CO2: 24 mmol/L (ref 19–32)
CREATININE: 0.95 mg/dL (ref 0.50–1.35)
Calcium: 8 mg/dL — ABNORMAL LOW (ref 8.4–10.5)
Calcium: 8.9 mg/dL (ref 8.4–10.5)
Chloride: 111 mmol/L (ref 96–112)
Creatinine, Ser: 0.92 mg/dL (ref 0.50–1.35)
GFR calc Af Amer: >90 mL/min (ref >90)
GFR calc non Af Amer: >90 mL/min (ref >90)
GFR calc non Af Amer: 89 mL/min — ABNORMAL LOW (ref >90)
GLUCOSE: 94 mg/dL (ref 70–99)
GLUCOSE: 101 mg/dL — AB (ref 70–99)
POTASSIUM: 3.7 mmol/L (ref 3.5–5.1)
Potassium: 3.8 mmol/L (ref 3.5–5.1)
SODIUM: 139 mmol/L (ref 135–145)
Sodium: 144 mmol/L (ref 135–145)

## 2015-03-20 LAB — VITAMIN B12: VITAMIN B 12: 613 pg/mL (ref 211–911)

## 2015-03-20 LAB — CBC
HEMATOCRIT: 36.3 % — AB (ref 39.0–52.0)
HEMOGLOBIN: 12.5 g/dL — AB (ref 13.0–17.0)
MCH: 33.5 pg (ref 26.0–34.0)
MCHC: 34.4 g/dL (ref 30.0–36.0)
MCV: 97.3 fL (ref 78.0–100.0)
Platelets: 46 10*3/uL — ABNORMAL LOW (ref 150–400)
RBC: 3.73 MIL/uL — ABNORMAL LOW (ref 4.22–5.81)
RDW: 15.4 % (ref 11.5–15.5)
WBC: 3.2 10*3/uL — ABNORMAL LOW (ref 4.0–10.5)

## 2015-03-20 LAB — ETHANOL

## 2015-03-20 MED ORDER — FOLIC ACID 1 MG PO TABS: 1.0000 mg | ORAL_TABLET | Freq: Every day | ORAL | Status: DC

## 2015-03-20 MED ORDER — LEVETIRACETAM 500 MG PO TABS: 500.0000 mg | ORAL_TABLET | Freq: Two times a day (BID) | ORAL | Status: DC

## 2015-03-20 MED ORDER — LACTULOSE 10 G PO PACK: 10.0000 g | PACK | Freq: Three times a day (TID) | ORAL | Status: DC

## 2015-03-20 MED ORDER — THIAMINE HCL 100 MG PO TABS: 100.0000 mg | ORAL_TABLET | Freq: Every day | ORAL | Status: DC

## 2015-03-20 NOTE — ED Notes (Signed)
EMS were called by police, who observed pt. "staggering in and about traffic".  Pt. States he was d/c'd. From [facility] today.  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 He is drowsy and in no distress.  He is markedly malodorous.

## 2015-03-20 NOTE — ED Provider Notes (Signed)
CSN: [id number]     Arrival date & time 03/20/15  1557 History   First MD Initiated Contact with Patient 03/20/15 1624     Chief Complaint  Patient presents with  . Dizziness     (Consider location/radiation/quality/duration/timing/severity/associated sxs/prior Treatment) Patient is a 60 y.o. male presenting with dizziness. The history is provided by the patient.  Dizziness Quality:  Imbalance Severity:  Mild Onset quality:  Gradual Timing:  Constant Progression:  Unchanged Chronicity:  New Context comment:  Recent traumatic SAH Relieved by:  Nothing Worsened by:  Nothing Associated symptoms: no shortness of breath and no vomiting     Past Medical History  Diagnosis Date  . Cirrhosis of liver   . Hepatitis C 03-28-12    ? 80's-prev. IV drug abuse  . 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 Blood transfusion 03-28-12    '81-hx. of bleeding ulcer  . History of bleeding ulcers 03-28-12    Hx. '81  . GERD (gastroesophageal reflux disease) 03-26-12    Hx. reflux. ulcers in past, occ. OTC med  . Incisional hernia     has drainage system from abdomin  . Subdural hematoma 12/13/12  . Alcohol abuse   . Homelessness    Past Surgical History  Procedure Laterality Date  . Umbilical hernia repair    . Hernia repair  08/24/11    ventral hernia with mesh  . Hernia repair  22'97    Umbilical  . Peptic ulcer  03-26-12    open peptic ulcer surgery repair- '81  . Incisional hernia repair  03/26/2012  . Incisional hernia repair  03/30/2012    Procedure: LAPAROSCOPIC INCISIONAL HERNIA;  Surgeon: Harl Bowie, MD;  Location: [facility];  Service: General;  Laterality: N/A;  Laparoscopic Incisional/ventral Hernia Repair with Mesh  . Tonsillectomy    . Appendectomy    . Incisional hernia repair  07/20/2012    mesh  . Incisional hernia repair  07/20/2012    Procedure: HERNIA REPAIR INCISIONAL;  Surgeon: Harl Bowie, MD;  Location: [place];  Service: General;   Laterality: N/A;  repair of incisional hernia with Biologic mesh   Family History  Problem Relation Age of Onset  . Cancer Mother     breast  . Cancer Father     brain  . Heart disease Brother   . Cancer Sister     breast  . Cancer Sister     breast   History  Substance Use Topics  . Smoking status: Current Every Day Smoker -- 0.25 packs/day for 38 years    Types: Cigarettes  . Smokeless tobacco: Never Used  . Alcohol Use: 75.6 oz/week    126 Cans of beer per week    Review of Systems  Constitutional: Negative for fever.  Respiratory: Negative for cough and shortness of breath.   Gastrointestinal: Negative for vomiting and abdominal pain.  Neurological: Positive for dizziness.  All other systems reviewed and are negative.     Allergies  Review of patient's allergies indicates no known allergies.  Home Medications   Prior to Admission medications   Medication Sig Start Date End Date Taking? Authorizing Provider  atenolol (TENORMIN) 25 MG tablet Take 1 tablet (25 mg total) by mouth daily. Patient not taking: Reported on 03/17/2015 03/07/15   Kerrie Buffalo, NP  dicyclomine (BENTYL) 10 MG capsule Take 1 capsule (10 mg total) by mouth 3 (three) times daily before meals. Patient not taking: Reported on 03/17/2015 03/08/15  [name], MD  famotidine (PEPCID) 20 MG tablet Take 1 tablet (20 mg total) by mouth 2 (two) times daily. Patient not taking: Reported on 03/17/2015 03/07/15   Kerrie Buffalo, NP  folic acid (FOLVITE) 1 MG tablet Take 1 tablet (1 mg total) by mouth daily. 03/20/15   Thurnell Lose, MD  lactulose (CEPHULAC) 10 G packet Take 1 packet (10 g total) by mouth 3 (three) times daily. 03/20/15   Thurnell Lose, MD  levETIRAcetam (KEPPRA) 500 MG tablet Take 1 tablet (500 mg total) by mouth 2 (two) times daily. 03/20/15   Thurnell Lose, MD  pantoprazole (PROTONIX) 40 MG tablet Take 1 tablet (40 mg total) by mouth daily. Patient not taking: Reported on 03/17/2015  03/07/15   Kerrie Buffalo, NP  thiamine 100 MG tablet Take 1 tablet (100 mg total) by mouth daily. 03/20/15   Thurnell Lose, MD  traZODone (DESYREL) 50 MG tablet Take 1 tablet (50 mg total) by mouth at bedtime as needed for sleep. Patient not taking: Reported on 03/17/2015 03/07/15   Kerrie Buffalo, NP   BP 136/70 mmHg  Pulse 91  Temp(Src) 98.2 F (36.8 C) (Oral)  Resp 18  SpO2 93% Physical Exam  Constitutional: He is oriented to person, place, and time. He appears well-developed and well-nourished. No distress.  HENT:  Head: Normocephalic and atraumatic.  Mouth/Throat: No oropharyngeal exudate.  Eyes: EOM are normal. Pupils are equal, round, and reactive to light.  Neck: Normal range of motion. Neck supple.  Cardiovascular: Normal rate and regular rhythm.  Exam reveals no friction rub.   No murmur heard. Pulmonary/Chest: Effort normal and breath sounds normal. No respiratory distress. He has no wheezes. He has no rales.  Abdominal: He exhibits no distension. There is no tenderness. There is no rebound.  Musculoskeletal: Normal range of motion. He exhibits no edema.  Neurological: He is alert and oriented to person, place, and time.  Skin: He is not diaphoretic.  Nursing note and vitals reviewed.   ED Course  Procedures (including critical care time) Labs Review Labs Reviewed  CBC  BASIC METABOLIC PANEL  ETHANOL    Imaging Review Ct Head Wo Contrast  03/19/2015   CLINICAL DATA:  Follow up subarachnoid hemorrhage  EXAM: CT HEAD WITHOUT CONTRAST  TECHNIQUE: Contiguous axial images were obtained from the base of the skull through the vertex without intravenous contrast.  COMPARISON:  03/17/15  FINDINGS: Again identified is acute trace right parietal convexity subarachnoid hemorrhage, seen best on images  22 and 23. This is unchanged from the prior study. No significant surrounding edema. No midline shift. No other foci of hemorrhage. Stable mild to moderate atrophy.  IMPRESSION: Stable  trace subarachnoid hemorrhage   Electronically Signed   By: Skipper Cliche M.D.   On: 03/19/2015 15:17     EKG Interpretation None      MDM   Final diagnoses:  Ataxia    59 year old male brought in the police because he was stumbling. Patient discharged today after a 3 day stay for a small traumatic subarachnoid and seizure. He was noted to be unsteady on notes today, but he was restless and wanted to be discharged. PT notes from earlier today state he was able to ambulate 150 feet with and without a walker, but due to his unsteadiness they will were worried he would need placement. The notes say patient was ready to leave and was given a Tax adviser for [employer]. Patient was picked up by  police because he was stumbling. He denies any alcohol use after discharge. Here he is cognizant, does not seem intoxicated. Vitals are stable. Patient is mildly unstable on his feet, but able to ambulate, turn, get up from seated to standing position. He does occasionally stumble but does not require assistance with ambulation. I suspect this could be due to chronic alcoholism and his recent head trauma. We'll send off ethanol level on labs. I suspect patient was picked up because he is a frequent flyer to the ED for alcoholism and intoxication and please thought he was drunk when he might not be. Alcohol negative. Ambulatory here. Stable for discharge.    Evelina Bucy, MD 03/21/15 [phone]

## 2015-03-20 NOTE — Evaluation (Signed)
Physical Therapy Evaluation Patient Details Name: Noah Medina MRN: [id number] DOB: 05-26-1956 Today's Date: 03/20/2015   History of Present Illness  Pt admitted to ED s/p fall presenting with SDH. Pt with known alcohol abuse and kidney disease, hep C. and cirrhosis of the liver. Pt also with hernia and wears abdominal binder.  Clinical Impression  Pt presenting with both mobility and cognitive deficits. Pt with inconsistent/unclear PLOF and living situation. CHart says homeless and he reports he lives with sister who works 1/2 days. Pt is extremely unsteady and at high falls risk. If family/friends can not provide 24/7 assist pt will need ST-SNF to allow for pt to achieve safe mod I level of function for safe d/c to shelter or sisters house.    Follow Up Recommendations SNF;Supervision/Assistance - 24 hour    Equipment Recommendations  Rolling walker with 5" wheels    Recommendations for Other Services       Precautions / Restrictions Precautions Precautions: Fall Restrictions Weight Bearing Restrictions: No      Mobility  Bed Mobility Overal bed mobility: Needs Assistance Bed Mobility: Supine to Sit     Supine to sit: Min assist     General bed mobility comments: v/c's to initiate transfer, labored effort, use of bed rail. assist for trunk elevation  Transfers Overall transfer level: Needs assistance Equipment used: Rolling walker (2 wheeled) Transfers: Sit to/from Stand Sit to Stand: Min assist         General transfer comment: max directional v/c's for safety and hand placement, increased time due to instability  Ambulation/Gait Ambulation/Gait assistance: Min assist Ambulation Distance (Feet): 150 Feet Assistive device: Rolling walker (2 wheeled);None Gait Pattern/deviations: Step-through pattern;Decreased stride length;Shuffle;Staggering left;Staggering right;Trunk flexed Gait velocity: slow   General Gait Details: pt very unsteady with and without RW.  Pt with increased stability over all with RW and demo'd increased step length however required minA and max v/c's for safewalker management.   Stairs            Wheelchair Mobility    Modified Rankin (Stroke Patients Only)       Balance                                             Pertinent Vitals/Pain Pain Assessment: No/denies pain    Home Living Family/patient expects to be discharged to:: Unsure Living Arrangements:  (pt reports he lives with sister but  chart says homeless)                    Prior Function Level of Independence: Independent               Hand Dominance        Extremity/Trunk Assessment   Upper Extremity Assessment: Generalized weakness           Lower Extremity Assessment: Generalized weakness      Cervical / Trunk Assessment: Normal  Communication   Communication: HOH (delayed processing)  Cognition Arousal/Alertness: Awake/alert Behavior During Therapy: Flat affect Overall Cognitive Status: Impaired/Different from baseline Area of Impairment: Attention;Following commands;Safety/judgement;Awareness;Problem solving   Current Attention Level: Sustained Memory: Decreased short-term memory Following Commands: Follows one step commands with increased time Safety/Judgement: Decreased awareness of deficits;Decreased awareness of safety Awareness: Intellectual Problem Solving: Slow processing;Difficulty sequencing;Requires verbal cues;Requires tactile cues General Comments: pt with difficulty following commands for MMT  General Comments General comments (skin integrity, edema, etc.): pt able to read room numbers and find own room with min v/c's. Pt required many v/c's to find the room numbers.    Exercises        Assessment/Plan    PT Assessment Patient needs continued PT services  PT Diagnosis Difficulty walking;Abnormality of gait   PT Problem List Decreased strength;Decreased range of  motion;Decreased activity tolerance;Decreased balance;Decreased mobility  PT Treatment Interventions DME instruction;Gait training;Stair training;Functional mobility training;Therapeutic activities;Therapeutic exercise;Balance training   PT Goals (Current goals can be found in the Care Plan section) Acute Rehab PT Goals PT Goal Formulation: With patient Time For Goal Achievement: 04/03/15 Potential to Achieve Goals: Good    Frequency Min 3X/week   Barriers to discharge Decreased caregiver support unsure where pt will be staying    Co-evaluation               End of Session Equipment Utilized During Treatment: Gait belt Activity Tolerance: Patient tolerated treatment well Patient left: in bed;with call bell/phone within reach;with bed alarm set Nurse Communication: Mobility status         Time: 1020-1050 PT Time Calculation (min) (ACUTE ONLY): 30 min   Charges:   PT Evaluation $Initial PT Evaluation Tier I: 1 Procedure PT Treatments $Gait Training: 8-22 mins   PT G CodesKingsley Callander 03/20/2015, 1:39 PM  Kittie Plater, PT, DPT Pager #: [phone] Office #: [phone]

## 2015-03-20 NOTE — ED Notes (Signed)
He has just been assisted to ambulate in the presence of Dr. Mingo Amber.  He ambulates slowly and capably at present with mild ataxia/shaky.

## 2015-03-20 NOTE — Progress Notes (Signed)
Talked to patient about DCP; patient will go to the [facility] at discharge; Patient has Medicaid as his medical insurance/ prescription drug coverage; PCP Dr Eldridge Abrahams; Patient stated that he does not have any problems getting his medication; pharmacy of choice is [facility]; Aneta Mins [phone]

## 2015-03-20 NOTE — Discharge Summary (Signed)
Noah Medina, is a 59 y.o. male  DOB [birth year]  MRN [id number].  Admission date:  03/17/2015  Admitting Physician  Elmarie Shiley, MD  Discharge Date:  03/20/2015   Primary MD  Berkley Harvey, NP  Recommendations for primary care physician for things to follow:   Repeat CBC, BMP and a 2 view chest x-ray in a week.   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 Cirrhosis of liver   . Hepatitis C 03-28-12    ? 80's-prev. IV drug abuse  . 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 Blood transfusion 03-28-12    '81-hx. of bleeding ulcer  . History of bleeding ulcers 03-28-12    Hx. '81  . GERD (gastroesophageal reflux disease) 03-26-12    Hx. reflux. ulcers in past, occ. OTC med  . Incisional hernia     has drainage system from abdomin  . Subdural hematoma 12/13/12  . Alcohol abuse   . Homelessness     Past Surgical History  Procedure Laterality Date  . Umbilical hernia repair    . Hernia repair  08/24/11    ventral hernia with mesh  . Hernia repair  32'67    Umbilical  . Peptic ulcer  03-26-12    open peptic ulcer surgery repair- '81  . Incisional hernia repair  03/26/2012  . Incisional hernia repair  03/30/2012    Procedure: LAPAROSCOPIC INCISIONAL HERNIA;  Surgeon: Harl Bowie, MD;  Location: [facility];  Service:  General;  Laterality: N/A;  Laparoscopic Incisional/ventral Hernia Repair with Mesh  . Tonsillectomy    . Appendectomy    . Incisional hernia repair  07/20/2012    mesh  . Incisional hernia repair  07/20/2012    Procedure: HERNIA REPAIR INCISIONAL;  Surgeon: Harl Bowie, MD;  Location: [facility];  Service: General;  Laterality: N/A;  repair of incisional hernia with Biologic mesh       History of present illness and  Hospital Course:     Kindly see H&P for history of present illness and admission details, please review complete Labs, Consult reports and Test reports for all details in brief  HPI  from the history and physical done on the day of admission  Noah Medina is a 59 y.o. male with PMH significant for cirrhosis of the liver, hepatitis C, alcohol abuse, chronic thrombocytopenia who presents after a fall. Patient appears to be intoxicated, He relates that last alcohol drink was day prior to admission. He said, he fell backward, doesn't know how the fall happen. He  is lethargic after ativan. He had a generalized tonic clonic seizure, witnessed by staff in the ED. He is able to answer some questions. He denies headaches, chest pain, dyspnea.  Evaluation in the ED: CT head: Acute trace right parietal convexity subarachnoid hemorrhage. No midline shift or mass effect upon the lateral ventricles. Inferior cervical spine disc degenerative change without focal acute finding. Platelet at 66, Alcohol level at 318.   Hospital Course    1. Traumatic subdural Hemorrhage on CT scan. Secondary to a fall, street of alcohol abuse. Seen by neurology and neurosurgery. Repeat head CT stable. He is symptom-free. Refused placement. Will be discharged home. Advised not to drive, quit alcohol, he refused all help. Is competent.  2. Generalized tonic-clonic seizure. Could be comminution of alcohol intoxication was is the subdural bleed. Seen by neurology. No signs of DTs or alcohol withdrawal at this  time. Placed on Keppra. Advised not to drive till cleared by neurology.   3. History of hepatitis C and liver cirrhosis. Stable. Will be placed on lactulose with outpatient GI follow-up, ammonia was borderline 67.   4. Anemia of chronic disease. Outpatient workup by PCP. B-12 level was stable.   5. Alcohol abuse. Counseled to quit. Placed on thiamine and folic acid. No signs of withdrawal.   6. Chronic thrombocytopenia. Likely due to cirrhosis. No signs of bleeding.    Discharge Condition: Stable   Follow UP  Follow-up Information    Follow up with Berkley Harvey, NP. Schedule an appointment as soon as possible for a visit in 1 week.   Specialty:  Nurse Practitioner   Contact information:   [address] [place] [address] [phone]       Follow up with [facility] [place]. Schedule an appointment as soon as possible for a visit in 1 week.   Why:  seizure   Contact information:   [address] [place] [address] [phone]      Follow up with Silvano Rusk, MD. Schedule an appointment as soon as possible for a visit in 1 week.   Specialty:  Gastroenterology   Why:  Hep C with cirrhosis   Contact information:   [address] [address] [phone]         Discharge Instructions  and  Discharge Medications          Discharge Instructions    Discharge instructions    Complete by:  As directed   Follow with Primary MD Berkley Harvey, NP in 7 days   Get CBC, CMP, 2 view Chest X ray checked  by Primary MD next visit.    Activity: As tolerated with Full fall precautions use walker/cane & assistance as needed   Disposition Home     Diet: Heart Healthy    For Heart failure patients - Check your Weight same time everyday, if you gain over 2 pounds, or you develop in leg swelling, experience more shortness of breath or chest pain, call your Primary MD immediately.  Follow Cardiac Low Salt Diet and 1.5 lit/day fluid restriction.   On your next visit with your primary care physician please Get Medicines reviewed and adjusted.   Please request your Prim.MD to go over all Hospital Tests and Procedure/Radiological results at the follow up, please get all Hospital records sent to your Prim MD by signing hospital release before you go home.   If you experience worsening of your admission  symptoms, develop shortness of breath, life threatening emergency, suicidal or homicidal thoughts you must seek medical attention immediately by calling 911 or calling your MD immediately  if symptoms less severe.  You Must read complete instructions/literature along with all the possible adverse reactions/side effects for all the Medicines you take and that have been prescribed to you. Take any new Medicines after you have completely understood and accpet all the possible adverse reactions/side effects.   Do not drive, operating heavy machinery, perform activities at heights, swimming or participation in water activities or provide baby sitting services until you have seen by the Neurologist and advised to do so again.  Do not drive when taking Pain medications.    Do not take more than prescribed Pain, Sleep and Anxiety Medications  Special Instructions: If you have smoked or chewed Tobacco  in the last 2 yrs please stop smoking, stop any regular Alcohol  and or any Recreational drug use.  Wear Seat belts while driving.   Please note  You were cared for by a hospitalist during your hospital stay. If you have any questions about your discharge medications or the care you received while you were in the hospital after you are discharged, you can call the unit and asked to speak with the hospitalist on call if the hospitalist that took care of you is not available. Once you are discharged, your primary care physician will handle any further medical issues. Please note that NO  REFILLS for any discharge medications will be authorized once you are discharged, as it is imperative that you return to your primary care physician (or establish a relationship with a primary care physician if you do not have one) for your aftercare needs so that they can reassess your need for medications and monitor your lab values.     Increase activity slowly    Complete by:  As directed             Medication List    TAKE these medications        atenolol 25 MG tablet  Commonly known as:  TENORMIN  Take 1 tablet (25 mg total) by mouth daily.     dicyclomine 10 MG capsule  Commonly known as:  BENTYL  Take 1 capsule (10 mg total) by mouth 3 (three) times daily before meals.     famotidine 20 MG tablet  Commonly known as:  PEPCID  Take 1 tablet (20 mg total) by mouth 2 (two) times daily.     folic acid 1 MG tablet  Commonly known as:  FOLVITE  Take 1 tablet (1 mg total) by mouth daily.     lactulose 10 G packet  Commonly known as:  CEPHULAC  Take 1 packet (10 g total) by mouth 3 (three) times daily.     levETIRAcetam 500 MG tablet  Commonly known as:  KEPPRA  Take 1 tablet (500 mg total) by mouth 2 (two) times daily.     pantoprazole 40 MG tablet  Commonly known as:  PROTONIX  Take 1 tablet (40 mg total) by mouth daily.     thiamine 100 MG tablet  Take 1 tablet (100 mg total) by mouth daily.     traZODone 50 MG tablet  Commonly known as:  DESYREL  Take 1 tablet (50 mg total) by mouth at bedtime as needed for sleep.          Diet and Activity recommendation: See Discharge Instructions above   Consults obtained -  N Surgery, Neurology   Major procedures and Radiology Reports - PLEASE review detailed and final reports for all details, in brief -       Dg Chest 2 View  03/17/2015   CLINICAL DATA:  Chest pain shortness of Breath  EXAM: CHEST  2 VIEW  COMPARISON:  03/08/2015  FINDINGS: The heart size and mediastinal contours are within normal limits. Both  lungs are clear. The visualized skeletal structures are unremarkable. Postsurgical changes are noted about the gastroesophageal junction.  IMPRESSION: No active cardiopulmonary disease.   Electronically Signed   By: Inez Catalina M.D.   On: 03/17/2015 07:25   Ct Head Wo Contrast  03/19/2015   CLINICAL DATA:  Follow up subarachnoid hemorrhage  EXAM: CT HEAD WITHOUT CONTRAST  TECHNIQUE: Contiguous axial images were obtained from the base of the skull through the vertex without intravenous contrast.  COMPARISON:  03/17/15  FINDINGS: Again identified is acute trace right parietal convexity subarachnoid hemorrhage, seen best on images  22 and 23. This is unchanged from the prior study. No significant surrounding edema. No midline shift. No other foci of hemorrhage. Stable mild to moderate atrophy.  IMPRESSION: Stable trace subarachnoid hemorrhage   Electronically Signed   By: Skipper Cliche M.D.   On: 03/19/2015 15:17   Ct Head Wo Contrast  03/17/2015   CLINICAL DATA:  Intoxication, fall, occipital head trauma  EXAM: CT HEAD WITHOUT CONTRAST  CT CERVICAL SPINE WITHOUT CONTRAST  TECHNIQUE: Multidetector CT imaging of the head and cervical spine was performed following the standard protocol without intravenous contrast. Multiplanar CT image reconstructions of the cervical spine were also generated.  COMPARISON:  03/01/2015 brain MRI and head CT, C-spine CT 12/15/2014  FINDINGS: CT HEAD FINDINGS  Trace right high parietal convexity subarachnoid hemorrhage is identified, image 22. No mass lesion or infarct is identified. Stable degree of cortical volume loss with proportional ventricular prominence. No midline shift. Orbits and paranasal sinuses are unremarkable. No skull fracture. Right occipital scalp swelling is evident.  CT CERVICAL SPINE FINDINGS  C1 through the cervicothoracic junction is visualized in its entirety. Normal alignment. No precervical soft tissue widening. No fracture or dislocation. Mild inferior  cervical spine degenerative change is identified with mild bilateral neural foraminal narrowing, right greater than left, spanning C5-T1. No fracture or dislocation. Emphysematous changes are partly visualized at the lung apices.  IMPRESSION: Acute trace right parietal convexity subarachnoid hemorrhage. No midline shift or mass effect upon the lateral ventricles.  Inferior cervical spine disc degenerative change without focal acute finding.  Critical Value/emergent results were called by telephone at the time of interpretation on 03/17/2015 at 12:27 pm to Dr. Virgel Manifold , who verbally acknowledged these results.   Electronically Signed   By: Conchita Paris M.D.   On: 03/17/2015 12:30   Ct Head Wo Contrast  03/01/2015   CLINICAL DATA:  59 year old male acute dizziness and numbness.  EXAM: CT HEAD WITHOUT CONTRAST  TECHNIQUE: Contiguous axial images were obtained from the base of the skull through the vertex without intravenous contrast.  COMPARISON:  12/15/2014 and prior head CTs  FINDINGS: Mild generalized atrophy and chronic small-vessel white matter ischemic changes are again noted.  No acute intracranial abnormalities are identified, including mass lesion or mass effect, hydrocephalus, extra-axial fluid collection, midline shift, hemorrhage, or acute infarction.  The visualized bony calvarium is unremarkable.  IMPRESSION: No evidence of acute intracranial abnormality.  Mild atrophy and chronic small-vessel white matter ischemic changes.   Electronically Signed  By: Margarette Canada M.D.   On: 03/01/2015 21:58   Ct Cervical Spine Wo Contrast  03/17/2015   CLINICAL DATA:  Intoxication, fall, occipital head trauma  EXAM: CT HEAD WITHOUT CONTRAST  CT CERVICAL SPINE WITHOUT CONTRAST  TECHNIQUE: Multidetector CT imaging of the head and cervical spine was performed following the standard protocol without intravenous contrast. Multiplanar CT image reconstructions of the cervical spine were also generated.  COMPARISON:   03/01/2015 brain MRI and head CT, C-spine CT 12/15/2014  FINDINGS: CT HEAD FINDINGS  Trace right high parietal convexity subarachnoid hemorrhage is identified, image 22. No mass lesion or infarct is identified. Stable degree of cortical volume loss with proportional ventricular prominence. No midline shift. Orbits and paranasal sinuses are unremarkable. No skull fracture. Right occipital scalp swelling is evident.  CT CERVICAL SPINE FINDINGS  C1 through the cervicothoracic junction is visualized in its entirety. Normal alignment. No precervical soft tissue widening. No fracture or dislocation. Mild inferior cervical spine degenerative change is identified with mild bilateral neural foraminal narrowing, right greater than left, spanning C5-T1. No fracture or dislocation. Emphysematous changes are partly visualized at the lung apices.  IMPRESSION: Acute trace right parietal convexity subarachnoid hemorrhage. No midline shift or mass effect upon the lateral ventricles.  Inferior cervical spine disc degenerative change without focal acute finding.  Critical Value/emergent results were called by telephone at the time of interpretation on 03/17/2015 at 12:27 pm to Dr. Virgel Manifold , who verbally acknowledged these results.   Electronically Signed   By: Conchita Paris M.D.   On: 03/17/2015 12:30   Mr Brain Wo Contrast  03/01/2015   CLINICAL DATA:  Blurry vision, essential tremor, dizziness, numbness for 5 years, progressively worsening. History of cirrhosis, hepatitis-C. Low  EXAM: MRI HEAD WITHOUT CONTRAST  TECHNIQUE: Multiplanar, multiecho pulse sequences of the brain and surrounding structures were obtained without intravenous contrast.  COMPARISON:  CT of the head March 01, 2015 at 2115 hours and MRI of the brain August 17, 2013  FINDINGS: Moderately motion degraded examination, technologist for attributes motion to patient's known tremor.  No reduced diffusion to suggest acute ischemia. No susceptibility  artifact to suggest hemorrhage. Ventricles and sulci are normal for patient's age. Patchy to confluent supratentorial white matter FLAIR T2 hyperintensities, advanced for age and, mildly advanced from prior MRI. No midline shift, mass effect or mass lesions.  No abnormal extra-axial fluid collections. Mildly to dolicoectatic intracranial vessels. Ocular globes and orbital contents are nonsuspicious though not tailored for evaluation. Trace RIGHT mastoid effusion. No abnormal sellar expansion. No cerebellar tonsillar ectopia. Patient is edentulous.  IMPRESSION: Motion degraded examination.  No acute intracranial process.  Moderate to severe matter changes, advanced for age and from prior imaging could reflect chronic small vessel ischemic disease. The dolichoectatic intracranial vessels which are associated with chronic hypertension.   Electronically Signed   By: Elon Alas   On: 03/01/2015 23:12   Dg Abd Acute W/chest  03/08/2015   CLINICAL DATA:  Acute onset of generalized abdominal pain. Loss of consciousness. Initial encounter.  EXAM: ACUTE ABDOMEN SERIES (ABDOMEN 2 VIEW & CHEST 1 VIEW)  COMPARISON:  Chest radiograph performed 02/07/2014 and CT of the abdomen and pelvis performed 07/03/2014  FINDINGS: The lungs are well-aerated. Vascular congestion is noted. There is no evidence of focal opacification, pleural effusion or pneumothorax. The cardiomediastinal silhouette is within normal limits. Postoperative change is seen about the gastroesophageal junction.  The visualized bowel gas pattern is unremarkable. Scattered stool and air  are seen within the colon; there is no evidence of small bowel dilatation to suggest obstruction. No free intra-abdominal air is identified on the provided upright view.  Stones are again noted within the gallbladder, as on prior CT.  No acute osseous abnormalities are seen; the sacroiliac joints are unremarkable in appearance.  IMPRESSION: 1. Unremarkable bowel gas pattern;  no free intra-abdominal air seen. Small amount of stool noted in the colon. 2. Cholelithiasis again noted. 3. Mild vascular congestion noted; lungs remain grossly clear.   Electronically Signed   By: Garald Balding M.D.   On: 03/08/2015 02:09    Micro Results      Recent Results (from the past 240 hour(s))  MRSA PCR Screening     Status: Abnormal   Collection Time: 03/17/15  7:00 PM  Result Value Ref Range Status   MRSA by PCR POSITIVE (A) NEGATIVE Final    Comment:        The GeneXpert MRSA Assay (FDA approved for NASAL specimens only), is one component of a comprehensive MRSA colonization surveillance program. It is not intended to diagnose MRSA infection nor to guide or monitor treatment for MRSA infections. RESULT CALLED TO, READ BACK BY AND VERIFIED WITH: Loraine Leriche [phone] [place]        Today   Subjective:   Galdino Medina today has no headache,no chest abdominal pain,no new weakness tingling or numbness, feels much better wants to go home today. Refused placement.  Objective:   Blood pressure 131/67, pulse 86, temperature 98.6 F (37 C), temperature source Oral, resp. rate 20, height 5\' 11"  (1.803 m), weight 90.4 kg (199 lb 4.7 oz), SpO2 98 %.   Intake/Output Summary (Last 24 hours) at 03/20/15 1208 Last data filed at 03/20/15 1127  Gross per 24 hour  Intake      0 ml  Output   1825 ml  Net  -1825 ml    Exam Awake Alert, Oriented x 3, No new F.N deficits, Normal affect [place].AT,PERRAL Supple Neck,No JVD, No cervical lymphadenopathy appriciated.  Symmetrical Chest wall movement, Good air movement bilaterally, CTAB RRR,No Gallops,Rubs or new Murmurs, No Parasternal Heave +ve B.Sounds, Abd Soft, Non tender, No organomegaly appriciated, No rebound -guarding or rigidity. No Cyanosis, Clubbing or edema, No new Rash or bruise  Data Review   CBC w Diff:  Lab Results  Component Value Date   WBC 2.7* 03/19/2015   HGB 11.1* 03/19/2015   HCT 31.8*  03/19/2015   PLT 44* 03/19/2015   LYMPHOPCT 33 03/17/2015   MONOPCT 8 03/17/2015   EOSPCT 3 03/17/2015   BASOPCT 0 03/17/2015    CMP:  Lab Results  Component Value Date   NA 139 03/20/2015   K 3.8 03/20/2015   CL 111 03/20/2015   CO2 21 03/20/2015   BUN 6 03/20/2015   CREATININE 0.92 03/20/2015   CREATININE 0.84 07/20/2011   PROT 5.8* 03/18/2015   ALBUMIN 2.6* 03/18/2015   BILITOT 1.8* 03/18/2015   ALKPHOS 56 03/18/2015   AST 90* 03/18/2015   ALT 37 03/18/2015  .   Total Time in preparing paper work, data evaluation and todays exam - 35 minutes  Thurnell Lose M.D on 03/20/2015 at 12:08 PM  [facility]   Office  [phone]

## 2015-03-20 NOTE — Progress Notes (Addendum)
Discharge orders received, pt for discharge home today,  IV and telemetry D/C.  D/C instructions and Rx given with verbalized understanding.  An attempt to schedule follow up appointments were made, but PCP office and Neurologist offices were on lunch breaks and RN was instructed to call back after 1300.  Pt refusing to wait until then.  Also, GI follow up unable to be made without PCP referral.   CSW notified for cab voucher, pt extremely unsteady and restless to leave.  Pt encouraged to follow pt safety precautions while still in the hospital but he is refusing to sit in the chair or the bed.  Will monitor until he leaves.

## 2015-03-20 NOTE — Discharge Instructions (Signed)
Tremor Tremor is a rhythmic, involuntary muscular contraction characterized by oscillations (to-and-fro movements) of a part of the body. The most common of all involuntary movements, tremor can affect various body parts such as the hands, head, facial structures, vocal cords, trunk, and legs; most tremors, however, occur in the hands. Tremor often accompanies neurological disorders associated with aging. Although the disorder is not life-threatening, it can be responsible for functional disability and social embarrassment. TREATMENT  There are many types of tremor and several ways in which tremor is classified. The most common classification is by behavioral context or position. There are five categories of tremor within this classification: resting, postural, kinetic, task-specific, and psychogenic. Resting or static tremor occurs when the muscle is at rest, for example when the hands are lying on the lap. This type of tremor is often seen in patients with Parkinson's disease. Postural tremor occurs when a patient attempts to maintain posture, such as holding the hands outstretched. Postural tremors include physiological tremor, essential tremor, tremor with basal ganglia disease (also seen in patients with Parkinson's disease), cerebellar postural tremor, tremor with peripheral neuropathy, post-traumatic tremor, and alcoholic tremor. Kinetic or intention (action) tremor occurs during purposeful movement, for example during finger-to-nose testing. Task-specific tremor appears when performing goal-oriented tasks such as handwriting, speaking, or standing. This group consists of primary writing tremor, vocal tremor, and orthostatic tremor. Psychogenic tremor occurs in both older and younger patients. The key feature of this tremor is that it dramatically lessens or disappears when the patient is distracted. PROGNOSIS There are some treatment options available for tremor; the appropriate treatment depends on  accurate diagnosis of the cause. Some tremors respond to treatment of the underlying condition, for example in some cases of psychogenic tremor treating the patient's underlying mental problem may cause the tremor to disappear. Also, patients with tremor due to Parkinson's disease may be treated with Levodopa drug therapy. Symptomatic drug therapy is available for several other tremors as well. For those cases of tremor in which there is no effective drug treatment, physical measures such as teaching the patient to brace the affected limb during the tremor are sometimes useful. Surgical intervention such as thalamotomy or deep brain stimulation may be useful in certain cases. Document Released: 11/20/2002 Document Revised: 02/22/2012 Document Reviewed: 11/30/2005 [facility] Patient Information 2015 [place], [place]. This information is not intended to replace advice given to you by your health care provider. Make sure you discuss any questions you have with your health care provider.  Walker Use HOW TO TELL IF A WALKER IS THE RIGHT SIZE  With your arms hanging at your sides, the walker handles should be at wrist level. If you cannot find the exact fit, choose the height that is most comfortable.  If you have been instructed to not place weight on one of your legs, you may feel more comfortable with a shorter height. If you are using the walker for balance, you may prefer a taller height.  Adjust the height by using the push buttons on the legs of your walker.  In rest position, the back leg of the walkers should be no further ahead than your toes. With your hands resting on the grips, your elbows should be slightly bent at about a 30 degree angle.  Ask your physical therapist or caregiver if you have any concerns. HOW TO USE A STANDARD WALKER (NO WHEELS)  Pick your walker up (do not slide your walker) and place it one step length in front of you.  The back legs of the walker should be no further ahead  than your toes. You should not feel like you need to lean forward to keep your hands on the grips. As you set the walker down, make sure all 4 leg tips contact the ground at the same time.  Hold onto the walker for support and step forward with your weaker leg into the middle of the walker. Follow the weight bearing instructions your caregiver has given you.  Push down with your hands and step forward with your stronger leg.  Be careful not to let the walker get too far ahead of you as you walk.  Repeat the process for each step. HOW TO USE A FRONT-WHEELED WALKER  Slide your walker forward. The back legs of the walker should be no further ahead than your toes. You should not feel like you need to lean forward to keep your hands on the grips.  Hold onto the walker for support and step forward with your weaker leg into the middle of the walker. Follow the weight bearing instructions your caregiver has given you.  Push down with your hands and step forward with your stronger leg.  Be careful not to let the walker get too far ahead of you as you walk.  Repeat the process for each step.  If your walker does not glide well over carpet, consider cutting an "x" in 2 old tennis balls and placing them over the back legs of your walker. STANDING UP FROM A CHAIR WITH ARMRESTS  It is best to sit in a firm chair with armrests.  Position your walker directly in front of your chair. Do not pull on the walker when standing up. It is too unstable to support weight when pulled on.  Slide forward in the chair, with your weaker leg ahead and stronger leg bent near the chair.  Lean forward and push up from your chair with both hands on the armrests. Straighten your stronger leg, rising to standing. Do not pull yourself up from the walker. This may cause it to tip.  When you feel steady on your feet, carefully move one hand at a time to the walker.  Stand for a few seconds to stabilize your balance before  you start to walk. STANDING UP FROM A CHAIR WITHOUT ARMRESTS  It is best to sit in a firm chair. A low seat or an overstuffed chair or sofa is hard to get out of.  Place the walker in front of you. Do not pull on the walker when coming to a standing position.  Slide forward in the chair, with your weaker leg ahead and stronger leg bent near the chair.  Push down on the chair seat with the hand opposite your weaker leg. Keep your other hand on the center of the walker's crossbar.  Stand, steady your balance, and place your hands on the walker handgrips. SITTING DOWN  Always back up toward your chair, using your walker, until you feel the back of your legs touch the chair.  If the chair has armrests, carefully reach back to put your hands on the armrests, and slowly lower your weight.  If the chair does not have armrests, consider backing up to the side of the chair. You can then hold onto the back of the chair and the front of the seat to slowly lower yourself.  You should never feel like you are falling into your chair. USING A WALKER ON STEPS  Before attempting to use your walker on steps, practice with your physical therapist.  If you are going up a step wide enough to accommodate the entire walker and yourself:  First, place the walker up on the step.  Second, get your feet as close to the step as you can.  Third, press down on the walker with your hands as you step up with your stronger leg. Then step up with your weaker leg.  If you are going down a step wide enough to accommodate the entire walker and yourself:  First, place the walker down on the step.  Second, hold onto the walker as you step down with your weaker leg. Then step down with your stronger leg.  If you are going up more than 1 step and have a railing:  First, turn the walker sideways, so the opening is facing in toward you.  Second, place the front 2 legs of the walker on the first step. These front  legs should be positioned at the base of the next step.  Third, test the steadiness of the walker. It should feel sturdy when you press down on the handgrip that is facing the top of the steps.  Finally, placing your weight on the railing and the walker, step up with your stronger leg first. Then step up with your weaker leg.  If you are going down more than 1 step and have a railing:  First, turn the walker sideways, so the opening is facing in toward you.  Second, place the front 2 legs of the walker down on the first step. When possible, the back legs of the walker should be positioned at the base of the previous step.  Third, test the steadiness of the walker. It should feel sturdy when you press down on the handgrip that is facing the top of the steps.  Finally, placing your weight on the railing and the walker, step down with your weaker leg first. Then step down with your stronger leg.  Be sure to check the sturdiness of the walker before each step.  Make sure you have good rubber tips on the legs of your walker to prevent it from slipping. Document Released: 11/30/2005 Document Revised: 02/22/2012 Document Reviewed: 06/09/2011 [facility] Patient Information 2015 [place], [place]. This information is not intended to replace advice given to you by your health care provider. Make sure you discuss any questions you have with your health care provider.

## 2015-03-20 NOTE — Progress Notes (Signed)
CSW was notified by physician that pt is homeless.  CSW spoke with pt at bedside. There was no family present. Per note, pt presents to [facility] due to dizziness. Patient confirms that he is homeless and has been for the past 6 months. Patient informed CSW that prior to becoming homeless he was living in a boarding house in [place]. Patient states that he left due to not getting along with other residents. Also, pt states that he would not be welcomed back to the house due to him and the owner disliking each other.  Patient informed CSW that he fell last night due to being intoxicated. Patient states that he drinks a 12 pack of beer a day. He states that he does not use any drugs.   Patient informed CSW that he [place] on the streets of [place]. Patient stated " Wherever I can".  Patient informed CSW that he gets disability as a form of income. Patient states that he does not have a support system. He states that he sometimes speaks with his son's mother but her would not be able to stay with her.  CSW reached out to multiple homeless shelter. However, non had a bed available.   Willette Brace [phone] ED CSW 03/20/2015 10:28 PM

## 2015-03-20 NOTE — Discharge Instructions (Signed)
Follow with Primary MD Berkley Harvey, NP in 7 days   Get CBC, CMP, 2 view Chest X ray checked  by Primary MD next visit.    Activity: As tolerated with Full fall precautions use walker/cane & assistance as needed   Disposition Home     Diet: Heart Healthy    For Heart failure patients - Check your Weight same time everyday, if you gain over 2 pounds, or you develop in leg swelling, experience more shortness of breath or chest pain, call your Primary MD immediately. Follow Cardiac Low Salt Diet and 1.5 lit/day fluid restriction.   On your next visit with your primary care physician please Get Medicines reviewed and adjusted.   Please request your Prim.MD to go over all Hospital Tests and Procedure/Radiological results at the follow up, please get all Hospital records sent to your Prim MD by signing hospital release before you go home.   If you experience worsening of your admission symptoms, develop shortness of breath, life threatening emergency, suicidal or homicidal thoughts you must seek medical attention immediately by calling 911 or calling your MD immediately  if symptoms less severe.  You Must read complete instructions/literature along with all the possible adverse reactions/side effects for all the Medicines you take and that have been prescribed to you. Take any new Medicines after you have completely understood and accpet all the possible adverse reactions/side effects.   Do not drive, operating heavy machinery, perform activities at heights, swimming or participation in water activities or provide baby sitting services until you have seen by the Neurologist and advised to do so again.  Do not drive when taking Pain medications.    Do not take more than prescribed Pain, Sleep and Anxiety Medications  Special Instructions: If you have smoked or chewed Tobacco  in the last 2 yrs please stop smoking, stop any regular Alcohol  and or any Recreational drug use.  Wear Seat  belts while driving.   Please note  You were cared for by a hospitalist during your hospital stay. If you have any questions about your discharge medications or the care you received while you were in the hospital after you are discharged, you can call the unit and asked to speak with the hospitalist on call if the hospitalist that took care of you is not available. Once you are discharged, your primary care physician will handle any further medical issues. Please note that NO REFILLS for any discharge medications will be authorized once you are discharged, as it is imperative that you return to your primary care physician (or establish a relationship with a primary care physician if you do not have one) for your aftercare needs so that they can reassess your need for medications and monitor your lab values.

## 2015-03-21 ENCOUNTER — Encounter (HOSPITAL_COMMUNITY): Payer: Self-pay | Admitting: *Deleted

## 2015-03-21 ENCOUNTER — Encounter (HOSPITAL_COMMUNITY): Payer: Self-pay | Admitting: Emergency Medicine

## 2015-03-21 ENCOUNTER — Emergency Department (HOSPITAL_COMMUNITY): Payer: Medicaid Other

## 2015-03-21 ENCOUNTER — Emergency Department (HOSPITAL_COMMUNITY)
Admission: EM | Admit: 2015-03-21 | Discharge: 2015-03-21 | Disposition: A | Discharge status: Home / Self Care | Payer: Medicaid Other | Source: Home / Self Care | Attending: Emergency Medicine | Admitting: Emergency Medicine

## 2015-03-21 ENCOUNTER — Emergency Department (HOSPITAL_COMMUNITY)
Admission: EM | Admit: 2015-03-21 | Discharge: 2015-03-21 | Disposition: A | Discharge status: Home / Self Care | Payer: Medicaid Other | Attending: Emergency Medicine | Admitting: Emergency Medicine

## 2015-03-21 DIAGNOSIS — R3919 Other difficulties with micturition: Secondary | ICD-10-CM | POA: Diagnosis not present

## 2015-03-21 DIAGNOSIS — M542 Cervicalgia: Secondary | ICD-10-CM | POA: Insufficient documentation

## 2015-03-21 DIAGNOSIS — Z79899 Other long term (current) drug therapy: Secondary | ICD-10-CM | POA: Insufficient documentation

## 2015-03-21 DIAGNOSIS — Z590 Homelessness: Secondary | ICD-10-CM | POA: Insufficient documentation

## 2015-03-21 DIAGNOSIS — Z8619 Personal history of other infectious and parasitic diseases: Secondary | ICD-10-CM | POA: Insufficient documentation

## 2015-03-21 DIAGNOSIS — Z720 Tobacco use: Secondary | ICD-10-CM | POA: Diagnosis not present

## 2015-03-21 DIAGNOSIS — K219 Gastro-esophageal reflux disease without esophagitis: Secondary | ICD-10-CM

## 2015-03-21 DIAGNOSIS — R062 Wheezing: Secondary | ICD-10-CM | POA: Diagnosis not present

## 2015-03-21 DIAGNOSIS — G8929 Other chronic pain: Secondary | ICD-10-CM | POA: Diagnosis not present

## 2015-03-21 DIAGNOSIS — R51 Headache: Secondary | ICD-10-CM | POA: Insufficient documentation

## 2015-03-21 DIAGNOSIS — M549 Dorsalgia, unspecified: Secondary | ICD-10-CM

## 2015-03-21 DIAGNOSIS — Z8679 Personal history of other diseases of the circulatory system: Secondary | ICD-10-CM

## 2015-03-21 DIAGNOSIS — R42 Dizziness and giddiness: Secondary | ICD-10-CM | POA: Insufficient documentation

## 2015-03-21 DIAGNOSIS — R519 Headache, unspecified: Secondary | ICD-10-CM

## 2015-03-21 LAB — CBC WITH DIFFERENTIAL/PLATELET
Basophils Absolute: 0 10*3/uL (ref 0.0–0.1)
Basophils Relative: 0 % (ref 0–1)
EOS ABS: 0.1 10*3/uL (ref 0.0–0.7)
EOS PCT: 2 % (ref 0–5)
HCT: 35.4 % — ABNORMAL LOW (ref 39.0–52.0)
HEMOGLOBIN: 12.3 g/dL — AB (ref 13.0–17.0)
LYMPHS ABS: 0.7 10*3/uL (ref 0.7–4.0)
Lymphocytes Relative: 22 % (ref 12–46)
MCH: 33.5 pg (ref 26.0–34.0)
MCHC: 34.7 g/dL (ref 30.0–36.0)
MCV: 96.5 fL (ref 78.0–100.0)
Monocytes Absolute: 0.3 10*3/uL (ref 0.1–1.0)
Monocytes Relative: 10 % (ref 3–12)
NEUTROS ABS: 2.1 10*3/uL (ref 1.7–7.7)
NEUTROS PCT: 66 % (ref 43–77)
Platelets: 43 10*3/uL — ABNORMAL LOW (ref 150–400)
RBC: 3.67 MIL/uL — AB (ref 4.22–5.81)
RDW: 15.6 % — ABNORMAL HIGH (ref 11.5–15.5)
WBC: 3.2 10*3/uL — ABNORMAL LOW (ref 4.0–10.5)

## 2015-03-21 LAB — BASIC METABOLIC PANEL
Anion gap: 8 (ref 5–15)
BUN: 5 mg/dL — ABNORMAL LOW (ref 6–23)
CO2: 27 mmol/L (ref 19–32)
Calcium: 8.8 mg/dL (ref 8.4–10.5)
Chloride: 104 mmol/L (ref 96–112)
Creatinine, Ser: 1.03 mg/dL (ref 0.50–1.35)
GFR calc non Af Amer: 78 mL/min — ABNORMAL LOW (ref >90)
GFR, EST AFRICAN AMERICAN: 90 mL/min — AB (ref >90)
GLUCOSE: 120 mg/dL — AB (ref 70–99)
POTASSIUM: 3.8 mmol/L (ref 3.5–5.1)
Sodium: 139 mmol/L (ref 135–145)

## 2015-03-21 LAB — FOLATE RBC
FOLATE, HEMOLYSATE: 421.2 ng/mL
FOLATE, RBC: 1337 ng/mL (ref >498)
Hematocrit: 31.5 % — ABNORMAL LOW (ref 37.5–51.0)

## 2015-03-21 LAB — URINALYSIS, ROUTINE W REFLEX MICROSCOPIC
Glucose, UA: NEGATIVE mg/dL
HGB URINE DIPSTICK: NEGATIVE
Ketones, ur: NEGATIVE mg/dL
Leukocytes, UA: NEGATIVE
Nitrite: NEGATIVE
Protein, ur: NEGATIVE mg/dL
Specific Gravity, Urine: 1.015 (ref 1.005–1.030)
UROBILINOGEN UA: 4 mg/dL — AB (ref 0.0–1.0)
pH: 6.5 (ref 5.0–8.0)

## 2015-03-21 LAB — ETHANOL

## 2015-03-21 MED ORDER — ACETAMINOPHEN 325 MG PO TABS
650.0000 mg | ORAL_TABLET | Freq: Once | ORAL | Status: DC
  Filled 2015-03-21: qty 2

## 2015-03-21 MED ORDER — LORAZEPAM 1 MG PO TABS
1.0000 mg | ORAL_TABLET | Freq: Once | ORAL | Status: AC
  Administered 2015-03-21: 1 mg via ORAL
  Filled 2015-03-21: qty 1

## 2015-03-21 MED ORDER — TRAMADOL HCL 50 MG PO TABS: 50.0000 mg | ORAL_TABLET | Freq: Two times a day (BID) | ORAL | Status: DC | PRN

## 2015-03-21 MED ORDER — TRAMADOL HCL 50 MG PO TABS
50.0000 mg | ORAL_TABLET | Freq: Once | ORAL | Status: AC
  Administered 2015-03-21: 50 mg via ORAL
  Filled 2015-03-21: qty 1

## 2015-03-21 NOTE — Discharge Instructions (Signed)
Headaches, Frequently Asked Questions Noah Medina, take pain medication as prescribed for your headache and see your primary physician within 3 days for follow up.  If any symptoms worsen, come back to the  ED immediately.  Thank you. MIGRAINE HEADACHES Q: What is migraine? What causes it? How can I treat it? A: Generally, migraine headaches begin as a dull ache. Then they develop into a constant, throbbing, and pulsating pain. You may experience pain at the temples. You may experience pain at the front or back of one or both sides of the head. The pain is usually accompanied by a combination of:  Nausea.  Vomiting.  Sensitivity to light and noise. Some people (about 15%) experience an aura (see below) before an attack. The cause of migraine is believed to be chemical reactions in the brain. Treatment for migraine may include over-the-counter or prescription medications. It may also include self-help techniques. These include relaxation training and biofeedback.  Q: What is an aura? A: About 15% of people with migraine get an "aura". This is a sign of neurological symptoms that occur before a migraine headache. You may see wavy or jagged lines, dots, or flashing lights. You might experience tunnel vision or blind spots in one or both eyes. The aura can include visual or auditory hallucinations (something imagined). It may include disruptions in smell (such as strange odors), taste or touch. Other symptoms include:  Numbness.  A "pins and needles" sensation.  Difficulty in recalling or speaking the correct word. These neurological events may last as long as 60 minutes. These symptoms will fade as the headache begins. Q: What is a trigger? A: Certain physical or environmental factors can lead to or "trigger" a migraine. These include:  Foods.  Hormonal changes.  Weather.  Stress. It is important to remember that triggers are different for everyone. To help prevent migraine attacks, you  need to figure out which triggers affect you. Keep a headache diary. This is a good way to track triggers. The diary will help you talk to your healthcare professional about your condition. Q: Does weather affect migraines? A: Bright sunshine, hot, humid conditions, and drastic changes in barometric pressure may lead to, or "trigger," a migraine attack in some people. But studies have shown that weather does not act as a trigger for everyone with migraines. Q: What is the link between migraine and hormones? A: Hormones start and regulate many of your body's functions. Hormones keep your body in balance within a constantly changing environment. The levels of hormones in your body are unbalanced at times. Examples are during menstruation, pregnancy, or menopause. That can lead to a migraine attack. In fact, about three quarters of all women with migraine report that their attacks are related to the menstrual cycle.  Q: Is there an increased risk of stroke for migraine sufferers? A: The likelihood of a migraine attack causing a stroke is very remote. That is not to say that migraine sufferers cannot have a stroke associated with their migraines. In persons under age 55, the most common associated factor for stroke is migraine headache. But over the course of a person's normal life span, the occurrence of migraine headache may actually be associated with a reduced risk of dying from cerebrovascular disease due to stroke.  Q: What are acute medications for migraine? A: Acute medications are used to treat the pain of the headache after it has started. Examples over-the-counter medications, NSAIDs, ergots, and triptans.  Q: What are the triptans? A:  Triptans are the newest class of abortive medications. They are specifically targeted to treat migraine. Triptans are vasoconstrictors. They moderate some chemical reactions in the brain. The triptans work on receptors in your brain. Triptans help to restore the  balance of a neurotransmitter called serotonin. Fluctuations in levels of serotonin are thought to be a main cause of migraine.  Q: Are over-the-counter medications for migraine effective? A: Over-the-counter, or "OTC," medications may be effective in relieving mild to moderate pain and associated symptoms of migraine. But you should see your caregiver before beginning any treatment regimen for migraine.  Q: What are preventive medications for migraine? A: Preventive medications for migraine are sometimes referred to as "prophylactic" treatments. They are used to reduce the frequency, severity, and length of migraine attacks. Examples of preventive medications include antiepileptic medications, antidepressants, beta-blockers, calcium channel blockers, and NSAIDs (nonsteroidal anti-inflammatory drugs). Q: Why are anticonvulsants used to treat migraine? A: During the past few years, there has been an increased interest in antiepileptic drugs for the prevention of migraine. They are sometimes referred to as "anticonvulsants". Both epilepsy and migraine may be caused by similar reactions in the brain.  Q: Why are antidepressants used to treat migraine? A: Antidepressants are typically used to treat people with depression. They may reduce migraine frequency by regulating chemical levels, such as serotonin, in the brain.  Q: What alternative therapies are used to treat migraine? A: The term "alternative therapies" is often used to describe treatments considered outside the scope of conventional Western medicine. Examples of alternative therapy include acupuncture, acupressure, and yoga. Another common alternative treatment is herbal therapy. Some herbs are believed to relieve headache pain. Always discuss alternative therapies with your caregiver before proceeding. Some herbal products contain arsenic and other toxins. TENSION HEADACHES Q: What is a tension-type headache? What causes it? How can I treat  it? A: Tension-type headaches occur randomly. They are often the result of temporary stress, anxiety, fatigue, or anger. Symptoms include soreness in your temples, a tightening band-like sensation around your head (a "vice-like" ache). Symptoms can also include a pulling feeling, pressure sensations, and contracting head and neck muscles. The headache begins in your forehead, temples, or the back of your head and neck. Treatment for tension-type headache may include over-the-counter or prescription medications. Treatment may also include self-help techniques such as relaxation training and biofeedback. CLUSTER HEADACHES Q: What is a cluster headache? What causes it? How can I treat it? A: Cluster headache gets its name because the attacks come in groups. The pain arrives with little, if any, warning. It is usually on one side of the head. A tearing or bloodshot eye and a runny nose on the same side of the headache may also accompany the pain. Cluster headaches are believed to be caused by chemical reactions in the brain. They have been described as the most severe and intense of any headache type. Treatment for cluster headache includes prescription medication and oxygen. SINUS HEADACHES Q: What is a sinus headache? What causes it? How can I treat it? A: When a cavity in the bones of the face and skull (a sinus) becomes inflamed, the inflammation will cause localized pain. This condition is usually the result of an allergic reaction, a tumor, or an infection. If your headache is caused by a sinus blockage, such as an infection, you will probably have a fever. An x-ray will confirm a sinus blockage. Your caregiver's treatment might include antibiotics for the infection, as well as antihistamines or decongestants.  REBOUND HEADACHES Q: What is a rebound headache? What causes it? How can I treat it? A: A pattern of taking acute headache medications too often can lead to a condition known as "rebound  headache." A pattern of taking too much headache medication includes taking it more than 2 days per week or in excessive amounts. That means more than the label or a caregiver advises. With rebound headaches, your medications not only stop relieving pain, they actually begin to cause headaches. Doctors treat rebound headache by tapering the medication that is being overused. Sometimes your caregiver will gradually substitute a different type of treatment or medication. Stopping may be a challenge. Regularly overusing a medication increases the potential for serious side effects. Consult a caregiver if you regularly use headache medications more than 2 days per week or more than the label advises. ADDITIONAL QUESTIONS AND ANSWERS Q: What is biofeedback? A: Biofeedback is a self-help treatment. Biofeedback uses special equipment to monitor your body's involuntary physical responses. Biofeedback monitors:  Breathing.  Pulse.  Heart rate.  Temperature.  Muscle tension.  Brain activity. Biofeedback helps you refine and perfect your relaxation exercises. You learn to control the physical responses that are related to stress. Once the technique has been mastered, you do not need the equipment any more. Q: Are headaches hereditary? A: Four out of five (80%) of people that suffer report a family history of migraine. Scientists are not sure if this is genetic or a family predisposition. Despite the uncertainty, a child has a 50% chance of having migraine if one parent suffers. The child has a 75% chance if both parents suffer.  Q: Can children get headaches? A: By the time they reach high school, most young people have experienced some type of headache. Many safe and effective approaches or medications can prevent a headache from occurring or stop it after it has begun.  Q: What type of doctor should I see to diagnose and treat my headache? A: Start with your primary caregiver. Discuss his or her  experience and approach to headaches. Discuss methods of classification, diagnosis, and treatment. Your caregiver may decide to recommend you to a headache specialist, depending upon your symptoms or other physical conditions. Having diabetes, allergies, etc., may require a more comprehensive and inclusive approach to your headache. The National Head[employer] Foundation will provide, upon request, a list of [facility] physician members in your state. Document Released: 02/20/2004 Document Revised: 02/22/2012 Document Reviewed: 07/30/2008 [facility] Patient Information 2015 [place], [place]. This information is not intended to replace advice given to you by your health care provider. Make sure you discuss any questions you have with your health care provider.

## 2015-03-21 NOTE — ED Provider Notes (Signed)
CSN: [id number]     Arrival date & time 03/21/15  1012 History   First MD Initiated Contact with Patient 03/21/15 1017     Chief Complaint  Patient presents with  . Dizziness  . Neck Pain     (Consider location/radiation/quality/duration/timing/severity/associated sxs/prior Treatment) HPI  This a 59 year old male with a history of hepatitis C, tremors, alcohol abuse, recent subarachnoid hemorrhage who presents with dizziness and neck pain area patient has been seen and evaluated for the same in the ER several times. Was seen on April 3 after a fall and found to have a very small subarachnoid hemorrhage.  Also likely had a alcohol withdrawal seizure. He has been seen twice since that time with complaints of dizziness and neck pain. Repeat CT shows no change in subarachnoid hemorrhage. On my examination the patient, he is awake, alert, oriented. He states that he has not had a drink in over 38 days; however, his EtOH was positive on April 3. States that he has dizziness, neck, and back pain. Also reports that he has been "being in tubing on himself" for months. When asked why he did not develop prior providers this, he states "I was ashamed." He states that he does not remember drinking prior to presentation on 4/3.  Past Medical History  Diagnosis Date  . Cirrhosis of liver   . Hepatitis C 03-28-12    ? 80's-prev. IV drug abuse  . 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 Blood transfusion 03-28-12    '81-hx. of bleeding ulcer  . History of bleeding ulcers 03-28-12    Hx. '81  . GERD (gastroesophageal reflux disease) 03-26-12    Hx. reflux. ulcers in past, occ. OTC med  . Incisional hernia     has drainage system from abdomin  . Subdural hematoma 12/13/12  . Alcohol abuse   . Homelessness    Past Surgical History  Procedure Laterality Date  . Umbilical hernia repair    . Hernia repair  08/24/11    ventral hernia with mesh  . Hernia repair  48'18    Umbilical  .  Peptic ulcer  03-26-12    open peptic ulcer surgery repair- '81  . Incisional hernia repair  03/26/2012  . Incisional hernia repair  03/30/2012    Procedure: LAPAROSCOPIC INCISIONAL HERNIA;  Surgeon: Harl Bowie, MD;  Location: [facility];  Service: General;  Laterality: N/A;  Laparoscopic Incisional/ventral Hernia Repair with Mesh  . Tonsillectomy    . Appendectomy    . Incisional hernia repair  07/20/2012    mesh  . Incisional hernia repair  07/20/2012    Procedure: HERNIA REPAIR INCISIONAL;  Surgeon: Harl Bowie, MD;  Location: [facility];  Service: General;  Laterality: N/A;  repair of incisional hernia with Biologic mesh   Family History  Problem Relation Age of Onset  . Cancer Mother     breast  . Cancer Father     brain  . Heart disease Brother   . Cancer Sister     breast  . Cancer Sister     breast   History  Substance Use Topics  . Smoking status: Current Every Day Smoker -- 0.25 packs/day for 38 years    Types: Cigarettes  . Smokeless tobacco: Never Used  . Alcohol Use: 75.6 oz/week    126 Cans of beer per week    Review of Systems  Constitutional: Negative.  Negative for fever.  Respiratory: Negative.  Negative for chest  tightness and shortness of breath.   Cardiovascular: Negative.  Negative for chest pain.  Gastrointestinal: Negative.  Negative for nausea, vomiting, abdominal pain, diarrhea and constipation.  Genitourinary: Positive for difficulty urinating. Negative for dysuria.  Musculoskeletal: Positive for neck pain. Negative for back pain.  Skin: Negative for rash.  Neurological: Positive for dizziness and headaches.  All other systems reviewed and are negative.     Allergies  Review of patient's allergies indicates no known allergies.  Home Medications   Prior to Admission medications   Medication Sig Start Date End Date Taking? Authorizing Provider  thiamine 100 MG tablet Take 1 tablet (100 mg total) by mouth daily. 03/20/15  Yes Thurnell Lose, MD  atenolol (TENORMIN) 25 MG tablet Take 1 tablet (25 mg total) by mouth daily. Patient not taking: Reported on 03/17/2015 03/07/15   Kerrie Buffalo, NP  dicyclomine (BENTYL) 10 MG capsule Take 1 capsule (10 mg total) by mouth 3 (three) times daily before meals. Patient not taking: Reported on 03/17/2015 03/08/15   [name], MD  famotidine (PEPCID) 20 MG tablet Take 1 tablet (20 mg total) by mouth 2 (two) times daily. Patient not taking: Reported on 03/17/2015 03/07/15   Kerrie Buffalo, NP  folic acid (FOLVITE) 1 MG tablet Take 1 tablet (1 mg total) by mouth daily. Patient not taking: Reported on 03/21/2015 03/20/15   Thurnell Lose, MD  lactulose (CEPHULAC) 10 G packet Take 1 packet (10 g total) by mouth 3 (three) times daily. 03/20/15   Thurnell Lose, MD  levETIRAcetam (KEPPRA) 500 MG tablet Take 1 tablet (500 mg total) by mouth 2 (two) times daily. 03/20/15   Thurnell Lose, MD  pantoprazole (PROTONIX) 40 MG tablet Take 1 tablet (40 mg total) by mouth daily. Patient not taking: Reported on 03/17/2015 03/07/15   Kerrie Buffalo, NP  traMADol (ULTRAM) 50 MG tablet Take 1 tablet (50 mg total) by mouth 2 (two) times daily as needed for severe pain. 03/21/15   Everlene Balls, MD  traZODone (DESYREL) 50 MG tablet Take 1 tablet (50 mg total) by mouth at bedtime as needed for sleep. Patient not taking: Reported on 03/17/2015 03/07/15   Kerrie Buffalo, NP   BP 175/68 mmHg  Pulse 88  Temp(Src) 97.7 F (36.5 C) (Oral)  Resp 16  SpO2 99% Physical Exam  Constitutional: He is oriented to person, place, and time.  Unkempt, smells of urine  HENT:  Head: Normocephalic and atraumatic.  Mucous membranes dry  Eyes: EOM are normal. Pupils are equal, round, and reactive to light.  Neck: Normal range of motion. Neck supple.  Cardiovascular: Normal rate, regular rhythm and normal heart sounds.   No murmur heard. Pulmonary/Chest: Effort normal. No respiratory distress. He has wheezes.  Abdominal: Soft. Bowel  sounds are normal. There is no tenderness. There is no rebound.  Genitourinary: Rectum normal.  Normal rectal tone  Musculoskeletal: He exhibits no edema.  Lymphadenopathy:    He has no cervical adenopathy.  Neurological: He is alert and oriented to person, place, and time.  5 out of 5 strength in all 4 extremities, no clonus noted on exam, essential tremor, normal reflexes bilaterally  Skin: Skin is warm and dry.  Nursing note and vitals reviewed.   ED Course  Procedures (including critical care time) Labs Review Labs Reviewed  CBC WITH DIFFERENTIAL/PLATELET - Abnormal; Notable for the following:    WBC 3.2 (*)    RBC 3.67 (*)    Hemoglobin 12.3 (*)  HCT 35.4 (*)    RDW 15.6 (*)    Platelets 43 (*)    All other components within normal limits  BASIC METABOLIC PANEL - Abnormal; Notable for the following:    Glucose, Bld 120 (*)    BUN 5 (*)    GFR calc non Af Amer 78 (*)    GFR calc Af Amer 90 (*)    All other components within normal limits  URINALYSIS, ROUTINE W REFLEX MICROSCOPIC - Abnormal; Notable for the following:    Color, Urine AMBER (*)    Bilirubin Urine SMALL (*)    Urobilinogen, UA 4.0 (*)    All other components within normal limits  ETHANOL    Imaging Review Ct Head Wo Contrast  03/21/2015   CLINICAL DATA:  Dizziness, neck pain  EXAM: CT HEAD WITHOUT CONTRAST  TECHNIQUE: Contiguous axial images were obtained from the base of the skull through the vertex without intravenous contrast.  COMPARISON:  03/19/2015  FINDINGS: There is no evidence of mass effect, midline shift, or extra-axial fluid collections. There is no evidence of a space-occupying lesion or intracranial hemorrhage. There is no evidence of a cortical-based area of acute infarction. There is generalized cerebral atrophy. There is periventricular white matter low attenuation likely secondary to microangiopathy.  The ventricles and sulci are appropriate for the patient's age. The basal cisterns are  patent.  Visualized portions of the orbits are unremarkable. The visualized portions of the paranasal sinuses and mastoid air cells are unremarkable.  The osseous structures are unremarkable.  IMPRESSION: 1. No acute intracranial pathology. 2. Chronic microvascular disease and cerebral atrophy.   Electronically Signed   By: Kathreen Devoid   On: 03/21/2015 14:17   Ct Head Wo Contrast  03/19/2015   CLINICAL DATA:  Follow up subarachnoid hemorrhage  EXAM: CT HEAD WITHOUT CONTRAST  TECHNIQUE: Contiguous axial images were obtained from the base of the skull through the vertex without intravenous contrast.  COMPARISON:  03/17/15  FINDINGS: Again identified is acute trace right parietal convexity subarachnoid hemorrhage, seen best on images  22 and 23. This is unchanged from the prior study. No significant surrounding edema. No midline shift. No other foci of hemorrhage. Stable mild to moderate atrophy.  IMPRESSION: Stable trace subarachnoid hemorrhage   Electronically Signed   By: Skipper Cliche M.D.   On: 03/19/2015 15:17     EKG Interpretation None      MDM   Final diagnoses:  Dizziness  Chronic neck and back pain   Patient presents with chronic symptoms of neck pain and dizziness. Now stating that he has incontinence of the urine and bowel. Reports the symptoms for several months. Normal rectal tone and normal neurologic exam.  Clinical suspicion low for cauda equina. Basic labwork obtained and repeat head CT shows resolution of subarachnoid hemorrhage. Patient is at his baseline. Has been able to ambulate without difficulty. EtOH negative here.  Given seizure risk for DTs, patient given 1 mg of Ativan.  Social work consulted for patient resources given that patient is homeless. Social work provided resources.  After history, exam, and medical workup I feel the patient has been appropriately medically screened and is safe for discharge home. Pertinent diagnoses were discussed with the patient. Patient  was given return precautions.    [name], MD 03/21/15 [id number]

## 2015-03-21 NOTE — ED Notes (Signed)
Patient states that he is dizzy and that is why he is at the ED. Upon assessment, this RN asked the patient how he acquired blood on his jeans and he stated the nurses at [facility] did that during his discharge removing his IV. He then said that [facility] discharged him for treatment at [facility]. Upon further questioning it was discovered that the patient requested discharge from [facility] and then was picked up by the police for possible ETOH. He then decided to come to [facility] after the ED discharged him.

## 2015-03-21 NOTE — ED Notes (Signed)
NAD at this time. Pt is stable and leaving via cab.

## 2015-03-21 NOTE — ED Notes (Signed)
The pt is c/o dizziness headache back pain for one week.  He was seen at [facility] yesterday and h had lab and was worked up.  The pt reports that they could not do anything for him there j[place]

## 2015-03-21 NOTE — ED Provider Notes (Signed)
CSN: [id number]     Arrival date & time 03/21/15  0005 History  This chart was scribed for Everlene Balls, MD by Randa Evens, ED Scribe. This patient was seen in room D36C/D36C and the patient's care was started at 12:26 AM.     Chief Complaint  Patient presents with  . Dizziness   Patient is a 59 y.o. male presenting with dizziness. The history is provided by the patient. No language interpreter was used.  Dizziness Severity:  Mild Duration:  2 days Timing:  Intermittent Chronicity:  New Relieved by:  None tried Worsened by:  Nothing Ineffective treatments:  None tried Associated symptoms: headaches   Associated symptoms: no weakness    HPI Comments: Noah Medina is a 59 y.o. male who presents to the Emergency Department complaining of dizziness onset 2 days prior. Pt states that he has associated HA, back pain and neck pain. Pt reports previous head injury 1 day ago. Pt states that he feel and hit his head on a pole while working in his garden. Pt denies fever, numbness or weakness. Patient is seen at [facility] emergency department for the same. He is denying any neurological complaints. He has had no recent infections or fevers.  He states he is not on any medication for his recent injury.  Past Medical History  Diagnosis Date  . Cirrhosis of liver   . Hepatitis C 03-28-12    ? 80's-prev. IV drug abuse  . 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 Blood transfusion 03-28-12    '81-hx. of bleeding ulcer  . History of bleeding ulcers 03-28-12    Hx. '81  . GERD (gastroesophageal reflux disease) 03-26-12    Hx. reflux. ulcers in past, occ. OTC med  . Incisional hernia     has drainage system from abdomin  . Subdural hematoma 12/13/12  . Alcohol abuse   . Homelessness    Past Surgical History  Procedure Laterality Date  . Umbilical hernia repair    . Hernia repair  08/24/11    ventral hernia with mesh  . Hernia repair  18'29    Umbilical  . Peptic  ulcer  03-26-12    open peptic ulcer surgery repair- '81  . Incisional hernia repair  03/26/2012  . Incisional hernia repair  03/30/2012    Procedure: LAPAROSCOPIC INCISIONAL HERNIA;  Surgeon: Harl Bowie, MD;  Location: [facility];  Service: General;  Laterality: N/A;  Laparoscopic Incisional/ventral Hernia Repair with Mesh  . Tonsillectomy    . Appendectomy    . Incisional hernia repair  07/20/2012    mesh  . Incisional hernia repair  07/20/2012    Procedure: HERNIA REPAIR INCISIONAL;  Surgeon: Harl Bowie, MD;  Location: [facility];  Service: General;  Laterality: N/A;  repair of incisional hernia with Biologic mesh   Family History  Problem Relation Age of Onset  . Cancer Mother     breast  . Cancer Father     brain  . Heart disease Brother   . Cancer Sister     breast  . Cancer Sister     breast   History  Substance Use Topics  . Smoking status: Current Every Day Smoker -- 0.25 packs/day for 38 years    Types: Cigarettes  . Smokeless tobacco: Never Used  . Alcohol Use: 75.6 oz/week    126 Cans of beer per week    Review of Systems  Constitutional: Negative for fever.  Musculoskeletal:  Positive for back pain and neck pain.  Neurological: Positive for dizziness and headaches. Negative for weakness and numbness.  All other systems reviewed and are negative.     Allergies  Review of patient's allergies indicates no known allergies.  Home Medications   Prior to Admission medications   Medication Sig Start Date End Date Taking? Authorizing Provider  atenolol (TENORMIN) 25 MG tablet Take 1 tablet (25 mg total) by mouth daily. Patient not taking: Reported on 03/17/2015 03/07/15   Kerrie Buffalo, NP  dicyclomine (BENTYL) 10 MG capsule Take 1 capsule (10 mg total) by mouth 3 (three) times daily before meals. Patient not taking: Reported on 03/17/2015 03/08/15   [name], MD  famotidine (PEPCID) 20 MG tablet Take 1 tablet (20 mg total) by mouth 2 (two) times  daily. Patient not taking: Reported on 03/17/2015 03/07/15   Kerrie Buffalo, NP  folic acid (FOLVITE) 1 MG tablet Take 1 tablet (1 mg total) by mouth daily. 03/20/15   Thurnell Lose, MD  lactulose (CEPHULAC) 10 G packet Take 1 packet (10 g total) by mouth 3 (three) times daily. 03/20/15   Thurnell Lose, MD  levETIRAcetam (KEPPRA) 500 MG tablet Take 1 tablet (500 mg total) by mouth 2 (two) times daily. 03/20/15   Thurnell Lose, MD  pantoprazole (PROTONIX) 40 MG tablet Take 1 tablet (40 mg total) by mouth daily. Patient not taking: Reported on 03/17/2015 03/07/15   Kerrie Buffalo, NP  thiamine 100 MG tablet Take 1 tablet (100 mg total) by mouth daily. 03/20/15   Thurnell Lose, MD  traZODone (DESYREL) 50 MG tablet Take 1 tablet (50 mg total) by mouth at bedtime as needed for sleep. Patient not taking: Reported on 03/17/2015 03/07/15   Kerrie Buffalo, NP   BP 136/69 mmHg  Pulse 93  Temp(Src) 97.7 F (36.5 C) (Oral)  Resp 14  Ht 5\' 11"  (1.803 m)  Wt 199 lb (90.266 kg)  BMI 27.77 kg/m2  SpO2 99%   Physical Exam  Constitutional: He is oriented to person, place, and time. Vital signs are normal. He appears well-developed and well-nourished.  Non-toxic appearance. He does not appear ill. No distress.  HENT:  Head: Normocephalic and atraumatic.  Nose: Nose normal.  Mouth/Throat: Oropharynx is clear and moist. No oropharyngeal exudate.  Eyes: Conjunctivae and EOM are normal. Pupils are equal, round, and reactive to light. No scleral icterus.  Neck: Normal range of motion. Neck supple. No tracheal deviation, no edema, no erythema and normal range of motion present. No thyroid mass and no thyromegaly present.  Cardiovascular: Normal rate, regular rhythm, S1 normal, S2 normal, normal heart sounds, intact distal pulses and normal pulses.  Exam reveals no gallop and no friction rub.   No murmur heard. Pulses:      Radial pulses are 2+ on the right side, and 2+ on the left side.       Dorsalis pedis  pulses are 2+ on the right side, and 2+ on the left side.  Pulmonary/Chest: Effort normal and breath sounds normal. No respiratory distress. He has no wheezes. He has no rhonchi. He has no rales.  Abdominal: Soft. Normal appearance and bowel sounds are normal. He exhibits no distension, no ascites and no mass. There is no hepatosplenomegaly. There is no tenderness. There is no rebound, no guarding and no CVA tenderness. A hernia is present.  Umbilical hernia that is easily reducible.   Musculoskeletal: Normal range of motion. He exhibits no edema or  tenderness.  Lymphadenopathy:    He has no cervical adenopathy.  Neurological: He is alert and oriented to person, place, and time. He has normal strength. No cranial nerve deficit or sensory deficit. He exhibits normal muscle tone.  Normal strength and sensation x 4 ext.  Skin: Skin is warm, dry and intact. No petechiae and no rash noted. He is not diaphoretic. No erythema. No pallor.  Psychiatric: He has a normal mood and affect. His behavior is normal. Judgment normal.  Nursing note and vitals reviewed.   ED Course  Procedures (including critical care time) DIAGNOSTIC STUDIES: Oxygen Saturation is 99% on RA, normal by my interpretation.    COORDINATION OF CARE:    Labs Review Labs Reviewed - No data to display  Imaging Review Ct Head Wo Contrast  03/19/2015   CLINICAL DATA:  Follow up subarachnoid hemorrhage  EXAM: CT HEAD WITHOUT CONTRAST  TECHNIQUE: Contiguous axial images were obtained from the base of the skull through the vertex without intravenous contrast.  COMPARISON:  03/17/15  FINDINGS: Again identified is acute trace right parietal convexity subarachnoid hemorrhage, seen best on images  22 and 23. This is unchanged from the prior study. No significant surrounding edema. No midline shift. No other foci of hemorrhage. Stable mild to moderate atrophy.  IMPRESSION: Stable trace subarachnoid hemorrhage   Electronically Signed   By:  Skipper Cliche M.D.   On: 03/19/2015 15:17     EKG Interpretation None      MDM   Final diagnoses:  None    Patient presents emergency department for multiple chronic complaints. He states he has a headache, neck pain, back pain. He was seen at [facility] recently for this and had a CT scan of his head which reveals a stable subarachnoid hemorrhage. Patient denies any alcohol use today. Laboratory studies at [facility] were also unremarkable. Do not believe this patient needs a repeat evaluation. He was given tramadol for his headache as Tylenol and Motrin are both contraindicated in his case. Primary care follow-up was advised. His vital signs remain within his normal limits and he is safe for discharge.  I personally performed the services described in this documentation, which was scribed in my presence. The recorded information has been reviewed and is accurate.     Everlene Balls, MD 03/21/15 [id number]

## 2015-03-21 NOTE — ED Notes (Signed)
Per [facility], pt coming in today from orthopedics office due to dizziness and neck pain. Pt has been evaluated twice in the last two days here for the same. Pt reports dizziness, neck pain and lower back pain x 6 weeks. Per PT[facility] pt very unsteady on his feet and is unable to control bowel and bladder. Pt alert x4 at this time.

## 2015-03-21 NOTE — ED Notes (Signed)
Patient states it has been six days since he has had a drink of alcohol and he always shakes and gets dizzy after several days of not drinking. He states he requested to leave the detox program on 03/20/2015 at [place] due to just not liking the detox program and does not desire it. He came in today to treat the dizzy/headache and states he is satisfied with the treatment he received and he understands to call for a follow up appointment with the NP as directed in his discharge instructions. He states that he has a telephone available to make an appointment with but does not desire to seek a detox program to take care of the dizziness associated (per the patients own statements) with his lack of alcohol.

## 2015-03-21 NOTE — Discharge Instructions (Signed)
You have been seen and evaluated multiple times for similar symptoms. You were seen today for dizziness and neck pain. Your workup is largely unremarkable. You will be given a list of homeless shelters and resources provided by social work. If you have any new or worsening symptoms, chest pain, shortness of breath, abdominal pain, weakness, numbness, tingling you should return for further management.

## 2015-03-23 ENCOUNTER — Emergency Department (HOSPITAL_COMMUNITY)
Admission: EM | Admit: 2015-03-23 | Discharge: 2015-03-23 | Disposition: A | Discharge status: Home / Self Care | Payer: Medicaid Other | Attending: Emergency Medicine | Admitting: Emergency Medicine

## 2015-03-23 ENCOUNTER — Encounter (HOSPITAL_COMMUNITY): Payer: Self-pay | Admitting: *Deleted

## 2015-03-23 DIAGNOSIS — R079 Chest pain, unspecified: Secondary | ICD-10-CM | POA: Diagnosis present

## 2015-03-23 DIAGNOSIS — F1410 Cocaine abuse, uncomplicated: Secondary | ICD-10-CM | POA: Insufficient documentation

## 2015-03-23 DIAGNOSIS — K2970 Gastritis, unspecified, without bleeding: Secondary | ICD-10-CM | POA: Diagnosis not present

## 2015-03-23 DIAGNOSIS — Z720 Tobacco use: Secondary | ICD-10-CM | POA: Insufficient documentation

## 2015-03-23 DIAGNOSIS — F10930 Alcohol use, unspecified with withdrawal, uncomplicated: Secondary | ICD-10-CM

## 2015-03-23 DIAGNOSIS — F10230 Alcohol dependence with withdrawal, uncomplicated: Secondary | ICD-10-CM | POA: Diagnosis not present

## 2015-03-23 DIAGNOSIS — F1310 Sedative, hypnotic or anxiolytic abuse, uncomplicated: Secondary | ICD-10-CM | POA: Insufficient documentation

## 2015-03-23 HISTORY — DX: Unspecified cirrhosis of liver: K74.60

## 2015-03-23 HISTORY — DX: Unspecified abdominal hernia without obstruction or gangrene: K46.9

## 2015-03-23 LAB — CBC WITH DIFFERENTIAL/PLATELET
BASOS ABS: 0 10*3/uL (ref 0.0–0.1)
Basophils Relative: 0 % (ref 0–1)
EOS ABS: 0.1 10*3/uL (ref 0.0–0.7)
EOS PCT: 2 % (ref 0–5)
HCT: 35.1 % — ABNORMAL LOW (ref 39.0–52.0)
HEMOGLOBIN: 12.2 g/dL — AB (ref 13.0–17.0)
LYMPHS ABS: 1.5 10*3/uL (ref 0.7–4.0)
LYMPHS PCT: 30 % (ref 12–46)
MCH: 33.3 pg (ref 26.0–34.0)
MCHC: 34.8 g/dL (ref 30.0–36.0)
MCV: 95.9 fL (ref 78.0–100.0)
Monocytes Absolute: 0.5 10*3/uL (ref 0.1–1.0)
Monocytes Relative: 10 % (ref 3–12)
NEUTROS ABS: 2.9 10*3/uL (ref 1.7–7.7)
NEUTROS PCT: 57 % (ref 43–77)
Platelets: 60 10*3/uL — ABNORMAL LOW (ref 150–400)
RBC: 3.66 MIL/uL — ABNORMAL LOW (ref 4.22–5.81)
RDW: 16 % — ABNORMAL HIGH (ref 11.5–15.5)
WBC: 5.1 10*3/uL (ref 4.0–10.5)

## 2015-03-23 LAB — COMPREHENSIVE METABOLIC PANEL
ALBUMIN: 3 g/dL — AB (ref 3.5–5.2)
ALT: 44 U/L (ref 0–53)
AST: 86 U/L — AB (ref 0–37)
Alkaline Phosphatase: 74 U/L (ref 39–117)
Anion gap: 10 (ref 5–15)
BILIRUBIN TOTAL: 2.8 mg/dL — AB (ref 0.3–1.2)
BUN: 10 mg/dL (ref 6–23)
CHLORIDE: 106 mmol/L (ref 96–112)
CO2: 22 mmol/L (ref 19–32)
Calcium: 8.7 mg/dL (ref 8.4–10.5)
Creatinine, Ser: 1.18 mg/dL (ref 0.50–1.35)
GFR calc Af Amer: 76 mL/min — ABNORMAL LOW (ref >90)
GFR, EST NON AFRICAN AMERICAN: 66 mL/min — AB (ref >90)
GLUCOSE: 129 mg/dL — AB (ref 70–99)
POTASSIUM: 3.4 mmol/L — AB (ref 3.5–5.1)
Sodium: 138 mmol/L (ref 135–145)
Total Protein: 6.9 g/dL (ref 6.0–8.3)

## 2015-03-23 LAB — RAPID URINE DRUG SCREEN, HOSP PERFORMED
AMPHETAMINES: NOT DETECTED
BARBITURATES: NOT DETECTED
Benzodiazepines: POSITIVE — AB
COCAINE: POSITIVE — AB
Opiates: NOT DETECTED
Tetrahydrocannabinol: NOT DETECTED

## 2015-03-23 LAB — ETHANOL

## 2015-03-23 LAB — MAGNESIUM: Magnesium: 1.6 mg/dL (ref 1.5–2.5)

## 2015-03-23 LAB — I-STAT TROPONIN, ED: Troponin i, poc: 0 ng/mL (ref 0.00–0.08)

## 2015-03-23 MED ORDER — SODIUM CHLORIDE 0.9 % IV SOLN: Freq: Once | INTRAVENOUS | Status: DC

## 2015-03-23 MED ORDER — LORAZEPAM 1 MG PO TABS
2.0000 mg | ORAL_TABLET | Freq: Once | ORAL | Status: AC
  Administered 2015-03-23: 2 mg via ORAL
  Filled 2015-03-23: qty 2

## 2015-03-23 MED ORDER — OMEPRAZOLE 20 MG PO CPDR: 20.0000 mg | DELAYED_RELEASE_CAPSULE | Freq: Two times a day (BID) | ORAL | Status: AC

## 2015-03-23 MED ORDER — SODIUM CHLORIDE 0.9 % IV BOLUS (SEPSIS)
2000.0000 mL | Freq: Once | INTRAVENOUS | Status: AC
  Administered 2015-03-23: 2000 mL via INTRAVENOUS

## 2015-03-23 MED ORDER — PANTOPRAZOLE SODIUM 40 MG PO TBEC
40.0000 mg | DELAYED_RELEASE_TABLET | Freq: Once | ORAL | Status: AC
  Administered 2015-03-23: 40 mg via ORAL
  Filled 2015-03-23: qty 1

## 2015-03-23 MED ORDER — DICYCLOMINE HCL 10 MG PO CAPS
10.0000 mg | ORAL_CAPSULE | Freq: Once | ORAL | Status: AC
  Administered 2015-03-23: 10 mg via ORAL
  Filled 2015-03-23: qty 1

## 2015-03-23 MED ORDER — LORAZEPAM 1 MG PO TABS: 1.0000 mg | ORAL_TABLET | Freq: Three times a day (TID) | ORAL | Status: DC | PRN

## 2015-03-23 MED ORDER — LORAZEPAM 2 MG/ML IJ SOLN
2.0000 mg | INTRAMUSCULAR | Status: DC | PRN
  Administered 2015-03-23: 2 mg via INTRAVENOUS
  Filled 2015-03-23: qty 1

## 2015-03-23 MED ORDER — DICYCLOMINE HCL 20 MG PO TABS: 20.0000 mg | ORAL_TABLET | Freq: Two times a day (BID) | ORAL | Status: DC

## 2015-03-23 MED ORDER — PANTOPRAZOLE SODIUM 40 MG IV SOLR
40.0000 mg | Freq: Once | INTRAVENOUS | Status: DC
  Filled 2015-03-23: qty 40

## 2015-03-23 NOTE — ED Notes (Signed)
Pt IV infiltrated, removed. Pt only received about 500 NS of fluid. MD made aware. Pt is drinking water and tolerating well. Pt's IV to left forearm is infiltrated, pressure applied.

## 2015-03-23 NOTE — Discharge Instructions (Signed)
Alcohol Withdrawal Alcohol withdrawal happens when you normally drink alcohol a lot and suddenly stop drinking. Alcohol withdrawal symptoms can be mild to very bad. Mild withdrawal symptoms can include feeling sick to your stomach (nauseous), headache, or feeling irritable. Bad withdrawal symptoms can include shakiness, being very nervous (anxious), and not thinking clearly.  HOME CARE  Join an alcohol support group.  Stay away from people or situations that make you want to drink.  Eat a healthy diet. Eat a lot of fresh fruits, vegetables, and lean meats. GET HELP RIGHT AWAY IF:   You become confused. You start to see and hear things that are not really there.  You feel your heart beating very fast.  You throw up (vomit) blood or cannot stop throwing up. This may be bright red or look like black coffee grounds.  You have blood in your poop (stool). This may be bright red, maroon colored, or black and tarry.  You are lightheaded or pass out (faint).  You develop a fever. MAKE SURE YOU:   Understand these instructions.  Will watch your condition.  Will get help right away if you are not doing well or get worse. Document Released: 05/18/2008 Document Revised: 02/22/2012 Document Reviewed: 05/18/2008 [facility] Patient Information 2015 [place], [place]. This information is not intended to replace advice given to you by your health care provider. Make sure you discuss any questions you have with your health care provider.  Gastritis, Adult Gastritis is soreness and puffiness (inflammation) of the lining of the stomach. If you do not get help, gastritis can cause bleeding and sores (ulcers) in the stomach. HOME CARE   Only take medicine as told by your doctor.  If you were given antibiotic medicines, take them as told. Finish the medicines even if you start to feel better.  Drink enough fluids to keep your pee (urine) clear or pale yellow.  Avoid foods and drinks that make your  problems worse. Foods you may want to avoid include:  Caffeine or alcohol.  Chocolate.  Mint.  Garlic and onions.  Spicy foods.  Citrus fruits, including oranges, lemons, or limes.  Food containing tomatoes, including sauce, chili, salsa, and pizza.  Fried and fatty foods.  Eat small meals throughout the day instead of large meals. GET HELP RIGHT AWAY IF:   You have black or dark red poop (stools).  You throw up (vomit) blood. It may look like coffee grounds.  You cannot keep fluids down.  Your belly (abdominal) pain gets worse.  You have a fever.  You do not feel better after 1 week.  You have any other questions or concerns. MAKE SURE YOU:   Understand these instructions.  Will watch your condition.  Will get help right away if you are not doing well or get worse. Document Released: 05/18/2008 Document Revised: 02/22/2012 Document Reviewed: 01/13/2012 [facility] Patient Information 2015 [place], [place]. This information is not intended to replace advice given to you by your health care provider. Make sure you discuss any questions you have with your health care provider.

## 2015-03-23 NOTE — ED Provider Notes (Addendum)
CSN: [id number]     Arrival date & time 03/23/15  1237 History   First MD Initiated Contact with Patient 03/23/15 1240     Chief Complaint  Patient presents with  . Chest Pain      HPI  Patient evaluation of "cramps". He is alcoholic. Went to rehabilitation. For 3 weeks. Still intermittently drinking since that time. Last drink was yesterday. Shaking, and muscle cramps GI upset and nausea today, presents here.  No frank emesis. No hematemesis. No dark urine or light stools. No falls injuries or trauma. Denies illicit drug use.  Past Medical History  Diagnosis Date  . Hepatic cirrhosis 2012  . Hernia of abdominal cavity 2013   No past surgical history on file. No family history on file. History  Substance Use Topics  . Smoking status: Current Every Day Smoker -- 0.50 packs/day  . Smokeless tobacco: Not on file  . Alcohol Use: No     Comment: pt alcoholic past, in remsiion at this time.    Review of Systems  Constitutional: Negative for fever, chills, diaphoresis, appetite change and fatigue.  HENT: Negative for mouth sores, sore throat and trouble swallowing.   Eyes: Negative for visual disturbance.  Respiratory: Negative for cough, chest tightness, shortness of breath and wheezing.   Cardiovascular: Negative for chest pain.  Gastrointestinal: Positive for nausea. Negative for vomiting, abdominal pain, diarrhea and abdominal distention.  Endocrine: Negative for polydipsia, polyphagia and polyuria.  Genitourinary: Negative for dysuria, frequency and hematuria.  Musculoskeletal: Positive for myalgias. Negative for gait problem.  Skin: Negative for color change, pallor and rash.  Neurological: Positive for tremors. Negative for dizziness, syncope, light-headedness and headaches.  Hematological: Does not bruise/bleed easily.  Psychiatric/Behavioral: Negative for behavioral problems and confusion.      Allergies  Review of patient's allergies indicates no known  allergies.  Home Medications   Prior to Admission medications   Medication Sig Start Date End Date Taking? Authorizing Provider  dicyclomine (BENTYL) 20 MG tablet Take 1 tablet (20 mg total) by mouth 2 (two) times daily. 03/23/15   Tanna Furry, MD  LORazepam (ATIVAN) 1 MG tablet Take 1 tablet (1 mg total) by mouth 3 (three) times daily as needed (withdrawl). 03/23/15   Tanna Furry, MD  omeprazole (PRILOSEC) 20 MG capsule Take 1 capsule (20 mg total) by mouth 2 (two) times daily. 03/23/15   Tanna Furry, MD   BP 136/52 mmHg  Pulse 103  Temp(Src) 98.5 F (36.9 C) (Oral)  Resp 21  Ht 5\' 11"  (1.803 m)  Wt 205 lb (92.987 kg)  BMI 28.60 kg/m2  SpO2 95% Physical Exam  Constitutional: He is oriented to person, place, and time. He appears well-developed and well-nourished. No distress.  HENT:  Head: Normocephalic.  Eyes: Conjunctivae are normal. Pupils are equal, round, and reactive to light. No scleral icterus.  Neck: Normal range of motion. Neck supple. No thyromegaly present.  Cardiovascular: Normal rate and regular rhythm.  Exam reveals no gallop and no friction rub.   No murmur heard. Sinus tachycardia.  Pulmonary/Chest: Effort normal and breath sounds normal. No respiratory distress. He has no wheezes. He has no rales.  Abdominal: Soft. Bowel sounds are normal. He exhibits no distension. There is no tenderness. There is no rebound.  Musculoskeletal: Normal range of motion.  Neurological: He is alert and oriented to person, place, and time.  Skin: Skin is warm and dry. No rash noted.  Psychiatric: He has a normal mood and affect. His behavior  is normal.    ED Course  Procedures (including critical care time) Labs Review Labs Reviewed  CBC WITH DIFFERENTIAL/PLATELET - Abnormal; Notable for the following:    RBC 3.66 (*)    Hemoglobin 12.2 (*)    HCT 35.1 (*)    RDW 16.0 (*)    Platelets 60 (*)    All other components within normal limits  COMPREHENSIVE METABOLIC PANEL - Abnormal;  Notable for the following:    Potassium 3.4 (*)    Glucose, Bld 129 (*)    Albumin 3.0 (*)    AST 86 (*)    Total Bilirubin 2.8 (*)    GFR calc non Af Amer 66 (*)    GFR calc Af Amer 76 (*)    All other components within normal limits  URINE RAPID DRUG SCREEN (HOSP PERFORMED) - Abnormal; Notable for the following:    Cocaine POSITIVE (*)    Benzodiazepines POSITIVE (*)    All other components within normal limits  MAGNESIUM  ETHANOL  I-STAT TROPOININ, ED    Imaging Review No results found.   EKG Interpretation None      MDM   Final diagnoses:  Alcohol withdrawal, uncomplicated  Gastritis    Patient feeling improved. Heart rate is improved. Cramping is resolved. Patient is staying [facility]. The IR see has physician N prescription availability for him on Monday. I think his symptoms related to #1 withdrawal, and chamber to some likely alcoholic gastritis. Plan is abstinence. Pressures were Prilosec Bentyl. Limited number of Ativan as needed for any additional symptoms, if they will fill this at [facility].    Tanna Furry, MD 03/23/15 1504  Tanna Furry, MD 03/23/15 [phone]

## 2015-03-23 NOTE — ED Notes (Signed)
Pt states she woke up with cramps in his hands and feet, then spread to his chest and then his stomach.  Pt states pain 10/10.  Pt has tremors, states he uses ativan for help but has run out.

## 2015-03-23 NOTE — ED Notes (Signed)
Pt provided [place] sandwich bag and coke. Given bus pass.

## 2015-03-26 ENCOUNTER — Encounter (HOSPITAL_COMMUNITY): Payer: Self-pay | Admitting: *Deleted

## 2015-03-26 ENCOUNTER — Emergency Department (HOSPITAL_COMMUNITY)
Admission: EM | Admit: 2015-03-26 | Discharge: 2015-03-26 | Disposition: A | Discharge status: Home / Self Care | Payer: Medicaid Other | Attending: Emergency Medicine | Admitting: Emergency Medicine

## 2015-03-26 DIAGNOSIS — Z8679 Personal history of other diseases of the circulatory system: Secondary | ICD-10-CM | POA: Diagnosis not present

## 2015-03-26 DIAGNOSIS — R079 Chest pain, unspecified: Secondary | ICD-10-CM | POA: Diagnosis present

## 2015-03-26 DIAGNOSIS — Z8619 Personal history of other infectious and parasitic diseases: Secondary | ICD-10-CM | POA: Diagnosis not present

## 2015-03-26 DIAGNOSIS — K219 Gastro-esophageal reflux disease without esophagitis: Secondary | ICD-10-CM | POA: Insufficient documentation

## 2015-03-26 DIAGNOSIS — Z590 Homelessness: Secondary | ICD-10-CM | POA: Insufficient documentation

## 2015-03-26 DIAGNOSIS — Z79899 Other long term (current) drug therapy: Secondary | ICD-10-CM | POA: Insufficient documentation

## 2015-03-26 DIAGNOSIS — R0789 Other chest pain: Secondary | ICD-10-CM

## 2015-03-26 DIAGNOSIS — Z720 Tobacco use: Secondary | ICD-10-CM | POA: Diagnosis not present

## 2015-03-26 LAB — BASIC METABOLIC PANEL
Anion gap: 9 (ref 5–15)
BUN: 9 mg/dL (ref 6–23)
CO2: 21 mmol/L (ref 19–32)
Calcium: 8.5 mg/dL (ref 8.4–10.5)
Chloride: 110 mmol/L (ref 96–112)
Creatinine, Ser: 0.97 mg/dL (ref 0.50–1.35)
GFR calc Af Amer: >90 mL/min (ref >90)
GFR calc non Af Amer: 89 mL/min — ABNORMAL LOW (ref >90)
Glucose, Bld: 77 mg/dL (ref 70–99)
Potassium: 3.7 mmol/L (ref 3.5–5.1)
Sodium: 140 mmol/L (ref 135–145)

## 2015-03-26 LAB — I-STAT TROPONIN, ED: Troponin i, poc: 0 ng/mL (ref 0.00–0.08)

## 2015-03-26 LAB — CBC
HCT: 35.1 % — ABNORMAL LOW (ref 39.0–52.0)
Hemoglobin: 12.1 g/dL — ABNORMAL LOW (ref 13.0–17.0)
MCH: 33.5 pg (ref 26.0–34.0)
MCHC: 34.5 g/dL (ref 30.0–36.0)
MCV: 97.2 fL (ref 78.0–100.0)
Platelets: 56 10*3/uL — ABNORMAL LOW (ref 150–400)
RBC: 3.61 MIL/uL — ABNORMAL LOW (ref 4.22–5.81)
RDW: 16.5 % — AB (ref 11.5–15.5)
WBC: 4.6 10*3/uL (ref 4.0–10.5)

## 2015-03-26 MED ORDER — TRAMADOL HCL 50 MG PO TABS: 50.0000 mg | ORAL_TABLET | Freq: Once | ORAL | Status: DC

## 2015-03-26 MED ORDER — PREDNISONE 20 MG PO TABS
60.0000 mg | ORAL_TABLET | ORAL | Status: AC
  Administered 2015-03-26: 60 mg via ORAL
  Filled 2015-03-26: qty 3

## 2015-03-26 MED ORDER — KETOROLAC TROMETHAMINE 30 MG/ML IJ SOLN
30.0000 mg | Freq: Once | INTRAMUSCULAR | Status: AC
  Administered 2015-03-26: 30 mg via INTRAMUSCULAR
  Filled 2015-03-26: qty 1

## 2015-03-26 MED ORDER — PREDNISONE 20 MG PO TABS: 40.0000 mg | ORAL_TABLET | Freq: Every day | ORAL | Status: AC

## 2015-03-26 MED ORDER — TRAMADOL HCL 50 MG PO TABS
50.0000 mg | ORAL_TABLET | Freq: Once | ORAL | Status: AC
  Administered 2015-03-26: 50 mg via ORAL
  Filled 2015-03-26: qty 1

## 2015-03-26 NOTE — ED Notes (Signed)
Contacted Mariann Laster RN Case Manager regarding patient's inability to acquire medications at the [facility].  States will e-mail Nunzio Cory with [facility] and will speak with patient.  Updated patient.

## 2015-03-26 NOTE — ED Provider Notes (Signed)
CSN: [id number]     Arrival date & time 03/26/15  1517 History   First MD Initiated Contact with Patient 03/26/15 1811     Chief Complaint  Patient presents with  . Chest Pain     (Consider location/radiation/quality/duration/timing/severity/associated sxs/prior Treatment) HPI Patient presents with concern of chest pain. Pain has been present for 3 days, constant, sternal, pressure-like without radiation, exertional or pleuritic exacerbation. No ongoing fever, chills, cough. No clear alleviating or exacerbating factors. Patient has had intermittent similar pain for a long time. He states that other than the chest pain he is generally well. Patient acknowledges a history of alcohol abuse, hepatitis C, cirrhosis.   Past Medical History  Diagnosis Date  . Cirrhosis of liver   . Hepatitis C 03-28-12    ? 80's-prev. IV drug abuse  . 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 Blood transfusion 03-28-12    '81-hx. of bleeding ulcer  . History of bleeding ulcers 03-28-12    Hx. '81  . GERD (gastroesophageal reflux disease) 03-26-12    Hx. reflux. ulcers in past, occ. OTC med  . Incisional hernia     has drainage system from abdomin  . Subdural hematoma 12/13/12  . Alcohol abuse   . Homelessness    Past Surgical History  Procedure Laterality Date  . Umbilical hernia repair    . Hernia repair  08/24/11    ventral hernia with mesh  . Hernia repair  74'25    Umbilical  . Peptic ulcer  03-26-12    open peptic ulcer surgery repair- '81  . Incisional hernia repair  03/26/2012  . Incisional hernia repair  03/30/2012    Procedure: LAPAROSCOPIC INCISIONAL HERNIA;  Surgeon: Harl Bowie, MD;  Location: [facility];  Service: General;  Laterality: N/A;  Laparoscopic Incisional/ventral Hernia Repair with Mesh  . Tonsillectomy    . Appendectomy    . Incisional hernia repair  07/20/2012    mesh  . Incisional hernia repair  07/20/2012    Procedure: HERNIA REPAIR INCISIONAL;   Surgeon: Harl Bowie, MD;  Location: [place];  Service: General;  Laterality: N/A;  repair of incisional hernia with Biologic mesh   Family History  Problem Relation Age of Onset  . Cancer Mother     breast  . Cancer Father     brain  . Heart disease Brother   . Cancer Sister     breast  . Cancer Sister     breast   History  Substance Use Topics  . Smoking status: Current Every Day Smoker -- 0.25 packs/day for 38 years    Types: Cigarettes  . Smokeless tobacco: Never Used  . Alcohol Use: 75.6 oz/week    126 Cans of beer per week    Review of Systems  Constitutional:       Per HPI, otherwise negative  HENT:       Per HPI, otherwise negative  Respiratory:       Per HPI, otherwise negative  Cardiovascular:       Per HPI, otherwise negative  Gastrointestinal: Negative for vomiting.  Endocrine:       Negative aside from HPI  Genitourinary:       Neg aside from HPI   Musculoskeletal:       Per HPI, otherwise negative  Skin: Negative.   Neurological: Negative for syncope.      Allergies  Atenolol and Trazodone and nefazodone  Home Medications   Prior to  Admission medications   Medication Sig Start Date End Date Taking? Authorizing Provider  levETIRAcetam (KEPPRA) 500 MG tablet Take 1 tablet (500 mg total) by mouth 2 (two) times daily. 03/20/15  Yes Thurnell Lose, MD  LORazepam (ATIVAN) 0.5 MG tablet Take 1 mg by mouth 3 (three) times daily as needed. For anxiety.   Yes Historical Provider, MD  Multiple Vitamins-Minerals (MULTIVITAMIN & MINERAL PO) Take 1 tablet by mouth daily.   Yes Historical Provider, MD  omeprazole (PRILOSEC) 20 MG capsule Take 20 mg by mouth daily.   Yes Historical Provider, MD  atenolol (TENORMIN) 25 MG tablet Take 1 tablet (25 mg total) by mouth daily. Patient not taking: Reported on 03/17/2015 03/07/15   Kerrie Buffalo, NP  dicyclomine (BENTYL) 10 MG capsule Take 1 capsule (10 mg total) by mouth 3 (three) times daily before  meals. Patient not taking: Reported on 03/17/2015 03/08/15   [name], MD  famotidine (PEPCID) 20 MG tablet Take 1 tablet (20 mg total) by mouth 2 (two) times daily. Patient not taking: Reported on 03/17/2015 03/07/15   Kerrie Buffalo, NP  folic acid (FOLVITE) 1 MG tablet Take 1 tablet (1 mg total) by mouth daily. Patient not taking: Reported on 03/21/2015 03/20/15   Thurnell Lose, MD  lactulose (CEPHULAC) 10 G packet Take 1 packet (10 g total) by mouth 3 (three) times daily. 03/20/15   Thurnell Lose, MD  pantoprazole (PROTONIX) 40 MG tablet Take 1 tablet (40 mg total) by mouth daily. Patient not taking: Reported on 03/17/2015 03/07/15   Kerrie Buffalo, NP  thiamine 100 MG tablet Take 1 tablet (100 mg total) by mouth daily. Patient not taking: Reported on 03/26/2015 03/20/15   Thurnell Lose, MD  traMADol (ULTRAM) 50 MG tablet Take 1 tablet (50 mg total) by mouth 2 (two) times daily as needed for severe pain. Patient not taking: Reported on 03/26/2015 03/21/15   Everlene Balls, MD  traZODone (DESYREL) 50 MG tablet Take 1 tablet (50 mg total) by mouth at bedtime as needed for sleep. Patient not taking: Reported on 03/17/2015 03/07/15   Kerrie Buffalo, NP   BP 137/68 mmHg  Pulse 76  Temp(Src) 98 F (36.7 C) (Oral)  Resp 16  Ht 5\' 11"  (1.803 m)  Wt 205 lb (92.987 kg)  BMI 28.60 kg/m2  SpO2 97% Physical Exam  Constitutional: He is oriented to person, place, and time. He appears well-developed. No distress.  HENT:  Head: Normocephalic and atraumatic.  Eyes: Conjunctivae and EOM are normal.  Cardiovascular: Normal rate and regular rhythm.   Pulmonary/Chest: Effort normal. No stridor. No respiratory distress.  No tenderness to palpation with pressure about the chest wall.  Abdominal: He exhibits no distension.  Musculoskeletal: He exhibits no edema.  Neurological: He is alert and oriented to person, place, and time.  Skin: Skin is warm and dry.  Psychiatric: He has a normal mood and affect.   Nursing note and vitals reviewed.   ED Course  Procedures (including critical care time) Labs Review Labs Reviewed  CBC - Abnormal; Notable for the following:    RBC 3.61 (*)    Hemoglobin 12.1 (*)    HCT 35.1 (*)    RDW 16.5 (*)    Platelets 56 (*)    All other components within normal limits  BASIC METABOLIC PANEL - Abnormal; Notable for the following:    GFR calc non Af Amer 89 (*)    All other components within normal limits  Randolm Idol, ED    Imaging Review No results found.   EKG Interpretation   Date/Time:  Tuesday March 26 2015 15:27:45 EDT Ventricular Rate:  89 PR Interval:  146 QRS Duration: 96 QT Interval:  374 QTC Calculation: 455 R Axis:   56 Text Interpretation:  Normal sinus rhythm Normal ECG Sinus rhythm Normal  ECG Confirmed by Carmin Muskrat  MD [phone]) on 03/26/2015 6:14:57 PM     A review of the chart demonstrates the patient has been seen 13 times in 6 months here for a variety of complaints.  Patient had some concern that he was unable to obtain medication, including Ativan that he requires for anxiety, alcohol abuse. We discussed his case with case management, who assisted in making sure she can obtain this medication.  Repeat exam patient is in no distress.   MDM  Patient presents with chest pain that has been present for several days per The patient has no evidence for ongoing coronary ischemia, no evidence for a minute the conversation or occult infection. Patient is a history of alcohol abuse, is currently trying to stop using alcohol. Patient received assistance here in obtaining medication, will start on medication for pain control of his chest pain, and d/c in stable condition.     Carmin Muskrat, MD 03/26/15 [phone]

## 2015-03-26 NOTE — ED Notes (Signed)
Pt states that he was seen here Saturday for cramping and was dc'd and went to [facility]. States that he has continue to have chest pain which feels like a cramp. States that there has been no change in the chest pain since Saturday. States that [facility] sent him here.

## 2015-03-26 NOTE — ED Notes (Signed)
Patient reports central chest pain 8/10, intermittent is getting worse. Patient is alert and oriented x4 at this time and remains on the cardiac monitor. MD Vanita Panda made aware.

## 2015-03-26 NOTE — Progress Notes (Signed)
ED CM received referral concerning patient, patient was seen in the ED and given a prescription for Ativan. Patient has Medicaid.  Patient is homeless lives at [facility]. Reviewed record, met with patient today at bedside, confirmed information. Discussed the [facility] and the medical services, patient familiar with the services. Informed patient that he can take prescriptions to the [facility] and get the assistance needed. Patient reports not having the $3 copay. Patient verbalized understanding and teach back done. Instructed patient to ask to speak with Nunzio Cory at the [facility] if he should have any issues getting the assistance. No further ED CM needs identified

## 2015-03-26 NOTE — Discharge Instructions (Signed)
As discussed, your evaluation today has been largely reassuring.  But, it is important that you monitor your condition carefully, and do not hesitate to return to the ED if you develop new, or concerning changes in your condition. ° °Otherwise, please follow-up with your physician for appropriate ongoing care. ° °Chest Pain (Nonspecific) °It is often hard to give a diagnosis for the cause of chest pain. There is always a chance that your pain could be related to something serious, such as a heart attack or a blood clot in the lungs. You need to follow up with your doctor. °HOME CARE °· If antibiotic medicine was given, take it as directed by your doctor. Finish the medicine even if you start to feel better. °· For the next few days, avoid activities that bring on chest pain. Continue physical activities as told by your doctor. °· Do not use any tobacco products. This includes cigarettes, chewing tobacco, and e-cigarettes. °· Avoid drinking alcohol. °· Only take medicine as told by your doctor. °· Follow your doctor's suggestions for more testing if your chest pain does not go away. °· Keep all doctor visits you made. °GET HELP IF: °· Your chest pain does not go away, even after treatment. °· You have a rash with blisters on your chest. °· You have a fever. °GET HELP RIGHT AWAY IF:  °· You have more pain or pain that spreads to your arm, neck, jaw, back, or belly (abdomen). °· You have shortness of breath. °· You cough more than usual or cough up blood. °· You have very bad back or belly pain. °· You feel sick to your stomach (nauseous) or throw up (vomit). °· You have very bad weakness. °· You pass out (faint). °· You have chills. °This is an emergency. Do not wait to see if the problems will go away. Call your local emergency services (911 in [place] Do not drive yourself to the hospital. °MAKE SURE YOU:  °· Understand these instructions. °· Will watch your condition. °· Will get help right away if you are not doing  well or get worse. °Document Released: 05/18/2008 Document Revised: 12/05/2013 Document Reviewed: 05/18/2008 °[employer]® Patient Information ©2015 [employer]. This information is not intended to replace advice given to you by your health care provider. Make sure you discuss any questions you have with your health care provider. ° °

## 2015-04-25 ENCOUNTER — Encounter (HOSPITAL_COMMUNITY): Payer: Self-pay | Admitting: Emergency Medicine

## 2015-04-25 ENCOUNTER — Emergency Department (HOSPITAL_COMMUNITY)
Admission: EM | Admit: 2015-04-25 | Discharge: 2015-04-25 | Disposition: A | Discharge status: Home / Self Care | Payer: Medicaid Other | Attending: Emergency Medicine | Admitting: Emergency Medicine

## 2015-04-25 DIAGNOSIS — Z8679 Personal history of other diseases of the circulatory system: Secondary | ICD-10-CM | POA: Insufficient documentation

## 2015-04-25 DIAGNOSIS — Z720 Tobacco use: Secondary | ICD-10-CM | POA: Diagnosis not present

## 2015-04-25 DIAGNOSIS — S51852A Open bite of left forearm, initial encounter: Secondary | ICD-10-CM | POA: Diagnosis present

## 2015-04-25 DIAGNOSIS — W503XXA Accidental bite by another person, initial encounter: Secondary | ICD-10-CM | POA: Diagnosis not present

## 2015-04-25 DIAGNOSIS — Z8619 Personal history of other infectious and parasitic diseases: Secondary | ICD-10-CM | POA: Diagnosis not present

## 2015-04-25 DIAGNOSIS — K219 Gastro-esophageal reflux disease without esophagitis: Secondary | ICD-10-CM | POA: Insufficient documentation

## 2015-04-25 DIAGNOSIS — Y998 Other external cause status: Secondary | ICD-10-CM | POA: Diagnosis not present

## 2015-04-25 DIAGNOSIS — Z23 Encounter for immunization: Secondary | ICD-10-CM | POA: Insufficient documentation

## 2015-04-25 DIAGNOSIS — Z79899 Other long term (current) drug therapy: Secondary | ICD-10-CM | POA: Insufficient documentation

## 2015-04-25 DIAGNOSIS — Y92410 Unspecified street and highway as the place of occurrence of the external cause: Secondary | ICD-10-CM | POA: Insufficient documentation

## 2015-04-25 DIAGNOSIS — Y9301 Activity, walking, marching and hiking: Secondary | ICD-10-CM | POA: Diagnosis not present

## 2015-04-25 DIAGNOSIS — Z590 Homelessness: Secondary | ICD-10-CM | POA: Diagnosis not present

## 2015-04-25 LAB — HIV ANTIBODY (ROUTINE TESTING W REFLEX): HIV Screen 4th Generation wRfx: NONREACTIVE

## 2015-04-25 LAB — HEPATITIS PANEL, ACUTE
HCV Ab: REACTIVE — AB
HEP B C IGM: NONREACTIVE
Hep A IgM: NONREACTIVE
Hepatitis B Surface Ag: NEGATIVE

## 2015-04-25 MED ORDER — AMOXICILLIN-POT CLAVULANATE 875-125 MG PO TABS: 1.0000 | ORAL_TABLET | Freq: Two times a day (BID) | ORAL | Status: DC

## 2015-04-25 MED ORDER — TETANUS-DIPHTH-ACELL PERTUSSIS 5-2.5-18.5 LF-MCG/0.5 IM SUSP
0.5000 mL | Freq: Once | INTRAMUSCULAR | Status: AC
  Administered 2015-04-25: 0.5 mL via INTRAMUSCULAR
  Filled 2015-04-25: qty 0.5
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
## 2015-04-25 NOTE — Discharge Instructions (Signed)
Please follow up with your primary care physician in 1-2 days. If you do not have one please call the [place] number listed above. Please take your antibiotic until completion. Please read all discharge instructions and return precautions.    Human Bite Human bite wounds tend to become infected, even when they seem minor at first. Bite wounds of the hand can be serious because the tendons and joints are close to the skin. Infection can develop very rapidly, even in a matter of hours.  DIAGNOSIS  Your caregiver will most likely:  Take a detailed history of the bite injury.  Perform a wound exam.  Take your medical history. Blood tests or X-rays may be performed. Sometimes, infected bite wounds are cultured and sent to a lab to identify the infectious bacteria. TREATMENT  Medical treatment will depend on the location of the bite as well as the patient's medical history. Treatment may include:  Wound care, such as cleaning and flushing the wound with saline solution, bandaging, and elevating the affected area.  Antibiotic medicine.  Tetanus immunization.  Leaving the wound open to heal. This is often done with human bites due to the high risk of infection. However, in certain cases, wound closure with stitches, wound adhesive, skin adhesive strips, or staples may be used. Infected bites that are left untreated may require intravenous (IV) antibiotics and surgical treatment in the hospital. [place]  Follow your caregiver's instructions for wound care.  Take all medicines as directed.  If your caregiver prescribes antibiotics, take them as directed. Finish them even if you start to feel better.  Follow up with your caregiver for further exams or immunizations as directed. You may need a tetanus shot if:  You cannot remember when you had your last tetanus shot.  You have never had a tetanus shot.  The injury broke your skin. If you get a  tetanus shot, your arm may swell, get red, and feel warm to the touch. This is common and not a problem. If you need a tetanus shot and you choose not to have one, there is a rare chance of getting tetanus. Sickness from tetanus can be serious. SEEK IMMEDIATE MEDICAL CARE IF:  You have increased pain, swelling, or redness around the bite wound.  You have chills.  You have a fever.  You have pus draining from the wound.  You have red streaks on the skin coming from the wound.  You have pain with movement or trouble moving the injured part.  You are not improving, or you are getting worse.  You have any other questions or concerns. MAKE SURE YOU:  Understand these instructions.  Will watch your condition.  Will get help right away if you are not doing well or get worse. Document Released: 01/07/2005 Document Revised: 02/22/2012 Document Reviewed: 07/22/2011 [facility] Patient Information 2015 [place], [place]. This information is not intended to replace advice given to you by your health care provider. Make sure you discuss any questions you have with your health care provider.

## 2015-05-05 ENCOUNTER — Emergency Department (HOSPITAL_COMMUNITY): Payer: Medicaid Other

## 2015-05-05 ENCOUNTER — Emergency Department (HOSPITAL_COMMUNITY)
Admission: EM | Admit: 2015-05-05 | Discharge: 2015-05-05 | Disposition: A | Discharge status: Home / Self Care | Payer: Medicaid Other | Attending: Emergency Medicine | Admitting: Emergency Medicine

## 2015-05-05 ENCOUNTER — Encounter (HOSPITAL_COMMUNITY): Payer: Self-pay | Admitting: Emergency Medicine

## 2015-05-05 DIAGNOSIS — Z720 Tobacco use: Secondary | ICD-10-CM | POA: Diagnosis not present

## 2015-05-05 DIAGNOSIS — F1420 Cocaine dependence, uncomplicated: Secondary | ICD-10-CM | POA: Diagnosis not present

## 2015-05-05 DIAGNOSIS — Z79899 Other long term (current) drug therapy: Secondary | ICD-10-CM | POA: Insufficient documentation

## 2015-05-05 DIAGNOSIS — F1010 Alcohol abuse, uncomplicated: Secondary | ICD-10-CM | POA: Diagnosis not present

## 2015-05-05 DIAGNOSIS — F1019 Alcohol abuse with unspecified alcohol-induced disorder: Secondary | ICD-10-CM | POA: Diagnosis present

## 2015-05-05 DIAGNOSIS — Z8719 Personal history of other diseases of the digestive system: Secondary | ICD-10-CM | POA: Diagnosis not present

## 2015-05-05 DIAGNOSIS — R05 Cough: Secondary | ICD-10-CM | POA: Insufficient documentation

## 2015-05-05 DIAGNOSIS — R059 Cough, unspecified: Secondary | ICD-10-CM

## 2015-05-05 LAB — CBC WITH DIFFERENTIAL/PLATELET
BASOS ABS: 0 10*3/uL (ref 0.0–0.1)
BASOS PCT: 1 % (ref 0–1)
EOS PCT: 4 % (ref 0–5)
Eosinophils Absolute: 0.2 10*3/uL (ref 0.0–0.7)
HEMATOCRIT: 33.7 % — AB (ref 39.0–52.0)
Hemoglobin: 11.7 g/dL — ABNORMAL LOW (ref 13.0–17.0)
LYMPHS PCT: 23 % (ref 12–46)
Lymphs Abs: 1 10*3/uL (ref 0.7–4.0)
MCH: 34 pg (ref 26.0–34.0)
MCHC: 34.7 g/dL (ref 30.0–36.0)
MCV: 98 fL (ref 78.0–100.0)
Monocytes Absolute: 0.5 10*3/uL (ref 0.1–1.0)
Monocytes Relative: 11 % (ref 3–12)
NEUTROS ABS: 2.6 10*3/uL (ref 1.7–7.7)
Neutrophils Relative %: 62 % (ref 43–77)
Platelets: 61 10*3/uL — ABNORMAL LOW (ref 150–400)
RBC: 3.44 MIL/uL — AB (ref 4.22–5.81)
RDW: 15.2 % (ref 11.5–15.5)
WBC: 4.3 10*3/uL (ref 4.0–10.5)

## 2015-05-05 LAB — COMPREHENSIVE METABOLIC PANEL
ALT: 27 U/L (ref 17–63)
ANION GAP: 8 (ref 5–15)
AST: 48 U/L — ABNORMAL HIGH (ref 15–41)
Albumin: 2.4 g/dL — ABNORMAL LOW (ref 3.5–5.0)
Alkaline Phosphatase: 80 U/L (ref 38–126)
BILIRUBIN TOTAL: 1.1 mg/dL (ref 0.3–1.2)
BUN: 7 mg/dL (ref 6–20)
CHLORIDE: 110 mmol/L (ref 101–111)
CO2: 24 mmol/L (ref 22–32)
Calcium: 8.4 mg/dL — ABNORMAL LOW (ref 8.9–10.3)
Creatinine, Ser: 1.13 mg/dL (ref 0.61–1.24)
GFR calc Af Amer: >60 mL/min (ref >60)
GFR calc non Af Amer: >60 mL/min (ref >60)
Glucose, Bld: 111 mg/dL — ABNORMAL HIGH (ref 65–99)
POTASSIUM: 3.9 mmol/L (ref 3.5–5.1)
SODIUM: 142 mmol/L (ref 135–145)
TOTAL PROTEIN: 5.9 g/dL — AB (ref 6.5–8.1)

## 2015-05-05 NOTE — ED Notes (Addendum)
Pt requesting detox from ETOH.  Last alcohol was 3 hours ago and also st's "I snorted some cocaine too".  Pt denies SI or HI

## 2015-05-05 NOTE — ED Notes (Signed)
Pt sts he has tried rehab in the past, but feels he is ready to commit to it this time because "this stuff is going to kill me.  I have cirrhosis of the liver, and I'm going to die if I don't stop.    Pt's last cocaine use was 6 hours ago, and he drank tequila, as well as beer.    Pt is currently staying at the [facility].  Pt's PCP is Eldridge Abrahams in [place].    Pt is requesting detox.

## 2015-05-05 NOTE — Discharge Instructions (Signed)
Alcohol Use Disorder °Alcohol use disorder is a mental disorder. It is not a one-time incident of heavy drinking. Alcohol use disorder is the excessive and uncontrollable use of alcohol over time that leads to problems with functioning in one or more areas of daily living. People with this disorder risk harming themselves and others when they drink to excess. Alcohol use disorder also can cause other mental disorders, such as mood and anxiety disorders, and serious physical problems. People with alcohol use disorder often misuse other drugs.  °Alcohol use disorder is common and widespread. Some people with this disorder drink alcohol to cope with or escape from negative life events. Others drink to relieve chronic pain or symptoms of mental illness. People with a family history of alcohol use disorder are at higher risk of losing control and using alcohol to excess.  °SYMPTOMS  °Signs and symptoms of alcohol use disorder may include the following:  °· Consumption of alcohol in larger amounts or over a longer period of time than intended. °· Multiple unsuccessful attempts to cut down or control alcohol use.   °· A great deal of time spent obtaining alcohol, using alcohol, or recovering from the effects of alcohol (hangover). °· A strong desire or urge to use alcohol (cravings).   °· Continued use of alcohol despite problems at work, school, or home because of alcohol use.   °· Continued use of alcohol despite problems in relationships because of alcohol use. °· Continued use of alcohol in situations when it is physically hazardous, such as driving a car. °· Continued use of alcohol despite awareness of a physical or psychological problem that is likely related to alcohol use. Physical problems related to alcohol use can involve the brain, heart, liver, stomach, and intestines. Psychological problems related to alcohol use include intoxication, depression, anxiety, psychosis, delirium, and dementia.   °· The need for  increased amounts of alcohol to achieve the same desired effect, or a decreased effect from the consumption of the same amount of alcohol (tolerance). °· Withdrawal symptoms upon reducing or stopping alcohol use, or alcohol use to reduce or avoid withdrawal symptoms. Withdrawal symptoms include: °· Racing heart. °· Hand tremor. °· Difficulty sleeping. °· Nausea. °· Vomiting. °· Hallucinations. °· Restlessness. °· Seizures. °DIAGNOSIS °Alcohol use disorder is diagnosed through an assessment by your health care provider. Your health care provider may start by asking three or four questions to screen for excessive or problematic alcohol use. To confirm a diagnosis of alcohol use disorder, at least two symptoms must be present within a 12-month period. The severity of alcohol use disorder depends on the number of symptoms: °· Mild--two or three. °· Moderate--four or five. °· Severe--six or more. °Your health care provider may perform a physical exam or use results from lab tests to see if you have physical problems resulting from alcohol use. Your health care provider may refer you to a mental health professional for evaluation. °TREATMENT  °Some people with alcohol use disorder are able to reduce their alcohol use to low-risk levels. Some people with alcohol use disorder need to quit drinking alcohol. When necessary, mental health professionals with specialized training in substance use treatment can help. Your health care provider can help you decide how severe your alcohol use disorder is and what type of treatment you need. The following forms of treatment are available:  °· Detoxification. Detoxification involves the use of prescription medicines to prevent alcohol withdrawal symptoms in the first week after quitting. This is important for people with a history of symptoms   of withdrawal and for heavy drinkers who are likely to have withdrawal symptoms. Alcohol withdrawal can be dangerous and, in severe cases, cause  death. Detoxification is usually provided in a hospital or in-patient substance use treatment facility.  Counseling or talk therapy. Talk therapy is provided by substance use treatment counselors. It addresses the reasons people use alcohol and ways to keep them from drinking again. The goals of talk therapy are to help people with alcohol use disorder find healthy activities and ways to cope with life stress, to identify and avoid triggers for alcohol use, and to handle cravings, which can cause relapse.  Medicines.Different medicines can help treat alcohol use disorder through the following actions:  Decrease alcohol cravings.  Decrease the positive reward response felt from alcohol use.  Produce an uncomfortable physical reaction when alcohol is used (aversion therapy).  Support groups. Support groups are run by people who have quit drinking. They provide emotional support, advice, and guidance. These forms of treatment are often combined. Some people with alcohol use disorder benefit from intensive combination treatment provided by specialized substance use treatment centers. Both inpatient and outpatient treatment programs are available. Document Released: 01/07/2005 Document Revised: 04/16/2014 Document Reviewed: 03/09/2013 [facility] Patient Information 2015 [place], [place]. This information is not intended to replace advice given to you by your health care provider. Make sure you discuss any questions you have with your health care provider.  Cough, Adult  A cough is a reflex that helps clear your throat and airways. It can help heal the body or may be a reaction to an irritated airway. A cough may only last 2 or 3 weeks (acute) or may last more than 8 weeks (chronic).  CAUSES Acute cough:  Viral or bacterial infections. Chronic cough:  Infections.  Allergies.  Asthma.  Post-nasal drip.  Smoking.  Heartburn or acid reflux.  Some medicines.  Chronic lung problems  (COPD).  Cancer. SYMPTOMS   Cough.  Fever.  Chest pain.  Increased breathing rate.  High-pitched whistling sound when breathing (wheezing).  Colored mucus that you cough up (sputum). TREATMENT   A bacterial cough may be treated with antibiotic medicine.  A viral cough must run its course and will not respond to antibiotics.  Your caregiver may recommend other treatments if you have a chronic cough. HOME CARE INSTRUCTIONS   Only take over-the-counter or prescription medicines for pain, discomfort, or fever as directed by your caregiver. Use cough suppressants only as directed by your caregiver.  Use a cold steam vaporizer or humidifier in your bedroom or home to help loosen secretions.  Sleep in a semi-upright position if your cough is worse at night.  Rest as needed.  Stop smoking if you smoke. SEEK IMMEDIATE MEDICAL CARE IF:   You have pus in your sputum.  Your cough starts to worsen.  You cannot control your cough with suppressants and are losing sleep.  You begin coughing up blood.  You have difficulty breathing.  You develop pain which is getting worse or is uncontrolled with medicine.  You have a fever. MAKE SURE YOU:   Understand these instructions.  Will watch your condition.  Will get help right away if you are not doing well or get worse. Document Released: 05/29/2011 Document Revised: 02/22/2012 Document Reviewed: 05/29/2011 [facility] Patient Information 2015 [place], [place]. This information is not intended to replace advice given to you by your health care provider. Make sure you discuss any questions you have with your health care provider.  Emergency Department Resource Guide 1) Find a Doctor and Pay Out of Pocket Although you won't have to find out who is covered by your insurance plan, it is a good idea to ask around and get recommendations. You will then need to call the office and see if the doctor you have chosen will accept you as a  new patient and what types of options they offer for patients who are self-pay. Some doctors offer discounts or will set up payment plans for their patients who do not have insurance, but you will need to ask so you aren't surprised when you get to your appointment.  2) Contact Your Local Health Department Not all health departments have doctors that can see patients for sick visits, but many do, so it is worth a call to see if yours does. If you don't know where your local health department is, you can check in your phone book. The CDC also has a tool to help you locate your state's health department, and many state websites also have listings of all of their local health departments.  3) Find a [facility] If your illness is not likely to be very severe or complicated, you may want to try a walk in clinic. These are popping up all over the country in pharmacies, drugstores, and shopping centers. They're usually staffed by nurse practitioners or physician assistants that have been trained to treat common illnesses and complaints. They're usually fairly quick and inexpensive. However, if you have serious medical issues or chronic medical problems, these are probably not your best option.  No Primary Care Doctor: - Call Health Connect at  [phone] - they can help you locate a primary care doctor that  accepts your insurance, provides certain services, etc. - Physician Referral Service- [phone]  Chronic Pain Problems: Organization         Address  Phone   Notes  [facility]  [phone] Patients need to be referred by their primary care doctor.   Medication Assistance: Organization         Address  Phone   Notes  [facility] Medication [facility] [place] [address] [place], [place] [address] [phone] --Must be a resident of [facility] -- Must have NO insurance coverage whatsoever (no Medicaid/ Medicare, etc.) -- The pt. MUST have a  primary care doctor that directs their care regularly and follows them in the community   Med[facility]ist  [phone]   [employer]  [phone]    Agencies that provide inexpensive medical care: Organization         Address  Phone   Notes  [place]  [phone]   [facility]    [phone]   [facility] [place], [place] [address] [phone]   [place] [address] [place] [phone]   Planned Parenthood    [phone]   [facility]    [phone]   [place] and [place] [address], [place] Phone:  [phone], Fax:  [phone] Hours of Operation:  9 am - 6 pm, M-F.  Also accepts Medicaid/Medicare and self-pay.  [facility] for [place], [address],  Phone: [phone], Fax: [phone]. Hours of Operation:  8:30 am - 5:30 pm, M-F.  Also accepts Medicaid and self-pay.  [facility]  [address] [employer] Phone: [phone]   [place], [place], [place] [phone], Ext. [phone] Mondays & Thursdays: 7-9 AM.  First 15 patients are seen on a first come, first serve basis.    [place] Providers:  Organization         Address  Phone   Notes  [facility] [place] [address], [place] [phone] Also accepts self-pay patients.  [facility] [address], [place]  [phone]   [place], [address], [place] [phone]   [facility] [address] [place] [phone]   Lucianne Lei [address], [place]   [phone] Only accepts [place] Access [place] patients after they have their name applied to their card.   Self-Pay (no insurance) in [facility]:  Organization         Address  Phone   Notes  Sickle Cell Patients, [facility] [place] [phone]   [facility] [place] [phone]   [facility] [place]  [place], [place], [place] [phone]   [facility]/Dr. Osei-Bonsu  [address] [place] or [address], [place] [phone] Phone number for both [place] and [place] locations is the same.  [facility] [address] [place] [phone]   [facility] [address], [place] or [address] [phone]   [facility] [address], [place] [phone], phone; [phone], fax Sees patients 1st and 3rd Saturday of every month.  Must not qualify for public or private insurance (i.e. Medicaid, Medicare, [place] Health Choice, Veterans' Benefits)  Household income should be no more than 200% of the poverty level The clinic cannot treat you if you are pregnant or think you are pregnant  Sexually transmitted diseases are not treated at the clinic.    Dental Care: Organization         Address  Phone  Notes  [facility] Department of [facility] [place] [phone] Accepts children up to age 39 who are enrolled in [place] or [place]; pregnant women with a Medicaid card; and children who have applied for Medicaid or [place] Health Choice, but were declined, whose parents can pay a reduced fee at time of service.  [facility] Department of [facility]  [address], [place] [phone] Accepts children up to age 82 who are enrolled in [place] or [place]; pregnant women with a Medicaid card; and children who have applied for Medicaid or [place] Health Choice, but were declined, whose parents can  pay a reduced fee at time of service.  [place] Adult Dental Access PROGRAM  [place] [phone] Patients are seen by appointment only. Walk-ins are not accepted. [place] will see patients 34 years of age and older. Monday - Tuesday (8am-5pm) Most Wednesdays (8:30-5pm) $30 per visit, cash only  [facility] Adult Dental Access PROGRAM  [address], [facility] [phone] Patients are seen by appointment only. Walk-ins are not accepted. [place] will see patients 40 years of age and older.  One Wednesday Evening (Monthly: Volunteer Based).  $30 per visit, cash only  [place]  [phone] for adults; Children under age 49, call Graduate Pediatric Dentistry at [phone]. Children aged 15-14, please call [phone] to request a pediatric application.  Dental services are provided in all areas of dental care including fillings, crowns and bridges, complete and partial dentures, implants, gum treatment, root canals, and extractions. Preventive care is also provided. Treatment is provided to both adults and children. Patients are selected via a lottery and there is often a waiting list.   [facility] [address] [place]  [phone] [email or web address]   [facility] [address] [place], [place] [phone], Ext. [phone] Second and Fourth Thursday of each month, opens at 6:30 AM; Clinic ends at 9 AM.  Patients are seen on a first-come first-served basis, and a limited number are seen during each clinic.   [facility]  [address] Hillard Danker [place], [place] [phone]   Eligibility Requirements You must have lived in [place], [place], or [place] counties for at least the last three months.   You cannot be eligible for state or federal sponsored [employer], including [employer], [place], or [employer].   You generally cannot be eligible for healthcare  insurance through your employer.    How to apply: Eligibility screenings are held every Tuesday and Wednesday afternoon from 1:00 pm until 4:00 pm. You do not need an appointment for the interview!  [facility] [address] [place], [place]   [place]  [place] Department  [place]  [phone]    Behavioral Health Resources in the Community: Intensive Outpatient Programs Organization         Address  Phone  Notes  [place], [place], [place] [phone]   [facility]patient [address], [place], [place]   ADS: Alcohol [facility] Drug Sv[facility] [address], [place], [place]   [place] [address],  [place], [place] or [phone]   Substance Abuse Resources Organization         Address  Phone  Notes  Alcohol [facility] Drug Services  [phone]   [place]  [phone]   The [place]   Chinita Pester  [phone]   Residential & Outpatient Substance Abuse Program  [phone]   Psychological Services Organization         Address  Phone  Notes  [facility] [place]  [place]  [phone]   [place] [address], [place] or [phone]    Mobile Crisis Teams Organization         Address  Phone  Notes  [facility], Mobile Crisis Care Unit  [phone]   [facility]  [address] [place], [place]   [place] [address], [place] [phone]    Self-Help/Support Groups Organization         Address  Phone             Notes  [place]. of [place] - variety of support groups  [place] Call for more information  Narcotics Anonymous (NA),  [facility] [address], [employer] [place]  2 meetings at this  location   Residential Treatment Programs Organization         Address  Phone  Notes  [facility] [address]    [place]  [phone]   [facility]  [address] [place] [phone], [place], [place]   [place], [place] [phone] Admissions: 8am-3pm M-F  Incentives Substance [place] [address]    [place], [place] [phone]   [facility] [address] [place], [place], [place]   [facility] [address],  [place], [place]   [facility] - Intensive Outpatient [address], [place], [place]   [facility] ([place] [address].,  [place], [place] [phone] or [phone]   [facility] (RTS) [address], [place], [place] Accepts Medicaid  [facility] [address],  [place] [phone] Substance Abuse/Addiction Treatment   [facility] Organization         Address  Phone  Notes  [facility]  [phone]   Domenic Schwab, PhD [address] Arlis Porta [place], [place]   [phone] or [phone]   [place], [place] [phone]   [facility] [address] [place], [place] [phone] Insurance/Medicaid/sponsorship through [facility] and Families [address]                                    [place], [place] [phone] [place] [address], [place] [phone]    Dr. Adele Schilder  [phone]   [facility] Dept. 1) [address], [place] Walk 2) [place] 3)  [place] [address], [place] [phone]  [phone]    [place] [phone] or [phone] (After Hours)

## 2015-05-05 NOTE — ED Provider Notes (Signed)
TIME SEEN: 2:51 AM   CHIEF COMPLAINT: Addiction problem   HPI: HPI Comments: Noah Medina is a 59 y.o. male with history of cirrhosis, substance abuse who presents to the Emergency Department complaining of addiction problem since being released from prison in 2012. 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 Pt states that he drinks everyday since being released from prison. Pt states that he drinks at least 4 40 ounce beers per day. Pt reports smoking cocaine today and drinking tequila today at a party. Pt denies SI, HI, or hallucinations. Pt does reports productive cough that began 6 days. Pt denies fever, SOB, vomiting or diarrhea.  No chest pain.   ROS: See HPI Constitutional: no fever  Eyes: no drainage  ENT: no runny nose   Cardiovascular:  no chest pain  Resp: no SOB  GI: no vomiting GU: no dysuria Integumentary: no rash  Allergy: no hives  Musculoskeletal: no leg swelling  Neurological: no slurred speech ROS otherwise negative  PAST MEDICAL HISTORY/PAST SURGICAL HISTORY:  Past Medical History  Diagnosis Date  . Hepatic cirrhosis 2012  . Hernia of abdominal cavity 2013    MEDICATIONS:  Prior to Admission medications   Medication Sig Start Date End Date Taking? Authorizing Provider  dicyclomine (BENTYL) 20 MG tablet Take 1 tablet (20 mg total) by mouth 2 (two) times daily. 03/23/15   Tanna Furry, MD  LORazepam (ATIVAN) 1 MG tablet Take 1 tablet (1 mg total) by mouth 3 (three) times daily as needed (withdrawl). 03/23/15   Tanna Furry, MD  Multiple Vitamins-Minerals (CENTRUM SILVER ADULT 50+ PO) Take 1 tablet by mouth daily.    Historical Provider, MD  omeprazole (PRILOSEC) 20 MG capsule Take 1 capsule (20 mg total) by mouth 2 (two) times daily. 03/23/15   Tanna Furry, MD  omeprazole (PRILOSEC) 20 MG capsule Take 20 mg by mouth daily.    Historical Provider, MD  PRESCRIPTION MEDICATION Take 1 tablet by mouth daily.    Historical Provider, MD  TraZODone HCl 150 MG TB24  Take 150 mg by mouth at bedtime as needed (for sleep).    Historical Provider, MD    ALLERGIES:  No Known Allergies  SOCIAL HISTORY:  History  Substance Use Topics  . Smoking status: Current Every Day Smoker -- 0.50 packs/day  . Smokeless tobacco: Not on file  . Alcohol Use: Yes     Comment: 3 or 4 40oz daily    FAMILY HISTORY: No family history on file.  EXAM: BP 140/74 mmHg  Pulse 83  Temp(Src) 98 F (36.7 C) (Oral)  Resp 20  Ht 5\' 11"  (1.803 m)  Wt 206 lb 3 oz (93.526 kg)  BMI 28.77 kg/m2  SpO2 96% CONSTITUTIONAL: Alert and oriented and responds appropriately to questions. Well-appearing; well-nourished HEAD: Normocephalic EYES: Conjunctivae clear, PERRL ENT: normal nose; no rhinorrhea; moist mucous membranes; pharynx without lesions noted NECK: Supple, no meningismus, no LAD  CARD: RRR; S1 and S2 appreciated; no murmurs, no clicks, no rubs, no gallops RESP: Normal chest excursion without splinting or tachypnea; breath sounds clear and equal bilaterally; no wheezes, no rhonchi, no rales, no hypoxia or respiratory distress, speaking full sentences ABD/GI: Normal bowel sounds; non-distended; soft, non-tender, no rebound, no guarding, no peritoneal signs BACK:  The back appears normal and is non-tender to palpation, there is no CVA tenderness EXT: Normal ROM in all joints; non-tender to palpation; no edema; normal capillary refill; no cyanosis, no calf tenderness or swelling  SKIN: Normal color for age and race; warm NEURO: Moves all extremities equally, sensation to light touch intact diffusely, cranial nerves II through XII intact PSYCH: The patient's mood and manner are appropriate. Grooming and personal hygiene are appropriate. No SI Hi or hallucinations.   MEDICAL DECISION MAKING: Patient here requesting detox from alcohol and cocaine. He denies a history of complicated withdrawal or withdrawal seizures. He is complaining of productive cough for the past 6 days. Lungs  are clear with no hypoxia. No respiratory distress. We'll obtain screening labs and chest x-ray have discussed with patient that we cannot offer him inpatient detox. He is comfortable with plan to follow-up as outpatient and will be provided with outpatient resources. No current psychiatric safety concerns.  ED PROGRESS: Labs unremarkable. Chest x-ray shows no infiltrate, edema. We'll discharge home with outpatient resource guide. Discussed return precautions. He verbalizes understanding and is comfortable with plan. I feel he is medically stable for outpatient detox, rehabilitation.    I personally performed the services described in this documentation, which was scribed in my presence. The recorded information has been reviewed and is accurate.     [place], DO 05/05/15 [phone]

## 2015-05-17 ENCOUNTER — Emergency Department (HOSPITAL_COMMUNITY): Payer: Medicaid Other

## 2015-05-17 ENCOUNTER — Encounter (HOSPITAL_COMMUNITY): Payer: Self-pay | Admitting: Emergency Medicine

## 2015-05-17 ENCOUNTER — Emergency Department (HOSPITAL_COMMUNITY)
Admission: EM | Admit: 2015-05-17 | Discharge: 2015-05-17 | Disposition: A | Discharge status: Home / Self Care | Payer: Medicaid Other | Attending: Emergency Medicine | Admitting: Emergency Medicine

## 2015-05-17 DIAGNOSIS — K219 Gastro-esophageal reflux disease without esophagitis: Secondary | ICD-10-CM | POA: Diagnosis not present

## 2015-05-17 DIAGNOSIS — Z79899 Other long term (current) drug therapy: Secondary | ICD-10-CM | POA: Insufficient documentation

## 2015-05-17 DIAGNOSIS — R0602 Shortness of breath: Secondary | ICD-10-CM | POA: Diagnosis not present

## 2015-05-17 DIAGNOSIS — R062 Wheezing: Secondary | ICD-10-CM | POA: Diagnosis not present

## 2015-05-17 DIAGNOSIS — R059 Cough, unspecified: Secondary | ICD-10-CM

## 2015-05-17 DIAGNOSIS — Z720 Tobacco use: Secondary | ICD-10-CM | POA: Insufficient documentation

## 2015-05-17 DIAGNOSIS — R079 Chest pain, unspecified: Secondary | ICD-10-CM | POA: Insufficient documentation

## 2015-05-17 DIAGNOSIS — R05 Cough: Secondary | ICD-10-CM | POA: Insufficient documentation

## 2015-05-17 DIAGNOSIS — Z8619 Personal history of other infectious and parasitic diseases: Secondary | ICD-10-CM | POA: Diagnosis not present

## 2015-05-17 DIAGNOSIS — Z8679 Personal history of other diseases of the circulatory system: Secondary | ICD-10-CM | POA: Diagnosis not present

## 2015-05-17 DIAGNOSIS — Z590 Homelessness: Secondary | ICD-10-CM | POA: Insufficient documentation

## 2015-05-17 LAB — I-STAT TROPONIN, ED: Troponin i, poc: 0.02 ng/mL (ref 0.00–0.08)

## 2015-05-17 MED ORDER — ALBUTEROL SULFATE HFA 108 (90 BASE) MCG/ACT IN AERS
1.0000 | INHALATION_SPRAY | Freq: Once | RESPIRATORY_TRACT | Status: DC
  Filled 2015-05-17: qty 6.7

## 2015-05-17 MED ORDER — IPRATROPIUM-ALBUTEROL 0.5-2.5 (3) MG/3ML IN SOLN
3.0000 mL | Freq: Once | RESPIRATORY_TRACT | Status: AC
  Administered 2015-05-17: 3 mL via RESPIRATORY_TRACT
  Filled 2015-05-17: qty 3

## 2015-05-17 MED ORDER — BENZONATATE 100 MG PO CAPS: 100.0000 mg | ORAL_CAPSULE | Freq: Three times a day (TID) | ORAL | Status: DC

## 2015-05-17 NOTE — Progress Notes (Signed)
Reviewed pt with Burns Cm, Noah Medina for possible [place] transitional care program referral

## 2015-05-17 NOTE — Discharge Instructions (Signed)
Cough, Adult  A cough is a reflex. It helps you clear your throat and airways. A cough can help heal your body. A cough can last 2 or 3 weeks (acute) or may last more than 8 weeks (chronic). Some common causes of a cough can include an infection, allergy, or a cold. HOME CARE  Only take medicine as told by your doctor.  If given, take your medicines (antibiotics) as told. Finish them even if you start to feel better.  Use a cold steam vaporizer or humidifier in your home. This can help loosen thick spit (secretions).  Sleep so you are almost sitting up (semi-upright). Use pillows to do this. This helps reduce coughing.  Rest as needed.  Stop smoking if you smoke. GET HELP RIGHT AWAY IF:  You have yellowish-white fluid (pus) in your thick spit.  Your cough gets worse.  Your medicine does not reduce coughing, and you are losing sleep.  You cough up blood.  You have trouble breathing.  Your pain gets worse and medicine does not help.  You have a fever. MAKE SURE YOU:   Understand these instructions.  Will watch your condition.  Will get help right away if you are not doing well or get worse. Document Released: 08/13/2011 Document Revised: 04/16/2014 Document Reviewed: 08/13/2011 [facility] Patient Information 2015 [place], [place]. This information is not intended to replace advice given to you by your health care provider. Make sure you discuss any questions you have with your health care provider.  Please monitor for new or worsening signs or symptoms, follow-up immediately if any present. Please follow-up with your primary care provider for further evaluation and management.

## 2015-05-17 NOTE — ED Notes (Signed)
Per EMS: Pt from [facility].  C/o cough for one month.  States that the cough began being painful on chest wall since 0600.  States that the pain is only present when he coughs.  12 lead unremarkable.

## 2015-05-17 NOTE — ED Provider Notes (Signed)
CSN: [id number]     Arrival date & time 05/17/15  1255 History  This chart was scribed for non-physician practitioner, Okey Regal, working with Debby Freiberg, MD by Molli Posey, ED Scribe. This patient was seen in room WTR5/WTR5 and the patient's care was started at 5:25 PM.  Chief Complaint  Patient presents with  . Cough   The history is provided by the patient. No language interpreter was used.   HPI Comments:  Noah Medina is a 59 y.o. male who presents to the Emergency Department complaining of a recurrent, productive cough for the last 2 weeks. He states that he is coughing up more yellow mucus in the last week. Pt states his left chest started hurting this morning when he was coughing. He states he only experiences CP when he coughs and has no pain at rest; no pain with inspiration.Marland Kitchen He states that he is experiencing SOB with walking but says he has SOB at his baseline. He reports he has had leg swelling for the last several years. Pt reports no known sick contacts. He reports a family hx of MI prior to age 74 and COPD. Pt reports he smokes 0.5ppd. He sates he was seen at [facility] for a similar episode 1 month ago and received a chest x-ray. Pt reports he is on no medications. He denies fever, chills, nausea, vomiting, sore throat, recent weight loss or night sweats.   Past Medical History  Diagnosis Date  . Cirrhosis of liver   . Hepatitis C 03-28-12    ? 80's-prev. IV drug abuse  . 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 Blood transfusion 03-28-12    '81-hx. of bleeding ulcer  . History of bleeding ulcers 03-28-12    Hx. '81  . GERD (gastroesophageal reflux disease) 03-26-12    Hx. reflux. ulcers in past, occ. OTC med  . Incisional hernia     has drainage system from abdomin  . Subdural hematoma 12/13/12  . Alcohol abuse   . Homelessness    Past Surgical History  Procedure Laterality Date  . Umbilical hernia repair    . Hernia repair  08/24/11     ventral hernia with mesh  . Hernia repair  03'47    Umbilical  . Peptic ulcer  03-26-12    open peptic ulcer surgery repair- '81  . Incisional hernia repair  03/26/2012  . Incisional hernia repair  03/30/2012    Procedure: LAPAROSCOPIC INCISIONAL HERNIA;  Surgeon: Harl Bowie, MD;  Location: [facility];  Service: General;  Laterality: N/A;  Laparoscopic Incisional/ventral Hernia Repair with Mesh  . Tonsillectomy    . Appendectomy    . Incisional hernia repair  07/20/2012    mesh  . Incisional hernia repair  07/20/2012    Procedure: HERNIA REPAIR INCISIONAL;  Surgeon: Harl Bowie, MD;  Location: [facility];  Service: General;  Laterality: N/A;  repair of incisional hernia with Biologic mesh   Family History  Problem Relation Age of Onset  . Cancer Mother     breast  . Cancer Father     brain  . Heart disease Brother   . Cancer Sister     breast  . Cancer Sister     breast   History  Substance Use Topics  . Smoking status: Current Every Day Smoker -- 0.25 packs/day for 38 years    Types: Cigarettes  . Smokeless tobacco: Never Used  . Alcohol Use: 75.6 oz/week  126 Cans of beer per week    Review of Systems  Constitutional: Negative for fever, chills and unexpected weight change.  HENT: Negative for sore throat.   Respiratory: Positive for cough and shortness of breath.   Cardiovascular: Positive for chest pain ( Only with cough).  Gastrointestinal: Negative for nausea and vomiting.     Allergies  Atenolol and Trazodone and nefazodone  Home Medications   Prior to Admission medications   Medication Sig Start Date End Date Taking? Authorizing Provider  levETIRAcetam (KEPPRA) 500 MG tablet Take 1 tablet (500 mg total) by mouth 2 (two) times daily. 03/20/15  Yes Thurnell Lose, MD  Multiple Vitamins-Minerals (MULTIVITAMIN & MINERAL PO) Take 1 tablet by mouth daily.   Yes Historical Provider, MD  omeprazole (PRILOSEC) 20 MG capsule Take 20 mg by mouth daily.   Yes  Historical Provider, MD  amoxicillin-clavulanate (AUGMENTIN) 875-125 MG per tablet Take 1 tablet by mouth every 12 (twelve) hours. Patient not taking: Reported on 05/17/2015 04/25/15   Baron Sane, PA-C  atenolol (TENORMIN) 25 MG tablet Take 1 tablet (25 mg total) by mouth daily. Patient not taking: Reported on 03/17/2015 03/07/15   Kerrie Buffalo, NP  benzonatate (TESSALON) 100 MG capsule Take 1 capsule (100 mg total) by mouth every 8 (eight) hours. 05/17/15   Kaitlyn Szekalski, PA-C  dicyclomine (BENTYL) 10 MG capsule Take 1 capsule (10 mg total) by mouth 3 (three) times daily before meals. Patient not taking: Reported on 03/17/2015 03/08/15   [name], MD  famotidine (PEPCID) 20 MG tablet Take 1 tablet (20 mg total) by mouth 2 (two) times daily. Patient not taking: Reported on 03/17/2015 03/07/15   Kerrie Buffalo, NP  folic acid (FOLVITE) 1 MG tablet Take 1 tablet (1 mg total) by mouth daily. Patient not taking: Reported on 03/21/2015 03/20/15   Thurnell Lose, MD  lactulose (CEPHULAC) 10 G packet Take 1 packet (10 g total) by mouth 3 (three) times daily. Patient not taking: Reported on 05/17/2015 03/20/15   Thurnell Lose, MD  pantoprazole (PROTONIX) 40 MG tablet Take 1 tablet (40 mg total) by mouth daily. Patient not taking: Reported on 03/17/2015 03/07/15   Kerrie Buffalo, NP  thiamine 100 MG tablet Take 1 tablet (100 mg total) by mouth daily. Patient not taking: Reported on 03/26/2015 03/20/15   Thurnell Lose, MD  traMADol (ULTRAM) 50 MG tablet Take 1 tablet (50 mg total) by mouth once. Patient not taking: Reported on 05/17/2015 03/26/15   Carmin Muskrat, MD  traZODone (DESYREL) 50 MG tablet Take 1 tablet (50 mg total) by mouth at bedtime as needed for sleep. Patient not taking: Reported on 03/17/2015 03/07/15   Kerrie Buffalo, NP   BP 157/67 mmHg  Pulse 88  Temp(Src) 98.9 F (37.2 C) (Oral)  Resp 22  SpO2 94%   Physical Exam  Constitutional: He is oriented to person, place, and time.  He appears well-developed and well-nourished.  HENT:  Head: Normocephalic and atraumatic.  Eyes: Pupils are equal, round, and reactive to light. Right eye exhibits no discharge. Left eye exhibits no discharge.  Neck: Normal range of motion. Neck supple. No tracheal deviation present.  Cardiovascular: Normal rate, regular rhythm and normal heart sounds.   Pulmonary/Chest: Effort normal. No respiratory distress. He has wheezes. He has no rales. He exhibits no tenderness.  Wheezing bilateral, lower lobes.   Abdominal: Soft. He exhibits distension.  Musculoskeletal: Normal range of motion.  2+ pitting edema of distal extremities.  Neurological: He is alert and oriented to person, place, and time.  Skin: Skin is warm and dry.  Psychiatric: He has a normal mood and affect. His behavior is normal. Judgment and thought content normal.  Nursing note and vitals reviewed.   ED Course  Procedures  DIAGNOSTIC STUDIES: Oxygen Saturation is 93% on RA, normal by my interpretation.    COORDINATION OF CARE: 5:38 PM Discussed treatment plan with pt at bedside and pt agreed to plan.  Labs Review Labs Reviewed  Randolm Idol, ED    Imaging Review Dg Chest 2 View  05/17/2015   CLINICAL DATA:  Productive cough. Left-sided chest pain when coughing. Shortness of breath.  EXAM: CHEST  2 VIEW  COMPARISON:  03/17/2015 and 03/08/2015  FINDINGS: Heart size and pulmonary vascularity are normal and the lungs are clear. No acute osseous abnormality. Numerous surgical clips at the gastroesophageal junction.  IMPRESSION: No acute abnormality.   Electronically Signed   By: Lorriane Shire M.D.   On: 05/17/2015 18:42     EKG Interpretation 89 BPM NSR no ST changes normal EKG      MDM   Final diagnoses:  Cough   I personally performed the services described in this documentation, which was scribed in my presence. The recorded information has been reviewed and is accurate.  Labs: I-STAT troponin  normal  Imaging: DG chest- no acute abnormality  Consults: None  Therapeutics: DuoNeb  Assessment: Cough  Plan: Patient presents with a persistent cough. No signs of pulmonary edema or consolidation, he had minimal wheeze on initial evaluation resolved with albuterol therapy. Patient does not have any systemic symptoms vital signs normal. Wells 0, heart score 3, this is unlikely PE or ACS. Patient was given a meal, albuterol inhaler, and a prescription for cough medication. He was given strict return precautions, instructions follow-up with [facility] in 3 days, he verbalizes understanding and agreement for today's plan and had no further questions at the time of discharge.     Okey Regal, PA-C 05/17/15 [id number]  Debby Freiberg, MD 05/18/15 [phone]

## 2015-05-28 ENCOUNTER — Encounter (HOSPITAL_COMMUNITY): Payer: Self-pay | Admitting: Emergency Medicine

## 2015-05-28 ENCOUNTER — Emergency Department (HOSPITAL_COMMUNITY)
Admission: EM | Admit: 2015-05-28 | Discharge: 2015-05-29 | Disposition: A | Discharge status: Home / Self Care | Payer: Medicaid Other | Attending: Emergency Medicine | Admitting: Emergency Medicine

## 2015-05-28 DIAGNOSIS — Y998 Other external cause status: Secondary | ICD-10-CM | POA: Diagnosis not present

## 2015-05-28 DIAGNOSIS — Z79899 Other long term (current) drug therapy: Secondary | ICD-10-CM | POA: Diagnosis not present

## 2015-05-28 DIAGNOSIS — K219 Gastro-esophageal reflux disease without esophagitis: Secondary | ICD-10-CM | POA: Insufficient documentation

## 2015-05-28 DIAGNOSIS — Y9289 Other specified places as the place of occurrence of the external cause: Secondary | ICD-10-CM | POA: Insufficient documentation

## 2015-05-28 DIAGNOSIS — S61411A Laceration without foreign body of right hand, initial encounter: Secondary | ICD-10-CM | POA: Diagnosis present

## 2015-05-28 DIAGNOSIS — Z590 Homelessness: Secondary | ICD-10-CM | POA: Insufficient documentation

## 2015-05-28 DIAGNOSIS — Z8619 Personal history of other infectious and parasitic diseases: Secondary | ICD-10-CM | POA: Diagnosis not present

## 2015-05-28 DIAGNOSIS — Y9389 Activity, other specified: Secondary | ICD-10-CM | POA: Diagnosis not present

## 2015-05-28 DIAGNOSIS — Z720 Tobacco use: Secondary | ICD-10-CM | POA: Diagnosis not present

## 2015-05-28 DIAGNOSIS — W458XXA Other foreign body or object entering through skin, initial encounter: Secondary | ICD-10-CM | POA: Insufficient documentation

## 2015-05-28 MED ORDER — DICYCLOMINE HCL 20 MG PO TABS
10.0000 mg | ORAL_TABLET | Freq: Once | ORAL | Status: AC
  Administered 2015-05-28: 10 mg via ORAL
  Filled 2015-05-28: qty 1

## 2015-05-28 MED ORDER — DICYCLOMINE HCL 20 MG PO TABS: 20.0000 mg | ORAL_TABLET | Freq: Two times a day (BID) | ORAL | Status: DC

## 2015-05-28 MED ORDER — SUCRALFATE 1 GM/10ML PO SUSP: 1.0000 g | Freq: Three times a day (TID) | ORAL | Status: DC

## 2015-05-28 MED ORDER — GI COCKTAIL ~~LOC~~
30.0000 mL | Freq: Once | ORAL | Status: AC
  Administered 2015-05-28: 30 mL via ORAL
  Filled 2015-05-28: qty 30

## 2015-05-28 NOTE — ED Provider Notes (Signed)
MSE was initiated and I personally evaluated the patient and placed orders (if any) at  10:48 PM on May 28, 2015.  Patient initially seen in [place] with c/o extremity laceration  Patient acutely intoxicated. Complains to me of abdominal pain, not feeling well. Unreliable historian. Feel he is not appropriate for fast track evaluation.   Plan: move to acute care bed.    The patient appears stable so that the remainder of the MSE may be completed by another provider.  Charlann Lange, PA-C 05/28/15 2250  Dorie Rank, MD 05/28/15 [phone]

## 2015-05-28 NOTE — ED Provider Notes (Signed)
CSN: [id number]     Arrival date & time 05/28/15  2202 History   First MD Initiated Contact with Patient 05/28/15 2208     Chief Complaint  Patient presents with  . Extremity Laceration     (Consider location/radiation/quality/duration/timing/severity/associated sxs/prior Treatment) Patient is a 59 y.o. male presenting with skin laceration. The history is provided by the patient.  Laceration Location:  Hand Hand laceration location:  Dorsum of R hand (base of the right ring finger) Depth:  Cutaneous Bleeding: controlled   Time since incident:  3 hours Laceration mechanism:  Nail (finger) Pain details:    Quality:  Aching   Severity:  Moderate   Timing:  Constant   Progression:  Unchanged Foreign body present:  No foreign bodies Relieved by:  Nothing Worsened by:  Nothing tried Ineffective treatments:  None tried Tetanus status:  Up to date   Past Medical History  Diagnosis Date  . Cirrhosis of liver   . Hepatitis C 03-28-12    ? 80's-prev. IV drug abuse  . 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 Blood transfusion 03-28-12    '81-hx. of bleeding ulcer  . History of bleeding ulcers 03-28-12    Hx. '81  . GERD (gastroesophageal reflux disease) 03-26-12    Hx. reflux. ulcers in past, occ. OTC med  . Incisional hernia     has drainage system from abdomin  . Subdural hematoma 12/13/12  . Alcohol abuse   . Homelessness    Past Surgical History  Procedure Laterality Date  . Umbilical hernia repair    . Hernia repair  08/24/11    ventral hernia with mesh  . Hernia repair  40'98    Umbilical  . Peptic ulcer  03-26-12    open peptic ulcer surgery repair- '81  . Incisional hernia repair  03/26/2012  . Incisional hernia repair  03/30/2012    Procedure: LAPAROSCOPIC INCISIONAL HERNIA;  Surgeon: Harl Bowie, MD;  Location: [facility];  Service: General;  Laterality: N/A;  Laparoscopic Incisional/ventral Hernia Repair with Mesh  . Tonsillectomy    .  Appendectomy    . Incisional hernia repair  07/20/2012    mesh  . Incisional hernia repair  07/20/2012    Procedure: HERNIA REPAIR INCISIONAL;  Surgeon: Harl Bowie, MD;  Location: [facility];  Service: General;  Laterality: N/A;  repair of incisional hernia with Biologic mesh   Family History  Problem Relation Age of Onset  . Cancer Mother     breast  . Cancer Father     brain  . Heart disease Brother   . Cancer Sister     breast  . Cancer Sister     breast   History  Substance Use Topics  . Smoking status: Current Every Day Smoker -- 0.25 packs/day for 38 years    Types: Cigarettes  . Smokeless tobacco: Never Used  . Alcohol Use: 75.6 oz/week    126 Cans of beer per week    Review of Systems  Skin: Positive for wound.  All other systems reviewed and are negative.     Allergies  Atenolol and Trazodone and nefazodone  Home Medications   Prior to Admission medications   Medication Sig Start Date End Date Taking? Authorizing Provider  benzonatate (TESSALON) 100 MG capsule Take 1 capsule (100 mg total) by mouth every 8 (eight) hours. 05/17/15  Yes Kaitlyn Szekalski, PA-C  Multiple Vitamins-Minerals (MULTIVITAMIN & MINERAL PO) Take 1 tablet by mouth daily.  Yes Historical Provider, MD  omeprazole (PRILOSEC) 20 MG capsule Take 20 mg by mouth daily.   Yes Historical Provider, MD  amoxicillin-clavulanate (AUGMENTIN) 875-125 MG per tablet Take 1 tablet by mouth every 12 (twelve) hours. Patient not taking: Reported on 05/17/2015 04/25/15   Baron Sane, PA-C  atenolol (TENORMIN) 25 MG tablet Take 1 tablet (25 mg total) by mouth daily. Patient not taking: Reported on 03/17/2015 03/07/15   Kerrie Buffalo, NP  dicyclomine (BENTYL) 10 MG capsule Take 1 capsule (10 mg total) by mouth 3 (three) times daily before meals. Patient not taking: Reported on 03/17/2015 03/08/15   [name], MD  famotidine (PEPCID) 20 MG tablet Take 1 tablet (20 mg total) by mouth 2 (two) times  daily. Patient not taking: Reported on 03/17/2015 03/07/15   Kerrie Buffalo, NP  folic acid (FOLVITE) 1 MG tablet Take 1 tablet (1 mg total) by mouth daily. Patient not taking: Reported on 03/21/2015 03/20/15   Thurnell Lose, MD  lactulose (CEPHULAC) 10 G packet Take 1 packet (10 g total) by mouth 3 (three) times daily. Patient not taking: Reported on 05/17/2015 03/20/15   Thurnell Lose, MD  levETIRAcetam (KEPPRA) 500 MG tablet Take 1 tablet (500 mg total) by mouth 2 (two) times daily. 03/20/15   Thurnell Lose, MD  pantoprazole (PROTONIX) 40 MG tablet Take 1 tablet (40 mg total) by mouth daily. Patient not taking: Reported on 03/17/2015 03/07/15   Kerrie Buffalo, NP  thiamine 100 MG tablet Take 1 tablet (100 mg total) by mouth daily. Patient not taking: Reported on 03/26/2015 03/20/15   Thurnell Lose, MD  traMADol (ULTRAM) 50 MG tablet Take 1 tablet (50 mg total) by mouth once. Patient not taking: Reported on 05/17/2015 03/26/15   Carmin Muskrat, MD  traZODone (DESYREL) 50 MG tablet Take 1 tablet (50 mg total) by mouth at bedtime as needed for sleep. Patient not taking: Reported on 03/17/2015 03/07/15   Kerrie Buffalo, NP   BP 135/62 mmHg  Pulse 89  Temp(Src) 98.1 F (36.7 C) (Oral)  Resp 22  SpO2 96% Physical Exam  Constitutional: He is oriented to person, place, and time. He appears well-developed and well-nourished. No distress.  HENT:  Head: Normocephalic and atraumatic.  Mouth/Throat: Oropharynx is clear and moist.  Eyes: Conjunctivae are normal. Pupils are equal, round, and reactive to light.  Neck: Normal range of motion. Neck supple.  Cardiovascular: Normal rate, regular rhythm and intact distal pulses.   Pulmonary/Chest: Effort normal and breath sounds normal. No respiratory distress. He has no wheezes. He has no rales.  Abdominal: Soft. Bowel sounds are increased. There is no tenderness. There is no rigidity, no rebound, no guarding, no tenderness at McBurney's point and negative  Murphy's sign.    Musculoskeletal: Normal range of motion.  Neurological: He is alert and oriented to person, place, and time.  Gait normal  Skin: Skin is warm and dry.  Psychiatric: He has a normal mood and affect.    ED Course  Procedures (including critical care time) Labs Review Labs Reviewed - No data to display  Imaging Review No results found.   EKG Interpretation None      MDM   Final diagnoses:  None   LACERATION REPAIR Performed by: Carlisle Beers Authorized by: Carlisle Beers Consent: Verbal consent obtained. Risks and benefits: risks, benefits and alternatives were discussed Consent given by: patient Patient identity confirmed: provided demographic data Prepped and Draped in normal sterile fashion Wound explored  Laceration Location: dorsal right hand base of the fifth finger  Laceration Length: .9 cm  No Foreign Bodies seen or palpated  Irrigation method: syringe Amount of cleaning: standard  Skin closure: dermabond  Patient tolerance: Patient tolerated the procedure well with no immediate complications.  Patient states he is no longer a candidate for surgery.  Advised to stop drinking alcohol.  Will restart bentyl.  Will refer back to PMD for recheck.  Well appearing alter and gait is normal safe for discharge at this time  Kaston Faughn, MD 05/28/15 2327

## 2015-05-28 NOTE — ED Notes (Signed)
Pt has laceration on R hand that he wants evaluated. Bleeding controlled. ETOH. Alert, oriented, ambulatory.

## 2015-05-29 ENCOUNTER — Encounter (HOSPITAL_COMMUNITY): Payer: Self-pay | Admitting: Emergency Medicine

## 2015-10-02 ENCOUNTER — Emergency Department (HOSPITAL_COMMUNITY)
Admission: EM | Admit: 2015-10-02 | Discharge: 2015-10-03 | Disposition: A | Discharge status: Home / Self Care | Payer: Medicaid Other | Attending: Emergency Medicine | Admitting: Emergency Medicine

## 2015-10-02 ENCOUNTER — Encounter (HOSPITAL_COMMUNITY): Payer: Self-pay | Admitting: *Deleted

## 2015-10-02 ENCOUNTER — Emergency Department (HOSPITAL_COMMUNITY): Payer: Medicaid Other

## 2015-10-02 DIAGNOSIS — Z8679 Personal history of other diseases of the circulatory system: Secondary | ICD-10-CM | POA: Insufficient documentation

## 2015-10-02 DIAGNOSIS — Z8619 Personal history of other infectious and parasitic diseases: Secondary | ICD-10-CM | POA: Insufficient documentation

## 2015-10-02 DIAGNOSIS — K439 Ventral hernia without obstruction or gangrene: Secondary | ICD-10-CM

## 2015-10-02 DIAGNOSIS — K219 Gastro-esophageal reflux disease without esophagitis: Secondary | ICD-10-CM | POA: Insufficient documentation

## 2015-10-02 DIAGNOSIS — Z720 Tobacco use: Secondary | ICD-10-CM | POA: Insufficient documentation

## 2015-10-02 DIAGNOSIS — Z590 Homelessness: Secondary | ICD-10-CM | POA: Insufficient documentation

## 2015-10-02 DIAGNOSIS — F10120 Alcohol abuse with intoxication, uncomplicated: Secondary | ICD-10-CM | POA: Diagnosis not present

## 2015-10-02 DIAGNOSIS — Z9889 Other specified postprocedural states: Secondary | ICD-10-CM | POA: Diagnosis not present

## 2015-10-02 DIAGNOSIS — R109 Unspecified abdominal pain: Secondary | ICD-10-CM | POA: Diagnosis present

## 2015-10-02 DIAGNOSIS — Z79899 Other long term (current) drug therapy: Secondary | ICD-10-CM | POA: Diagnosis not present

## 2015-10-02 DIAGNOSIS — F10920 Alcohol use, unspecified with intoxication, uncomplicated: Secondary | ICD-10-CM

## 2015-10-02 LAB — COMPREHENSIVE METABOLIC PANEL
ALK PHOS: 73 U/L (ref 38–126)
ALT: 24 U/L (ref 17–63)
AST: 51 U/L — AB (ref 15–41)
Albumin: 3 g/dL — ABNORMAL LOW (ref 3.5–5.0)
Anion gap: 4 — ABNORMAL LOW (ref 5–15)
BUN: 12 mg/dL (ref 6–20)
CO2: 25 mmol/L (ref 22–32)
Calcium: 8.4 mg/dL — ABNORMAL LOW (ref 8.9–10.3)
Chloride: 114 mmol/L — ABNORMAL HIGH (ref 101–111)
Creatinine, Ser: 1.18 mg/dL (ref 0.61–1.24)
GFR calc Af Amer: >60 mL/min (ref >60)
GFR calc non Af Amer: >60 mL/min (ref >60)
GLUCOSE: 115 mg/dL — AB (ref 65–99)
POTASSIUM: 3.7 mmol/L (ref 3.5–5.1)
SODIUM: 143 mmol/L (ref 135–145)
Total Bilirubin: 1.9 mg/dL — ABNORMAL HIGH (ref 0.3–1.2)
Total Protein: 6.3 g/dL — ABNORMAL LOW (ref 6.5–8.1)

## 2015-10-02 LAB — LIPASE, BLOOD: Lipase: 41 U/L (ref 22–51)

## 2015-10-02 LAB — ETHANOL: Alcohol, Ethyl (B): 207 mg/dL — ABNORMAL HIGH (ref <5)

## 2015-10-02 MED ORDER — SODIUM CHLORIDE 0.9 % IV SOLN
1000.0000 mL | Freq: Once | INTRAVENOUS | Status: AC
  Administered 2015-10-02: 1000 mL via INTRAVENOUS

## 2015-10-02 MED ORDER — SODIUM CHLORIDE 0.9 % IV SOLN: 1000.0000 mL | INTRAVENOUS | Status: DC

## 2015-10-02 MED ORDER — PROMETHAZINE HCL 25 MG/ML IJ SOLN
12.5000 mg | Freq: Once | INTRAMUSCULAR | Status: AC
  Administered 2015-10-02: 12.5 mg via INTRAVENOUS
  Filled 2015-10-02: qty 1

## 2015-10-02 NOTE — ED Notes (Addendum)
Pt arrives to the ER via EMS for complaints of abd pain; pt reports that he has an inoperable hernia and that it is causing pain tonight; pt reports that he took Tylenol @ 1800 without relief; pt was picked up by EMS [facility]  Pt reports ETOH use tonight with no improvement in pain

## 2015-10-02 NOTE — ED Notes (Signed)
Pt begin shaking & staring, encouraged pt to talk; pt was able to respond "I can't".  Dr. Tomi Bamberger summoned to room.

## 2015-10-02 NOTE — ED Notes (Signed)
Pt now admits to drinking 2-3 beers qd.  Pt stated "I quit for 120 days".

## 2015-10-02 NOTE — ED Notes (Signed)
Nurse drawing labs. 

## 2015-10-02 NOTE — ED Provider Notes (Signed)
CSN: [id number]     Arrival date & time 10/02/15  1921 History   First MD Initiated Contact with Patient 10/02/15 2202     Chief Complaint  Patient presents with  . Abdominal Pain   HPI Patient presents to the emergency room for evaluation of abdominal pain.  The patient has a history of an abdominal hernia. He was told by a surgeon and is not a candidate for surgery because of his hepatitis C. Patient states he started having severe pain tonight. He tried taking some Tylenol but it did not help. He denies any trouble with vomiting or diarrhea. Patient says he had a couple of beers this morning but none since.  Past Medical History  Diagnosis Date  . Hepatic cirrhosis ([place]) 2012  . Hernia of abdominal cavity 2013  . Cirrhosis of liver ([place])   . Hepatitis C 03-28-12    ? 80's-prev. IV drug abuse  . 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 Blood transfusion 03-28-12    '81-hx. of bleeding ulcer  . History of bleeding ulcers 03-28-12    Hx. '81  . GERD (gastroesophageal reflux disease) 03-26-12    Hx. reflux. ulcers in past, occ. OTC med  . Incisional hernia     has drainage system from abdomin  . Subdural hematoma ([place]) 12/13/12  . Alcohol abuse   . Homelessness    Past Surgical History  Procedure Laterality Date  . Umbilical hernia repair    . Hernia repair  08/24/11    ventral hernia with mesh  . Hernia repair  40'98    Umbilical  . Peptic ulcer  03-26-12    open peptic ulcer surgery repair- '81  . Incisional hernia repair  03/26/2012  . Incisional hernia repair  03/30/2012    Procedure: LAPAROSCOPIC INCISIONAL HERNIA;  Surgeon: Harl Bowie, MD;  Location: [facility];  Service: General;  Laterality: N/A;  Laparoscopic Incisional/ventral Hernia Repair with Mesh  . Tonsillectomy    . Appendectomy    . Incisional hernia repair  07/20/2012    mesh  . Incisional hernia repair  07/20/2012    Procedure: HERNIA REPAIR INCISIONAL;  Surgeon: Harl Bowie, MD;   Location: [place];  Service: General;  Laterality: N/A;  repair of incisional hernia with Biologic mesh   Family History  Problem Relation Age of Onset  . Cancer Mother     breast  . Cancer Father     brain  . Heart disease Brother   . Cancer Sister     breast  . Cancer Sister     breast   Social History  Substance Use Topics  . Smoking status: Current Every Day Smoker -- 1.00 packs/day for 38 years    Types: Cigarettes  . Smokeless tobacco: Never Used  . Alcohol Use: 75.6 oz/week    126 Cans of beer per week     Comment: 3 or 4 40oz daily    Review of Systems  Constitutional: Negative for fever.  Respiratory: Negative for shortness of breath.   Cardiovascular: Negative for chest pain.  All other systems reviewed and are negative.     Allergies  Atenolol and Trazodone and nefazodone  Home Medications   Prior to Admission medications   Medication Sig Start Date End Date Taking? Authorizing Provider  amoxicillin-clavulanate (AUGMENTIN) 875-125 MG per tablet Take 1 tablet by mouth every 12 (twelve) hours. Patient not taking: Reported on 05/17/2015 04/25/15   Baron Sane, PA-C  atenolol (TENORMIN) 25 MG tablet Take 1 tablet (25 mg total) by mouth daily. Patient not taking: Reported on 03/17/2015 03/07/15   Kerrie Buffalo, NP  benzonatate (TESSALON) 100 MG capsule Take 1 capsule (100 mg total) by mouth every 8 (eight) hours. Patient not taking: Reported on 10/02/2015 05/17/15   Alvina Chou, PA-C  dicyclomine (BENTYL) 10 MG capsule Take 1 capsule (10 mg total) by mouth 3 (three) times daily before meals. Patient not taking: Reported on 03/17/2015 03/08/15   [name], MD  dicyclomine (BENTYL) 20 MG tablet Take 1 tablet (20 mg total) by mouth 2 (two) times daily. Patient not taking: Reported on 05/05/2015 03/23/15   Tanna Furry, MD  dicyclomine (BENTYL) 20 MG tablet Take 1 tablet (20 mg total) by mouth 2 (two) times daily. 05/28/15   April Palumbo, MD  famotidine  (PEPCID) 20 MG tablet Take 1 tablet (20 mg total) by mouth 2 (two) times daily. Patient not taking: Reported on 03/17/2015 03/07/15   Kerrie Buffalo, NP  folic acid (FOLVITE) 1 MG tablet Take 1 tablet (1 mg total) by mouth daily. Patient not taking: Reported on 03/21/2015 03/20/15   Thurnell Lose, MD  lactulose (CEPHULAC) 10 G packet Take 1 packet (10 g total) by mouth 3 (three) times daily. Patient not taking: Reported on 05/17/2015 03/20/15   Thurnell Lose, MD  levETIRAcetam (KEPPRA) 500 MG tablet Take 1 tablet (500 mg total) by mouth 2 (two) times daily. 03/20/15   Thurnell Lose, MD  LORazepam (ATIVAN) 1 MG tablet Take 1 tablet (1 mg total) by mouth 3 (three) times daily as needed (withdrawl). Patient not taking: Reported on 05/05/2015 03/23/15   Tanna Furry, MD  Multiple Vitamins-Minerals (CENTRUM SILVER ADULT 50+ PO) Take 1 tablet by mouth daily.    Historical Provider, MD  omeprazole (PRILOSEC) 20 MG capsule Take 1 capsule (20 mg total) by mouth 2 (two) times daily. 03/23/15   Tanna Furry, MD  pantoprazole (PROTONIX) 40 MG tablet Take 1 tablet (40 mg total) by mouth daily. Patient not taking: Reported on 03/17/2015 03/07/15   Kerrie Buffalo, NP  sucralfate (CARAFATE) 1 GM/10ML suspension Take 10 mLs (1 g total) by mouth 4 (four) times daily -  with meals and at bedtime. 05/28/15   April Palumbo, MD  thiamine 100 MG tablet Take 1 tablet (100 mg total) by mouth daily. Patient not taking: Reported on 03/26/2015 03/20/15   Thurnell Lose, MD  traMADol (ULTRAM) 50 MG tablet Take 1 tablet (50 mg total) by mouth once. Patient not taking: Reported on 05/17/2015 03/26/15   Carmin Muskrat, MD  traZODone (DESYREL) 50 MG tablet Take 1 tablet (50 mg total) by mouth at bedtime as needed for sleep. Patient not taking: Reported on 03/17/2015 03/07/15   Kerrie Buffalo, NP   BP 145/66 mmHg  Pulse 93  Temp(Src) 98.2 F (36.8 C) (Oral)  Resp 18  SpO2 97% Physical Exam  Constitutional: No distress.  HENT:  Head:  Normocephalic and atraumatic.  Right Ear: External ear normal.  Left Ear: External ear normal.  Eyes: Conjunctivae are normal. Right eye exhibits no discharge. Left eye exhibits no discharge. No scleral icterus.  Neck: Neck supple. No tracheal deviation present.  Cardiovascular: Normal rate, regular rhythm and intact distal pulses.   Pulmonary/Chest: Effort normal and breath sounds normal. No stridor. No respiratory distress. He has no wheezes. He has no rales.  Abdominal: Soft. Bowel sounds are normal. He exhibits no distension and no mass. There  is generalized tenderness. There is no rigidity, no rebound and no guarding. A hernia is present. Hernia confirmed positive in the ventral area.  Large ventral hernia, soft and easily reduced  Musculoskeletal: He exhibits no edema or tenderness.  Neurological: He is alert. He has normal strength. No cranial nerve deficit (no facial droop, extraocular movements intact, no slurred speech) or sensory deficit. He exhibits normal muscle tone. He displays no seizure activity. Coordination normal.  Skin: Skin is warm and dry. No rash noted.  Psychiatric: He has a normal mood and affect.  Nursing note and vitals reviewed.   ED Course  Procedures (including critical care time) Labs Review Labs Reviewed  CBC WITH DIFFERENTIAL/PLATELET - Abnormal; Notable for the following:    WBC 3.9 (*)    RBC 3.36 (*)    Hemoglobin 11.3 (*)    HCT 32.0 (*)    Platelets 42 (*)    All other components within normal limits  COMPREHENSIVE METABOLIC PANEL - Abnormal; Notable for the following:    Chloride 114 (*)    Glucose, Bld 115 (*)    Calcium 8.4 (*)    Total Protein 6.3 (*)    Albumin 3.0 (*)    AST 51 (*)    Total Bilirubin 1.9 (*)    Anion gap 4 (*)    All other components within normal limits  ETHANOL - Abnormal; Notable for the following:    Alcohol, Ethyl (B) 207 (*)    All other components within normal limits  URINALYSIS, ROUTINE W REFLEX MICROSCOPIC  (NOT AT [facility]) - Abnormal; Notable for the following:    Hgb urine dipstick SMALL (*)    Urobilinogen, UA 4.0 (*)    All other components within normal limits  LIPASE, BLOOD  URINE MICROSCOPIC-ADD ON    Imaging Review Dg Abd Acute W/chest  10/02/2015  CLINICAL DATA:  Lower abdominal pain for 2 days. History of abdominal hernia. EXAM: DG ABDOMEN ACUTE W/ 1V CHEST COMPARISON:  CT 07/03/2015 FINDINGS: The cardiomediastinal contours are normal. The lungs are clear. Surgical clips in the region of the GE junction. There is no free intra-abdominal air. No dilated bowel loops to suggest obstruction. Small volume of stool throughout the colon. Gallstones noted in the right upper quadrant of the abdomen. No acute osseous abnormalities are seen. IMPRESSION: 1. Normal bowel gas pattern. 2. Cholelithiasis. 3.  No acute pulmonary process. Electronically Signed   By: Jeb Levering M.D.   On: 10/02/2015 23:09   I have personally reviewed and evaluated these images and lab results as part of my medical decision-making.   MDM   Final diagnoses:  Ventral hernia without obstruction or gangrene  Alcohol intoxication, uncomplicated ([place])    Patient has a stable abdominal hernia. There is no evidence of incarceration or obstruction.  Patient denied any alcohol use today but he does appear to be intoxicated again.  She does not appear to have pancreatitis or hepatitis. Laboratory tests are otherwise unremarkable.  Pt feels better after fluids and antiemetics.  Stable for discharge    Dorie Rank, MD 10/03/15 [phone]

## 2015-10-03 LAB — CBC WITH DIFFERENTIAL/PLATELET
Basophils Absolute: 0 10*3/uL (ref 0.0–0.1)
Basophils Relative: 0 %
EOS ABS: 0.2 10*3/uL (ref 0.0–0.7)
Eosinophils Relative: 5 %
HCT: 32 % — ABNORMAL LOW (ref 39.0–52.0)
HEMOGLOBIN: 11.3 g/dL — AB (ref 13.0–17.0)
LYMPHS PCT: 38 %
Lymphs Abs: 1.5 10*3/uL (ref 0.7–4.0)
MCH: 33.6 pg (ref 26.0–34.0)
MCHC: 35.3 g/dL (ref 30.0–36.0)
MCV: 95.2 fL (ref 78.0–100.0)
Monocytes Absolute: 0.3 10*3/uL (ref 0.1–1.0)
Monocytes Relative: 8 %
NEUTROS PCT: 49 %
Neutro Abs: 1.9 10*3/uL (ref 1.7–7.7)
Platelets: 42 10*3/uL — ABNORMAL LOW (ref 150–400)
RBC: 3.36 MIL/uL — AB (ref 4.22–5.81)
RDW: 15.1 % (ref 11.5–15.5)
WBC: 3.9 10*3/uL — AB (ref 4.0–10.5)

## 2015-10-03 LAB — URINALYSIS, ROUTINE W REFLEX MICROSCOPIC
Bilirubin Urine: NEGATIVE
Glucose, UA: NEGATIVE mg/dL
KETONES UR: NEGATIVE mg/dL
Leukocytes, UA: NEGATIVE
Nitrite: NEGATIVE
PROTEIN: NEGATIVE mg/dL
Specific Gravity, Urine: 1.013 (ref 1.005–1.030)
Urobilinogen, UA: 4 mg/dL — ABNORMAL HIGH (ref 0.0–1.0)
pH: 6.5 (ref 5.0–8.0)

## 2015-10-03 LAB — URINE MICROSCOPIC-ADD ON

## 2015-10-03 NOTE — Discharge Instructions (Signed)
Alcohol Intoxication Alcohol intoxication occurs when the amount of alcohol that a person has consumed impairs his or her ability to mentally and physically function. Alcohol directly impairs the normal chemical activity of the brain. Drinking large amounts of alcohol can lead to changes in mental function and behavior, and it can cause many physical effects that can be harmful.  Alcohol intoxication can range in severity from mild to very severe. Various factors can affect the level of intoxication that occurs, such as the person's age, gender, weight, frequency of alcohol consumption, and the presence of other medical conditions (such as diabetes, seizures, or heart conditions). Dangerous levels of alcohol intoxication may occur when people drink large amounts of alcohol in a short period (binge drinking). Alcohol can also be especially dangerous when combined with certain prescription medicines or "recreational" drugs. SIGNS AND SYMPTOMS Some common signs and symptoms of mild alcohol intoxication include:  Loss of coordination.  Changes in mood and behavior.  Impaired judgment.  Slurred speech. As alcohol intoxication progresses to more severe levels, other signs and symptoms will appear. These may include:  Vomiting.  Confusion and impaired memory.  Slowed breathing.  Seizures.  Loss of consciousness. DIAGNOSIS  Your health care provider will take a medical history and perform a physical exam. You will be asked about the amount and type of alcohol you have consumed. Blood tests will be done to measure the concentration of alcohol in your blood. In many places, your blood alcohol level must be lower than 80 mg/dL (0.08%) to legally drive. However, many dangerous effects of alcohol can occur at much lower levels.  TREATMENT  People with alcohol intoxication often do not require treatment. Most of the effects of alcohol intoxication are temporary, and they go away as the alcohol naturally  leaves the body. Your health care provider will monitor your condition until you are stable enough to go home. Fluids are sometimes given through an IV access tube to help prevent dehydration.  HOME CARE INSTRUCTIONS  Do not drive after drinking alcohol.  Stay hydrated. Drink enough water and fluids to keep your urine clear or pale yellow. Avoid caffeine.   Only take over-the-counter or prescription medicines as directed by your health care provider.  SEEK MEDICAL CARE IF:   You have persistent vomiting.   You do not feel better after a few days.  You have frequent alcohol intoxication. Your health care provider can help determine if you should see a substance use treatment counselor. SEEK IMMEDIATE MEDICAL CARE IF:   You become shaky or tremble when you try to stop drinking.   You shake uncontrollably (seizure).   You throw up (vomit) blood. This may be bright red or may look like black coffee grounds.   You have blood in your stool. This may be bright red or may appear as a black, tarry, bad smelling stool.   You become lightheaded or faint.  MAKE SURE YOU:   Understand these instructions.  Will watch your condition.  Will get help right away if you are not doing well or get worse.   This information is not intended to replace advice given to you by your health care provider. Make sure you discuss any questions you have with your health care provider.   Document Released: 09/09/2005 Document Revised: 08/02/2013 Document Reviewed: 05/05/2013 Elsevier Interactive Patient Education 2016 [employer]  Hernia A hernia happens when an organ or tissue inside your body pushes out through a weak spot in the belly (  abdomen). HOME CARE  Avoid stretching or overusing (straining) the muscles near the hernia.  Do not lift anything heavier than 10 lb (4.5 kg).  Use the muscles in your leg when you lift something up. Do not use the muscles in your back.  When you cough,  try to cough gently.  Eat a diet that has a lot of fiber. Eat lots of fruits and vegetables.  Drink enough fluids to keep your pee (urine) clear or pale yellow. Try to drink 6-8 glasses of water a day.  Take medicines to make your poop soft (stool softeners) as told by your doctor.  Lose weight, if you are overweight.  Do not use any tobacco products, including cigarettes, chewing tobacco, or electronic cigarettes. If you need help quitting, ask your doctor.  Keep all follow-up visits as told by your doctor. This is important. GET HELP IF:  The skin by the hernia gets puffy (swollen) or red.  The hernia is painful. GET HELP RIGHT AWAY IF:  You have a fever.  You have belly pain that is getting worse.  You feel sick to your stomach (nauseous) or you throw up (vomit).  You cannot push the hernia back in place by gently pressing on it while you are lying down.  The hernia:  Changes in shape or size.  Is stuck outside your belly.  Changes color.  Feels hard or tender.   This information is not intended to replace advice given to you by your health care provider. Make sure you discuss any questions you have with your health care provider.   Document Released: 05/20/2010 Document Revised: 12/21/2014 Document Reviewed: 10/10/2014 Elsevier Interactive Patient Education [employer].

## 2015-11-06 ENCOUNTER — Encounter (HOSPITAL_COMMUNITY): Payer: Self-pay | Admitting: Emergency Medicine

## 2015-11-06 ENCOUNTER — Inpatient Hospital Stay (HOSPITAL_COMMUNITY)
Admission: EM | Admit: 2015-11-06 | Discharge: 2015-11-09 | DRG: 390 | Disposition: A | Discharge status: Home / Self Care | Payer: Medicaid Other | Attending: Internal Medicine | Admitting: Internal Medicine

## 2015-11-06 ENCOUNTER — Emergency Department (HOSPITAL_COMMUNITY): Payer: Medicaid Other

## 2015-11-06 DIAGNOSIS — Z79899 Other long term (current) drug therapy: Secondary | ICD-10-CM

## 2015-11-06 DIAGNOSIS — Y900 Blood alcohol level of less than 20 mg/100 ml: Secondary | ICD-10-CM | POA: Diagnosis present

## 2015-11-06 DIAGNOSIS — R112 Nausea with vomiting, unspecified: Secondary | ICD-10-CM | POA: Diagnosis present

## 2015-11-06 DIAGNOSIS — K432 Incisional hernia without obstruction or gangrene: Secondary | ICD-10-CM

## 2015-11-06 DIAGNOSIS — D6959 Other secondary thrombocytopenia: Secondary | ICD-10-CM | POA: Diagnosis present

## 2015-11-06 DIAGNOSIS — D696 Thrombocytopenia, unspecified: Secondary | ICD-10-CM | POA: Diagnosis present

## 2015-11-06 DIAGNOSIS — F17210 Nicotine dependence, cigarettes, uncomplicated: Secondary | ICD-10-CM | POA: Diagnosis present

## 2015-11-06 DIAGNOSIS — K7030 Alcoholic cirrhosis of liver without ascites: Secondary | ICD-10-CM | POA: Diagnosis present

## 2015-11-06 DIAGNOSIS — F1010 Alcohol abuse, uncomplicated: Secondary | ICD-10-CM | POA: Diagnosis present

## 2015-11-06 DIAGNOSIS — K5660 Unspecified intestinal obstruction: Principal | ICD-10-CM | POA: Diagnosis present

## 2015-11-06 DIAGNOSIS — K7460 Unspecified cirrhosis of liver: Secondary | ICD-10-CM | POA: Diagnosis present

## 2015-11-06 DIAGNOSIS — K56609 Unspecified intestinal obstruction, unspecified as to partial versus complete obstruction: Secondary | ICD-10-CM

## 2015-11-06 DIAGNOSIS — B1920 Unspecified viral hepatitis C without hepatic coma: Secondary | ICD-10-CM | POA: Diagnosis present

## 2015-11-06 DIAGNOSIS — R1084 Generalized abdominal pain: Secondary | ICD-10-CM

## 2015-11-06 DIAGNOSIS — K219 Gastro-esophageal reflux disease without esophagitis: Secondary | ICD-10-CM | POA: Diagnosis present

## 2015-11-06 DIAGNOSIS — G250 Essential tremor: Secondary | ICD-10-CM | POA: Diagnosis present

## 2015-11-06 LAB — CBC WITH DIFFERENTIAL/PLATELET
BASOS ABS: 0 10*3/uL (ref 0.0–0.1)
BASOS PCT: 0 %
EOS PCT: 4 %
Eosinophils Absolute: 0.3 10*3/uL (ref 0.0–0.7)
HCT: 38.3 % — ABNORMAL LOW (ref 39.0–52.0)
Hemoglobin: 13.4 g/dL (ref 13.0–17.0)
Lymphocytes Relative: 17 %
Lymphs Abs: 1.1 10*3/uL (ref 0.7–4.0)
MCH: 35.2 pg — ABNORMAL HIGH (ref 26.0–34.0)
MCHC: 35 g/dL (ref 30.0–36.0)
MCV: 100.5 fL — ABNORMAL HIGH (ref 78.0–100.0)
Monocytes Absolute: 0.7 10*3/uL (ref 0.1–1.0)
Monocytes Relative: 11 %
Neutro Abs: 4.2 10*3/uL (ref 1.7–7.7)
Neutrophils Relative %: 67 %
PLATELETS: 58 10*3/uL — AB (ref 150–400)
RBC: 3.81 MIL/uL — AB (ref 4.22–5.81)
RDW: 15.9 % — ABNORMAL HIGH (ref 11.5–15.5)
WBC: 6.2 10*3/uL (ref 4.0–10.5)

## 2015-11-06 MED ORDER — FENTANYL CITRATE (PF) 100 MCG/2ML IJ SOLN
50.0000 ug | Freq: Once | INTRAMUSCULAR | Status: AC
  Administered 2015-11-06: 50 ug via INTRAVENOUS
  Filled 2015-11-06: qty 2

## 2015-11-06 MED ORDER — SODIUM CHLORIDE 0.9 % IV BOLUS (SEPSIS)
1000.0000 mL | Freq: Once | INTRAVENOUS | Status: AC
  Administered 2015-11-06: 1000 mL via INTRAVENOUS

## 2015-11-06 MED ORDER — ONDANSETRON HCL 4 MG/2ML IJ SOLN
4.0000 mg | Freq: Once | INTRAMUSCULAR | Status: AC
  Administered 2015-11-06: 4 mg via INTRAVENOUS
  Filled 2015-11-06: qty 2

## 2015-11-06 NOTE — ED Provider Notes (Signed)
CSN: [id number]     Arrival date & time 11/06/15  2242 History  By signing my name below, I, Emmanuella Mensah, attest that this documentation has been prepared under the direction and in the presence of Rolland Porter, MD at 2312. Electronically Signed: Judithann Sauger, ED Scribe. 11/06/2015. 11:25 PM.      Chief Complaint  Patient presents with  . Abdominal Pain  . Emesis   The history is provided by the patient. No language interpreter was used.   HPI Comments: Noah Medina is a 59 y.o. male with a hx of ventral hernia of abdominal cavity who presents to the Emergency Department complaining of gradually worsening left lower abdominal pain at site of hernia onset 6 pm tonight. He reports associated 3 episodes of vomiting. He denies any diarrhea. He states he is passing gas. He explains that the hernia hurts worse than usual and possibly feels bigger. He thinks it may feel harder than usual. He denies being on daily pain medication. He reports that he has had an incisional hernia repair complicated by infection  in 2013 by Dr. Ninfa Linden.   PCP: Dr. Opal Sidles, [facility].  Surgeon: Dr. Ninfa Linden  Past Medical History  Diagnosis Date  . Hepatic cirrhosis ([place]) 2012  . Hernia of abdominal cavity 2013  . Cirrhosis of liver ([place])   . Hepatitis C 03-28-12    ? 80's-prev. IV drug abuse  . 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 Blood transfusion 03-28-12    '81-hx. of bleeding ulcer  . History of bleeding ulcers 03-28-12    Hx. '81  . GERD (gastroesophageal reflux disease) 03-26-12    Hx. reflux. ulcers in past, occ. OTC med  . Incisional hernia     has drainage system from abdomin  . Subdural hematoma ([place]) 12/13/12  . Alcohol abuse   . Homelessness    Past Surgical History  Procedure Laterality Date  . Umbilical hernia repair    . Hernia repair  08/24/11    ventral hernia with mesh  . Hernia repair  [id number]    Umbilical  . Peptic ulcer  03-26-12    open peptic  ulcer surgery repair- '81  . Incisional hernia repair  03/26/2012  . Incisional hernia repair  03/30/2012    Procedure: LAPAROSCOPIC INCISIONAL HERNIA;  Surgeon: Harl Bowie, MD;  Location: [facility];  Service: General;  Laterality: N/A;  Laparoscopic Incisional/ventral Hernia Repair with Mesh  . Tonsillectomy    . Appendectomy    . Incisional hernia repair  07/20/2012    mesh  . Incisional hernia repair  07/20/2012    Procedure: HERNIA REPAIR INCISIONAL;  Surgeon: Harl Bowie, MD;  Location: [place];  Service: General;  Laterality: N/A;  repair of incisional hernia with Biologic mesh   Family History  Problem Relation Age of Onset  . Cancer Mother     breast  . Cancer Father     brain  . Heart disease Brother   . Cancer Sister     breast  . Cancer Sister     breast   Social History  Substance Use Topics  . Smoking status: Current Every Day Smoker -- 1.00 packs/day for 38 years    Types: Cigarettes  . Smokeless tobacco: Never Used  . Alcohol Use: No     Comment: "not anymore. I am a recovering alcoholic."  on disability for central hand tremor He reports that he is current 4-5 cigarette smoker.  Review  of Systems  Constitutional: Negative for fever and chills.  Gastrointestinal: Positive for nausea, vomiting and abdominal pain. Negative for diarrhea.  All other systems reviewed and are negative.     Allergies  Atenolol and Trazodone and nefazodone  Home Medications   Prior to Admission medications   Medication Sig Start Date End Date Taking? Authorizing Provider  Multiple Vitamins-Minerals (CENTRUM SILVER ADULT 50+ PO) Take 1 tablet by mouth daily.   Yes Historical Provider, MD  omeprazole (PRILOSEC) 20 MG capsule Take 1 capsule (20 mg total) by mouth 2 (two) times daily. Patient taking differently: Take 20 mg by mouth daily.  03/23/15  Yes Tanna Furry, MD   BP 137/74 mmHg  Pulse 63  Temp(Src) 97.9 F (36.6 C) (Oral)  Resp 20  Ht 5\' 11"  (1.803 m)  Wt 225 lb  (102.059 kg)  BMI 31.39 kg/m2  SpO2 100%  Vital signs normal   Physical Exam  Constitutional: He is oriented to person, place, and time. He appears well-developed and well-nourished.  Non-toxic appearance. He does not appear ill. No distress.  HENT:  Head: Normocephalic and atraumatic.  Right Ear: External ear normal.  Left Ear: External ear normal.  Nose: Nose normal. No mucosal edema or rhinorrhea.  Mouth/Throat: Oropharynx is clear and moist and mucous membranes are normal. No dental abscesses or uvula swelling.  Eyes: Conjunctivae and EOM are normal. Pupils are equal, round, and reactive to light.  Neck: Normal range of motion and full passive range of motion without pain. Neck supple.  Cardiovascular: Normal rate, regular rhythm and normal heart sounds.  Exam reveals no gallop and no friction rub.   No murmur heard. Pulmonary/Chest: Effort normal and breath sounds normal. No respiratory distress. He has no wheezes. He has no rhonchi. He has no rales. He exhibits no tenderness and no crepitus.  Abdominal: Soft. Normal appearance and bowel sounds are normal. He exhibits no distension. There is no tenderness. There is no rebound and no guarding.  When he strains, he has a large central hernia. There is an area, inferior to the right of the umbilicus that feels hard and he states it is old scar tissue. There is an area superior to that but it is soft.   Musculoskeletal: Normal range of motion. He exhibits no edema or tenderness.  Moves all extremities well.   Neurological: He is alert and oriented to person, place, and time. He has normal strength. No cranial nerve deficit.  Skin: Skin is warm, dry and intact. No rash noted. No erythema. No pallor.  Psychiatric: He has a normal mood and affect. His speech is normal and behavior is normal. His mood appears not anxious.  Nursing note and vitals reviewed.   ED Course  Procedures (including critical care time)  Medications  sodium  chloride 0.9 % bolus 1,000 mL (0 mLs Intravenous Stopped 11/07/15 0041)  fentaNYL (SUBLIMAZE) injection 50 mcg (50 mcg Intravenous Given 11/06/15 2349)  ondansetron (ZOFRAN) injection 4 mg (4 mg Intravenous Given 11/06/15 2349)  iohexol (OMNIPAQUE) 300 MG/ML solution 100 mL (100 mLs Intravenous Contrast Given 11/07/15 0111)  ondansetron (ZOFRAN) injection 4 mg (4 mg Intravenous Given 11/07/15 0045)  fentaNYL (SUBLIMAZE) injection 50 mcg (50 mcg Intravenous Given 11/07/15 0223)    DIAGNOSTIC STUDIES: Oxygen Saturation is 100% on RA, normal by my interpretation.    COORDINATION OF CARE: 11:22 PM- Pt advised of plan for treatment and pt agrees. Will receive imaging for evaluation. She was given IV fluids and IV pain  and nausea medication.  Nurse reports patient is vomiting and CT scan. Patient was given additional Zofran.  After reviewing his CT results nursing staff attempted to place an NG tube however patient did not tolerate placement. Patient is aware he has an obstruction and he needs to be admitted. He verifies as he told me when he first came in if he needs to have surgery he wants to go down to [facility] where his surgeon practices.   02:24 D/W Dr Hulen Skains, surgery at [facility], states to have medicine admit, can stay at [facility] and transferred if he gets worse or can be admitted at [facility].  02:33 Dr Gasper Lloyd, hospitalist, states to admit to [facility], med-surg to Dr Hal Hope  Review the [facility] shows patient received #13 Percocet from a physician in [facility] on July 20, he then received #20 hydromorphone from a doctor in [place] on July 21.   Labs Review Results for orders placed or performed during the hospital encounter of 11/06/15  Comprehensive metabolic panel  Result Value Ref Range   Sodium 141 135 - 145 mmol/L   Potassium 3.4 (L) 3.5 - 5.1 mmol/L   Chloride 104 101 - 111 mmol/L   CO2 29 22 - 32 mmol/L   Glucose, Bld 136 (H) 65 - 99 mg/dL   BUN 14 6 - 20 mg/dL    Creatinine, Ser 1.17 0.61 - 1.24 mg/dL   Calcium 9.7 8.9 - 10.3 mg/dL   Total Protein 7.2 6.5 - 8.1 g/dL   Albumin 3.3 (L) 3.5 - 5.0 g/dL   AST 60 (H) 15 - 41 U/L   ALT 23 17 - 63 U/L   Alkaline Phosphatase 66 38 - 126 U/L   Total Bilirubin 4.7 (H) 0.3 - 1.2 mg/dL   GFR calc non Af Amer >60 >60 mL/min   GFR calc Af Amer >60 >60 mL/min   Anion gap 8 5 - 15  Ethanol  Result Value Ref Range   Alcohol, Ethyl (B) <5 <5 mg/dL  CBC with Differential  Result Value Ref Range   WBC 6.2 4.0 - 10.5 K/uL   RBC 3.81 (L) 4.22 - 5.81 MIL/uL   Hemoglobin 13.4 13.0 - 17.0 g/dL   HCT 38.3 (L) 39.0 - 52.0 %   MCV 100.5 (H) 78.0 - 100.0 fL   MCH 35.2 (H) 26.0 - 34.0 pg   MCHC 35.0 30.0 - 36.0 g/dL   RDW 15.9 (H) 11.5 - 15.5 %   Platelets 58 (L) 150 - 400 K/uL   Neutrophils Relative % 67 %   Neutro Abs 4.2 1.7 - 7.7 K/uL   Lymphocytes Relative 17 %   Lymphs Abs 1.1 0.7 - 4.0 K/uL   Monocytes Relative 11 %   Monocytes Absolute 0.7 0.1 - 1.0 K/uL   Eosinophils Relative 4 %   Eosinophils Absolute 0.3 0.0 - 0.7 K/uL   Basophils Relative 0 %   Basophils Absolute 0.0 0.0 - 0.1 K/uL  Lipase, blood  Result Value Ref Range   Lipase 37 11 - 51 U/L  Urinalysis, Routine w reflex microscopic  Result Value Ref Range   Color, Urine YELLOW YELLOW   APPearance CLEAR CLEAR   Specific Gravity, Urine 1.015 1.005 - 1.030   pH 7.5 5.0 - 8.0   Glucose, UA NEGATIVE NEGATIVE mg/dL   Hgb urine dipstick TRACE (A) NEGATIVE   Bilirubin Urine SMALL (A) NEGATIVE   Ketones, ur TRACE (A) NEGATIVE mg/dL   Protein, ur TRACE (A)  NEGATIVE mg/dL   Nitrite NEGATIVE NEGATIVE   Leukocytes, UA NEGATIVE NEGATIVE  Urine rapid drug screen (hosp performed)  Result Value Ref Range   Opiates NONE DETECTED NONE DETECTED   Cocaine NONE DETECTED NONE DETECTED   Benzodiazepines NONE DETECTED NONE DETECTED   Amphetamines NONE DETECTED NONE DETECTED   Tetrahydrocannabinol NONE DETECTED NONE DETECTED   Barbiturates NONE DETECTED NONE  DETECTED  Urine microscopic-add on  Result Value Ref Range   Squamous Epithelial / LPF 0-5 (A) NONE SEEN   WBC, UA 0-5 0 - 5 WBC/hpf   RBC / HPF 0-5 0 - 5 RBC/hpf   Bacteria, UA FEW (A) NONE SEEN   Laboratory interpretation all normal except mild hypokalemia mild elevation of the MCV consistent with vitamin B-12/folate acid deficiency     Imaging Review Ct Abdomen Pelvis W Contrast  11/07/2015  CLINICAL DATA:  History of ventral abdominal hernia. Gradually worsening left lower abdominal pain at the hernia site. Onset 6 p.m. yesterday. Vomiting. EXAM: CT ABDOMEN AND PELVIS WITH CONTRAST TECHNIQUE: Multidetector CT imaging of the abdomen and pelvis was performed using the standard protocol following bolus administration of intravenous contrast. CONTRAST:  180mL OMNIPAQUE IOHEXOL 300 MG/ML  SOLN COMPARISON:  07/03/2015 FINDINGS: Lung bases are clear. Surgical clips at the EG junction. Suggestion of wall thickening in the distal esophagus possibly indicating esophagitis or reflux disease. Liver demonstrates cirrhotic configuration with atrophy of the right lobe and prominence of the lateral segment left and caudate lobes. Nodular hepatic contour. Spleen is enlarged. Splenic varices. Cholelithiasis. No bile duct dilatation. Pancreas, adrenal glands, kidneys, abdominal aorta, inferior vena cava, and retroperitoneal lymph nodes are unremarkable. Multiple large venous collaterals extending along the right posterior pericolic gutter and connecting the dilated superior mesenteric vein with right pelvic veins. Some of the varices appear thrombosed. There is a large and rather broad-based ventral abdominal wall hernia containing multiple loops of small bowel and colon. Mildly dilated gas and fluid-filled small bowel demonstrated superior to the hernia with decompressed ileum inferior to the hernia. This likely indicates small bowel obstruction at the level of the hernia, possibly due to adhesions. There is soft  tissue scarring or infiltration around the hernia. No bowel wall thickening is appreciated. No pneumatosis. Colon is stool filled and decompressed. No free air or free fluid in the abdomen. Pelvis: Right pelvic venous collaterals demonstrated. Bladder wall is not thickened. Prostate gland is not enlarged. No free or loculated pelvic fluid collections. No pelvic mass or lymphadenopathy. Degenerative changes in the spine. No destructive bone lesions. Schmorl's nodes. IMPRESSION: 1. Ventral abdominal wall hernia with herniation of small and large bowel and scarring. Proximal small bowel obstruction is suggested. Colon is decompressed. 2. 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 3. Cholelithiasis. Electronically Signed   By: Lucienne Capers M.D.   On: 11/07/2015 01:36     Rolland Porter, MD has personally reviewed and evaluated these images and lab results as part of her medical decision-making.   EKG Interpretation None      MDM   Final diagnoses:  Generalized abdominal pain  Nausea and vomiting, vomiting of unspecified type  Recurrent ventral hernia  Small bowel obstruction (HCC)   Plan transfer to [facility] for admission  Rolland Porter, MD, FACEP   CRITICAL CARE Performed by: Rolland Porter L Total critical care time: 31 minutes Critical care time was exclusive of separately billable procedures and treating other patients. Critical care was necessary to treat or prevent  imminent or life-threatening deterioration. Critical care was time spent personally by me on the following activities: development of treatment plan with patient and/or surrogate as well as nursing, discussions with consultants, evaluation of patient's response to treatment, examination of patient, obtaining history from patient or surrogate, ordering and performing treatments and interventions, ordering and review of laboratory studies, ordering and review of radiographic studies,  pulse oximetry and re-evaluation of patient's condition.   I personally performed the services described in this documentation, which was scribed in my presence. The recorded information has been reviewed and considered.  Rolland Porter, MD, Barbette Or, MD 11/07/15 0400

## 2015-11-06 NOTE — ED Notes (Signed)
Patient complaining of lower abdominal pain and vomiting starting at 1800 tonight. States "it hurts right where I have a hernia at."

## 2015-11-07 DIAGNOSIS — R1110 Vomiting, unspecified: Secondary | ICD-10-CM

## 2015-11-07 DIAGNOSIS — F17210 Nicotine dependence, cigarettes, uncomplicated: Secondary | ICD-10-CM | POA: Diagnosis present

## 2015-11-07 DIAGNOSIS — D696 Thrombocytopenia, unspecified: Secondary | ICD-10-CM | POA: Diagnosis not present

## 2015-11-07 DIAGNOSIS — K5660 Unspecified intestinal obstruction: Secondary | ICD-10-CM | POA: Diagnosis not present

## 2015-11-07 DIAGNOSIS — K432 Incisional hernia without obstruction or gangrene: Secondary | ICD-10-CM

## 2015-11-07 DIAGNOSIS — K56609 Unspecified intestinal obstruction, unspecified as to partial versus complete obstruction: Secondary | ICD-10-CM | POA: Insufficient documentation

## 2015-11-07 DIAGNOSIS — F1010 Alcohol abuse, uncomplicated: Secondary | ICD-10-CM

## 2015-11-07 DIAGNOSIS — K7469 Other cirrhosis of liver: Secondary | ICD-10-CM | POA: Diagnosis not present

## 2015-11-07 DIAGNOSIS — G250 Essential tremor: Secondary | ICD-10-CM | POA: Diagnosis present

## 2015-11-07 DIAGNOSIS — K7030 Alcoholic cirrhosis of liver without ascites: Secondary | ICD-10-CM | POA: Diagnosis present

## 2015-11-07 DIAGNOSIS — Y900 Blood alcohol level of less than 20 mg/100 ml: Secondary | ICD-10-CM | POA: Diagnosis present

## 2015-11-07 DIAGNOSIS — K219 Gastro-esophageal reflux disease without esophagitis: Secondary | ICD-10-CM | POA: Diagnosis present

## 2015-11-07 DIAGNOSIS — R1084 Generalized abdominal pain: Secondary | ICD-10-CM | POA: Diagnosis not present

## 2015-11-07 DIAGNOSIS — K5669 Other intestinal obstruction: Secondary | ICD-10-CM

## 2015-11-07 DIAGNOSIS — D6959 Other secondary thrombocytopenia: Secondary | ICD-10-CM | POA: Diagnosis present

## 2015-11-07 DIAGNOSIS — B1920 Unspecified viral hepatitis C without hepatic coma: Secondary | ICD-10-CM | POA: Diagnosis present

## 2015-11-07 DIAGNOSIS — Z79899 Other long term (current) drug therapy: Secondary | ICD-10-CM | POA: Diagnosis not present

## 2015-11-07 LAB — BASIC METABOLIC PANEL
ANION GAP: 7 (ref 5–15)
BUN: 10 mg/dL (ref 6–20)
CHLORIDE: 106 mmol/L (ref 101–111)
CO2: 28 mmol/L (ref 22–32)
Calcium: 9.4 mg/dL (ref 8.9–10.3)
Creatinine, Ser: 1.26 mg/dL — ABNORMAL HIGH (ref 0.61–1.24)
GFR calc Af Amer: >60 mL/min (ref >60)
GLUCOSE: 138 mg/dL — AB (ref 65–99)
POTASSIUM: 4.1 mmol/L (ref 3.5–5.1)
Sodium: 141 mmol/L (ref 135–145)

## 2015-11-07 LAB — URINE MICROSCOPIC-ADD ON

## 2015-11-07 LAB — CBC
HEMATOCRIT: 38.2 % — AB (ref 39.0–52.0)
HEMOGLOBIN: 12.8 g/dL — AB (ref 13.0–17.0)
MCH: 34.4 pg — ABNORMAL HIGH (ref 26.0–34.0)
MCHC: 33.5 g/dL (ref 30.0–36.0)
MCV: 102.7 fL — AB (ref 78.0–100.0)
Platelets: 51 10*3/uL — ABNORMAL LOW (ref 150–400)
RBC: 3.72 MIL/uL — ABNORMAL LOW (ref 4.22–5.81)
RDW: 16.1 % — AB (ref 11.5–15.5)
WBC: 5.6 10*3/uL (ref 4.0–10.5)

## 2015-11-07 LAB — COMPREHENSIVE METABOLIC PANEL
ALK PHOS: 66 U/L (ref 38–126)
ALT: 23 U/L (ref 17–63)
AST: 60 U/L — AB (ref 15–41)
Albumin: 3.3 g/dL — ABNORMAL LOW (ref 3.5–5.0)
Anion gap: 8 (ref 5–15)
BUN: 14 mg/dL (ref 6–20)
CALCIUM: 9.7 mg/dL (ref 8.9–10.3)
CO2: 29 mmol/L (ref 22–32)
CREATININE: 1.17 mg/dL (ref 0.61–1.24)
Chloride: 104 mmol/L (ref 101–111)
GFR calc Af Amer: >60 mL/min (ref >60)
GFR calc non Af Amer: >60 mL/min (ref >60)
Glucose, Bld: 136 mg/dL — ABNORMAL HIGH (ref 65–99)
Potassium: 3.4 mmol/L — ABNORMAL LOW (ref 3.5–5.1)
SODIUM: 141 mmol/L (ref 135–145)
Total Bilirubin: 4.7 mg/dL — ABNORMAL HIGH (ref 0.3–1.2)
Total Protein: 7.2 g/dL (ref 6.5–8.1)

## 2015-11-07 LAB — URINALYSIS, ROUTINE W REFLEX MICROSCOPIC
GLUCOSE, UA: NEGATIVE mg/dL
Leukocytes, UA: NEGATIVE
Nitrite: NEGATIVE
PH: 7.5 (ref 5.0–8.0)
Specific Gravity, Urine: 1.015 (ref 1.005–1.030)

## 2015-11-07 LAB — RAPID URINE DRUG SCREEN, HOSP PERFORMED
Amphetamines: NOT DETECTED
Barbiturates: NOT DETECTED
Benzodiazepines: NOT DETECTED
Cocaine: NOT DETECTED
Opiates: NOT DETECTED
Tetrahydrocannabinol: NOT DETECTED

## 2015-11-07 LAB — ETHANOL: Alcohol, Ethyl (B): <5 mg/dL (ref <5)

## 2015-11-07 LAB — LIPASE, BLOOD: Lipase: 37 U/L (ref 11–51)

## 2015-11-07 LAB — MRSA PCR SCREENING: MRSA by PCR: NEGATIVE

## 2015-11-07 MED ORDER — ONDANSETRON HCL 4 MG/2ML IJ SOLN
INTRAMUSCULAR | Status: AC
  Administered 2015-11-07: 4 mg via INTRAVENOUS
  Filled 2015-11-07: qty 2

## 2015-11-07 MED ORDER — MORPHINE SULFATE (PF) 2 MG/ML IV SOLN
1.0000 mg | INTRAVENOUS | Status: DC | PRN
  Administered 2015-11-07: 2 mg via INTRAVENOUS
  Filled 2015-11-07: qty 1

## 2015-11-07 MED ORDER — FENTANYL CITRATE (PF) 100 MCG/2ML IJ SOLN
50.0000 ug | Freq: Once | INTRAMUSCULAR | Status: AC
  Administered 2015-11-07: 50 ug via INTRAVENOUS
  Filled 2015-11-07: qty 2

## 2015-11-07 MED ORDER — ONDANSETRON HCL 4 MG/2ML IJ SOLN: 4.0000 mg | Freq: Four times a day (QID) | INTRAMUSCULAR | Status: DC | PRN

## 2015-11-07 MED ORDER — OXYCODONE HCL 5 MG PO TABS: 5.0000 mg | ORAL_TABLET | Freq: Four times a day (QID) | ORAL | Status: DC | PRN

## 2015-11-07 MED ORDER — IOHEXOL 300 MG/ML  SOLN
100.0000 mL | Freq: Once | INTRAMUSCULAR | Status: AC | PRN
  Administered 2015-11-07: 100 mL via INTRAVENOUS

## 2015-11-07 MED ORDER — RIBAVIRIN 200 MG PO CAPS
200.0000 mg | ORAL_CAPSULE | Freq: Two times a day (BID) | ORAL | Status: DC
  Administered 2015-11-07: 200 mg via ORAL
  Filled 2015-11-07 (×2): qty 1

## 2015-11-07 MED ORDER — ONDANSETRON HCL 4 MG/2ML IJ SOLN
4.0000 mg | Freq: Once | INTRAMUSCULAR | Status: AC
  Administered 2015-11-07: 4 mg via INTRAVENOUS

## 2015-11-07 MED ORDER — PNEUMOCOCCAL VAC POLYVALENT 25 MCG/0.5ML IJ INJ
0.5000 mL | INJECTION | Freq: Once | INTRAMUSCULAR | Status: AC
  Administered 2015-11-07: 0.5 mL via INTRAMUSCULAR
  Filled 2015-11-07: qty 0.5

## 2015-11-07 MED ORDER — ACETAMINOPHEN 325 MG PO TABS: 650.0000 mg | ORAL_TABLET | Freq: Four times a day (QID) | ORAL | Status: DC | PRN

## 2015-11-07 MED ORDER — PROPRANOLOL HCL 10 MG PO TABS
20.0000 mg | ORAL_TABLET | Freq: Two times a day (BID) | ORAL | Status: DC
  Administered 2015-11-07 – 2015-11-09 (×5): 20 mg via ORAL
  Filled 2015-11-07 (×4): qty 2

## 2015-11-07 MED ORDER — PANTOPRAZOLE SODIUM 40 MG IV SOLR
40.0000 mg | Freq: Two times a day (BID) | INTRAVENOUS | Status: DC
  Administered 2015-11-07 – 2015-11-09 (×6): 40 mg via INTRAVENOUS
  Filled 2015-11-07 (×6): qty 40

## 2015-11-07 MED ORDER — ACETAMINOPHEN 650 MG RE SUPP: 650.0000 mg | Freq: Four times a day (QID) | RECTAL | Status: DC | PRN

## 2015-11-07 MED ORDER — ONDANSETRON HCL 4 MG PO TABS: 4.0000 mg | ORAL_TABLET | Freq: Four times a day (QID) | ORAL | Status: DC | PRN

## 2015-11-07 MED ORDER — SODIUM CHLORIDE 0.9 % IV SOLN
INTRAVENOUS | Status: DC
  Administered 2015-11-07 – 2015-11-09 (×5): via INTRAVENOUS

## 2015-11-07 MED ORDER — CETYLPYRIDINIUM CHLORIDE 0.05 % MT LIQD
7.0000 mL | Freq: Two times a day (BID) | OROMUCOSAL | Status: DC
  Administered 2015-11-07 (×2): 7 mL via OROMUCOSAL

## 2015-11-07 MED ORDER — SOFOSBUVIR 400 MG PO TABS
400.0000 mg | ORAL_TABLET | Freq: Every day | ORAL | Status: DC
  Administered 2015-11-07 – 2015-11-09 (×3): 400 mg via ORAL
  Filled 2015-11-07 (×4): qty 1

## 2015-11-07 MED ORDER — RIBAVIRIN 200 MG PO CAPS
600.0000 mg | ORAL_CAPSULE | Freq: Two times a day (BID) | ORAL | Status: DC
  Administered 2015-11-07 – 2015-11-09 (×4): 600 mg via ORAL
  Filled 2015-11-07 (×6): qty 3

## 2015-11-07 MED ORDER — HYDROMORPHONE HCL 1 MG/ML IJ SOLN
0.5000 mg | INTRAMUSCULAR | Status: DC | PRN
  Administered 2015-11-07 (×3): 1 mg via INTRAVENOUS
  Filled 2015-11-07 (×4): qty 1

## 2015-11-07 NOTE — Progress Notes (Signed)
Pt refusing NG tube insertion rt now. Stated "I does not want it inserted unless I will be put to sleep or numbed". No c/o of nausea, no vomiting noted. K.  Schorr notified, said it's okay to hold off placement rt now and they will address it later.

## 2015-11-07 NOTE — Consult Note (Signed)
Reason for Consult:vomiting Referring Physician: Dr. Nani Gasser is an 59 y.o. male.  HPI: The patient is a 59yo wm who presents with vomiting and abdominal pain that started yesterday after eating some chinese food. He has a history of multiple abdominal surgeries and has a known large ventral hernia that is recurrent. He has been operated on by Dr. Ninfa Linden in the past. He also has known cirrhosis from Hep C. He underwent a CT which suggested a small bowel obstruction most likely from scar tissue. At this point he is feeling better and denies abdominal pain. He hasn't vomited since the CT  Past Medical History  Diagnosis Date  . Hepatic cirrhosis ([place]) 2012  . Hernia of abdominal cavity 2013  . Cirrhosis of liver ([place])   . Hepatitis C 03-28-12    ? 80's-prev. IV drug abuse  . 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 Blood transfusion 03-28-12    '81-hx. of bleeding ulcer  . History of bleeding ulcers 03-28-12    Hx. '81  . GERD (gastroesophageal reflux disease) 03-26-12    Hx. reflux. ulcers in past, occ. OTC med  . Incisional hernia     has drainage system from abdomin  . Subdural hematoma ([place]) 12/13/12  . Alcohol abuse   . Homelessness     Past Surgical History  Procedure Laterality Date  . Umbilical hernia repair    . Hernia repair  08/24/11    ventral hernia with mesh  . Hernia repair  91'79    Umbilical  . Peptic ulcer  03-26-12    open peptic ulcer surgery repair- '81  . Incisional hernia repair  03/26/2012  . Incisional hernia repair  03/30/2012    Procedure: LAPAROSCOPIC INCISIONAL HERNIA;  Surgeon: Harl Bowie, MD;  Location: [facility];  Service: General;  Laterality: N/A;  Laparoscopic Incisional/ventral Hernia Repair with Mesh  . Tonsillectomy    . Appendectomy    . Incisional hernia repair  07/20/2012    mesh  . Incisional hernia repair  07/20/2012    Procedure: HERNIA REPAIR INCISIONAL;  Surgeon: Harl Bowie, MD;   Location: [facility];  Service: General;  Laterality: N/A;  repair of incisional hernia with Biologic mesh    Family History  Problem Relation Age of Onset  . Cancer Mother     breast  . Cancer Father     brain  . Heart disease Brother   . Cancer Sister     breast  . Cancer Sister     breast    Social History:  reports that he has been smoking Cigarettes.  He has a 38 pack-year smoking history. He has never used smokeless tobacco. He reports that he does not drink alcohol or use illicit drugs.  Allergies:  Allergies  Allergen Reactions  . Atenolol Nausea Only  . Trazodone And Nefazodone Other (See Comments)    Groggy all the next day.    Medications: I have reviewed the patient's current medications.  Results for orders placed or performed during the hospital encounter of 11/06/15 (from the past 48 hour(s))  Comprehensive metabolic panel     Status: Abnormal   Collection Time: 11/06/15 11:35 PM  Result Value Ref Range   Sodium 141 135 - 145 mmol/L   Potassium 3.4 (L) 3.5 - 5.1 mmol/L   Chloride 104 101 - 111 mmol/L   CO2 29 22 - 32 mmol/L   Glucose, Bld 136 (H) 65 - 99  mg/dL   BUN 14 6 - 20 mg/dL   Creatinine, Ser 1.17 0.61 - 1.24 mg/dL   Calcium 9.7 8.9 - 10.3 mg/dL   Total Protein 7.2 6.5 - 8.1 g/dL   Albumin 3.3 (L) 3.5 - 5.0 g/dL   AST 60 (H) 15 - 41 U/L   ALT 23 17 - 63 U/L   Alkaline Phosphatase 66 38 - 126 U/L   Total Bilirubin 4.7 (H) 0.3 - 1.2 mg/dL   GFR calc non Af Amer >60 >60 mL/min   GFR calc Af Amer >60 >60 mL/min    Comment: (NOTE) The eGFR has been calculated using the CKD EPI equation. This calculation has not been validated in all clinical situations. eGFR's persistently <60 mL/min signify possible Chronic Kidney Disease.    Anion gap 8 5 - 15  Ethanol     Status: None   Collection Time: 11/06/15 11:35 PM  Result Value Ref Range   Alcohol, Ethyl (B) <5 <5 mg/dL    Comment:        LOWEST DETECTABLE LIMIT FOR SERUM ALCOHOL IS 5 mg/dL FOR  MEDICAL PURPOSES ONLY   CBC with Differential     Status: Abnormal   Collection Time: 11/06/15 11:35 PM  Result Value Ref Range   WBC 6.2 4.0 - 10.5 K/uL   RBC 3.81 (L) 4.22 - 5.81 MIL/uL   Hemoglobin 13.4 13.0 - 17.0 g/dL   HCT 38.3 (L) 39.0 - 52.0 %   MCV 100.5 (H) 78.0 - 100.0 fL   MCH 35.2 (H) 26.0 - 34.0 pg   MCHC 35.0 30.0 - 36.0 g/dL   RDW 15.9 (H) 11.5 - 15.5 %   Platelets 58 (L) 150 - 400 K/uL    Comment: SPECIMEN CHECKED FOR CLOTS   Neutrophils Relative % 67 %   Neutro Abs 4.2 1.7 - 7.7 K/uL   Lymphocytes Relative 17 %   Lymphs Abs 1.1 0.7 - 4.0 K/uL   Monocytes Relative 11 %   Monocytes Absolute 0.7 0.1 - 1.0 K/uL   Eosinophils Relative 4 %   Eosinophils Absolute 0.3 0.0 - 0.7 K/uL   Basophils Relative 0 %   Basophils Absolute 0.0 0.0 - 0.1 K/uL  Lipase, blood     Status: None   Collection Time: 11/06/15 11:35 PM  Result Value Ref Range   Lipase 37 11 - 51 U/L  Urinalysis, Routine w reflex microscopic     Status: Abnormal   Collection Time: 11/07/15 12:47 AM  Result Value Ref Range   Color, Urine YELLOW YELLOW   APPearance CLEAR CLEAR   Specific Gravity, Urine 1.015 1.005 - 1.030   pH 7.5 5.0 - 8.0   Glucose, UA NEGATIVE NEGATIVE mg/dL   Hgb urine dipstick TRACE (A) NEGATIVE   Bilirubin Urine SMALL (A) NEGATIVE   Ketones, ur TRACE (A) NEGATIVE mg/dL   Protein, ur TRACE (A) NEGATIVE mg/dL   Nitrite NEGATIVE NEGATIVE   Leukocytes, UA NEGATIVE NEGATIVE  Urine rapid drug screen (hosp performed)     Status: None   Collection Time: 11/07/15 12:47 AM  Result Value Ref Range   Opiates NONE DETECTED NONE DETECTED   Cocaine NONE DETECTED NONE DETECTED   Benzodiazepines NONE DETECTED NONE DETECTED   Amphetamines NONE DETECTED NONE DETECTED   Tetrahydrocannabinol NONE DETECTED NONE DETECTED   Barbiturates NONE DETECTED NONE DETECTED    Comment:        DRUG SCREEN FOR MEDICAL PURPOSES ONLY.  IF CONFIRMATION IS NEEDED FOR ANY  PURPOSE, NOTIFY LAB WITHIN 5 DAYS.         LOWEST DETECTABLE LIMITS FOR URINE DRUG SCREEN Drug Class       Cutoff (ng/mL) Amphetamine      1000 Barbiturate      200 Benzodiazepine   491 Tricyclics       791 Opiates          300 Cocaine          300 THC              50   Urine microscopic-add on     Status: Abnormal   Collection Time: 11/07/15 12:47 AM  Result Value Ref Range   Squamous Epithelial / LPF 0-5 (A) NONE SEEN    Comment: Please note change in reference range.   WBC, UA 0-5 0 - 5 WBC/hpf    Comment: Please note change in reference range.   RBC / HPF 0-5 0 - 5 RBC/hpf    Comment: Please note change in reference range.   Bacteria, UA FEW (A) NONE SEEN    Comment: Please note change in reference range.  Basic metabolic panel     Status: Abnormal   Collection Time: 11/07/15  5:02 AM  Result Value Ref Range   Sodium 141 135 - 145 mmol/L   Potassium 4.1 3.5 - 5.1 mmol/L   Chloride 106 101 - 111 mmol/L   CO2 28 22 - 32 mmol/L   Glucose, Bld 138 (H) 65 - 99 mg/dL   BUN 10 6 - 20 mg/dL   Creatinine, Ser 1.26 (H) 0.61 - 1.24 mg/dL   Calcium 9.4 8.9 - 10.3 mg/dL   GFR calc non Af Amer >60 >60 mL/min   GFR calc Af Amer >60 >60 mL/min    Comment: (NOTE) The eGFR has been calculated using the CKD EPI equation. This calculation has not been validated in all clinical situations. eGFR's persistently <60 mL/min signify possible Chronic Kidney Disease.    Anion gap 7 5 - 15  CBC     Status: Abnormal   Collection Time: 11/07/15  5:02 AM  Result Value Ref Range   WBC 5.6 4.0 - 10.5 K/uL   RBC 3.72 (L) 4.22 - 5.81 MIL/uL   Hemoglobin 12.8 (L) 13.0 - 17.0 g/dL   HCT 38.2 (L) 39.0 - 52.0 %   MCV 102.7 (H) 78.0 - 100.0 fL   MCH 34.4 (H) 26.0 - 34.0 pg   MCHC 33.5 30.0 - 36.0 g/dL   RDW 16.1 (H) 11.5 - 15.5 %   Platelets 51 (L) 150 - 400 K/uL    Comment: CONSISTENT WITH PREVIOUS RESULT  MRSA PCR Screening     Status: None   Collection Time: 11/07/15  6:05 AM  Result Value Ref Range   MRSA by PCR NEGATIVE  NEGATIVE    Comment:        The GeneXpert MRSA Assay (FDA approved for NASAL specimens only), is one component of a comprehensive MRSA colonization surveillance program. It is not intended to diagnose MRSA infection nor to guide or monitor treatment for MRSA infections.     Ct Abdomen Pelvis W Contrast  11/07/2015  CLINICAL DATA:  History of ventral abdominal hernia. Gradually worsening left lower abdominal pain at the hernia site. Onset 6 p.m. yesterday. Vomiting. EXAM: CT ABDOMEN AND PELVIS WITH CONTRAST TECHNIQUE: Multidetector CT imaging of the abdomen and pelvis was performed using the standard protocol following bolus administration of intravenous contrast. CONTRAST:  155mL  OMNIPAQUE IOHEXOL 300 MG/ML  SOLN COMPARISON:  07/03/2015 FINDINGS: Lung bases are clear. Surgical clips at the EG junction. Suggestion of wall thickening in the distal esophagus possibly indicating esophagitis or reflux disease. Liver demonstrates cirrhotic configuration with atrophy of the right lobe and prominence of the lateral segment left and caudate lobes. Nodular hepatic contour. Spleen is enlarged. Splenic varices. Cholelithiasis. No bile duct dilatation. Pancreas, adrenal glands, kidneys, abdominal aorta, inferior vena cava, and retroperitoneal lymph nodes are unremarkable. Multiple large venous collaterals extending along the right posterior pericolic gutter and connecting the dilated superior mesenteric vein with right pelvic veins. Some of the varices appear thrombosed. There is a large and rather broad-based ventral abdominal wall hernia containing multiple loops of small bowel and colon. Mildly dilated gas and fluid-filled small bowel demonstrated superior to the hernia with decompressed ileum inferior to the hernia. This likely indicates small bowel obstruction at the level of the hernia, possibly due to adhesions. There is soft tissue scarring or infiltration around the hernia. No bowel wall thickening is  appreciated. No pneumatosis. Colon is stool filled and decompressed. No free air or free fluid in the abdomen. Pelvis: Right pelvic venous collaterals demonstrated. Bladder wall is not thickened. Prostate gland is not enlarged. No free or loculated pelvic fluid collections. No pelvic mass or lymphadenopathy. Degenerative changes in the spine. No destructive bone lesions. Schmorl's nodes. IMPRESSION: 1. Ventral abdominal wall hernia with herniation of small and large bowel and scarring. Proximal small bowel obstruction is suggested. Colon is decompressed. 2. 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 3. Cholelithiasis. Electronically Signed   By: Lucienne Capers M.D.   On: 11/07/2015 01:36    Review of Systems  Constitutional: Negative.   HENT: Negative.   Eyes: Negative.   Respiratory: Negative.   Cardiovascular: Negative.   Gastrointestinal: Positive for nausea, vomiting and abdominal pain.  Genitourinary: Negative.   Musculoskeletal: Negative.   Skin: Negative.   Neurological: Positive for tremors.  Endo/Heme/Allergies: Negative.   Psychiatric/Behavioral: Negative.    Blood pressure 147/87, pulse 72, temperature 98.3 F (36.8 C), temperature source Oral, resp. rate 20, height $RemoveBe'5\' 11"'[id number]$  (1.803 m), weight 100.4 kg (221 lb 5.5 oz), SpO2 100 %. Physical Exam  Constitutional: He is oriented to person, place, and time. He appears well-developed and well-nourished.  HENT:  Head: Normocephalic and atraumatic.  Eyes: Conjunctivae and EOM are normal. Pupils are equal, round, and reactive to light.  Neck: Normal range of motion. Neck supple.  Cardiovascular: Normal rate, regular rhythm and normal heart sounds.   Respiratory: Effort normal and breath sounds normal.  GI: Soft.  There is a large reducible ventral hernia centrally that is soft  Musculoskeletal: Normal range of motion.  Neurological: He is alert and oriented to person, place, and  time.  Skin: Skin is warm and dry.  Psychiatric: He has a normal mood and affect. His behavior is normal.    Assessment/Plan: The patient appears to have a partial small bowel obstruction. His abdomen is quite soft at this point. He refuses NG. At this point I would recommend bowel rest and close monitoring. He would like to keep taking his Hep C meds so he doesn't have to start over and I think he could try but if he vomits again then he will have to be strictly npo. Will follow  TOTH III,Berlynn Warsame S 11/07/2015, 9:35 AM

## 2015-11-07 NOTE — H&P (Signed)
[facility] Admission History and Physical       Noah Medina [id number] DOB: [birth year] DOA: 11/06/2015  Referring physician: EDP Noah Medina PCP: Noah Harvey, NP/Noah. Opal Medina, [place] [facility]) Specialists: [facility] General [facility]  Chief Complaint: ABD Pain Nausea and Vomiting  HPI: Noah Medina is a 59 y.o. male presenting with complaint of 8/10 cramping Generalized ABD Pain with Nausea and vomiting and ABD distention since 8 pm found to have an SBO on CT scan in the ED.     He has a history of multiple ABD surgeries with an initial umbilical repair in [id number] done in [place], and then he developed a right ventral hernia which was repaired by Noah Noah Medina in 08/2011 and he developed complications and required mesh removal and then replacement and then he developed a Left Ventral Hernia.   He report that this is his 3rd bout with an SBO.    He is in [place] visiting family and began to have symptoms, and in the ED he requested to go to [facility] since his previous surgeries were done by Noah. Ninfa Medina.     Review of Systems:  Constitutional: No Weight Loss, No Weight Gain, Night Sweats, Fevers, Chills, Dizziness, Light Headedness, Fatigue, or Generalized Weakness HEENT: No Headaches, Difficulty Swallowing,Tooth/Dental Problems,Sore Throat,  No Sneezing, Rhinitis, Ear Ache, Nasal Congestion, or Post Nasal Drip,  Cardio-vascular:  No Chest pain, Orthopnea, PND, Edema in Lower Extremities, Anasarca, Dizziness, Palpitations  Resp: No Dyspnea, No DOE, No Productive Cough, No Non-Productive Cough, No Hemoptysis, No Wheezing.    GI: No Heartburn, Indigestion, +Abdominal Pain, +Nausea, +Vomiting, Diarrhea, Constipation, Hematemesis, Hematochezia, Melena, Change in Bowel Habits,  Loss of Appetite  GU: No Dysuria, No Change in Color of Urine, No Urgency or Urinary Frequency, No Flank pain.  Musculoskeletal: No Joint Pain or Swelling, No Decreased Range of Motion, No Back  Pain.  Neurologic: No Syncope, No Seizures, Muscle Weakness, Paresthesia, Vision Disturbance or Loss, No Diplopia, No Vertigo, No Difficulty Walking,  Skin: No Rash or Lesions. Psych: No Change in Mood or Affect, No Depression or Anxiety, No Memory loss, No Confusion, or Hallucinations   Past Medical History  Diagnosis Date  . Hepatic cirrhosis ([place]) 2012  . Hernia of abdominal cavity 2013  . Cirrhosis of liver ([place])   . Hepatitis C 03-28-12    ? 80's-prev. IV drug abuse  . 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 Blood transfusion 03-28-12    '81-hx. of bleeding ulcer  . History of bleeding ulcers 03-28-12    Hx. '81  . GERD (gastroesophageal reflux disease) 03-26-12    Hx. reflux. ulcers in past, occ. OTC med  . Incisional hernia     has drainage system from abdomin  . Subdural hematoma ([place]) 12/13/12  . Alcohol abuse   . Homelessness      Past Surgical History  Procedure Laterality Date  . Umbilical hernia repair    . Hernia repair  08/24/11    ventral hernia with mesh  . Hernia repair  [id number]    Umbilical  . Peptic ulcer  03-26-12    open peptic ulcer surgery repair- '81  . Incisional hernia repair  03/26/2012  . Incisional hernia repair  03/30/2012    Procedure: LAPAROSCOPIC INCISIONAL HERNIA;  Surgeon: Noah Bowie, MD;  Location: [facility];  Service: General;  Laterality: N/A;  Laparoscopic Incisional/ventral Hernia Repair with Mesh  . Tonsillectomy    .  Appendectomy    . Incisional hernia repair  07/20/2012    mesh  . Incisional hernia repair  07/20/2012    Procedure: HERNIA REPAIR INCISIONAL;  Surgeon: Noah Bowie, MD;  Location: [facility];  Service: General;  Laterality: N/A;  repair of incisional hernia with Biologic mesh      Prior to Admission medications   Medication Sig Start Date End Date Taking? Authorizing Provider  Multiple Vitamins-Minerals (CENTRUM SILVER ADULT 50+ PO) Take 1 tablet by mouth daily.   Yes Historical Provider, MD   omeprazole (PRILOSEC) 20 MG capsule Take 1 capsule (20 mg total) by mouth 2 (two) times daily. Patient taking differently: Take 20 mg by mouth daily.  03/23/15  Yes Noah Furry, MD     Allergies  Allergen Reactions  . Atenolol Nausea Only  . Trazodone And Nefazodone Other (See Comments)    Groggy all the next day.    Social History:  reports that he has been smoking Cigarettes.  He has a 38 pack-year smoking history. He has never used smokeless tobacco. He reports that he does not drink alcohol or use illicit drugs.    Family History  Problem Relation Age of Onset  . Cancer Mother     breast  . Cancer Father     brain  . Heart disease Brother   . Cancer Sister     breast  . Cancer Sister     breast       Physical Exam:  GEN:  Pleasant Well Nourished and Well Developed 59 y.o. Caucasian male examined and in no acute distress; cooperative with exam Filed Vitals:   11/06/15 2252 11/07/15 0106 11/07/15 0242  BP: 137/74 144/70 141/77  Pulse: 63 62 78  Temp: 97.9 F (36.6 C)  98.2 F (36.8 C)  TempSrc: Oral  Oral  Resp: 20 22 20   Height: 5\' 11"  (1.803 m)    Weight: 102.059 kg (225 lb)    SpO2: 100% 96% 95%   Blood pressure 141/77, pulse 78, temperature 98.2 F (36.8 C), temperature source Oral, resp. rate 20, height 5\' 11"  (1.803 m), weight 102.059 kg (225 lb), SpO2 95 %. PSYCH: He is alert and oriented x4; does not appear anxious does not appear depressed; affect is normal HEENT: Normocephalic and Atraumatic, Mucous membranes pink; PERRLA; EOM intact; Fundi:  Benign;  No scleral icterus, Nares: Patent, Oropharynx: Clear, Edentulous with Upper Denture Present,    Neck:  FROM, No Cervical Lymphadenopathy nor Thyromegaly or Carotid Bruit; No JVD; Breasts:: Not examined CHEST WALL: No tenderness CHEST: Normal respiration, clear to auscultation bilaterally HEART: Regular rate and rhythm; no murmurs rubs or gallops BACK: No kyphosis or scoliosis; No CVA tenderness ABDOMEN:  Positive Bowel Sounds, + Large Ventral Hernia Soft,  Mildly Tender, No Rebound or Guarding; No Masses, No Organomegaly Rectal Exam: Not done EXTREMITIES: No Cyanosis, Clubbing, or Edema; No Ulcerations. Genitalia: not examined PULSES: 2+ and symmetric SKIN: Normal hydration no rash or ulceration CNS:  Alert and Oriented x 4, No Focal Deficits Vascular: pulses palpable throughout    Labs on Admission:  Basic Metabolic Panel:  Recent Labs Lab 11/06/15 2335  NA 141  K 3.4*  CL 104  CO2 29  GLUCOSE 136*  BUN 14  CREATININE 1.17  CALCIUM 9.7   Liver Function Tests:  Recent Labs Lab 11/06/15 2335  AST 60*  ALT 23  ALKPHOS 66  BILITOT 4.7*  PROT 7.2  ALBUMIN 3.3*    Recent Labs Lab 11/06/15  2335  LIPASE 37   No results for input(s): AMMONIA in the last 168 hours. CBC:  Recent Labs Lab 11/06/15 2335  WBC 6.2  NEUTROABS 4.2  HGB 13.4  HCT 38.3*  MCV 100.5*  PLT 58*   Cardiac Enzymes: No results for input(s): CKTOTAL, CKMB, CKMBINDEX, TROPONINI in the last 168 hours.  BNP (last 3 results)  Recent Labs  03/17/15 0630  BNP 43.7    ProBNP (last 3 results) No results for input(s): PROBNP in the last 8760 hours.  CBG: No results for input(s): GLUCAP in the last 168 hours.  Radiological Exams on Admission: Ct Abdomen Pelvis W Contrast  11/07/2015  CLINICAL DATA:  History of ventral abdominal hernia. Gradually worsening left lower abdominal pain at the hernia site. Onset 6 p.m. yesterday. Vomiting. EXAM: CT ABDOMEN AND PELVIS WITH CONTRAST TECHNIQUE: Multidetector CT imaging of the abdomen and pelvis was performed using the standard protocol following bolus administration of intravenous contrast. CONTRAST:  159mL OMNIPAQUE IOHEXOL 300 MG/ML  SOLN COMPARISON:  07/03/2015 FINDINGS: Lung bases are clear. Surgical clips at the EG junction. Suggestion of wall thickening in the distal esophagus possibly indicating esophagitis or reflux disease. Liver demonstrates  cirrhotic configuration with atrophy of the right lobe and prominence of the lateral segment left and caudate lobes. Nodular hepatic contour. Spleen is enlarged. Splenic varices. Cholelithiasis. No bile duct dilatation. Pancreas, adrenal glands, kidneys, abdominal aorta, inferior vena cava, and retroperitoneal lymph nodes are unremarkable. Multiple large venous collaterals extending along the right posterior pericolic gutter and connecting the dilated superior mesenteric vein with right pelvic veins. Some of the varices appear thrombosed. There is a large and rather broad-based ventral abdominal wall hernia containing multiple loops of small bowel and colon. Mildly dilated gas and fluid-filled small bowel demonstrated superior to the hernia with decompressed ileum inferior to the hernia. This likely indicates small bowel obstruction at the level of the hernia, possibly due to adhesions. There is soft tissue scarring or infiltration around the hernia. No bowel wall thickening is appreciated. No pneumatosis. Colon is stool filled and decompressed. No free air or free fluid in the abdomen. Pelvis: Right pelvic venous collaterals demonstrated. Bladder wall is not thickened. Prostate gland is not enlarged. No free or loculated pelvic fluid collections. No pelvic mass or lymphadenopathy. Degenerative changes in the spine. No destructive bone lesions. Schmorl's nodes. IMPRESSION: 1. Ventral abdominal wall hernia with herniation of small and large bowel and scarring. Proximal small bowel obstruction is suggested. Colon is decompressed. 2. 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 3. Cholelithiasis. Electronically Signed   By: Lucienne Capers M.D.   On: 11/07/2015 01:36      Assessment/Plan:   59 y.o. male with  Principal Problem:   1.       SBO (small bowel obstruction) (HCC)   NPO   NGT ordered and attempted without success   IV Protonix   Active  Problems:   2.      Nausea and vomiting- due to #1   PRN IV Zofran        3.      Cirrhosis ([place])- due to ETOH and Hepatitis C     4.      Thrombocytopenia ([place])- due to Cirrhosis     5.       Alcohol abuse   Reported Abstinence since 07/2015     6.       DVT Prophylaxis   SCDs  Code Status:     FULL CODE        Family Communication:   No Family Present    Disposition Plan:    Inpatient  Status        Time spent:  [facility] Pager [phone]   If [place] Please Contact the Day Rounding Team MD for [facility]  If 7PM-7AM, Please Contact Night-Floor Coverage  [email or web address] Password [email or web address] 11/07/2015, 3:00 AM     ADDENDUM:   Patient was seen and examined on 11/07/2015

## 2015-11-07 NOTE — Progress Notes (Signed)
PATIENT DETAILS Name: Noah Medina Age: 59 y.o. Sex: male Date of Birth: 08-15-1956 Admit Date: 11/06/2015 Admitting Physician Rise Patience, MD [id number], Sherrill Raring, NP  Subjective: Feels better-no vomting.   Assessment/Plan: Principal Problem: SBO (small bowel obstruction): Improved-no further vomiting, continue nothing by mouth and other supportive measures. Insert NG tube only if vomiting recurs, general surgery consulted. Repeat abdominal x-ray in a.m.  Active Problems: Essential tremors: Denies any alcohol use-on propanolol-resume.  Cirrhosis/hepatitis C: Appears stable/compensated. Resume Ribavirin and Sofosbuvir  Thrombocytopenia: Secondary to cirrhosis. At baseline.  GERD: Continue PPI  Disposition: Remain inpatient  Antimicrobial agents  See below  Anti-infectives    Start     Dose/Rate Route Frequency Ordered Stop   11/07/15 1230  ribavirin (REBETOL) capsule 200 mg     200 mg Oral 2 times daily 11/07/15 1228     11/07/15 1230  Sofosbuvir TABS 400 mg     400 mg Oral Daily 11/07/15 1228        DVT Prophylaxis: SCD's  Code Status: Full code   Family Communication None at bedside  Procedures: None  CONSULTS:  general surgery  Time spent 40 minutes-Greater than 50% of this time was spent in counseling, explanation of diagnosis, planning of further management, and coordination of care.  MEDICATIONS: Scheduled Meds: . antiseptic oral rinse  7 mL Mouth Rinse BID  . pantoprazole (PROTONIX) IV  40 mg Intravenous Q12H  . propranolol  20 mg Oral BID  . ribavirin  200 mg Oral BID  . Sofosbuvir  400 mg Oral Daily   Continuous Infusions: . sodium chloride 75 mL/hr at 11/07/15 0459   PRN Meds:.acetaminophen **OR** acetaminophen, HYDROmorphone (DILAUDID) injection, ondansetron **OR** ondansetron (ZOFRAN) IV    PHYSICAL EXAM: Vital signs in last 24 hours: Filed Vitals:   11/06/15 2252 11/07/15 0106 11/07/15 0242 11/07/15  0453  BP: 137/74 144/70 141/77 147/87  Pulse: 63 62 78 72  Temp: 97.9 F (36.6 C)  98.2 F (36.8 C) 98.3 F (36.8 C)  TempSrc: Oral  Oral Oral  Resp: 20 22 20 20   Height: 5\' 11"  (1.803 m)   5\' 11"  (1.803 m)  Weight: 102.059 kg (225 lb)   100.4 kg (221 lb 5.5 oz)  SpO2: 100% 96% 95% 100%    Weight change:  Filed Weights   11/06/15 2252 11/07/15 0453  Weight: 102.059 kg (225 lb) 100.4 kg (221 lb 5.5 oz)   Body mass index is 30.88 kg/(m^2).   Gen Exam: Awake and alert with clear speech.   Neck: Supple, No JVD.  Chest: B/L Clear.   CVS: S1 S2 Regular, no murmurs.  Abdomen: soft, BS +, ventral hernia-but non tender, non distended.  Extremities: no edema, lower extremities warm to touch. Neurologic: Non Focal.   Skin: No Rash.   Wounds: N/A.    Intake/Output from previous day:  Intake/Output Summary (Last 24 hours) at 11/07/15 1233 Last data filed at 11/07/15 0741  Gross per 24 hour  Intake  76.25 ml  Output    150 ml  Net -73.75 ml     LAB RESULTS: CBC  Recent Labs Lab 11/06/15 2335 11/07/15 0502  WBC 6.2 5.6  HGB 13.4 12.8*  HCT 38.3* 38.2*  PLT 58* 51*  MCV 100.5* 102.7*  MCH 35.2* 34.4*  MCHC 35.0 33.5  RDW 15.9* 16.1*  LYMPHSABS 1.1  --   MONOABS 0.7  --  EOSABS 0.3  --   BASOSABS 0.0  --     Chemistries   Recent Labs Lab 11/06/15 2335 11/07/15 0502  NA 141 141  K 3.4* 4.1  CL 104 106  CO2 29 28  GLUCOSE 136* 138*  BUN 14 10  CREATININE 1.17 1.26*  CALCIUM 9.7 9.4    CBG: No results for input(s): GLUCAP in the last 168 hours.  GFR Estimated Creatinine Clearance: 75.2 mL/min (by C-G formula based on Cr of 1.26).  Coagulation profile No results for input(s): INR, PROTIME in the last 168 hours.  Cardiac Enzymes No results for input(s): CKMB, TROPONINI, MYOGLOBIN in the last 168 hours.  Invalid input(s): CK  Invalid input(s): POCBNP No results for input(s): DDIMER in the last 72 hours. No results for input(s): HGBA1C in the  last 72 hours. No results for input(s): CHOL, HDL, LDLCALC, TRIG, CHOLHDL, LDLDIRECT in the last 72 hours. No results for input(s): TSH, T4TOTAL, T3FREE, THYROIDAB in the last 72 hours.  Invalid input(s): FREET3 No results for input(s): VITAMINB12, FOLATE, FERRITIN, TIBC, IRON, RETICCTPCT in the last 72 hours.  Recent Labs  11/06/15 2335  LIPASE 37    Urine Studies No results for input(s): UHGB, CRYS in the last 72 hours.  Invalid input(s): UACOL, UAPR, USPG, UPH, UTP, UGL, UKET, UBIL, UNIT, UROB, ULEU, UEPI, UWBC, URBC, UBAC, CAST, UCOM, BILUA  MICROBIOLOGY: Recent Results (from the past 240 hour(s))  MRSA PCR Screening     Status: None   Collection Time: 11/07/15  6:05 AM  Result Value Ref Range Status   MRSA by PCR NEGATIVE NEGATIVE Final    Comment:        The GeneXpert MRSA Assay (FDA approved for NASAL specimens only), is one component of a comprehensive MRSA colonization surveillance program. It is not intended to diagnose MRSA infection nor to guide or monitor treatment for MRSA infections.     RADIOLOGY STUDIES/RESULTS: Ct Abdomen Pelvis W Contrast  11/07/2015  CLINICAL DATA:  History of ventral abdominal hernia. Gradually worsening left lower abdominal pain at the hernia site. Onset 6 p.m. yesterday. Vomiting. EXAM: CT ABDOMEN AND PELVIS WITH CONTRAST TECHNIQUE: Multidetector CT imaging of the abdomen and pelvis was performed using the standard protocol following bolus administration of intravenous contrast. CONTRAST:  14mL OMNIPAQUE IOHEXOL 300 MG/ML  SOLN COMPARISON:  07/03/2015 FINDINGS: Lung bases are clear. Surgical clips at the EG junction. Suggestion of wall thickening in the distal esophagus possibly indicating esophagitis or reflux disease. Liver demonstrates cirrhotic configuration with atrophy of the right lobe and prominence of the lateral segment left and caudate lobes. Nodular hepatic contour. Spleen is enlarged. Splenic varices. Cholelithiasis. No  bile duct dilatation. Pancreas, adrenal glands, kidneys, abdominal aorta, inferior vena cava, and retroperitoneal lymph nodes are unremarkable. Multiple large venous collaterals extending along the right posterior pericolic gutter and connecting the dilated superior mesenteric vein with right pelvic veins. Some of the varices appear thrombosed. There is a large and rather broad-based ventral abdominal wall hernia containing multiple loops of small bowel and colon. Mildly dilated gas and fluid-filled small bowel demonstrated superior to the hernia with decompressed ileum inferior to the hernia. This likely indicates small bowel obstruction at the level of the hernia, possibly due to adhesions. There is soft tissue scarring or infiltration around the hernia. No bowel wall thickening is appreciated. No pneumatosis. Colon is stool filled and decompressed. No free air or free fluid in the abdomen. Pelvis: Right pelvic venous collaterals demonstrated. Bladder wall is  not thickened. Prostate gland is not enlarged. No free or loculated pelvic fluid collections. No pelvic mass or lymphadenopathy. Degenerative changes in the spine. No destructive bone lesions. Schmorl's nodes. IMPRESSION: 1. Ventral abdominal wall hernia with herniation of small and large bowel and scarring. Proximal small bowel obstruction is suggested. Colon is decompressed. 2. 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 3. Cholelithiasis. Electronically Signed   By: Lucienne Capers M.D.   On: 11/07/2015 01:36    Oren Binet, MD  [facility] Pager:[phone]  If 7PM-7AM, please contact night-coverage [email or web address] Password [facility] 11/07/2015, 12:33 PM   LOS: 0 days

## 2015-11-07 NOTE — ED Notes (Signed)
Attempted NG tube placement twice.  Pt pulling away and grabbing at hands.  Pt states "this ain't working, you gotta figure something else out".  Informed Dr Tomi Bamberger that pt refused to allow another attempt at NG placement

## 2015-11-08 ENCOUNTER — Inpatient Hospital Stay (HOSPITAL_COMMUNITY): Payer: Medicaid Other

## 2015-11-08 LAB — BASIC METABOLIC PANEL
Anion gap: 8 (ref 5–15)
BUN: 12 mg/dL (ref 6–20)
CALCIUM: 8.2 mg/dL — AB (ref 8.9–10.3)
CHLORIDE: 110 mmol/L (ref 101–111)
CO2: 23 mmol/L (ref 22–32)
CREATININE: 1.17 mg/dL (ref 0.61–1.24)
GFR calc non Af Amer: >60 mL/min (ref >60)
Glucose, Bld: 77 mg/dL (ref 65–99)
Potassium: 3.6 mmol/L (ref 3.5–5.1)
SODIUM: 141 mmol/L (ref 135–145)

## 2015-11-08 LAB — CBC
HCT: 34.3 % — ABNORMAL LOW (ref 39.0–52.0)
HEMOGLOBIN: 11.2 g/dL — AB (ref 13.0–17.0)
MCH: 34.3 pg — AB (ref 26.0–34.0)
MCHC: 32.7 g/dL (ref 30.0–36.0)
MCV: 104.9 fL — ABNORMAL HIGH (ref 78.0–100.0)
Platelets: 46 10*3/uL — ABNORMAL LOW (ref 150–400)
RBC: 3.27 MIL/uL — ABNORMAL LOW (ref 4.22–5.81)
RDW: 16.6 % — AB (ref 11.5–15.5)
WBC: 4.9 10*3/uL (ref 4.0–10.5)

## 2015-11-08 NOTE — Progress Notes (Signed)
Subjective: No complaints. Feels good today  Objective: Vital signs in last 24 hours: Temp:  [98.2 F (36.8 C)-99.2 F (37.3 C)] 98.9 F (37.2 C) (11/25 0603) Pulse Rate:  [63-72] 72 (11/25 0603) Resp:  [17-19] 18 (11/25 0603) BP: (121-142)/(57-78) 121/59 mmHg (11/25 0603) SpO2:  [95 %-98 %] 95 % (11/25 0603) Last BM Date: 11/06/15  Intake/Output from previous day: 11/24 0701 - 11/25 0700 In: 2050.3 [P.O.:175; I.V.:1875.3] Out: 1000 [Urine:1000] Intake/Output this shift: Total I/O In: -  Out: 150 [Urine:150]  Resp: clear to auscultation bilaterally Cardio: regular rate and rhythm GI: soft, nontender. hernia soft and reducible. good bs  Lab Results:   Recent Labs  11/07/15 0502 11/08/15 0449  WBC 5.6 4.9  HGB 12.8* 11.2*  HCT 38.2* 34.3*  PLT 51* 46*   BMET  Recent Labs  11/07/15 0502 11/08/15 0449  NA 141 141  K 4.1 3.6  CL 106 110  CO2 28 23  GLUCOSE 138* 77  BUN 10 12  CREATININE 1.26* 1.17  CALCIUM 9.4 8.2*   PT/INR No results for input(s): LABPROT, INR in the last 72 hours. ABG No results for input(s): PHART, HCO3 in the last 72 hours.  Invalid input(s): PCO2, PO2  Studies/Results: Ct Abdomen Pelvis W Contrast  11/07/2015  CLINICAL DATA:  History of ventral abdominal hernia. Gradually worsening left lower abdominal pain at the hernia site. Onset 6 p.m. yesterday. Vomiting. EXAM: CT ABDOMEN AND PELVIS WITH CONTRAST TECHNIQUE: Multidetector CT imaging of the abdomen and pelvis was performed using the standard protocol following bolus administration of intravenous contrast. CONTRAST:  150mL OMNIPAQUE IOHEXOL 300 MG/ML  SOLN COMPARISON:  07/03/2015 FINDINGS: Lung bases are clear. Surgical clips at the EG junction. Suggestion of wall thickening in the distal esophagus possibly indicating esophagitis or reflux disease. Liver demonstrates cirrhotic configuration with atrophy of the right lobe and prominence of the lateral segment left and caudate  lobes. Nodular hepatic contour. Spleen is enlarged. Splenic varices. Cholelithiasis. No bile duct dilatation. Pancreas, adrenal glands, kidneys, abdominal aorta, inferior vena cava, and retroperitoneal lymph nodes are unremarkable. Multiple large venous collaterals extending along the right posterior pericolic gutter and connecting the dilated superior mesenteric vein with right pelvic veins. Some of the varices appear thrombosed. There is a large and rather broad-based ventral abdominal wall hernia containing multiple loops of small bowel and colon. Mildly dilated gas and fluid-filled small bowel demonstrated superior to the hernia with decompressed ileum inferior to the hernia. This likely indicates small bowel obstruction at the level of the hernia, possibly due to adhesions. There is soft tissue scarring or infiltration around the hernia. No bowel wall thickening is appreciated. No pneumatosis. Colon is stool filled and decompressed. No free air or free fluid in the abdomen. Pelvis: Right pelvic venous collaterals demonstrated. Bladder wall is not thickened. Prostate gland is not enlarged. No free or loculated pelvic fluid collections. No pelvic mass or lymphadenopathy. Degenerative changes in the spine. No destructive bone lesions. Schmorl's nodes. IMPRESSION: 1. Ventral abdominal wall hernia with herniation of small and large bowel and scarring. Proximal small bowel obstruction is suggested. Colon is decompressed. 2. 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 3. Cholelithiasis. Electronically Signed   By: Lucienne Capers M.D.   On: 11/07/2015 01:36   Dg Abd 2 Views  11/08/2015  CLINICAL DATA:  Small bowel obstruction.  Abdominal pain. EXAM: ABDOMEN - 2 VIEW COMPARISON:  CT of the abdomen and pelvis 11/07/2015. FINDINGS: Moderate  stool is present in the ascending colon. Previously suggested small bowel obstruction has resolved. Gas in the distal colon  is within normal limits. Small bowel pattern is normal. Gallstones are again noted. The lung bases are clear. Surgical clips are present at the GE junction. IMPRESSION: 1. Interval resolution of small bowel obstruction. 2. Moderate stool is present in the ascending colon without obstruction. 3. Cholelithiasis. 4. Surgical clips are present at the GE junction. Electronically Signed   By: San Morelle M.D.   On: 11/08/2015 07:44    Anti-infectives: Anti-infectives    Start     Dose/Rate Route Frequency Ordered Stop   11/07/15 2200  ribavirin (REBETOL) capsule 600 mg     600 mg Oral 2 times daily 11/07/15 1451     11/07/15 1400  ribavirin (REBETOL) capsule 200 mg  Status:  Discontinued     200 mg Oral 2 times daily 11/07/15 1228 11/07/15 1451   11/07/15 1400  Sofosbuvir TABS 400 mg     400 mg Oral Daily 11/07/15 1228        Assessment/Plan: s/p * No surgery found * Advance diet. Start clears today and see how he does Continue antiviral meds ambulate  LOS: 1 day    TOTH III,PAUL S 11/08/2015

## 2015-11-08 NOTE — Care Management Note (Signed)
Case Management Note  Patient Details  Name: RAYME STETZ MRN: [id number] Date of Birth: Mar 04, 1956  Subjective/Objective:                    Action/Plan:  Initial UR completed. Expected Discharge Date:  11/11/15               Expected Discharge Plan:  Home/Self Care  In-House Referral:     Discharge planning Services     Post Acute Care Choice:    Choice offered to:     DME Arranged:    DME Agency:     HH Arranged:    HH Agency:     Status of Service:  In process, will continue to follow  Medicare Important Message Given:    Date Medicare IM Given:    Medicare IM give by:    Date Additional Medicare IM Given:    Additional Medicare Important Message give by:     If discussed at [place] of Stay Meetings, dates discussed:    Additional Comments:  Marilu Favre, RN 11/08/2015, 8:20 AM

## 2015-11-08 NOTE — Progress Notes (Addendum)
PATIENT DETAILS Name: Noah Medina Age: 59 y.o. Sex: male Date of Birth: Mar 07, 1956 Admit Date: 11/06/2015 Admitting Physician Rise Patience, MD [id number], Sherrill Raring, NP  Subjective: Feels better-no vomting. Passing flatus but no BM yet. No further abdominal pain.   Assessment/Plan: Principal Problem: SBO (small bowel obstruction): Improved-no further vomiting/abdominal pain-passing flatus-abdominal x-ray shows  improvement as well. Start clear liquids, continue other supportive measures. Follow clinical course, general surgery following.a.m.  Active Problems: Essential tremors: Denies any recent alcohol use (last use in June 2016)-on propanolol-resume.  Cirrhosis/hepatitis C: Appears stable/compensated. Continue Ribavirin and Sofosbuvir  Thrombocytopenia: Secondary to cirrhosis. At baseline.  GERD: Continue PPI  History of alcohol abuse: Claims to be abstinent since June 2016. Follow  Disposition: Remain inpatient-home when tolerating regular diet-suspect 1-2 more additional days  Antimicrobial agents  See below  Anti-infectives    Start     Dose/Rate Route Frequency Ordered Stop   11/07/15 2200  ribavirin (REBETOL) capsule 600 mg     600 mg Oral 2 times daily 11/07/15 1451     11/07/15 1400  ribavirin (REBETOL) capsule 200 mg  Status:  Discontinued     200 mg Oral 2 times daily 11/07/15 1228 11/07/15 1451   11/07/15 1400  Sofosbuvir TABS 400 mg     400 mg Oral Daily 11/07/15 1228        DVT Prophylaxis: SCD's  Code Status: Full code   Family Communication None at bedside  Procedures: None  CONSULTS:  general surgery  Time spent 20 minutes-Greater than 50% of this time was spent in counseling, explanation of diagnosis, planning of further management, and coordination of care.  MEDICATIONS: Scheduled Meds: . pantoprazole (PROTONIX) IV  40 mg Intravenous Q12H  . propranolol  20 mg Oral BID  . ribavirin  600 mg Oral BID  .  Sofosbuvir  400 mg Oral Daily   Continuous Infusions: . sodium chloride 75 mL/hr at 11/08/15 0612   PRN Meds:.acetaminophen **OR** acetaminophen, HYDROmorphone (DILAUDID) injection, morphine injection, ondansetron **OR** ondansetron (ZOFRAN) IV, oxyCODONE    PHYSICAL EXAM: Vital signs in last 24 hours: Filed Vitals:   11/07/15 2139 11/08/15 0111 11/08/15 0603 11/08/15 1050  BP: 128/57 126/60 121/59 134/72  Pulse: 63 65 72 73  Temp: 98.7 F (37.1 C) 99.2 F (37.3 C) 98.9 F (37.2 C) 98.4 F (36.9 C)  TempSrc: Oral Oral Oral Oral  Resp: 17 18 18 18   Height:      Weight:      SpO2: 96% 98% 95% 99%    Weight change:  Filed Weights   11/06/15 2252 11/07/15 0453  Weight: 102.059 kg (225 lb) 100.4 kg (221 lb 5.5 oz)   Body mass index is 30.88 kg/(m^2).   Gen Exam: Awake and alert with clear speech.   Neck: Supple, No JVD.  Chest: B/L Clear.   CVS: S1 S2 Regular, no murmurs.  Abdomen: soft, BS +, ventral hernia-but non tender, non distended.  Extremities: no edema, lower extremities warm to touch. Neurologic: Non Focal.   Skin: No Rash.   Wounds: N/A.    Intake/Output from previous day:  Intake/Output Summary (Last 24 hours) at 11/08/15 1212 Last data filed at 11/08/15 1052  Gross per 24 hour  Intake 2170.25 ml  Output    975 ml  Net 1195.25 ml     LAB RESULTS: CBC  Recent Labs Lab 11/06/15 2335 11/07/15 0502 11/08/15  0449  WBC 6.2 5.6 4.9  HGB 13.4 12.8* 11.2*  HCT 38.3* 38.2* 34.3*  PLT 58* 51* 46*  MCV 100.5* 102.7* 104.9*  MCH 35.2* 34.4* 34.3*  MCHC 35.0 33.5 32.7  RDW 15.9* 16.1* 16.6*  LYMPHSABS 1.1  --   --   MONOABS 0.7  --   --   EOSABS 0.3  --   --   BASOSABS 0.0  --   --     Chemistries   Recent Labs Lab 11/06/15 2335 11/07/15 0502 11/08/15 0449  NA 141 141 141  K 3.4* 4.1 3.6  CL 104 106 110  CO2 29 28 23   GLUCOSE 136* 138* 77  BUN 14 10 12   CREATININE 1.17 1.26* 1.17  CALCIUM 9.7 9.4 8.2*    CBG: No results for  input(s): GLUCAP in the last 168 hours.  GFR Estimated Creatinine Clearance: 81 mL/min (by C-G formula based on Cr of 1.17).  Coagulation profile No results for input(s): INR, PROTIME in the last 168 hours.  Cardiac Enzymes No results for input(s): CKMB, TROPONINI, MYOGLOBIN in the last 168 hours.  Invalid input(s): CK  Invalid input(s): POCBNP No results for input(s): DDIMER in the last 72 hours. No results for input(s): HGBA1C in the last 72 hours. No results for input(s): CHOL, HDL, LDLCALC, TRIG, CHOLHDL, LDLDIRECT in the last 72 hours. No results for input(s): TSH, T4TOTAL, T3FREE, THYROIDAB in the last 72 hours.  Invalid input(s): FREET3 No results for input(s): VITAMINB12, FOLATE, FERRITIN, TIBC, IRON, RETICCTPCT in the last 72 hours.  Recent Labs  11/06/15 2335  LIPASE 37    Urine Studies No results for input(s): UHGB, CRYS in the last 72 hours.  Invalid input(s): UACOL, UAPR, USPG, UPH, UTP, UGL, UKET, UBIL, UNIT, UROB, ULEU, UEPI, UWBC, URBC, UBAC, CAST, UCOM, BILUA  MICROBIOLOGY: Recent Results (from the past 240 hour(s))  MRSA PCR Screening     Status: None   Collection Time: 11/07/15  6:05 AM  Result Value Ref Range Status   MRSA by PCR NEGATIVE NEGATIVE Final    Comment:        The GeneXpert MRSA Assay (FDA approved for NASAL specimens only), is one component of a comprehensive MRSA colonization surveillance program. It is not intended to diagnose MRSA infection nor to guide or monitor treatment for MRSA infections.     RADIOLOGY STUDIES/RESULTS: Ct Abdomen Pelvis W Contrast  11/07/2015  CLINICAL DATA:  History of ventral abdominal hernia. Gradually worsening left lower abdominal pain at the hernia site. Onset 6 p.m. yesterday. Vomiting. EXAM: CT ABDOMEN AND PELVIS WITH CONTRAST TECHNIQUE: Multidetector CT imaging of the abdomen and pelvis was performed using the standard protocol following bolus administration of intravenous contrast. CONTRAST:   172mL OMNIPAQUE IOHEXOL 300 MG/ML  SOLN COMPARISON:  07/03/2015 FINDINGS: Lung bases are clear. Surgical clips at the EG junction. Suggestion of wall thickening in the distal esophagus possibly indicating esophagitis or reflux disease. Liver demonstrates cirrhotic configuration with atrophy of the right lobe and prominence of the lateral segment left and caudate lobes. Nodular hepatic contour. Spleen is enlarged. Splenic varices. Cholelithiasis. No bile duct dilatation. Pancreas, adrenal glands, kidneys, abdominal aorta, inferior vena cava, and retroperitoneal lymph nodes are unremarkable. Multiple large venous collaterals extending along the right posterior pericolic gutter and connecting the dilated superior mesenteric vein with right pelvic veins. Some of the varices appear thrombosed. There is a large and rather broad-based ventral abdominal wall hernia containing multiple loops of small bowel and colon. Mildly  dilated gas and fluid-filled small bowel demonstrated superior to the hernia with decompressed ileum inferior to the hernia. This likely indicates small bowel obstruction at the level of the hernia, possibly due to adhesions. There is soft tissue scarring or infiltration around the hernia. No bowel wall thickening is appreciated. No pneumatosis. Colon is stool filled and decompressed. No free air or free fluid in the abdomen. Pelvis: Right pelvic venous collaterals demonstrated. Bladder wall is not thickened. Prostate gland is not enlarged. No free or loculated pelvic fluid collections. No pelvic mass or lymphadenopathy. Degenerative changes in the spine. No destructive bone lesions. Schmorl's nodes. IMPRESSION: 1. Ventral abdominal wall hernia with herniation of small and large bowel and scarring. Proximal small bowel obstruction is suggested. Colon is decompressed. 2. 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 3. Cholelithiasis. Electronically  Signed   By: Lucienne Capers M.D.   On: 11/07/2015 01:36   Dg Abd 2 Views  11/08/2015  CLINICAL DATA:  Small bowel obstruction.  Abdominal pain. EXAM: ABDOMEN - 2 VIEW COMPARISON:  CT of the abdomen and pelvis 11/07/2015. FINDINGS: Moderate stool is present in the ascending colon. Previously suggested small bowel obstruction has resolved. Gas in the distal colon is within normal limits. Small bowel pattern is normal. Gallstones are again noted. The lung bases are clear. Surgical clips are present at the GE junction. IMPRESSION: 1. Interval resolution of small bowel obstruction. 2. Moderate stool is present in the ascending colon without obstruction. 3. Cholelithiasis. 4. Surgical clips are present at the GE junction. Electronically Signed   By: San Morelle M.D.   On: 11/08/2015 07:44    Oren Binet, MD  [facility] Pager:[phone]  If 7PM-7AM, please contact night-coverage [email or web address] Password [email or web address] 11/08/2015, 12:12 PM   LOS: 1 day

## 2015-11-09 NOTE — Discharge Planning (Signed)
Home meds returned to pt from cart fill in pharmacy. AVS given and pt verbalizes understanding. Dc ambulatory to private car home with all personal belongings, accompanied by family.

## 2015-11-09 NOTE — Discharge Summary (Signed)
PATIENT DETAILS Name: Noah Medina Age: 59 y.o. Sex: male Date of Birth: Jul 07, 1956 MRN: [id number]. Admitting Physician: Rise Patience, MD [id number], Sherrill Raring, NP  Admit Date: 11/06/2015 Discharge date: 11/09/2015  Recommendations for Outpatient Follow-up:  1. Please repeat CBC/BMET at next visit   PRIMARY DISCHARGE DIAGNOSIS:  Principal Problem:   SBO (small bowel obstruction) (HCC) Active Problems:   Cirrhosis (HCC)   Thrombocytopenia (HCC)   Alcohol abuse   Nausea and vomiting      PAST MEDICAL HISTORY: Past Medical History  Diagnosis Date  . Hepatic cirrhosis ([place]) 2012  . Hernia of abdominal cavity 2013  . Cirrhosis of liver ([place])   . Hepatitis C 03-28-12    ? 80's-prev. IV drug abuse  . 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 Blood transfusion 03-28-12    '81-hx. of bleeding ulcer  . History of bleeding ulcers 03-28-12    Hx. '81  . GERD (gastroesophageal reflux disease) 03-26-12    Hx. reflux. ulcers in past, occ. OTC med  . Incisional hernia     has drainage system from abdomin  . Subdural hematoma ([place]) 12/13/12  . Alcohol abuse   . Homelessness     DISCHARGE MEDICATIONS: Current Discharge Medication List    CONTINUE these medications which have NOT CHANGED   Details  Multiple Vitamins-Minerals (CENTRUM SILVER ADULT 50+ PO) Take 1 tablet by mouth daily.    omeprazole (PRILOSEC) 20 MG capsule Take 1 capsule (20 mg total) by mouth 2 (two) times daily. Qty: 60 capsule, Refills: 0    propranolol (INDERAL) 20 MG tablet Take 20 mg by mouth 2 (two) times daily.    ribavirin (REBETOL) 200 MG capsule Take 600 mg by mouth 2 (two) times daily.     Sofosbuvir 400 MG TABS Take 400 mg by mouth daily.        ALLERGIES:   Allergies  Allergen Reactions  . Atenolol Nausea Only  . Trazodone And Nefazodone Other (See Comments)    Groggy all the next day.    BRIEF HPI:  See H&P, Labs, Consult and Test reports for all  details in brief, patient was admitted for evaluation of persistent vomiting and abd pain.  CONSULTATIONS:   general surgery  PERTINENT RADIOLOGIC STUDIES: Ct Abdomen Pelvis W Contrast  11/07/2015  CLINICAL DATA:  History of ventral abdominal hernia. Gradually worsening left lower abdominal pain at the hernia site. Onset 6 p.m. yesterday. Vomiting. EXAM: CT ABDOMEN AND PELVIS WITH CONTRAST TECHNIQUE: Multidetector CT imaging of the abdomen and pelvis was performed using the standard protocol following bolus administration of intravenous contrast. CONTRAST:  115mL OMNIPAQUE IOHEXOL 300 MG/ML  SOLN COMPARISON:  07/03/2015 FINDINGS: Lung bases are clear. Surgical clips at the EG junction. Suggestion of wall thickening in the distal esophagus possibly indicating esophagitis or reflux disease. Liver demonstrates cirrhotic configuration with atrophy of the right lobe and prominence of the lateral segment left and caudate lobes. Nodular hepatic contour. Spleen is enlarged. Splenic varices. Cholelithiasis. No bile duct dilatation. Pancreas, adrenal glands, kidneys, abdominal aorta, inferior vena cava, and retroperitoneal lymph nodes are unremarkable. Multiple large venous collaterals extending along the right posterior pericolic gutter and connecting the dilated superior mesenteric vein with right pelvic veins. Some of the varices appear thrombosed. There is a large and rather broad-based ventral abdominal wall hernia containing multiple loops of small bowel and colon. Mildly dilated gas and fluid-filled small bowel demonstrated superior to the hernia with decompressed ileum inferior  to the hernia. This likely indicates small bowel obstruction at the level of the hernia, possibly due to adhesions. There is soft tissue scarring or infiltration around the hernia. No bowel wall thickening is appreciated. No pneumatosis. Colon is stool filled and decompressed. No free air or free fluid in the abdomen. Pelvis: Right  pelvic venous collaterals demonstrated. Bladder wall is not thickened. Prostate gland is not enlarged. No free or loculated pelvic fluid collections. No pelvic mass or lymphadenopathy. Degenerative changes in the spine. No destructive bone lesions. Schmorl's nodes. IMPRESSION: 1. Ventral abdominal wall hernia with herniation of small and large bowel and scarring. Proximal small bowel obstruction is suggested. Colon is decompressed. 2. 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 3. Cholelithiasis. Electronically Signed   By: Lucienne Capers M.D.   On: 11/07/2015 01:36   Dg Abd 2 Views  11/08/2015  CLINICAL DATA:  Small bowel obstruction.  Abdominal pain. EXAM: ABDOMEN - 2 VIEW COMPARISON:  CT of the abdomen and pelvis 11/07/2015. FINDINGS: Moderate stool is present in the ascending colon. Previously suggested small bowel obstruction has resolved. Gas in the distal colon is within normal limits. Small bowel pattern is normal. Gallstones are again noted. The lung bases are clear. Surgical clips are present at the GE junction. IMPRESSION: 1. Interval resolution of small bowel obstruction. 2. Moderate stool is present in the ascending colon without obstruction. 3. Cholelithiasis. 4. Surgical clips are present at the GE junction. Electronically Signed   By: San Morelle M.D.   On: 11/08/2015 07:44     PERTINENT LAB RESULTS: CBC:  Recent Labs  11/07/15 0502 11/08/15 0449  WBC 5.6 4.9  HGB 12.8* 11.2*  HCT 38.2* 34.3*  PLT 51* 46*   CMET CMP     Component Value Date/Time   NA 141 11/08/2015 0449   NA 139 03/12/2015 1214   K 3.6 11/08/2015 0449   K 3.8 03/12/2015 1214   CL 110 11/08/2015 0449   CL 108 03/12/2015 1214   CO2 23 11/08/2015 0449   CO2 23 03/12/2015 1214   GLUCOSE 77 11/08/2015 0449   GLUCOSE 124* 03/12/2015 1214   BUN 12 11/08/2015 0449   BUN 11 03/12/2015 1214   CREATININE 1.17 11/08/2015 0449   CREATININE 0.92  03/12/2015 1214   CREATININE 0.84 07/20/2011 0000   CALCIUM 8.2* 11/08/2015 0449   CALCIUM 8.6* 03/12/2015 1214   PROT 7.2 11/06/2015 2335   PROT 7.1 03/12/2015 1214   ALBUMIN 3.3* 11/06/2015 2335   ALBUMIN 3.3* 03/12/2015 1214   AST 60* 11/06/2015 2335   AST 84* 03/12/2015 1214   ALT 23 11/06/2015 2335   ALT 37 03/12/2015 1214   ALKPHOS 66 11/06/2015 2335   ALKPHOS 81 03/12/2015 1214   BILITOT 4.7* 11/06/2015 2335   BILITOT 3.4* 03/12/2015 1214   GFRNONAA >60 11/08/2015 0449   GFRNONAA >60 03/12/2015 1214   GFRAA >60 11/08/2015 0449   GFRAA >60 03/12/2015 1214    GFR Estimated Creatinine Clearance: 81 mL/min (by C-G formula based on Cr of 1.17).  Recent Labs  11/06/15 2335  LIPASE 37   No results for input(s): CKTOTAL, CKMB, CKMBINDEX, TROPONINI in the last 72 hours. Invalid input(s): POCBNP No results for input(s): DDIMER in the last 72 hours. No results for input(s): HGBA1C in the last 72 hours. No results for input(s): CHOL, HDL, LDLCALC, TRIG, CHOLHDL, LDLDIRECT in the last 72 hours. No results for input(s): TSH, T4TOTAL, T3FREE, THYROIDAB in the  last 72 hours.  Invalid input(s): FREET3 No results for input(s): VITAMINB12, FOLATE, FERRITIN, TIBC, IRON, RETICCTPCT in the last 72 hours. Coags: No results for input(s): INR in the last 72 hours.  Invalid input(s): PT Microbiology: Recent Results (from the past 240 hour(s))  MRSA PCR Screening     Status: None   Collection Time: 11/07/15  6:05 AM  Result Value Ref Range Status   MRSA by PCR NEGATIVE NEGATIVE Final    Comment:        The GeneXpert MRSA Assay (FDA approved for NASAL specimens only), is one component of a comprehensive MRSA colonization surveillance program. It is not intended to diagnose MRSA infection nor to guide or monitor treatment for MRSA infections.      BRIEF HOSPITAL COURSE:  SBO (small bowel obstruction):Resolved, admitted-kept NPO and provided supportive measures. Gen surgery  consulted. With supportive measures, patient much improved-had BM earlier this am and diet now advanced to regular diet that he has tolerated. Stable for discharge.   Active Problems: Essential tremors: Denies any recent alcohol use (last use in June 2016)-on propanolol-resume.  Cirrhosis/hepatitis C: Appears stable/compensated. Continue Ribavirin and Sofosbuvir  Thrombocytopenia: Secondary to cirrhosis. At baseline.  GERD: Continue PPI  History of alcohol abuse: Claims to be abstinent since June 2016.   TODAY-DAY OF DISCHARGE:  Subjective:   Noah Medina today has no headache,no chest abdominal pain,no new weakness tingling or numbness, feels much better wants to go home today.   Objective:   Blood pressure 119/54, pulse 64, temperature 99.2 F (37.3 C), temperature source Oral, resp. rate 18, height 5\' 11"  (1.803 m), weight 100.4 kg (221 lb 5.5 oz), SpO2 98 %.  Intake/Output Summary (Last 24 hours) at 11/09/15 1038 Last data filed at 11/09/15 0900  Gross per 24 hour  Intake   2910 ml  Output   2550 ml  Net    360 ml   Filed Weights   11/06/15 2252 11/07/15 0453  Weight: 102.059 kg (225 lb) 100.4 kg (221 lb 5.5 oz)    Exam Awake Alert, Oriented *3, No new F.N deficits, Normal affect [place].AT,PERRAL Supple Neck,No JVD, No cervical lymphadenopathy appriciated.  Symmetrical Chest wall movement, Good air movement bilaterally, CTAB RRR,No Gallops,Rubs or new Murmurs, No Parasternal Heave +ve B.Sounds, Abd Soft, Non tender, No organomegaly appriciated, No rebound -guarding or rigidity. No Cyanosis, Clubbing or edema, No new Rash or bruise  DISCHARGE CONDITION: Stable  DISPOSITION: Home  DISCHARGE INSTRUCTIONS:    Activity:  As tolerated   Get Medicines reviewed and adjusted: Please take all your medications with you for your next visit with your Primary MD  Please request your Primary MD to go over all hospital tests and procedure/radiological results at the follow  up, please ask your Primary MD to get all Hospital records sent to his/her office.  If you experience worsening of your admission symptoms, develop shortness of breath, life threatening emergency, suicidal or homicidal thoughts you must seek medical attention immediately by calling 911 or calling your MD immediately  if symptoms less severe.  You must read complete instructions/literature along with all the possible adverse reactions/side effects for all the Medicines you take and that have been prescribed to you. Take any new Medicines after you have completely understood and accpet all the possible adverse reactions/side effects.   Do not drive when taking Pain medications.   Do not take more than prescribed Pain, Sleep and Anxiety Medications  Special Instructions: If you have smoked or chewed  Tobacco  in the last 2 yrs please stop smoking, stop any regular Alcohol  and or any Recreational drug use.  Wear Seat belts while driving.  Please note  You were cared for by a hospitalist during your hospital stay. Once you are discharged, your primary care physician will handle any further medical issues. Please note that NO REFILLS for any discharge medications will be authorized once you are discharged, as it is imperative that you return to your primary care physician (or establish a relationship with a primary care physician if you do not have one) for your aftercare needs so that they can reassess your need for medications and monitor your lab values.   Diet recommendation: Heart Healthy diet  Discharge Instructions    Call MD for:  persistant nausea and vomiting    Complete by:  As directed      Call MD for:  severe uncontrolled pain    Complete by:  As directed      Diet - low sodium heart healthy    Complete by:  As directed      Increase activity slowly    Complete by:  As directed            Follow-up Information    Follow up with Berkley Harvey, NP. Schedule an appointment  as soon as possible for a visit in 1 week.   Specialty:  Nurse Practitioner   Contact information:   [id number] [place] [address] [phone]       Total Time spent on discharge equals 25 minutes.  SignedOren Binet 11/09/2015 10:38 AM

## 2015-11-09 NOTE — Progress Notes (Signed)
  Subjective: No complaints. Tolerated liquids well  Objective: Vital signs in last 24 hours: Temp:  [98.4 F (36.9 C)-99.4 F (37.4 C)] 99.2 F (37.3 C) (11/26 0532) Pulse Rate:  [63-73] 64 (11/26 0532) Resp:  [17-18] 18 (11/26 0532) BP: (119-134)/(54-72) 119/54 mmHg (11/26 0532) SpO2:  [97 %-99 %] 98 % (11/26 0532) Last BM Date: 11/07/15  Intake/Output from previous day: 11-10-2023 0701 - 11/26 0700 In: 2670 [P.O.:900; I.V.:1770] Out: 2550 [Urine:2550] Intake/Output this shift:    Resp: clear to auscultation bilaterally Cardio: regular rate and rhythm GI: soft, nontender. ventral hernia reducible  Lab Results:   Recent Labs  11/07/15 0502 11/10/15 0449  WBC 5.6 4.9  HGB 12.8* 11.2*  HCT 38.2* 34.3*  PLT 51* 46*   BMET  Recent Labs  11/07/15 0502 November 10, 2015 0449  NA 141 141  K 4.1 3.6  CL 106 110  CO2 28 23  GLUCOSE 138* 77  BUN 10 12  CREATININE 1.26* 1.17  CALCIUM 9.4 8.2*   PT/INR No results for input(s): LABPROT, INR in the last 72 hours. ABG No results for input(s): PHART, HCO3 in the last 72 hours.  Invalid input(s): PCO2, PO2  Studies/Results: Dg Abd 2 Views  11/10/15  CLINICAL DATA:  Small bowel obstruction.  Abdominal pain. EXAM: ABDOMEN - 2 VIEW COMPARISON:  CT of the abdomen and pelvis 11/07/2015. FINDINGS: Moderate stool is present in the ascending colon. Previously suggested small bowel obstruction has resolved. Gas in the distal colon is within normal limits. Small bowel pattern is normal. Gallstones are again noted. The lung bases are clear. Surgical clips are present at the GE junction. IMPRESSION: 1. Interval resolution of small bowel obstruction. 2. Moderate stool is present in the ascending colon without obstruction. 3. Cholelithiasis. 4. Surgical clips are present at the GE junction. Electronically Signed   By: San Morelle M.D.   On: 10-Nov-2015 07:44    Anti-infectives: Anti-infectives    Start     Dose/Rate Route Frequency  Ordered Stop   11/07/15 2200  ribavirin (REBETOL) capsule 600 mg     600 mg Oral 2 times daily 11/07/15 1451     11/07/15 1400  ribavirin (REBETOL) capsule 200 mg  Status:  Discontinued     200 mg Oral 2 times daily 11/07/15 1228 11/07/15 1451   11/07/15 1400  Sofosbuvir TABS 400 mg     400 mg Oral Daily 11/07/15 1228        Assessment/Plan: s/p * No surgery found * Advance diet. If he tolerates this then he could go home from surgical standpoint later today  LOS: 2 days    TOTH III,PAUL S 11/09/2015

## 2015-11-25 ENCOUNTER — Encounter (HOSPITAL_BASED_OUTPATIENT_CLINIC_OR_DEPARTMENT_OTHER): Payer: Self-pay | Admitting: Emergency Medicine

## 2015-11-25 ENCOUNTER — Emergency Department (HOSPITAL_BASED_OUTPATIENT_CLINIC_OR_DEPARTMENT_OTHER)
Admission: EM | Admit: 2015-11-25 | Discharge: 2015-11-26 | Disposition: A | Discharge status: Home / Self Care | Payer: Medicaid Other | Attending: Emergency Medicine | Admitting: Emergency Medicine

## 2015-11-25 DIAGNOSIS — F17210 Nicotine dependence, cigarettes, uncomplicated: Secondary | ICD-10-CM | POA: Diagnosis not present

## 2015-11-25 DIAGNOSIS — K219 Gastro-esophageal reflux disease without esophagitis: Secondary | ICD-10-CM | POA: Insufficient documentation

## 2015-11-25 DIAGNOSIS — Z79899 Other long term (current) drug therapy: Secondary | ICD-10-CM | POA: Diagnosis not present

## 2015-11-25 DIAGNOSIS — K439 Ventral hernia without obstruction or gangrene: Secondary | ICD-10-CM | POA: Diagnosis not present

## 2015-11-25 DIAGNOSIS — K432 Incisional hernia without obstruction or gangrene: Secondary | ICD-10-CM

## 2015-11-25 DIAGNOSIS — Z8679 Personal history of other diseases of the circulatory system: Secondary | ICD-10-CM | POA: Insufficient documentation

## 2015-11-25 DIAGNOSIS — R109 Unspecified abdominal pain: Secondary | ICD-10-CM | POA: Diagnosis present

## 2015-11-25 DIAGNOSIS — Z9889 Other specified postprocedural states: Secondary | ICD-10-CM | POA: Diagnosis not present

## 2015-11-25 DIAGNOSIS — Z8619 Personal history of other infectious and parasitic diseases: Secondary | ICD-10-CM | POA: Insufficient documentation

## 2015-11-25 DIAGNOSIS — Z590 Homelessness: Secondary | ICD-10-CM | POA: Insufficient documentation

## 2015-11-25 DIAGNOSIS — Z8711 Personal history of peptic ulcer disease: Secondary | ICD-10-CM | POA: Diagnosis not present

## 2015-11-25 DIAGNOSIS — Z9049 Acquired absence of other specified parts of digestive tract: Secondary | ICD-10-CM | POA: Insufficient documentation

## 2015-11-25 MED ORDER — FENTANYL CITRATE (PF) 100 MCG/2ML IJ SOLN
100.0000 ug | Freq: Once | INTRAMUSCULAR | Status: AC
  Administered 2015-11-26: 100 ug via INTRAVENOUS
  Filled 2015-11-25: qty 2

## 2015-11-25 NOTE — ED Notes (Signed)
Per EMS pt c/o of hernia pain, hx of same; states home meds not working, vomiting x1

## 2015-11-25 NOTE — ED Notes (Signed)
Patient reports that he started to throw up and have abdominal pain "real bad"

## 2015-11-25 NOTE — ED Notes (Signed)
Patient upset that he has to go to the waiting room. States that he wanted to go to [facility] because they know him there and he would not have had to wait. The patient also reports that he was looking for his jacket, and when told that he did not have a jacket when he came in he gave this RN a glare. The patient glared off and on while looking for jacket. Then patient consceeded that he did not bring a jacket. Patient has slurred speech but steady gait. Patient acknowledged he drank 6 beers tonight.

## 2015-11-26 ENCOUNTER — Emergency Department (HOSPITAL_BASED_OUTPATIENT_CLINIC_OR_DEPARTMENT_OTHER): Payer: Medicaid Other

## 2015-11-26 ENCOUNTER — Other Ambulatory Visit (HOSPITAL_BASED_OUTPATIENT_CLINIC_OR_DEPARTMENT_OTHER): Payer: Self-pay

## 2015-11-26 LAB — CBC WITH DIFFERENTIAL/PLATELET
Basophils Absolute: 0 10*3/uL (ref 0.0–0.1)
Basophils Relative: 1 %
Eosinophils Absolute: 0.2 10*3/uL (ref 0.0–0.7)
Eosinophils Relative: 5 %
HCT: 33.3 % — ABNORMAL LOW (ref 39.0–52.0)
Hemoglobin: 11 g/dL — ABNORMAL LOW (ref 13.0–17.0)
Lymphocytes Relative: 35 %
Lymphs Abs: 1.5 10*3/uL (ref 0.7–4.0)
MCH: 34.3 pg — ABNORMAL HIGH (ref 26.0–34.0)
MCHC: 33 g/dL (ref 30.0–36.0)
MCV: 103.7 fL — ABNORMAL HIGH (ref 78.0–100.0)
Monocytes Absolute: 0.4 10*3/uL (ref 0.1–1.0)
Monocytes Relative: 9 %
Neutro Abs: 2.3 10*3/uL (ref 1.7–7.7)
Neutrophils Relative %: 50 %
Platelets: 45 10*3/uL — ABNORMAL LOW (ref 150–400)
RBC: 3.21 MIL/uL — ABNORMAL LOW (ref 4.22–5.81)
RDW: 15.5 % (ref 11.5–15.5)
Smear Review: DECREASED
WBC: 4.4 10*3/uL (ref 4.0–10.5)

## 2015-11-26 LAB — BASIC METABOLIC PANEL
Anion gap: 6 (ref 5–15)
BUN: 8 mg/dL (ref 6–20)
CO2: 23 mmol/L (ref 22–32)
Calcium: 8.1 mg/dL — ABNORMAL LOW (ref 8.9–10.3)
Chloride: 111 mmol/L (ref 101–111)
Creatinine, Ser: 0.98 mg/dL (ref 0.61–1.24)
GFR calc Af Amer: >60 mL/min (ref >60)
GFR calc non Af Amer: >60 mL/min (ref >60)
Glucose, Bld: 110 mg/dL — ABNORMAL HIGH (ref 65–99)
Potassium: 3.7 mmol/L (ref 3.5–5.1)
Sodium: 140 mmol/L (ref 135–145)

## 2015-11-26 MED ORDER — DIPHENHYDRAMINE HCL 50 MG/ML IJ SOLN
25.0000 mg | Freq: Once | INTRAMUSCULAR | Status: AC
Start: 2015-11-26 — End: 2015-11-26
  Administered 2015-11-26: 25 mg via INTRAVENOUS

## 2015-11-26 MED ORDER — PROMETHAZINE HCL 25 MG/ML IJ SOLN
INTRAMUSCULAR | Status: AC
  Administered 2015-11-26: 12.5 mg
  Filled 2015-11-26: qty 1

## 2015-11-26 MED ORDER — IOHEXOL 300 MG/ML  SOLN
100.0000 mL | Freq: Once | INTRAMUSCULAR | Status: AC | PRN
  Administered 2015-11-26: 100 mL via INTRAVENOUS

## 2015-11-26 MED ORDER — ONDANSETRON HCL 4 MG/2ML IJ SOLN: 4.0000 mg | Freq: Once | INTRAMUSCULAR | Status: DC

## 2015-11-26 MED ORDER — DIPHENHYDRAMINE HCL 50 MG/ML IJ SOLN
INTRAMUSCULAR | Status: AC
  Filled 2015-11-26: qty 1

## 2015-11-26 MED ORDER — OXYCODONE HCL 5 MG PO TABS: 5.0000 mg | ORAL_TABLET | ORAL | Status: AC | PRN

## 2015-11-26 MED ORDER — IOHEXOL 300 MG/ML  SOLN
50.0000 mL | Freq: Once | INTRAMUSCULAR | Status: AC | PRN
  Administered 2015-11-26: 50 mL via ORAL

## 2015-11-26 MED ORDER — PROMETHAZINE HCL 25 MG PO TABS: 25.0000 mg | ORAL_TABLET | Freq: Three times a day (TID) | ORAL | Status: AC | PRN

## 2015-11-26 NOTE — Discharge Instructions (Signed)
Return here as needed.  Follow-up with her general surgeon.  Slowly increase her fluid intake

## 2015-11-26 NOTE — ED Provider Notes (Signed)
CSN: [id number]     Arrival date & time 11/25/15  2207 History   First MD Initiated Contact with Patient 11/25/15 2321     Chief Complaint  Patient presents with  . Abdominal Pain     (Consider location/radiation/quality/duration/timing/severity/associated sxs/prior Treatment) HPI Patient presents to the emergency department with abdominal pain that has been ongoing for a while, but got worse this evening.  Patient states he has a ventral hernia that is painful.  The patient states he is not have any chest pain, shortness breath, numbness, dizziness, headache, blurred vision, dysuria, incontinence, bloody stool, hematemesis, back pain, fever, diarrhea, or syncope.  The patient states he does have some nausea with vomiting earlier tonight.  Patient's been drinking alcohol as well.  Patient states palpation makes the pain worse Past Medical History  Diagnosis Date  . Hepatic cirrhosis ([place]) 2012  . Hernia of abdominal cavity 2013  . Cirrhosis of liver ([place])   . Hepatitis C 03-28-12    ? 80's-prev. IV drug abuse  . 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 Blood transfusion 03-28-12    '81-hx. of bleeding ulcer  . History of bleeding ulcers 03-28-12    Hx. '81  . GERD (gastroesophageal reflux disease) 03-26-12    Hx. reflux. ulcers in past, occ. OTC med  . Incisional hernia     has drainage system from abdomin  . Subdural hematoma ([place]) 12/13/12  . Alcohol abuse   . Homelessness    Past Surgical History  Procedure Laterality Date  . Umbilical hernia repair    . Hernia repair  08/24/11    ventral hernia with mesh  . Hernia repair  [id number]    Umbilical  . Peptic ulcer  03-26-12    open peptic ulcer surgery repair- '81  . Incisional hernia repair  03/26/2012  . Incisional hernia repair  03/30/2012    Procedure: LAPAROSCOPIC INCISIONAL HERNIA;  Surgeon: Harl Bowie, MD;  Location: [facility];  Service: General;  Laterality: N/A;  Laparoscopic Incisional/ventral  Hernia Repair with Mesh  . Tonsillectomy    . Appendectomy    . Incisional hernia repair  07/20/2012    mesh  . Incisional hernia repair  07/20/2012    Procedure: HERNIA REPAIR INCISIONAL;  Surgeon: Harl Bowie, MD;  Location: [place];  Service: General;  Laterality: N/A;  repair of incisional hernia with Biologic mesh   Family History  Problem Relation Age of Onset  . Cancer Mother     breast  . Cancer Father     brain  . Heart disease Brother   . Cancer Sister     breast  . Cancer Sister     breast   Social History  Substance Use Topics  . Smoking status: Current Every Day Smoker -- 1.00 packs/day for 38 years    Types: Cigarettes  . Smokeless tobacco: Never Used  . Alcohol Use: No     Comment: "not anymore. I am a recovering alcoholic."    Review of Systems  All other systems negative except as documented in the HPI. All pertinent positives and negatives as reviewed in the HPI.   Allergies  Atenolol and Trazodone and nefazodone  Home Medications   Prior to Admission medications   Medication Sig Start Date End Date Taking? Authorizing Provider  Multiple Vitamins-Minerals (CENTRUM SILVER ADULT 50+ PO) Take 1 tablet by mouth daily.    Historical Provider, MD  omeprazole (PRILOSEC) 20 MG capsule Take 1 capsule (  20 mg total) by mouth 2 (two) times daily. Patient taking differently: Take 20 mg by mouth daily.  03/23/15   Tanna Furry, MD  propranolol (INDERAL) 20 MG tablet Take 20 mg by mouth 2 (two) times daily.    Historical Provider, MD  ribavirin (REBETOL) 200 MG capsule Take 600 mg by mouth 2 (two) times daily.     Historical Provider, MD  Sofosbuvir 400 MG TABS Take 400 mg by mouth daily.    Historical Provider, MD   BP 120/71 mmHg  Pulse 85  Temp(Src) 98.2 F (36.8 C) (Oral)  Resp 16  Ht 5\' 11"  (1.803 m)  Wt 100.245 kg  BMI 30.84 kg/m2  SpO2 99% Physical Exam  Constitutional: He is oriented to person, place, and time. He appears well-developed and  well-nourished. No distress.  HENT:  Head: Normocephalic and atraumatic.  Mouth/Throat: Oropharynx is clear and moist.  Eyes: Pupils are equal, round, and reactive to light.  Neck: Normal range of motion. Neck supple.  Cardiovascular: Normal rate, regular rhythm and normal heart sounds.  Exam reveals no gallop and no friction rub.   No murmur heard. Pulmonary/Chest: Effort normal and breath sounds normal. No respiratory distress. He has no wheezes.  Abdominal: Soft. Bowel sounds are normal. He exhibits no shifting dullness, no distension, no pulsatile liver, no fluid wave, no abdominal bruit, no ascites, no pulsatile midline mass and no mass. There is tenderness. There is no rebound and no guarding. A hernia is present. Hernia confirmed positive in the ventral area.    Neurological: He is alert and oriented to person, place, and time. He exhibits normal muscle tone. Coordination normal.  Skin: Skin is warm and dry. No rash noted. No erythema.  Psychiatric: He has a normal mood and affect. His behavior is normal.  Nursing note and vitals reviewed.   ED Course  Procedures (including critical care time) Labs Review Labs Reviewed  BASIC METABOLIC PANEL - Abnormal; Notable for the following:    Glucose, Bld 110 (*)    Calcium 8.1 (*)    All other components within normal limits  CBC WITH DIFFERENTIAL/PLATELET - Abnormal; Notable for the following:    RBC 3.21 (*)    Hemoglobin 11.0 (*)    HCT 33.3 (*)    MCV 103.7 (*)    MCH 34.3 (*)    Platelets 45 (*)    All other components within normal limits    Imaging Review Ct Abdomen Pelvis W Contrast  11/26/2015  CLINICAL DATA:  Acute onset of vomiting and severe generalized abdominal pain. Initial encounter. EXAM: CT ABDOMEN AND PELVIS WITH CONTRAST TECHNIQUE: Multidetector CT imaging of the abdomen and pelvis was performed using the standard protocol following bolus administration of intravenous contrast. CONTRAST:  136mL OMNIPAQUE  IOHEXOL 300 MG/ML  SOLN COMPARISON:  CT of the abdomen and pelvis performed 11/07/2015 FINDINGS: Minimal bibasilar atelectasis is noted. Small to moderate anterior abdominal wall hernias are noted, containing only fat. There is a diffusely nodular contour to the liver, compatible with hepatic cirrhosis. Mild peripheral calcification likely reflects a calcified granuloma. The spleen is enlarged, measuring 14.4 cm in length. Stones are noted dependently within the gallbladder. The gallbladder is otherwise unremarkable. The pancreas and adrenal glands are unremarkable. There is incomplete rotation of the right kidney. The kidneys are otherwise unremarkable. There is no evidence of hydronephrosis. No renal or ureteral stones are seen. No perinephric stranding is appreciated. No free fluid is identified. The small bowel is unremarkable in  appearance. The stomach is within normal limits. No acute vascular abnormalities are seen. There is marked prominence of the portal venous system and mesenteric veins, with large varices extending into the right lower quadrant and right hemipelvis. There is mild broad-based bulging at the anterior abdominal wall, without a focal bowel containing hernia. The patient is status post appendectomy. The colon is grossly unremarkable in appearance. The bladder is mildly distended and grossly unremarkable. The prostate remains normal in size. No inguinal lymphadenopathy is seen. No acute osseous abnormalities are identified. 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 IMPRESSION: 1. No acute abnormality seen to explain patient's symptoms. 2. Findings of hepatic cirrhosis. Marked prominence of the portal venous system and mesenteric veins, with large varices extending into the right lower quadrant and right hemipelvis. 3. Splenomegaly noted. 4. Cholelithiasis.  Gallbladder otherwise unremarkable. 5. Small to moderate anterior abdominal wall hernias, containing only fat. Additional mild  broad-based bulging of the anterior abdominal wall more inferiorly, without a focal bowel containing hernia. 6. 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 Electronically Signed   By: Garald Balding M.D.   On: 11/26/2015 01:17   I have personally reviewed and evaluated these images and lab results as part of my medical decision-making.  Patient is awaiting CT scan results.  Patient was otherwise stable.  Patient had a recent bout extraction and was admitted to the hospital  Patient CT scan does not show any signs of bowel obstruction.  Patient will be treated for his pain and advised follow-up with his primary care doctor along with his general surgeon.   Dalia Heading, PA-C [id number] [phone]  Delora Fuel, MD [phone]

## 2016-03-16 ENCOUNTER — Encounter (HOSPITAL_COMMUNITY): Payer: Self-pay | Admitting: Emergency Medicine

## 2016-03-16 ENCOUNTER — Emergency Department (HOSPITAL_COMMUNITY)
Admission: EM | Admit: 2016-03-16 | Discharge: 2016-03-16 | Discharge status: Against Medical Advice | Payer: Medicaid Other | Attending: Emergency Medicine | Admitting: Emergency Medicine

## 2016-03-16 DIAGNOSIS — Z8619 Personal history of other infectious and parasitic diseases: Secondary | ICD-10-CM | POA: Diagnosis not present

## 2016-03-16 DIAGNOSIS — Y9289 Other specified places as the place of occurrence of the external cause: Secondary | ICD-10-CM | POA: Insufficient documentation

## 2016-03-16 DIAGNOSIS — K219 Gastro-esophageal reflux disease without esophagitis: Secondary | ICD-10-CM | POA: Insufficient documentation

## 2016-03-16 DIAGNOSIS — Z79899 Other long term (current) drug therapy: Secondary | ICD-10-CM | POA: Insufficient documentation

## 2016-03-16 DIAGNOSIS — Z590 Homelessness: Secondary | ICD-10-CM | POA: Insufficient documentation

## 2016-03-16 DIAGNOSIS — S0990XA Unspecified injury of head, initial encounter: Secondary | ICD-10-CM | POA: Diagnosis not present

## 2016-03-16 DIAGNOSIS — R55 Syncope and collapse: Secondary | ICD-10-CM | POA: Diagnosis present

## 2016-03-16 DIAGNOSIS — R251 Tremor, unspecified: Secondary | ICD-10-CM | POA: Diagnosis not present

## 2016-03-16 DIAGNOSIS — W108XXA Fall (on) (from) other stairs and steps, initial encounter: Secondary | ICD-10-CM | POA: Insufficient documentation

## 2016-03-16 DIAGNOSIS — Z8711 Personal history of peptic ulcer disease: Secondary | ICD-10-CM | POA: Insufficient documentation

## 2016-03-16 DIAGNOSIS — Y998 Other external cause status: Secondary | ICD-10-CM | POA: Diagnosis not present

## 2016-03-16 DIAGNOSIS — F10120 Alcohol abuse with intoxication, uncomplicated: Secondary | ICD-10-CM | POA: Insufficient documentation

## 2016-03-16 DIAGNOSIS — Y9301 Activity, walking, marching and hiking: Secondary | ICD-10-CM | POA: Diagnosis not present

## 2016-03-16 DIAGNOSIS — F17210 Nicotine dependence, cigarettes, uncomplicated: Secondary | ICD-10-CM | POA: Insufficient documentation

## 2016-03-16 NOTE — ED Notes (Signed)
Pt very agitated, pulling off monitors and IV, wants to leave AMA, putting on clothes now.

## 2016-03-16 NOTE — ED Provider Notes (Addendum)
CSN: [id number]     Arrival date & time 03/16/16  1129 History   First MD Initiated Contact with Patient 03/16/16 1136     Chief Complaint  Patient presents with  . Near Syncope     (Consider location/radiation/quality/duration/timing/severity/associated sxs/prior Treatment) HPI Noah Medina is a 60 y.o. male with hx of hepatic cirrhosis, hepatitis C, prior subdural hematoma, alcohol abuse, presents to emergency department with complaint of "I need help with my brain." Patient reports multiple "blackout episodes." Over the last several days. Patient states he has had these in the past but states he hasn't had any in "years and years." Patient reports yesterday he blacked out while walking down the stairs and fell approximately 10 stairs. He reports hitting his head. He reports another episode today, which made her more concerned and he went to urgent care. Patient states that "I only went to the urgent care because my ex-wife works there." Patient states that his ex-wife called EMS and he was brought here. Patient denies any complaints at this time. He smells strongly of alcohol and admits to drinking 3 beers this morning. He states however "I do not have a drinking problem and can go several days without drinking." At this time patient denies headache. No new numbness or weakness in extremities. No blurred vision. He reports tremors in his arms and legs but states those are chronic. No CP or SOB. No other complaints.  Past Medical History  Diagnosis Date  . Hepatic cirrhosis ([place]) 2012  . Hernia of abdominal cavity 2013  . Cirrhosis of liver ([place])   . Hepatitis C 03-28-12    ? 80's-prev. IV drug abuse  . 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 Blood transfusion 03-28-12    '81-hx. of bleeding ulcer  . History of bleeding ulcers 03-28-12    Hx. '81  . GERD (gastroesophageal reflux disease) 03-26-12    Hx. reflux. ulcers in past, occ. OTC med  . Incisional hernia      has drainage system from abdomin  . Subdural hematoma ([place]) 12/13/12  . Alcohol abuse   . Homelessness    Past Surgical History  Procedure Laterality Date  . Umbilical hernia repair    . Hernia repair  08/24/11    ventral hernia with mesh  . Hernia repair  [id number]    Umbilical  . Peptic ulcer  03-26-12    open peptic ulcer surgery repair- '81  . Incisional hernia repair  03/26/2012  . Incisional hernia repair  03/30/2012    Procedure: LAPAROSCOPIC INCISIONAL HERNIA;  Surgeon: Harl Bowie, MD;  Location: [facility];  Service: General;  Laterality: N/A;  Laparoscopic Incisional/ventral Hernia Repair with Mesh  . Tonsillectomy    . Appendectomy    . Incisional hernia repair  07/20/2012    mesh  . Incisional hernia repair  07/20/2012    Procedure: HERNIA REPAIR INCISIONAL;  Surgeon: Harl Bowie, MD;  Location: [facility];  Service: General;  Laterality: N/A;  repair of incisional hernia with Biologic mesh   Family History  Problem Relation Age of Onset  . Cancer Mother     breast  . Cancer Father     brain  . Heart disease Brother   . Cancer Sister     breast  . Cancer Sister     breast   Social History  Substance Use Topics  . Smoking status: Current Every Day Smoker -- 1.00 packs/day for 38 years  Types: Cigarettes  . Smokeless tobacco: Never Used  . Alcohol Use: No     Comment: "not anymore. I am a recovering alcoholic."    Review of Systems  Constitutional: Negative for fever and chills.  Respiratory: Negative for cough, chest tightness and shortness of breath.   Cardiovascular: Negative for chest pain, palpitations and leg swelling.  Gastrointestinal: Negative for nausea, vomiting, abdominal pain, diarrhea and abdominal distention.  Genitourinary: Negative for dysuria, urgency, frequency and hematuria.  Musculoskeletal: Negative for myalgias, arthralgias, neck pain and neck stiffness.  Skin: Negative for rash.  Neurological: Positive for syncope and headaches.  Negative for dizziness, weakness, light-headedness and numbness.  All other systems reviewed and are negative.     Allergies  Atenolol and Trazodone and nefazodone  Home Medications   Prior to Admission medications   Medication Sig Start Date End Date Taking? Authorizing Provider  Multiple Vitamins-Minerals (CENTRUM SILVER ADULT 50+ PO) Take 1 tablet by mouth daily.    Historical Provider, MD  omeprazole (PRILOSEC) 20 MG capsule Take 1 capsule (20 mg total) by mouth 2 (two) times daily. Patient taking differently: Take 20 mg by mouth daily.  03/23/15   Tanna Furry, MD  oxyCODONE (OXY IR/ROXICODONE) 5 MG immediate release tablet Take 1 tablet (5 mg total) by mouth every 4 (four) hours as needed for severe pain. 11/26/15   Dalia Heading, PA-C  promethazine (PHENERGAN) 25 MG tablet Take 1 tablet (25 mg total) by mouth every 8 (eight) hours as needed for nausea or vomiting. 11/26/15   Dalia Heading, PA-C  propranolol (INDERAL) 20 MG tablet Take 20 mg by mouth 2 (two) times daily.    Historical Provider, MD  ribavirin (REBETOL) 200 MG capsule Take 600 mg by mouth 2 (two) times daily.     Historical Provider, MD  Sofosbuvir 400 MG TABS Take 400 mg by mouth daily.    Historical Provider, MD   There were no vitals taken for this visit. Physical Exam  Constitutional: He is oriented to person, place, and time. He appears well-developed and well-nourished. No distress.  HENT:  Head: Normocephalic and atraumatic.  Eyes: Conjunctivae and EOM are normal. Pupils are equal, round, and reactive to light.  Neck: Normal range of motion. Neck supple.  Cardiovascular: Normal rate, regular rhythm and normal heart sounds.   Pulmonary/Chest: Effort normal. No respiratory distress. He has no wheezes. He has no rales.  Abdominal: Soft. Bowel sounds are normal. He exhibits no distension. There is no tenderness. There is no rebound.  Musculoskeletal: He exhibits no edema.  Neurological: He is alert and  oriented to person, place, and time.  5/5 and equal upper and lower extremity strength bilaterally. Equal grip strength bilaterally. Normal finger to nose and heel to shin. No pronator drift. Gait is normal  Skin: Skin is warm and dry.  Psychiatric:  Appears mildly intoxicated  Nursing note and vitals reviewed.   ED Course  Procedures (including critical care time) Labs Review Labs Reviewed  CBC WITH DIFFERENTIAL/PLATELET  COMPREHENSIVE METABOLIC PANEL  URINALYSIS, ROUTINE W REFLEX MICROSCOPIC (NOT AT [facility])  ETHANOL  URINE RAPID DRUG SCREEN, HOSP PERFORMED  I-STAT [place], ED    Imaging Review No results found. I have personally reviewed and evaluated these images and lab results as part of my medical decision-making.   EKG Interpretation None      MDM   Final diagnoses:  Syncope, unspecified syncope type    Patient seen and examined. Patient in emergency department with what sounds  like syncopal episodes over the last several days. He also reports falling down the stairs yesterday after one of his episodes. He was in no acute distress on exam. No neurological deficits. He smells strongly of alcohol. Will get CT head and cervical spine. Will do syncope workup.  12:16 PM Patient reported he wanted to leave [place]. I discussed with them the risks that he takes by leaving without blood work or imaging. Patient was understanding. He is alert and oriented this time, he is ambulatory without difficulties with steady gait. I believe at this time he is capable of making decisions and understands all of the risks of leaving [place]. I asked him to return if he changes his mind.  There were no vitals filed for this visit.   Jeannett Senior, PA-C 03/16/16 [place], MD 03/16/16 [place], PA-C 03/26/16 [place], MD 03/26/16 [id number]

## 2016-03-16 NOTE — ED Notes (Signed)
Pt spoke with this RN and PA, and agrees to the risks and implications of leaving AMA. Pt left with all belongings.

## 2016-03-16 NOTE — ED Notes (Signed)
Pt brought via [facility] with c/o recent "blackouts" questionable seizure and passing out. Pt was brought from [facility].  Pt reports tremors and falling and hitting head Saturday and again on Monday, with +LOC on Saturday. Pt says he "drinks 3 beers/day", had 3 beers this morning.

## 2016-06-19 ENCOUNTER — Emergency Department (HOSPITAL_COMMUNITY)
Admission: EM | Admit: 2016-06-19 | Discharge: 2016-06-20 | Disposition: A | Discharge status: Home / Self Care | Payer: Medicaid Other | Attending: Emergency Medicine | Admitting: Emergency Medicine

## 2016-06-19 ENCOUNTER — Encounter (HOSPITAL_COMMUNITY): Payer: Self-pay | Admitting: Emergency Medicine

## 2016-06-19 ENCOUNTER — Emergency Department (HOSPITAL_COMMUNITY): Payer: Medicaid Other

## 2016-06-19 DIAGNOSIS — D6959 Other secondary thrombocytopenia: Secondary | ICD-10-CM | POA: Diagnosis not present

## 2016-06-19 DIAGNOSIS — R079 Chest pain, unspecified: Secondary | ICD-10-CM | POA: Diagnosis present

## 2016-06-19 DIAGNOSIS — R51 Headache: Secondary | ICD-10-CM | POA: Insufficient documentation

## 2016-06-19 DIAGNOSIS — Z79899 Other long term (current) drug therapy: Secondary | ICD-10-CM | POA: Insufficient documentation

## 2016-06-19 DIAGNOSIS — F17210 Nicotine dependence, cigarettes, uncomplicated: Secondary | ICD-10-CM | POA: Diagnosis not present

## 2016-06-19 DIAGNOSIS — F1020 Alcohol dependence, uncomplicated: Secondary | ICD-10-CM

## 2016-06-19 LAB — RAPID URINE DRUG SCREEN, HOSP PERFORMED
Amphetamines: NOT DETECTED
Barbiturates: NOT DETECTED
Benzodiazepines: NOT DETECTED
COCAINE: NOT DETECTED
OPIATES: NOT DETECTED
TETRAHYDROCANNABINOL: NOT DETECTED

## 2016-06-19 LAB — CBC WITH DIFFERENTIAL/PLATELET
BASOS PCT: 0 %
Basophils Absolute: 0 10*3/uL (ref 0.0–0.1)
Eosinophils Absolute: 0.2 10*3/uL (ref 0.0–0.7)
Eosinophils Relative: 6 %
HEMATOCRIT: 33.8 % — AB (ref 39.0–52.0)
HEMOGLOBIN: 11.8 g/dL — AB (ref 13.0–17.0)
LYMPHS PCT: 36 %
Lymphs Abs: 1.3 10*3/uL (ref 0.7–4.0)
MCH: 34.3 pg — ABNORMAL HIGH (ref 26.0–34.0)
MCHC: 34.9 g/dL (ref 30.0–36.0)
MCV: 98.3 fL (ref 78.0–100.0)
MONO ABS: 0.3 10*3/uL (ref 0.1–1.0)
MONOS PCT: 7 %
NEUTROS ABS: 1.9 10*3/uL (ref 1.7–7.7)
NEUTROS PCT: 51 %
Platelets: 28 10*3/uL — CL (ref 150–400)
RBC: 3.44 MIL/uL — AB (ref 4.22–5.81)
RDW: 14.3 % (ref 11.5–15.5)
WBC: 3.6 10*3/uL — ABNORMAL LOW (ref 4.0–10.5)

## 2016-06-19 LAB — COMPREHENSIVE METABOLIC PANEL
ALBUMIN: 2.7 g/dL — AB (ref 3.5–5.0)
ALK PHOS: 78 U/L (ref 38–126)
ALT: 24 U/L (ref 17–63)
ANION GAP: 5 (ref 5–15)
AST: 65 U/L — AB (ref 15–41)
BILIRUBIN TOTAL: 1.3 mg/dL — AB (ref 0.3–1.2)
BUN: 9 mg/dL (ref 6–20)
CALCIUM: 8.1 mg/dL — AB (ref 8.9–10.3)
CO2: 27 mmol/L (ref 22–32)
CREATININE: 1.05 mg/dL (ref 0.61–1.24)
Chloride: 112 mmol/L — ABNORMAL HIGH (ref 101–111)
GFR calc Af Amer: >60 mL/min (ref >60)
GFR calc non Af Amer: >60 mL/min (ref >60)
GLUCOSE: 101 mg/dL — AB (ref 65–99)
Potassium: 3.8 mmol/L (ref 3.5–5.1)
Sodium: 144 mmol/L (ref 135–145)
TOTAL PROTEIN: 5.6 g/dL — AB (ref 6.5–8.1)

## 2016-06-19 LAB — I-STAT CHEM 8, ED
BUN: 9 mg/dL (ref 6–20)
Calcium, Ion: 1.04 mmol/L — ABNORMAL LOW (ref 1.12–1.23)
Chloride: 109 mmol/L (ref 101–111)
Creatinine, Ser: 1.4 mg/dL — ABNORMAL HIGH (ref 0.61–1.24)
GLUCOSE: 96 mg/dL (ref 65–99)
HCT: 33 % — ABNORMAL LOW (ref 39.0–52.0)
Hemoglobin: 11.2 g/dL — ABNORMAL LOW (ref 13.0–17.0)
Potassium: 3.8 mmol/L (ref 3.5–5.1)
Sodium: 147 mmol/L — ABNORMAL HIGH (ref 135–145)
TCO2: 24 mmol/L (ref 0–100)

## 2016-06-19 LAB — I-STAT TROPONIN, ED: TROPONIN I, POC: 0 ng/mL (ref 0.00–0.08)

## 2016-06-19 LAB — LIPASE, BLOOD: Lipase: 32 U/L (ref 11–51)

## 2016-06-19 MED ORDER — SODIUM CHLORIDE 0.9 % IV BOLUS (SEPSIS)
1000.0000 mL | Freq: Once | INTRAVENOUS | Status: AC
  Administered 2016-06-19: 1000 mL via INTRAVENOUS

## 2016-06-19 MED ORDER — FAMOTIDINE IN NACL 20-0.9 MG/50ML-% IV SOLN
20.0000 mg | Freq: Two times a day (BID) | INTRAVENOUS | Status: DC
  Administered 2016-06-19: 20 mg via INTRAVENOUS
  Filled 2016-06-19: qty 50

## 2016-06-19 MED ORDER — LORAZEPAM 2 MG/ML IJ SOLN: 0.0000 mg | Freq: Two times a day (BID) | INTRAMUSCULAR | Status: DC

## 2016-06-19 MED ORDER — LORAZEPAM 2 MG/ML IJ SOLN
0.0000 mg | Freq: Four times a day (QID) | INTRAMUSCULAR | Status: DC
  Administered 2016-06-19: 1 mg via INTRAVENOUS
  Filled 2016-06-19: qty 1

## 2016-06-19 NOTE — ED Notes (Signed)
PA at bedside.

## 2016-06-19 NOTE — ED Notes (Addendum)
Pt arrived to ED via EMS. Pt states that he is an alcoholic. Started drinking beer at 7am this morning (12 pack). Today at 9pm he felt chest pains. Pain originally reported at 10 on scale of 0-10. States it feels like someone is punching him in the chest.  EMS provided patient with 2 Nitro, and 4 aspirin. EKG- normal sinus rhythm. Pt presents with tremors. Elevated pulse with tremors.

## 2016-06-19 NOTE — ED Notes (Signed)
Patient transported to X-ray 

## 2016-06-19 NOTE — ED Provider Notes (Signed)
CSN: [id number]     Arrival date & time 06/19/16  2210 History   First MD Initiated Contact with Patient 06/19/16 2222     Chief Complaint  Patient presents with  . Chest Pain     (Consider location/radiation/quality/duration/timing/severity/associated sxs/prior Treatment) HPI Comments: 60 year old male with a history of hepatic cirrhosis, hepatitis C, esophageal reflux and bleeding ulcers, subdural hematoma, alcohol abuse, and homelessness presents to the emergency department for evaluation of chest pain. Patient is a very poor historian. This is likely secondary to alcohol intoxication. Patient states that he started drinking at 7 AM today. He has had 12 beers. Patient was at the liquor store when the store clerk called EMS for the patient's c/o chest pain. Patient reporting central to left sided chest pain radiating to his back. He characterizes his pain as "someone punching me in the chest". He states that it has been associated with lightheadedness. He also reports some SOB and diaphoresis. Patient also reporting a frontal headache that is "pounding". Patient denies a personal hx of MI or PCI. No known hx of CAD. He does have a 38 pack year smoking hx. No reported syncope or fevers. 2 SL NTG and 4 tablets 81mg  ASA given by EMS PTA.  Patient is a 60 y.o. male presenting with chest pain. The history is provided by the patient. No language interpreter was used.  Chest Pain Associated symptoms: diaphoresis and shortness of breath   Associated symptoms: not vomiting     Past Medical History  Diagnosis Date  . Hepatic cirrhosis ([place]) 2012  . Hernia of abdominal cavity 2013  . Cirrhosis of liver ([place])   . Hepatitis C 03-28-12    ? 80's-prev. IV drug abuse  . 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 Blood transfusion 03-28-12    '81-hx. of bleeding ulcer  . History of bleeding ulcers 03-28-12    Hx. '81  . GERD (gastroesophageal reflux disease) 03-26-12    Hx. reflux.  ulcers in past, occ. OTC med  . Incisional hernia     has drainage system from abdomin  . Subdural hematoma ([place]) 12/13/12  . Alcohol abuse   . Homelessness    Past Surgical History  Procedure Laterality Date  . Umbilical hernia repair    . Hernia repair  08/24/11    ventral hernia with mesh  . Hernia repair  [id number]    Umbilical  . Peptic ulcer  03-26-12    open peptic ulcer surgery repair- '81  . Incisional hernia repair  03/26/2012  . Incisional hernia repair  03/30/2012    Procedure: LAPAROSCOPIC INCISIONAL HERNIA;  Surgeon: Harl Bowie, MD;  Location: [facility];  Service: General;  Laterality: N/A;  Laparoscopic Incisional/ventral Hernia Repair with Mesh  . Tonsillectomy    . Appendectomy    . Incisional hernia repair  07/20/2012    mesh  . Incisional hernia repair  07/20/2012    Procedure: HERNIA REPAIR INCISIONAL;  Surgeon: Harl Bowie, MD;  Location: [facility];  Service: General;  Laterality: N/A;  repair of incisional hernia with Biologic mesh   Family History  Problem Relation Age of Onset  . Cancer Mother     breast  . Cancer Father     brain  . Heart disease Brother   . Cancer Sister     breast  . Cancer Sister     breast   Social History  Substance Use Topics  . Smoking status: Current Every Day  Smoker -- 1.00 packs/day for 38 years    Types: Cigarettes  . Smokeless tobacco: Never Used  . Alcohol Use: No     Comment: "not anymore. I am a recovering alcoholic."    Review of Systems  Constitutional: Positive for diaphoresis.  Respiratory: Positive for shortness of breath.   Cardiovascular: Positive for chest pain.  Gastrointestinal: Negative for vomiting.  Neurological: Positive for light-headedness. Negative for syncope.  Ten systems reviewed and are negative for acute change, except as noted in the HPI.    Allergies  Atenolol and Trazodone and nefazodone  Home Medications   Prior to Admission medications   Medication Sig Start Date End Date  Taking? Authorizing Provider  Multiple Vitamins-Minerals (CENTRUM SILVER ADULT 50+ PO) Take 1 tablet by mouth daily.    Historical Provider, MD  omeprazole (PRILOSEC) 20 MG capsule Take 1 capsule (20 mg total) by mouth 2 (two) times daily. Patient taking differently: Take 20 mg by mouth daily.  03/23/15   Tanna Furry, MD  oxyCODONE (OXY IR/ROXICODONE) 5 MG immediate release tablet Take 1 tablet (5 mg total) by mouth every 4 (four) hours as needed for severe pain. 11/26/15   Dalia Heading, PA-C  promethazine (PHENERGAN) 25 MG tablet Take 1 tablet (25 mg total) by mouth every 8 (eight) hours as needed for nausea or vomiting. 11/26/15   Dalia Heading, PA-C  propranolol (INDERAL) 20 MG tablet Take 20 mg by mouth 2 (two) times daily.    Historical Provider, MD  ribavirin (REBETOL) 200 MG capsule Take 600 mg by mouth 2 (two) times daily.     Historical Provider, MD  Sofosbuvir 400 MG TABS Take 400 mg by mouth daily.    Historical Provider, MD   BP 114/62 mmHg  Pulse 78  Temp(Src) 98 F (36.7 C) (Oral)  Resp 17  SpO2 94%   Physical Exam  Constitutional: He is oriented to person, place, and time. He appears well-developed and well-nourished. No distress.  Nontoxic appearing  HENT:  Head: Normocephalic and atraumatic.  Eyes: Conjunctivae and EOM are normal. Pupils are equal, round, and reactive to light. No scleral icterus.  Neck: Normal range of motion.  Cardiovascular: Normal rate, regular rhythm and intact distal pulses.   Pulmonary/Chest: Effort normal and breath sounds normal. No respiratory distress. He has no wheezes. He has no rales.  Respirations even and unlabored  Abdominal: Soft.  Soft abdominal hernia. No masses or peritoneal signs.  Musculoskeletal: Normal range of motion.  Neurological: He is alert and oriented to person, place, and time. He displays tremor. He exhibits normal muscle tone. Coordination normal.  Speech is goal oriented. Patient answers questions  appropriately and follows commands. Resting tremor noted in BUE.  Skin: Skin is warm and dry. No rash noted. He is not diaphoretic. No erythema. No pallor.  Psychiatric: His mood appears anxious. He is agitated. Thought content is paranoid.  Nursing note and vitals reviewed.   ED Course  Procedures (including critical care time) Labs Review Labs Reviewed  ETHANOL - Abnormal; Notable for the following:    Alcohol, Ethyl (B) 301 (*)    All other components within normal limits  CBC WITH DIFFERENTIAL/PLATELET - Abnormal; Notable for the following:    WBC 3.6 (*)    RBC 3.44 (*)    Hemoglobin 11.8 (*)    HCT 33.8 (*)    MCH 34.3 (*)    Platelets 28 (*)    All other components within normal limits  COMPREHENSIVE METABOLIC PANEL -  Abnormal; Notable for the following:    Chloride 112 (*)    Glucose, Bld 101 (*)    Calcium 8.1 (*)    Total Protein 5.6 (*)    Albumin 2.7 (*)    AST 65 (*)    Total Bilirubin 1.3 (*)    All other components within normal limits  I-STAT CHEM 8, ED - Abnormal; Notable for the following:    Sodium 147 (*)    Creatinine, Ser 1.40 (*)    Calcium, Ion 1.04 (*)    Hemoglobin 11.2 (*)    HCT 33.0 (*)    All other components within normal limits  URINE RAPID DRUG SCREEN, HOSP PERFORMED  LIPASE, BLOOD  I-STAT TROPOININ, ED  I-STAT TROPOININ, ED   12:04 AM Ethanol 207, per lab via verbal read back with this writer  12:19 AM Per nurse, now lab is reporting that they do not have the correct blood work. Blood re-sent. Awaiting results.  Imaging Review Dg Chest 2 View  06/19/2016  CLINICAL DATA:  Chest pain, onset today. EXAM: CHEST  2 VIEW COMPARISON:  Radiograph 10/02/2015 FINDINGS: Mild hyperinflation, chronic. Heart size and mediastinal contours are normal for AP technique. No pulmonary edema, pleural effusion, focal airspace disease or pneumothorax. Surgical clips the gastroesophageal junction. Osseous structures are unchanged. IMPRESSION: No active  cardiopulmonary disease. Electronically Signed   By: Jeb Levering M.D.   On: 06/19/2016 23:58   Ct Head Wo Contrast  06/20/2016  CLINICAL DATA:  Headache today. EXAM: CT HEAD WITHOUT CONTRAST TECHNIQUE: Contiguous axial images were obtained from the base of the skull through the vertex without intravenous contrast. COMPARISON:  03/21/2015 FINDINGS: Brain: No intracranial hemorrhage, mass effect, or midline shift. No hydrocephalus. The basilar cisterns are patent. No evidence of territorial infarct. Stable cerebral atrophy and mild chronic small vessel ischemia. No intracranial fluid collection. Vascular: No hyperdense vessel or abnormal calcification. Skull:  Calvarium is intact. Sinuses/Orbits: Included orbits are unremarkable. Included paranasal sinuses and mastoid air cells are well aerated. Other: None. IMPRESSION: No acute intracranial abnormality. Stable mild atrophy and chronic small vessel ischemia. Electronically Signed   By: Jeb Levering M.D.   On: 06/20/2016 01:11     I have personally reviewed and evaluated these images and lab results as part of my medical decision-making.   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 Patient is a poor historian, likely secondary to ETOH intoxication. He has a known hx of alcohol abuse. Suspect pain to be secondary to alcoholic gastritis. Patient with a  reassuring cardiac work up today. He has had a negative troponin 2. EKG without STEMI. Chest x-ray shows no cardiopulmonary abnormalities. Heart score is 3-4 c/w low to moderate risk of ACS; points vary for suspicion. Also low suspicion for PE. Doubt aortic dissection.  Work up today is notable for thrombocytopenia. This is chronic for the  patient, though slightly worse from baseline. Thrombocytopenia suspected to be secondary to ETOH use. Patient did c/o headache on arrival. CT shows no evidence of spontaneous ICH. No focal deficits noted on exam. No indication for emergent transfusion of platelets. Anemia is at baseline.  Patient with stable vital signs over ED course. No fever. No tachycardia. He is resting comfortably on reassessment. I do not believe the patient warrants further emergent workup at this time. He has been advised to follow-up with a primary care doctor regarding his symptoms today. Return precautions discussed and provided. Patient discharged in satisfactory condition with no unaddressed concerns.   Filed Vitals:   06/20/16 0130 06/20/16 0200 06/20/16 0215 06/20/16 0230  BP: 119/62 126/67 121/63 121/63  Pulse: 79 80 79 76  Temp:      TempSrc:      Resp: 15 17 17 17   SpO2: 93% 94% 94% 93%     Antonietta Breach, PA-C 06/23/16 0601  Alfonzo Beers, MD 06/23/16 [phone]

## 2016-06-20 ENCOUNTER — Emergency Department (HOSPITAL_COMMUNITY): Payer: Medicaid Other

## 2016-06-20 LAB — I-STAT TROPONIN, ED: TROPONIN I, POC: 0.01 ng/mL (ref 0.00–0.08)

## 2016-06-20 LAB — ETHANOL: Alcohol, Ethyl (B): 301 mg/dL (ref <5)

## 2016-06-20 NOTE — ED Notes (Signed)
Patient transported to MRI 

## 2016-06-20 NOTE — ED Notes (Signed)
PA aware of critical lab( ethanol).

## 2016-06-20 NOTE — Discharge Instructions (Signed)
Follow up with your primary care doctor regarding your ED visit today. Follow up with a cardiologist (heart doctor) regarding your symptoms as well. Discontinue drinking alcohol or significantly decrease the amount of alcohol you drink as this may be contributing to your pain today. Return to the ED for new or worsening symptoms.  Nonspecific Chest Pain  Chest pain can be caused by many different conditions. There is always a chance that your pain could be related to something serious, such as a heart attack or a blood clot in your lungs. Chest pain can also be caused by conditions that are not life-threatening. If you have chest pain, it is very important to follow up with your health care provider. CAUSES  Chest pain can be caused by:  Heartburn.  Pneumonia or bronchitis.  Anxiety or stress.  Inflammation around your heart (pericarditis) or lung (pleuritis or pleurisy).  A blood clot in your lung.  A collapsed lung (pneumothorax). It can develop suddenly on its own (spontaneous pneumothorax) or from trauma to the chest.  Shingles infection (varicella-zoster virus).  Heart attack.  Damage to the bones, muscles, and cartilage that make up your chest wall. This can include:  Bruised bones due to injury.  Strained muscles or cartilage due to frequent or repeated coughing or overwork.  Fracture to one or more ribs.  Sore cartilage due to inflammation (costochondritis). RISK FACTORS  Risk factors for chest pain may include:  Activities that increase your risk for trauma or injury to your chest.  Respiratory infections or conditions that cause frequent coughing.  Medical conditions or overeating that can cause heartburn.  Heart disease or family history of heart disease.  Conditions or health behaviors that increase your risk of developing a blood clot.  Having had chicken pox (varicella zoster). SIGNS AND SYMPTOMS Chest pain can feel like:  Burning or tingling on the  surface of your chest or deep in your chest.  Crushing, pressure, aching, or squeezing pain.  Dull or sharp pain that is worse when you move, cough, or take a deep breath.  Pain that is also felt in your back, neck, shoulder, or arm, or pain that spreads to any of these areas. Your chest pain may come and go, or it may stay constant. DIAGNOSIS Lab tests or other studies may be needed to find the cause of your pain. Your health care provider may have you take a test called an ambulatory ECG (electrocardiogram). An ECG records your heartbeat patterns at the time the test is performed. You may also have other tests, such as:  Transthoracic echocardiogram (TTE). During echocardiography, sound waves are used to create a picture of all of the heart structures and to look at how blood flows through your heart.  Transesophageal echocardiogram (TEE).This is a more advanced imaging test that obtains images from inside your body. It allows your health care provider to see your heart in finer detail.  Cardiac monitoring. This allows your health care provider to monitor your heart rate and rhythm in real time.  Holter monitor. This is a portable device that records your heartbeat and can help to diagnose abnormal heartbeats. It allows your health care provider to track your heart activity for several days, if needed.  Stress tests. These can be done through exercise or by taking medicine that makes your heart beat more quickly.  Blood tests.  Imaging tests. TREATMENT  Your treatment depends on what is causing your chest pain. Treatment may include:  Medicines. These  may include:  Acid blockers for heartburn.  Anti-inflammatory medicine.  Pain medicine for inflammatory conditions.  Antibiotic medicine, if an infection is present.  Medicines to dissolve blood clots.  Medicines to treat coronary artery disease.  Supportive care for conditions that do not require medicines. This may  include:  Resting.  Applying heat or cold packs to injured areas.  Limiting activities until pain decreases. HOME CARE INSTRUCTIONS  If you were prescribed an antibiotic medicine, finish it all even if you start to feel better.  Avoid any activities that bring on chest pain.  Do not use any tobacco products, including cigarettes, chewing tobacco, or electronic cigarettes. If you need help quitting, ask your health care provider.  Do not drink alcohol.  Take medicines only as directed by your health care provider.  Keep all follow-up visits as directed by your health care provider. This is important. This includes any further testing if your chest pain does not go away.  If heartburn is the cause for your chest pain, you may be told to keep your head raised (elevated) while sleeping. This reduces the chance that acid will go from your stomach into your esophagus.  Make lifestyle changes as directed by your health care provider. These may include:  Getting regular exercise. Ask your health care provider to suggest some activities that are safe for you.  Eating a heart-healthy diet. A registered dietitian can help you to learn healthy eating options.  Maintaining a healthy weight.  Managing diabetes, if necessary.  Reducing stress. SEEK MEDICAL CARE IF:  Your chest pain does not go away after treatment.  You have a rash with blisters on your chest.  You have a fever. SEEK IMMEDIATE MEDICAL CARE IF:   Your chest pain is worse.  You have an increasing cough, or you cough up blood.  You have severe abdominal pain.  You have severe weakness.  You faint.  You have chills.  You have sudden, unexplained chest discomfort.  You have sudden, unexplained discomfort in your arms, back, neck, or jaw.  You have shortness of breath at any time.  You suddenly start to sweat, or your skin gets clammy.  You feel nauseous or you vomit.  You suddenly feel light-headed or  dizzy.  Your heart begins to beat quickly, or it feels like it is skipping beats. These symptoms may represent a serious problem that is an emergency. Do not wait to see if the symptoms will go away. Get medical help right away. Call your local emergency services (911 in the [place] Do not drive yourself to the hospital.   This information is not intended to replace advice given to you by your health care provider. Make sure you discuss any questions you have with your health care provider.   Document Released: 09/09/2005 Document Revised: 12/21/2014 Document Reviewed: 07/06/2014 Elsevier Interactive Patient Education 2016 [employer].  Thrombocytopenia Thrombocytopenia is a condition in which there is an abnormally small number of platelets in your blood. Platelets are also called thrombocytes. Platelets are needed for blood clotting. CAUSES Thrombocytopenia is caused by:   Decreased production of platelets. This can be caused by:  Aplastic anemia in which your bone marrow quits making blood cells.  Cancer in the bone marrow.  Use of certain medicines, including chemotherapy.  Infection in the bone marrow.  Heavy alcohol consumption.  Increased destruction of platelets. This can be caused by:  Certain immune diseases.  Use of certain drugs.  Certain blood clotting disorders.  Certain inherited disorders.  Certain bleeding disorders.  Pregnancy.  Having an enlarged spleen (hypersplenism). In hypersplenism, the spleen gathers up platelets from circulation. This means the platelets are not available to help with blood clotting. The spleen can enlarge due to cirrhosis or other conditions. SYMPTOMS  The symptoms of thrombocytopenia are side effects of poor blood clotting. Some of these are:  Abnormal bleeding.  Nosebleeds.  Heavy menstrual periods.  Blood in the urine or stools.  Purpura. This is a purplish discoloration in the skin produced by small bleeding vessels  near the surface of the skin.  Bruising.  A rash that may be petechial. This looks like pinpoint, purplish-red spots on the skin and mucous membranes. It is caused by bleeding from small blood vessels (capillaries). DIAGNOSIS  Your caregiver will make this diagnosis based on your exam and blood tests. Sometimes, a bone marrow study is done to look for the original cells (megakaryocytes) that make platelets. TREATMENT  Treatment depends on the cause of the condition.  Medicines may be given to help protect your platelets from being destroyed.  In some cases, a replacement (transfusion) of platelets may be required to stop or prevent bleeding.  Sometimes, the spleen must be surgically removed. HOME CARE INSTRUCTIONS   Check the skin and linings inside your mouth for bruising or bleeding as directed by your caregiver.  Check your sputum, urine, and stool for blood as directed by your caregiver.  Do not return to any activities that could cause bumps or bruises until your caregiver says it is okay.  Take extra care not to cut yourself when shaving or when using scissors, needles, knives, and other tools.  Take extra care not to burn yourself when ironing or cooking.  Ask your caregiver if it is okay for you to drink alcohol.  Only take over-the-counter or prescription medicines as directed by your caregiver.  Notify all your caregivers, including dentists and eye doctors, about your condition. SEEK IMMEDIATE MEDICAL CARE IF:   You develop active bleeding from anywhere in your body.  You develop unexplained bruising or bleeding.  You have blood in your sputum, urine, or stool. MAKE SURE YOU:  Understand these instructions.  Will watch your condition.  Will get help right away if you are not doing well or get worse.   This information is not intended to replace advice given to you by your health care provider. Make sure you discuss any questions you have with your health care  provider.   Document Released: 11/30/2005 Document Revised: 02/22/2012 Document Reviewed: 06/03/2015 Elsevier Interactive Patient Education [employer].

## 2016-06-20 NOTE — ED Notes (Signed)
PA aware of critical lab ( platelets)

## 2016-06-20 NOTE — ED Notes (Signed)
Patient transported to CT 

## 2017-07-20 ENCOUNTER — Other Ambulatory Visit: Payer: Self-pay | Admitting: Surgical Oncology

## 2017-07-20 DIAGNOSIS — C220 Liver cell carcinoma: Secondary | ICD-10-CM

## 2017-07-22 ENCOUNTER — Ambulatory Visit
Admission: RE | Admit: 2017-07-22 | Discharge: 2017-07-22 | Disposition: A | Discharge status: Home / Self Care | Payer: Medicaid Other | Source: Ambulatory Visit | Attending: Surgical Oncology | Admitting: Surgical Oncology

## 2017-07-22 ENCOUNTER — Other Ambulatory Visit (HOSPITAL_COMMUNITY): Payer: Self-pay | Admitting: Interventional Radiology

## 2017-07-22 ENCOUNTER — Ambulatory Visit
Admission: RE | Admit: 2017-07-22 | Discharge: 2017-07-22 | Disposition: A | Discharge status: Home / Self Care | Payer: Medicaid Other | Source: Ambulatory Visit | Attending: Interventional Radiology | Admitting: Interventional Radiology

## 2017-07-22 ENCOUNTER — Other Ambulatory Visit: Payer: Self-pay

## 2017-07-22 DIAGNOSIS — C220 Liver cell carcinoma: Secondary | ICD-10-CM

## 2017-07-22 HISTORY — PX: IR RADIOLOGIST EVAL & MGMT: IMG5224

## 2017-07-22 NOTE — Consult Note (Signed)
Chief Complaint: Hepatocellular Carcinoma  Referring Physician(s): Arredondo,Mark  History of Present Illness: Noah Medina is a 61 y.o. male with extensive past medical history significant for incisional hernia, prior subdural hematoma, alcohol and intravenous drug abuse, hepatitis C and cirrhosis who was found to have an indeterminate lesion within the dome of the right lobe of the liver on abdominal CT performed on 05/20/2017 for the work-up of abdominal pain. Subsequent abdominal MRI performed on 05/21/2017 confirmed the presence of this lesion as well as imaging appearance compatible with hepatocellular carcinoma. This finding is associated with a mildly elevated AFP of 20 (obtained on 05/21/2017).  Patient was initially seen in consultation by surgical oncologist, Dr. Adair Laundry, however given patient's multiple medical comorbidities, he was not deemed an operative or transplant candidate.  As such, the patient has been referred to the interventional radiology clinic for potential percutaneous treatment options. The patient is unaccompanied and serves as his own historian.  The patient is asymptomatic in regards to his hepatocellular carcinoma. Specifically, no attributable abdominal pain. No increased abdominal girth. No yellowing of the skin or eyes. No unintentional weight loss.  The patient states that he has refrained from drugs and alcohol for the past 2 months and is interested in pursuing all available treatment options.    Past Medical History:  Diagnosis Date  . Alcohol abuse   . Blood transfusion 03-28-12   '81-hx. of bleeding ulcer  . Cirrhosis of liver ([place])   . GERD (gastroesophageal reflux disease) 03-26-12   Hx. reflux. ulcers in past, occ. OTC med  . Hepatic cirrhosis ([place]) 2012  . Hepatitis C 03-28-12   ? 80's-prev. IV drug abuse  . Hernia of abdominal cavity 2013  . History of bleeding ulcers 03-28-12   Hx. '81  . Homelessness   . Incisional hernia    has  drainage system from abdomin  . 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 Subdural hematoma ([place]) 12/13/12    Past Surgical History:  Procedure Laterality Date  . APPENDECTOMY    . HERNIA REPAIR  08/24/11   ventral hernia with mesh  . HERNIA REPAIR  89'37   Umbilical  . INCISIONAL HERNIA REPAIR  03/26/2012  . INCISIONAL HERNIA REPAIR  03/30/2012   Procedure: LAPAROSCOPIC INCISIONAL HERNIA;  Surgeon: Harl Bowie, MD;  Location: [facility];  Service: General;  Laterality: N/A;  Laparoscopic Incisional/ventral Hernia Repair with Mesh  . INCISIONAL HERNIA REPAIR  07/20/2012   mesh  . INCISIONAL HERNIA REPAIR  07/20/2012   Procedure: HERNIA REPAIR INCISIONAL;  Surgeon: Harl Bowie, MD;  Location: [facility];  Service: General;  Laterality: N/A;  repair of incisional hernia with Biologic mesh  . Peptic ulcer  03-26-12   open peptic ulcer surgery repair- '81  . TONSILLECTOMY    . UMBILICAL HERNIA REPAIR      Allergies: Atenolol; Propranolol; and Trazodone and nefazodone  Medications: Prior to Admission medications   Medication Sig Start Date End Date Taking? Authorizing Provider  folic acid (FOLVITE) 1 MG tablet Take 1 mg by mouth daily.   Yes [provider]  Multiple Vitamins-Minerals (CENTRUM SILVER ADULT 50+ PO) Take 1 tablet by mouth daily.   Yes [provider]  omeprazole (PRILOSEC) 20 MG capsule Take 1 capsule (20 mg total) by mouth 2 (two) times daily. Patient not taking: Reported on 07/22/2017 03/23/15   Tanna Furry, MD  oxyCODONE (OXY IR/ROXICODONE) 5 MG immediate release tablet Take 1 tablet (5 mg  total) by mouth every 4 (four) hours as needed for severe pain. Patient not taking: Reported on 07/22/2017 11/26/15   Dalia Heading, PA-C  promethazine (PHENERGAN) 25 MG tablet Take 1 tablet (25 mg total) by mouth every 8 (eight) hours as needed for nausea or vomiting. Patient not taking: Reported on 07/22/2017 11/26/15   Dalia Heading,  PA-C  propranolol (INDERAL) 20 MG tablet Take 20 mg by mouth 2 (two) times daily.    [provider]  ribavirin (REBETOL) 200 MG capsule Take 600 mg by mouth 2 (two) times daily.     [provider]  Sofosbuvir 400 MG TABS Take 400 mg by mouth daily.    [provider]     Family History  Problem Relation Age of Onset  . Cancer Mother        breast  . Cancer Father        brain  . Heart disease Brother   . Cancer Sister        breast  . Cancer Sister        breast    Social History   Social History  . Marital status: Single    Spouse name: N/A  . Number of children: 1  . Years of education: N/A   Occupational History  . Disabled, former Building control surveyor    Social History Main Topics  . Smoking status: Current Every Day Smoker    Packs/day: 1.00    Years: 38.00    Types: Cigarettes  . Smokeless tobacco: Never Used  . Alcohol use No     Comment: "not anymore. I am a recovering alcoholic."  . Drug use: No     Comment: denies "back in the 80's"  . Sexual activity: No   Other Topics Concern  . Not on file   Social History Narrative   ** Merged History Encounter **       Lives alone.  Disabled.  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  1 son.  Independent of ADLs and ambulation.      ECOG Status: 1 - Symptomatic but completely ambulatory  Review of Systems: A 12 point ROS discussed and pertinent positives are indicated in the HPI above.  All other systems are negative.  Review of Systems  Constitutional: Negative for activity change, fatigue, fever and unexpected weight change.  Respiratory: Negative.   Cardiovascular: Negative.   Gastrointestinal: Negative for abdominal distention, anal bleeding, constipation, diarrhea and vomiting.  Skin: Negative for color change.  Psychiatric/Behavioral: Negative.     Vital Signs: BP 123/61   Pulse 74   Temp 98.3 F (36.8 C) (Oral)   Resp 14   Ht 5\' 11"  (1.803 m)   Wt 200 lb (90.7 kg)   SpO2 100%    BMI 27.89 kg/m   Physical Exam  Constitutional:  Patient appears older than his stated age.  HENT:  Head: Normocephalic and atraumatic.  Eyes: Conjunctivae are normal.  Cardiovascular: Normal rate and regular rhythm.   Pulmonary/Chest: Effort normal and breath sounds normal.  Abdominal: He exhibits no distension. There is no tenderness. There is no rebound and no guarding. A hernia is present.  Patient has midline abdominal hernia.  Skin: Skin is warm and dry.  No jaundice.  Psychiatric: He has a normal mood and affect. His behavior is normal.  Nursing note and vitals reviewed.   Imaging:  Abdominal MRI - 05/21/2017; CT abdomen pelvis - 05/20/2017; 01/26/2017  Labs (all laboratory values are from 06/17/2017 is otherwise  indicated):  CBC: PLT - 59  COAGS: No results for input(s): INR, APTT in the last 8760 hours.  BMP: Cr - 1.1  LIVER FUNCTION TESTS:  Bili - 2.1  TUMOR MARKERS:  AFP - 20 (05/21/17)  Assessment and Plan:  Noah Medina is a 61 y.o. male with extensive past medical history significant for incisional hernia, prior subdural hematoma, alcohol and intravenous drug abuse, hepatitis C and cirrhosis who was found to have an indeterminate lesion within the dome of the right lobe of the liver on abdominal CT performed on 05/20/2017 for the work-up of abdominal pain. Subsequent abdominal MRI performed on 05/21/2017 confirmed the presence of this lesion as well as imaging appearance compatible with hepatocellular carcinoma. This finding is associated with a mildly elevated AFP of 20 (obtained on 05/21/2017).    The patient remains asymptomatic in regards to his hepatocellular carcinoma.  As above, the patient has been deemed a nonoperative or transplant candidate.  As such, prolonged conversations were held with the patient regarding potential percutaneous treatment options as follows:  Option #1 - Microwave Ablation - I performed a ultrasound of the liver but was unable to  visualize the known ill-defined lesion within the dome of the right lobe of the liver.  Additionally, this lesion is very ill-defined on contrast enhanced abdominal CT but is known to abut the subdiaphragmatic aspect of the anterior segment of the right lobe of the liver. Given the above, the patient is NOT a candidate for microwave ablation given my concern for incomplete lesion ablation as well as the potential for diaphragmatic injury/pneumothorax.  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  Option #3 - Y 90 radioembolization - Unfortunately, the patient is a less than ideal candidate for microwave ablation Y 90 radioembolization given his elevated bilirubin, most recently 2.1 on 7/5, with Y 90 typically only performed with bilirubin is less than 1.  However as this is the only potential treatment option for the patient, I feel that it is conceivable to proceed with a planning/mapping Y 90 arteriogram to see if sub-selective radioembolization can be safely performed, limited the embolization to just to the segment of the liver containing the hepatocellular carcinoma.  I explained that Y-90 is not curative however is performed with the intent of obtaining localize control for 3-6 months. I explained that this localized treatment would not affect any other potential developing areas of hepatocellular carcinoma.  Option #4 - continued observation with a repeat surveillance MRI and AFP level in 3 months (September 2018.  Following this conversation, the patient wishes to proceed with Y 54 radioembolization.  As such, if and when his insurance is approved, both procedures will be performed on an outpatient basis.  The importance of continued abstinence from drugs and alcohol was stressed to the patient.  Patient was encouraged to call the  interventional radiology clinic with any future questions or concerns.  Thank you for this interesting consult.  I greatly enjoyed meeting Noah Medina and look forward to participating in their care.  A copy of this report was sent to the requesting provider on this date.  Electronically Signed: Sandi Mariscal, MD 07/22/2017, 5:13 PM   I spent a total of 40 Minutes in face to face in clinical consultation, greater than 50% of which was counseling/coordinating care for hepatocellular carcinoma

## 2017-08-02 ENCOUNTER — Other Ambulatory Visit (HOSPITAL_COMMUNITY): Payer: Self-pay | Admitting: Interventional Radiology

## 2017-08-02 DIAGNOSIS — C220 Liver cell carcinoma: Secondary | ICD-10-CM

## 2017-08-03 ENCOUNTER — Other Ambulatory Visit (HOSPITAL_COMMUNITY): Payer: Self-pay | Admitting: Interventional Radiology

## 2017-08-03 ENCOUNTER — Emergency Department (HOSPITAL_COMMUNITY)
Admission: EM | Admit: 2017-08-03 | Discharge: 2017-08-03 | Disposition: A | Discharge status: Home / Self Care | Payer: Medicaid Other | Attending: Emergency Medicine | Admitting: Emergency Medicine

## 2017-08-03 DIAGNOSIS — Z5321 Procedure and treatment not carried out due to patient leaving prior to being seen by health care provider: Secondary | ICD-10-CM | POA: Insufficient documentation

## 2017-08-03 DIAGNOSIS — C220 Liver cell carcinoma: Secondary | ICD-10-CM

## 2017-08-03 DIAGNOSIS — K625 Hemorrhage of anus and rectum: Secondary | ICD-10-CM | POA: Diagnosis present

## 2017-08-03 NOTE — ED Notes (Signed)
Patient refused vitals.

## 2017-08-03 NOTE — ED Triage Notes (Signed)
Patient here from home with complaints of bright red stools today x3. Hx of hemorrhoids. ETOH. Denies n/v.

## 2017-08-03 NOTE — ED Notes (Signed)
Patient at nurses station yelling saying that he is going to leave.

## 2017-08-19 ENCOUNTER — Other Ambulatory Visit: Payer: Self-pay | Admitting: Radiology

## 2017-08-24 ENCOUNTER — Ambulatory Visit (HOSPITAL_COMMUNITY): Payer: Medicaid Other

## 2017-08-24 ENCOUNTER — Inpatient Hospital Stay (HOSPITAL_COMMUNITY): Admission: RE | Admit: 2017-08-24 | Payer: Self-pay | Source: Ambulatory Visit

## 2017-08-24 ENCOUNTER — Encounter (HOSPITAL_COMMUNITY): Payer: Medicaid Other

## 2017-08-31 ENCOUNTER — Telehealth: Payer: Self-pay | Admitting: *Deleted

## 2017-08-31 NOTE — Telephone Encounter (Signed)
I have lft several messages for Noah Medina and the [facility] case worker Noah Medina. I have not received a reply. Noah Medina contacted the [facility] to cancel the appt for the Y-90 and stated he is having treatment through the [facility].  Per Dr. Pascal Lux the appt for the procedure has been cancelled no futher attempts will be made to contact this patient at this time.Noah Medina

## 2017-09-07 ENCOUNTER — Ambulatory Visit (HOSPITAL_COMMUNITY): Payer: Self-pay

## 2017-09-07 ENCOUNTER — Other Ambulatory Visit (HOSPITAL_COMMUNITY): Payer: Medicaid Other

## 2017-09-07 ENCOUNTER — Inpatient Hospital Stay (HOSPITAL_COMMUNITY): Admission: RE | Admit: 2017-09-07 | Payer: Self-pay | Source: Ambulatory Visit

## 2017-09-07 ENCOUNTER — Ambulatory Visit (HOSPITAL_COMMUNITY): Payer: Medicaid Other

## 2017-09-07 ENCOUNTER — Other Ambulatory Visit (HOSPITAL_COMMUNITY): Payer: Self-pay

## 2017-09-07 ENCOUNTER — Encounter (HOSPITAL_COMMUNITY): Payer: Medicaid Other

## 2017-09-21 ENCOUNTER — Ambulatory Visit (HOSPITAL_COMMUNITY): Payer: Medicaid Other

## 2017-09-21 ENCOUNTER — Other Ambulatory Visit (HOSPITAL_COMMUNITY): Payer: Self-pay

## 2017-09-21 ENCOUNTER — Ambulatory Visit (HOSPITAL_COMMUNITY): Payer: Self-pay

## 2017-09-27 ENCOUNTER — Encounter: Payer: Self-pay | Admitting: Interventional Radiology

## 2017-10-26 ENCOUNTER — Emergency Department (HOSPITAL_BASED_OUTPATIENT_CLINIC_OR_DEPARTMENT_OTHER)
Admission: EM | Admit: 2017-10-26 | Discharge: 2017-10-26 | Disposition: A | Discharge status: Home / Self Care | Payer: Medicaid Other | Attending: Physician Assistant | Admitting: Physician Assistant

## 2017-10-26 ENCOUNTER — Encounter (HOSPITAL_BASED_OUTPATIENT_CLINIC_OR_DEPARTMENT_OTHER): Payer: Self-pay | Admitting: Emergency Medicine

## 2017-10-26 ENCOUNTER — Emergency Department (HOSPITAL_BASED_OUTPATIENT_CLINIC_OR_DEPARTMENT_OTHER): Payer: Medicaid Other

## 2017-10-26 ENCOUNTER — Other Ambulatory Visit: Payer: Self-pay

## 2017-10-26 DIAGNOSIS — Y939 Activity, unspecified: Secondary | ICD-10-CM | POA: Diagnosis not present

## 2017-10-26 DIAGNOSIS — Z79899 Other long term (current) drug therapy: Secondary | ICD-10-CM | POA: Diagnosis not present

## 2017-10-26 DIAGNOSIS — B1710 Acute hepatitis C without hepatic coma: Secondary | ICD-10-CM | POA: Diagnosis not present

## 2017-10-26 DIAGNOSIS — Y999 Unspecified external cause status: Secondary | ICD-10-CM | POA: Insufficient documentation

## 2017-10-26 DIAGNOSIS — Y904 Blood alcohol level of 80-99 mg/100 ml: Secondary | ICD-10-CM | POA: Diagnosis not present

## 2017-10-26 DIAGNOSIS — F17210 Nicotine dependence, cigarettes, uncomplicated: Secondary | ICD-10-CM | POA: Diagnosis not present

## 2017-10-26 DIAGNOSIS — F10920 Alcohol use, unspecified with intoxication, uncomplicated: Secondary | ICD-10-CM | POA: Diagnosis not present

## 2017-10-26 DIAGNOSIS — E860 Dehydration: Secondary | ICD-10-CM | POA: Diagnosis not present

## 2017-10-26 DIAGNOSIS — W0110XA Fall on same level from slipping, tripping and stumbling with subsequent striking against unspecified object, initial encounter: Secondary | ICD-10-CM | POA: Diagnosis not present

## 2017-10-26 DIAGNOSIS — W19XXXA Unspecified fall, initial encounter: Secondary | ICD-10-CM

## 2017-10-26 DIAGNOSIS — R42 Dizziness and giddiness: Secondary | ICD-10-CM | POA: Diagnosis present

## 2017-10-26 DIAGNOSIS — K439 Ventral hernia without obstruction or gangrene: Secondary | ICD-10-CM | POA: Diagnosis not present

## 2017-10-26 HISTORY — DX: Essential tremor: G25.0

## 2017-10-26 HISTORY — DX: Malignant (primary) neoplasm, unspecified: C80.1

## 2017-10-26 LAB — CBC
HCT: 34.8 % — ABNORMAL LOW (ref 39.0–52.0)
HEMOGLOBIN: 12.3 g/dL — AB (ref 13.0–17.0)
MCH: 34.7 pg — AB (ref 26.0–34.0)
MCHC: 35.3 g/dL (ref 30.0–36.0)
MCV: 98.3 fL (ref 78.0–100.0)
PLATELETS: 44 10*3/uL — AB (ref 150–400)
RBC: 3.54 MIL/uL — AB (ref 4.22–5.81)
RDW: 14.5 % (ref 11.5–15.5)
WBC: 3.5 10*3/uL — ABNORMAL LOW (ref 4.0–10.5)

## 2017-10-26 LAB — COMPREHENSIVE METABOLIC PANEL
ALBUMIN: 3.5 g/dL (ref 3.5–5.0)
ALK PHOS: 83 U/L (ref 38–126)
ALT: 23 U/L (ref 17–63)
ANION GAP: 6 (ref 5–15)
AST: 49 U/L — ABNORMAL HIGH (ref 15–41)
BUN: 10 mg/dL (ref 6–20)
CALCIUM: 8.9 mg/dL (ref 8.9–10.3)
CHLORIDE: 112 mmol/L — AB (ref 101–111)
CO2: 22 mmol/L (ref 22–32)
Creatinine, Ser: 0.98 mg/dL (ref 0.61–1.24)
GFR calc non Af Amer: >60 mL/min (ref >60)
GLUCOSE: 82 mg/dL (ref 65–99)
Potassium: 3.4 mmol/L — ABNORMAL LOW (ref 3.5–5.1)
SODIUM: 140 mmol/L (ref 135–145)
Total Bilirubin: 1.9 mg/dL — ABNORMAL HIGH (ref 0.3–1.2)
Total Protein: 7 g/dL (ref 6.5–8.1)

## 2017-10-26 LAB — LIPASE, BLOOD: Lipase: 28 U/L (ref 11–51)

## 2017-10-26 LAB — ACETAMINOPHEN LEVEL

## 2017-10-26 LAB — SALICYLATE LEVEL

## 2017-10-26 LAB — PROTIME-INR
INR: 1.21
PROTHROMBIN TIME: 15.2 s (ref 11.4–15.2)

## 2017-10-26 LAB — ETHANOL: ALCOHOL ETHYL (B): 97 mg/dL — AB (ref <10)

## 2017-10-26 MED ORDER — ACETAMINOPHEN 325 MG PO TABS
650.00 mg | ORAL_TABLET | ORAL | Status: DC
Start: ? — End: 2017-10-26

## 2017-10-26 MED ORDER — FOLIC ACID 1 MG PO TABS
1.00 mg | ORAL_TABLET | ORAL | Status: DC
Start: 2017-10-26 — End: 2017-10-26

## 2017-10-26 MED ORDER — HALOPERIDOL LACTATE 5 MG/ML IJ SOLN
2.00 mg | INTRAMUSCULAR | Status: DC
Start: ? — End: 2017-10-26

## 2017-10-26 MED ORDER — ONDANSETRON HCL 4 MG/2ML IJ SOLN
4.00 mg | INTRAMUSCULAR | Status: DC
Start: ? — End: 2017-10-26

## 2017-10-26 MED ORDER — THIAMINE HCL 100 MG PO TABS
100.00 mg | ORAL_TABLET | ORAL | Status: DC
Start: 2017-10-26 — End: 2017-10-26

## 2017-10-26 MED ORDER — IRON PO
1.00 | ORAL | Status: DC
Start: 2017-10-26 — End: 2017-10-26

## 2017-10-26 MED ORDER — NAPROXEN 250 MG PO TABS
500.0000 mg | ORAL_TABLET | Freq: Once | ORAL | Status: AC
  Administered 2017-10-26: 500 mg via ORAL
  Filled 2017-10-26: qty 2

## 2017-10-26 MED ORDER — LORAZEPAM 2 MG/ML IJ SOLN
2.00 mg | INTRAMUSCULAR | Status: DC
Start: ? — End: 2017-10-26

## 2017-10-26 MED ORDER — SODIUM CHLORIDE 0.9 % IV BOLUS (SEPSIS)
1000.0000 mL | Freq: Once | INTRAVENOUS | Status: AC
  Administered 2017-10-26: 1000 mL via INTRAVENOUS

## 2017-10-26 MED ORDER — IOPAMIDOL (ISOVUE-300) INJECTION 61%
100.0000 mL | Freq: Once | INTRAVENOUS | Status: AC | PRN
  Administered 2017-10-26: 100 mL via INTRAVENOUS

## 2017-10-26 NOTE — ED Notes (Signed)
Attempted to call social worker [phone] Langley Gauss

## 2017-10-26 NOTE — ED Notes (Signed)
Attempted to obtain IV access x 3.

## 2017-10-26 NOTE — ED Notes (Signed)
Pt amb in halls w/o difficulty or assist

## 2017-10-26 NOTE — ED Notes (Signed)
Lonnie ( ;friend)to transport pt home

## 2017-10-26 NOTE — ED Notes (Signed)
Patient transported to CT 

## 2017-10-26 NOTE — Discharge Instructions (Addendum)
Workup today has been reassuring, EKG, and imaging are unremarkable.  Fall today was likely due to a combination of alcohol intoxication and dehydration.  Please continue to drink lots of water over the next 24 hours.  Please follow-up with your primary care doctor at the [facility] in the next few days.  If any new or concerning symptoms develop please return to the ED for reevaluation.

## 2017-10-26 NOTE — ED Provider Notes (Signed)
[facility] EMERGENCY DEPARTMENT Provider Note   CSN: [id number] Arrival date & time: 10/26/17  1253     History   Chief Complaint Chief Complaint  Patient presents with  . Dizziness  . Alcohol Intoxication    HPI  Noah Medina is a 61 y.o. Male with a history of cirrhosis, liver cancer, alcohol abuse, ventral hernia, presents via EMS complaining of dizziness and reporting that he "passed out" and fell twice this morning, patient was noted to be incontinent of urine and stool on arrival.  Patient reports he thinks he hit his head the first time on the toilet, on the back of his head, he denies headache, neck pain, vision changes nausea or vomiting.  Reports first fall was at about 9 AM, and second was around 10 AM, this fall was at the bottom of his stairs, and bystanders saw and called EMS, reports he did not fall down the stairs.  Denies any pain in extremities, no lacerations or abrasions.  Reports he is not sure why he was falling today, reports he woke up early this morning to clean his apartment, and drink 80 ounces of beer between the hours of 7 AM and 10 AM, as he does every morning.  He reports he has cancer in a big hernia, and always has abdominal pain, denies any new pains, nausea or vomiting.  Denies any focal complaints, no chest pain or shortness of breath, patient exhibits a tremor, states this is not new and he has been diagnosed with essential tremor for several years.  Patient reports he was just discharged yesterday from [facility] where he was admitted for rectal bleeding and suicidal ideation, patient denies any rectal bleeding or dark stools since discharge.  During this time patient was treated with benzos to avoid alcohol detox.  Patient denies any suicidal or homicidal ideations today.       Past Medical History:  Diagnosis Date  . Alcohol abuse   . Blood transfusion 03-28-12   '81-hx. of bleeding ulcer  . Cirrhosis of liver ([place])   .  Essential tremor   . GERD (gastroesophageal reflux disease) 03-26-12   Hx. reflux. ulcers in past, occ. OTC med  . Hepatic cirrhosis ([place]) 2012  . Hepatitis C 03-28-12   ? 80's-prev. IV drug abuse  . Hernia of abdominal cavity 2013  . History of bleeding ulcers 03-28-12   Hx. '81  . Homelessness   . Incisional hernia    has drainage system from abdomin  . 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 Subdural hematoma ([place]) 12/13/12    Patient Active Problem List   Diagnosis Date Noted  . SBO (small bowel obstruction) ([place]) 11/07/2015  . Generalized abdominal pain   . Recurrent ventral hernia   . Small bowel obstruction ([place])   . ICH (intracerebral hemorrhage) ([place])   . SAH (subarachnoid hemorrhage) ([place]) 03/17/2015  . Homicidal ideation 09/16/2014  . 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 Partial small bowel obstruction ([place]) 01/31/2014  . Pancytopenia ([place]) 01/31/2014  . Severe recurrent major depression ([place]) 11/19/2013  . Hepatic encephalopathy ([place]) 11/18/2013  . Alcohol intoxication ([place]) 11/18/2013  . Anasarca 11/18/2013  . Tobacco use disorder 11/18/2013  . Ventral hernia 11/18/2013  . Abdominal pain, other specified site 08/19/2013  . Syncope 08/16/2013  . Alcohol abuse 08/16/2013  . GERD (gastroesophageal reflux disease) 08/16/2013  . Nausea and vomiting 08/16/2013  . Transaminitis 08/16/2013  . Subdural hematoma ([place]) 04/24/2013  .  Fall 04/24/2013    Class: Acute  . Thrombocytopenia ([place]) 04/24/2013  . Infected seroma, postoperative 07/13/2012  . MRSA (methicillin resistant staph aureus) culture positive 07/04/2012  . Ascites 07/03/2012  . Normocytic anemia 07/03/2012  . Hyponatremia 07/03/2012  . Hypokalemia 07/02/2012  . Severe malnutrition ([place]) 07/02/2012  . Status post repair of ventral hernia 09/07/2011  . Seroma, postoperative, possible abscess 07/20/2011  . Cirrhosis of liver with history of hepatitis C 07/20/2011  .  Cirrhosis ([place]) 06/29/2011    Past Surgical History:  Procedure Laterality Date  . APPENDECTOMY    . HERNIA REPAIR  08/24/11   ventral hernia with mesh  . HERNIA REPAIR  53'66   Umbilical  . INCISIONAL HERNIA REPAIR  03/26/2012  . INCISIONAL HERNIA REPAIR  07/20/2012   mesh  . IR RADIOLOGIST EVAL & MGMT  07/22/2017  . Peptic ulcer  03-26-12   open peptic ulcer surgery repair- '81  . TONSILLECTOMY    . UMBILICAL HERNIA REPAIR         Home Medications    Prior to Admission medications   Medication Sig Start Date End Date Taking? Authorizing Provider  folic acid (FOLVITE) 1 MG tablet Take 1 mg by mouth daily.    [provider]  Multiple Vitamins-Minerals (CENTRUM SILVER ADULT 50+ PO) Take 1 tablet by mouth daily.    [provider]  omeprazole (PRILOSEC) 20 MG capsule Take 1 capsule (20 mg total) by mouth 2 (two) times daily. Patient not taking: Reported on 07/22/2017 03/23/15   Tanna Furry, MD  oxyCODONE (OXY IR/ROXICODONE) 5 MG immediate release tablet Take 1 tablet (5 mg total) by mouth every 4 (four) hours as needed for severe pain. Patient not taking: Reported on 07/22/2017 11/26/15   Dalia Heading, PA-C  promethazine (PHENERGAN) 25 MG tablet Take 1 tablet (25 mg total) by mouth every 8 (eight) hours as needed for nausea or vomiting. Patient not taking: Reported on 07/22/2017 11/26/15   Dalia Heading, PA-C  propranolol (INDERAL) 20 MG tablet Take 20 mg by mouth 2 (two) times daily.    [provider]  ribavirin (REBETOL) 200 MG capsule Take 600 mg by mouth 2 (two) times daily.     [provider]  Sofosbuvir 400 MG TABS Take 400 mg by mouth daily.    [provider]    Family History Family History  Problem Relation Age of Onset  . Cancer Mother        breast  . Cancer Father        brain  . Heart disease Brother   . Cancer Sister        breast  . Cancer Sister        breast    Social History Social History    Tobacco Use  . Smoking status: Current Every Day Smoker    Packs/day: 1.00    Years: 38.00    Pack years: 38.00    Types: Cigarettes  . Smokeless tobacco: Never Used  Substance Use Topics  . Alcohol use: Yes    Comment: 84 oz beer daily  . Drug use: No    Comment: denies "back in the 80's"     Allergies   Atenolol; Propranolol; and Trazodone and nefazodone   Review of Systems Review of Systems  Constitutional: Negative for chills and fever.  HENT: Negative for congestion, rhinorrhea and sore throat.   Eyes: Negative for photophobia, pain and visual disturbance.  Respiratory: Negative for cough, chest tightness  and shortness of breath.   Cardiovascular: Negative for chest pain and palpitations.  Gastrointestinal: Positive for abdominal pain. Negative for blood in stool, constipation, diarrhea, nausea and vomiting.  Genitourinary: Negative for dysuria.  Musculoskeletal: Negative for arthralgias, back pain, myalgias and neck pain.  Skin: Negative for rash and wound.  Neurological: Positive for dizziness. Negative for numbness and headaches.     Physical Exam Updated Vital Signs BP (!) 143/80 (BP Location: Right Arm)   Pulse 87   Temp 98.5 F (36.9 C) (Oral)   Resp 18   Ht 6' (1.829 m)   Wt 90.7 kg (200 lb)   SpO2 100%   BMI 27.12 kg/m   Physical Exam  Constitutional: He appears well-developed and well-nourished. No distress.  HENT:  Head: Normocephalic and atraumatic.  Mouth/Throat: Oropharynx is clear and moist.  No scalp hematoma, NTTP, no battle's sign or racoon eye's, no hemotympanum   Eyes: Right eye exhibits no discharge. Left eye exhibits no discharge.  Neck: Normal range of motion. Neck supple.  C-spine NTTP at midline or paraspinally  Cardiovascular: Normal rate, regular rhythm, normal heart sounds and intact distal pulses.  Pulmonary/Chest: Effort normal and breath sounds normal. No stridor. No respiratory distress. He has no wheezes. He has no  rales.  Abdominal: Soft. Bowel sounds are normal. He exhibits no distension. There is tenderness. There is no guarding. A hernia (Large ventral hernia) is present.  Large ventral hernia mildly tender to palpation, pt also has some tenderness in RUQ, no lower abdominal tenderness, no guarding  Musculoskeletal: He exhibits no edema or deformity.  All joint supple and easily moveable with no obvious deformity, and all compartments soft. T-spine and L-spine NTTP at midline or paraspinally.  Neurological: He is alert. Coordination normal.  Speech is clear, able to follow commands CN III-XII intact Normal strength in upper and lower extremities bilaterally including dorsiflexion and plantar flexion, strong and equal grip strength Sensation normal to light and sharp touch Moves extremities without ataxia, coordination intact  Skin: Skin is warm and dry. Capillary refill takes less than 2 seconds. He is not diaphoretic.  Psychiatric: He has a normal mood and affect. His behavior is normal.  Nursing note and vitals reviewed.    ED Treatments / Results  Labs (all labs ordered are listed, but only abnormal results are displayed) Labs Reviewed  CBC - Abnormal; Notable for the following components:      Result Value   WBC 3.5 (*)    RBC 3.54 (*)    Hemoglobin 12.3 (*)    HCT 34.8 (*)    MCH 34.7 (*)    Platelets 44 (*)    All other components within normal limits  COMPREHENSIVE METABOLIC PANEL - Abnormal; Notable for the following components:   Potassium 3.4 (*)    Chloride 112 (*)    AST 49 (*)    Total Bilirubin 1.9 (*)    All other components within normal limits  ETHANOL - Abnormal; Notable for the following components:   Alcohol, Ethyl (B) 97 (*)    All other components within normal limits  ACETAMINOPHEN LEVEL - Abnormal; Notable for the following components:   Acetaminophen (Tylenol), Serum <10 (*)    All other components within normal limits  LIPASE, BLOOD  SALICYLATE LEVEL   PROTIME-INR    EKG  EKG Interpretation None       Radiology Ct Head Wo Contrast  Result Date: 10/26/2017 CLINICAL DATA:  Minor head trauma.  Status post  fall today. EXAM: CT HEAD WITHOUT CONTRAST CT CERVICAL SPINE WITHOUT CONTRAST TECHNIQUE: Multidetector CT imaging of the head and cervical spine was performed following the standard protocol without intravenous contrast. Multiplanar CT image reconstructions of the cervical spine were also generated. COMPARISON:  March 21, 2015 FINDINGS: CT HEAD FINDINGS Brain: No evidence of acute infarction, hemorrhage, hydrocephalus, extra-axial collection or mass lesion/mass effect. There is chronic diffuse atrophy. Chronic bilateral periventricular white matter small vessel ischemic changes noted. Vascular: No hyperdense vessel or unexpected calcification. Skull: Normal. Negative for fracture or focal lesion. Sinuses/Orbits: No acute finding. Other: None. CT CERVICAL SPINE FINDINGS Alignment: Normal. Skull base and vertebrae: No acute fracture. No primary bone lesion or focal pathologic process. Soft tissues and spinal canal: No prevertebral fluid or swelling. No visible canal hematoma. Disc levels: Degenerative joint changes with narrowed joint space and osteophyte formation are noted throughout the cervical spine. Upper chest: Evidence changes of the lung apices are noted. Other: None. IMPRESSION: No focal acute intracranial abnormality identified. No acute fracture or dislocation of cervical spine. Chronic diffuse atrophy and chronic bilateral periventricular white matter small vessel ischemic change. Electronically Signed   By: Abelardo Diesel M.D.   On: 10/26/2017 15:04   Ct Cervical Spine Wo Contrast  Result Date: 10/26/2017 CLINICAL DATA:  Minor head trauma.  Status post fall today. EXAM: CT HEAD WITHOUT CONTRAST CT CERVICAL SPINE WITHOUT CONTRAST TECHNIQUE: Multidetector CT imaging of the head and cervical spine was performed following the standard  protocol without intravenous contrast. Multiplanar CT image reconstructions of the cervical spine were also generated. COMPARISON:  March 21, 2015 FINDINGS: CT HEAD FINDINGS Brain: No evidence of acute infarction, hemorrhage, hydrocephalus, extra-axial collection or mass lesion/mass effect. There is chronic diffuse atrophy. Chronic bilateral periventricular white matter small vessel ischemic changes noted. Vascular: No hyperdense vessel or unexpected calcification. Skull: Normal. Negative for fracture or focal lesion. Sinuses/Orbits: No acute finding. Other: None. CT CERVICAL SPINE FINDINGS Alignment: Normal. Skull base and vertebrae: No acute fracture. No primary bone lesion or focal pathologic process. Soft tissues and spinal canal: No prevertebral fluid or swelling. No visible canal hematoma. Disc levels: Degenerative joint changes with narrowed joint space and osteophyte formation are noted throughout the cervical spine. Upper chest: Evidence changes of the lung apices are noted. Other: None. IMPRESSION: No focal acute intracranial abnormality identified. No acute fracture or dislocation of cervical spine. Chronic diffuse atrophy and chronic bilateral periventricular white matter small vessel ischemic change. Electronically Signed   By: Abelardo Diesel M.D.   On: 10/26/2017 15:04   Ct Abdomen Pelvis W Contrast  Result Date: 10/26/2017 CLINICAL DATA:  Generalized abdominal pain. EXAM: CT ABDOMEN AND PELVIS WITH CONTRAST TECHNIQUE: Multidetector CT imaging of the abdomen and pelvis was performed using the standard protocol following bolus administration of intravenous contrast. CONTRAST:  135mL ISOVUE-300 IOPAMIDOL (ISOVUE-300) INJECTION 61% COMPARISON:  CT scan of August 07, 2017. FINDINGS: Lower chest: No acute abnormality. Hepatobiliary: Cholelithiasis is noted without inflammation. Hepatic cirrhosis is again noted. 3.5 x 2.8 cm ill-defined low density is noted in dome of right hepatic lobe concerning for  hepatocellular carcinoma. It is slightly enlarged compared to prior exam. Pancreas: Unremarkable. No pancreatic ductal dilatation or surrounding inflammatory changes. Spleen: Mild splenomegaly is unchanged. Adrenals/Urinary Tract: Adrenal glands are unremarkable. Kidneys are normal, without renal calculi, focal lesion, or hydronephrosis. Bladder is unremarkable. Stomach/Bowel: The stomach appears normal. There is no evidence of bowel obstruction or inflammation. Appendix is not visualized. Vascular/Lymphatic: Aortic atherosclerosis. No enlarged  abdominal or pelvic lymph nodes. There is again noted extensive collateral circulation secondary to portal hypertension. This is most extensively seen in the right lower quadrant. Reproductive: Prostate is unremarkable. Other: Stable large ventral hernia is noted anteriorly in the pelvis which contains loops of large and small bowel, but does not result in incarceration or obstruction. Musculoskeletal: No acute or significant osseous findings. IMPRESSION: Hepatic cirrhosis is again noted with mild splenomegaly and extensive collateral circulation consistent with portal hypertension. 3.5 cm ill-defined low density is noted in dome of right hepatic lobe consistent with hepatocellular carcinoma. It is slightly enlarged compared to prior exam. Stable large ventral hernia is noted which contains loops of large and small bowel, but does not result in incarceration or obstruction. Cholelithiasis without inflammation. Electronically Signed   By: Marijo Conception, M.D.   On: 10/26/2017 16:29    Procedures Procedures (including critical care time)  Medications Ordered in ED Medications  sodium chloride 0.9 % bolus 1,000 mL (0 mLs Intravenous Stopped 10/26/17 1557)  iopamidol (ISOVUE-300) 61 % injection 100 mL (100 mLs Intravenous Contrast Given 10/26/17 1552)  naproxen (NAPROSYN) tablet 500 mg (500 mg Oral Given 10/26/17 1648)     Initial Impression / Assessment and Plan /  ED Course  I have reviewed the triage vital signs and the nursing notes.  Pertinent labs & imaging results that were available during my care of the patient were reviewed by me and considered in my medical decision making (see chart for details).  Patient presents via EMS complaining of dizziness and falls, with alcohol intoxication. Pt is overall well-appearing, mildly hypertensive but vitals are otherwise normal, alcohol noted on patient's breath. Does complain of some abdominal pain, mild tenderness on exam, but given patient's complicated intra-abdominal pathology with hepatic carcinoma as well as ventral hernia will obtain CT. Patient endorses head trauma from the fall, will get CT of head and neck to rule out acute fracture intracranial abnormality. Is unclear from history whether patient passed out or simply fell in the setting of alcohol intoxication.  Clinically patient appears slightly dehydrated.  Will give IV fluids and obtain labs.  Operatory evaluation shows a stable hemoglobin of 12.3, alcohol level is 97, negative for acetaminophen or salicylates.  No electrolyte derangements requiring intervention, no anion gap to suggest alcoholic ketoacidosis, lipase normal, kidney function is at baseline INR normal.  CT head and neck shows no acute intracranial abnormality or traumatic fracture from fall.  EKG is unremarkable. Abdominal CT shows cirrhosis as well as hepatocellular carcinoma, which is noted to be slightly enlarged compared to prior exam, stable large ventral hernia is present without evidence of incarceration or obstruction, there is cholelithiasis without inflammation.   Workup overall was reassuring, pain improved with Naprosyn, patient ambulated in the ED without assistance with no issues. Falls likely a combination of acute alcohol intoxication and dehydration. Patient is stable for discharge home, has follow-up appointment with his PCP later this week.  Return precautions discussed.   Patient expresses understanding and is in agreement with plan  Patient discussed with and seen by Dr. Thomasene Lot, who is in agreement with plan.  Final Clinical Impressions(s) / ED Diagnoses   Final diagnoses:  Alcoholic intoxication without complication ([place])  Dizziness  Ventral hernia without obstruction or gangrene  Fall, initial encounter  Dehydration    ED Discharge Orders    None       Janet Berlin 10/26/17 2022    Macarthur Critchley, MD 10/28/17 1043

## 2017-10-26 NOTE — ED Notes (Signed)
Pt was cleaned, assisted to use urinal; clean adult brief placed on pt.

## 2017-10-26 NOTE — ED Notes (Signed)
Pt on monitor 

## 2017-10-26 NOTE — ED Triage Notes (Signed)
Pt to ED via EMS from home with c/o dizziness since 0300; reports drinking 80 oz beer today; incontinent of stool and urine on arrival

## 2018-08-15 ENCOUNTER — Other Ambulatory Visit: Payer: Self-pay

## 2018-08-15 ENCOUNTER — Emergency Department (HOSPITAL_COMMUNITY)
Admission: EM | Admit: 2018-08-15 | Discharge: 2018-08-15 | Disposition: A | Discharge status: Home / Self Care | Payer: Medicaid Other | Attending: Emergency Medicine | Admitting: Emergency Medicine

## 2018-08-15 ENCOUNTER — Encounter (HOSPITAL_COMMUNITY): Payer: Self-pay | Admitting: Emergency Medicine

## 2018-08-15 ENCOUNTER — Emergency Department (HOSPITAL_COMMUNITY)
Admission: EM | Admit: 2018-08-15 | Discharge: 2018-08-15 | Disposition: A | Discharge status: Home / Self Care | Payer: Medicaid Other | Source: Home / Self Care | Attending: Emergency Medicine | Admitting: Emergency Medicine

## 2018-08-15 DIAGNOSIS — Z5321 Procedure and treatment not carried out due to patient leaving prior to being seen by health care provider: Secondary | ICD-10-CM | POA: Diagnosis not present

## 2018-08-15 DIAGNOSIS — D696 Thrombocytopenia, unspecified: Secondary | ICD-10-CM | POA: Insufficient documentation

## 2018-08-15 DIAGNOSIS — F17210 Nicotine dependence, cigarettes, uncomplicated: Secondary | ICD-10-CM | POA: Insufficient documentation

## 2018-08-15 DIAGNOSIS — F10929 Alcohol use, unspecified with intoxication, unspecified: Secondary | ICD-10-CM | POA: Insufficient documentation

## 2018-08-15 DIAGNOSIS — Z79899 Other long term (current) drug therapy: Secondary | ICD-10-CM

## 2018-08-15 DIAGNOSIS — F10920 Alcohol use, unspecified with intoxication, uncomplicated: Secondary | ICD-10-CM

## 2018-08-15 DIAGNOSIS — F10220 Alcohol dependence with intoxication, uncomplicated: Secondary | ICD-10-CM

## 2018-08-15 LAB — URINALYSIS, ROUTINE W REFLEX MICROSCOPIC
BACTERIA UA: NONE SEEN
Bacteria, UA: NONE SEEN
Bilirubin Urine: NEGATIVE
Bilirubin Urine: NEGATIVE
GLUCOSE, UA: NEGATIVE mg/dL
Glucose, UA: NEGATIVE mg/dL
KETONES UR: NEGATIVE mg/dL
Ketones, ur: NEGATIVE mg/dL
LEUKOCYTES UA: NEGATIVE
Leukocytes, UA: NEGATIVE
NITRITE: NEGATIVE
Nitrite: NEGATIVE
PH: 7 (ref 5.0–8.0)
PH: 6 (ref 5.0–8.0)
Protein, ur: NEGATIVE mg/dL
Protein, ur: NEGATIVE mg/dL
SPECIFIC GRAVITY, URINE: 1.023 (ref 1.005–1.030)
Specific Gravity, Urine: 1.001 — ABNORMAL LOW (ref 1.005–1.030)

## 2018-08-15 LAB — CBC
HEMATOCRIT: 37 % — AB (ref 39.0–52.0)
HEMATOCRIT: 31.5 % — AB (ref 39.0–52.0)
HEMOGLOBIN: 10.5 g/dL — AB (ref 13.0–17.0)
Hemoglobin: 12.5 g/dL — ABNORMAL LOW (ref 13.0–17.0)
MCH: 33.8 pg (ref 26.0–34.0)
MCH: 33.8 pg (ref 26.0–34.0)
MCHC: 33.8 g/dL (ref 30.0–36.0)
MCHC: 33.3 g/dL (ref 30.0–36.0)
MCV: 100 fL (ref 78.0–100.0)
MCV: 101.3 fL — ABNORMAL HIGH (ref 78.0–100.0)
Platelets: 56 10*3/uL — ABNORMAL LOW (ref 150–400)
Platelets: 44 10*3/uL — ABNORMAL LOW (ref 150–400)
RBC: 3.7 MIL/uL — AB (ref 4.22–5.81)
RBC: 3.11 MIL/uL — AB (ref 4.22–5.81)
RDW: 15 % (ref 11.5–15.5)
RDW: 15.1 % (ref 11.5–15.5)
WBC: 5.3 10*3/uL (ref 4.0–10.5)
WBC: 3.4 10*3/uL — ABNORMAL LOW (ref 4.0–10.5)

## 2018-08-15 LAB — COMPREHENSIVE METABOLIC PANEL
ALBUMIN: 2.7 g/dL — AB (ref 3.5–5.0)
ALT: 22 U/L (ref 0–44)
ALT: 21 U/L (ref 0–44)
ANION GAP: 11 (ref 5–15)
AST: 53 U/L — AB (ref 15–41)
AST: 47 U/L — ABNORMAL HIGH (ref 15–41)
Albumin: 3.2 g/dL — ABNORMAL LOW (ref 3.5–5.0)
Alkaline Phosphatase: 114 U/L (ref 38–126)
Alkaline Phosphatase: 96 U/L (ref 38–126)
Anion gap: 7 (ref 5–15)
BILIRUBIN TOTAL: 1.2 mg/dL (ref 0.3–1.2)
BUN: 11 mg/dL (ref 8–23)
BUN: 14 mg/dL (ref 8–23)
CALCIUM: 8.7 mg/dL — AB (ref 8.9–10.3)
CHLORIDE: 111 mmol/L (ref 98–111)
CO2: 25 mmol/L (ref 22–32)
CO2: 24 mmol/L (ref 22–32)
CREATININE: 0.86 mg/dL (ref 0.61–1.24)
CREATININE: 1.11 mg/dL (ref 0.61–1.24)
Calcium: 8.9 mg/dL (ref 8.9–10.3)
Chloride: 112 mmol/L — ABNORMAL HIGH (ref 98–111)
GFR calc non Af Amer: >60 mL/min (ref >60)
GFR calc non Af Amer: >60 mL/min (ref >60)
GLUCOSE: 114 mg/dL — AB (ref 70–99)
Glucose, Bld: 76 mg/dL (ref 70–99)
POTASSIUM: 3.8 mmol/L (ref 3.5–5.1)
Potassium: 4.1 mmol/L (ref 3.5–5.1)
SODIUM: 143 mmol/L (ref 135–145)
Sodium: 147 mmol/L — ABNORMAL HIGH (ref 135–145)
TOTAL PROTEIN: 5.4 g/dL — AB (ref 6.5–8.1)
Total Bilirubin: 1.5 mg/dL — ABNORMAL HIGH (ref 0.3–1.2)
Total Protein: 6.6 g/dL (ref 6.5–8.1)

## 2018-08-15 LAB — ETHANOL
Alcohol, Ethyl (B): 270 mg/dL — ABNORMAL HIGH (ref <10)
Alcohol, Ethyl (B): 152 mg/dL — ABNORMAL HIGH (ref <10)

## 2018-08-15 LAB — LIPASE, BLOOD
LIPASE: 46 U/L (ref 11–51)
LIPASE: 45 U/L (ref 11–51)

## 2018-08-15 MED ORDER — SODIUM CHLORIDE 0.9 % IV BOLUS (SEPSIS)
1000.0000 mL | Freq: Once | INTRAVENOUS | Status: AC
  Administered 2018-08-15: 1000 mL via INTRAVENOUS

## 2018-08-15 MED ORDER — LORAZEPAM 2 MG/ML IJ SOLN: 1.0000 mg | Freq: Once | INTRAMUSCULAR | Status: DC

## 2018-08-15 MED ORDER — SODIUM CHLORIDE 0.9 % IV SOLN
1000.0000 mL | INTRAVENOUS | Status: DC
  Administered 2018-08-15: 1000 mL via INTRAVENOUS

## 2018-08-15 MED ORDER — THIAMINE HCL 100 MG/ML IJ SOLN
100.0000 mg | Freq: Once | INTRAMUSCULAR | Status: AC
  Administered 2018-08-15: 100 mg via INTRAVENOUS
  Filled 2018-08-15: qty 2

## 2018-08-15 MED ORDER — FOLIC ACID 1 MG PO TABS
1.0000 mg | ORAL_TABLET | Freq: Once | ORAL | Status: AC
  Administered 2018-08-15: 1 mg via ORAL
  Filled 2018-08-15: qty 1

## 2018-08-15 NOTE — ED Notes (Signed)
Called for vitals x2, no answer

## 2018-08-15 NOTE — ED Triage Notes (Signed)
Pt states he has been drunk since 3am also c/o abd pain and thinks he may have a UTI, pt arrives via ems from [facility] incontinent of urine all over floor

## 2018-08-15 NOTE — ED Notes (Signed)
No answer in lobby.

## 2018-08-15 NOTE — ED Triage Notes (Signed)
Pt arrived [facility]. Pt was found in the street intoxicated. A bystander called EMS and upon EMS arrival pt requested to be driven to [facility]. Pt was just seen and discharged earlier today.

## 2018-08-15 NOTE — Discharge Instructions (Signed)
Please review the discharge instructions.  Consider trying to quit drinking alcohol.

## 2018-08-15 NOTE — ED Notes (Signed)
Pt up to desk asking for change to get onto the bus. Staff convinced pt to stay and wait to be seen by the doctor

## 2018-08-15 NOTE — ED Provider Notes (Signed)
[facility] EMERGENCY DEPARTMENT Provider Note   CSN: [id number] Arrival date & time: 08/15/18  1754     History   Chief Complaint Chief Complaint  Patient presents with  . Alcohol Intoxication    HPI Noah Medina is a 62 y.o. male.  HPI Patient arrived by EMS.  Patient has a history of alcoholism.  Patient admits to drinking regularly.  Patient was in the ED earlier today but ended up leaving before being evaluated.  Patient states he started drinking once he left leaving the hospital.  She was found in the street intoxicated.  Bystanders called EMS.  Patient states he has been having some trouble with abdominal pain.  He wonders if he might have an infection.  He denies any trouble with chest pain or shortness of breath.  No vomiting or diarrhea.  Past Medical History:  Diagnosis Date  . Alcohol abuse   . Blood transfusion 03-28-12   '81-hx. of bleeding ulcer  . Cancer ([place])   . Cirrhosis of liver ([place])   . Essential tremor   . GERD (gastroesophageal reflux disease) 03-26-12   Hx. reflux. ulcers in past, occ. OTC med  . Hepatic cirrhosis ([place]) 2012  . Hepatitis C 03-28-12   ? 80's-prev. IV drug abuse  . Hernia of abdominal cavity 2013  . History of bleeding ulcers 03-28-12   Hx. '81  . Homelessness   . Incisional hernia    has drainage system from abdomin  . 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 Subdural hematoma ([place]) 12/13/12    Patient Active Problem List   Diagnosis Date Noted  . SBO (small bowel obstruction) ([place]) 11/07/2015  . Generalized abdominal pain   . Recurrent ventral hernia   . Small bowel obstruction ([place])   . ICH (intracerebral hemorrhage) ([place])   . SAH (subarachnoid hemorrhage) ([place]) 03/17/2015  . Homicidal ideation 09/16/2014  . 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 Partial small bowel obstruction ([place]) 01/31/2014  . Pancytopenia ([place]) 01/31/2014  . Severe recurrent major depression  ([place]) 11/19/2013  . Hepatic encephalopathy ([place]) 11/18/2013  . Alcohol intoxication ([place]) 11/18/2013  . Anasarca 11/18/2013  . Tobacco use disorder 11/18/2013  . Ventral hernia 11/18/2013  . Abdominal pain, other specified site 08/19/2013  . Syncope 08/16/2013  . Alcohol abuse 08/16/2013  . GERD (gastroesophageal reflux disease) 08/16/2013  . Nausea and vomiting 08/16/2013  . Transaminitis 08/16/2013  . Subdural hematoma ([place]) 04/24/2013  . Fall 04/24/2013    Class: Acute  . Thrombocytopenia ([place]) 04/24/2013  . Infected seroma, postoperative 07/13/2012  . MRSA (methicillin resistant staph aureus) culture positive 07/04/2012  . Ascites 07/03/2012  . Normocytic anemia 07/03/2012  . Hyponatremia 07/03/2012  . Hypokalemia 07/02/2012  . Severe malnutrition ([place]) 07/02/2012  . Status post repair of ventral hernia 09/07/2011  . Seroma, postoperative, possible abscess 07/20/2011  . Cirrhosis of liver with history of hepatitis C 07/20/2011  . Cirrhosis ([place]) 06/29/2011    Past Surgical History:  Procedure Laterality Date  . APPENDECTOMY    . HERNIA REPAIR  08/24/11   ventral hernia with mesh  . HERNIA REPAIR  73'41   Umbilical  . INCISIONAL HERNIA REPAIR  03/26/2012  . INCISIONAL HERNIA REPAIR  03/30/2012   Procedure: LAPAROSCOPIC INCISIONAL HERNIA;  Surgeon: Harl Bowie, MD;  Location: [facility];  Service: General;  Laterality: N/A;  Laparoscopic Incisional/ventral Hernia Repair with Mesh  . INCISIONAL HERNIA REPAIR  07/20/2012   mesh  .  INCISIONAL HERNIA REPAIR  07/20/2012   Procedure: HERNIA REPAIR INCISIONAL;  Surgeon: Harl Bowie, MD;  Location: [facility];  Service: General;  Laterality: N/A;  repair of incisional hernia with Biologic mesh  . IR RADIOLOGIST EVAL & MGMT  07/22/2017  . Peptic ulcer  03-26-12   open peptic ulcer surgery repair- '81  . TONSILLECTOMY    . UMBILICAL HERNIA REPAIR          Home Medications    Prior to Admission medications   Medication Sig  Start Date End Date Taking? Authorizing Provider  folic acid (FOLVITE) 1 MG tablet Take 1 mg by mouth daily.    [provider]  Multiple Vitamins-Minerals (CENTRUM SILVER ADULT 50+ PO) Take 1 tablet by mouth daily.    [provider]  omeprazole (PRILOSEC) 20 MG capsule Take 1 capsule (20 mg total) by mouth 2 (two) times daily. Patient not taking: Reported on 07/22/2017 03/23/15   Tanna Furry, MD  oxyCODONE (OXY IR/ROXICODONE) 5 MG immediate release tablet Take 1 tablet (5 mg total) by mouth every 4 (four) hours as needed for severe pain. Patient not taking: Reported on 07/22/2017 11/26/15   Dalia Heading, PA-C  promethazine (PHENERGAN) 25 MG tablet Take 1 tablet (25 mg total) by mouth every 8 (eight) hours as needed for nausea or vomiting. Patient not taking: Reported on 07/22/2017 11/26/15   Dalia Heading, PA-C  propranolol (INDERAL) 20 MG tablet Take 20 mg by mouth 2 (two) times daily.    [provider]  ribavirin (REBETOL) 200 MG capsule Take 600 mg by mouth 2 (two) times daily.     [provider]  Sofosbuvir 400 MG TABS Take 400 mg by mouth daily.    [provider]    Family History Family History  Problem Relation Age of Onset  . Cancer Mother        breast  . Cancer Father        brain  . Heart disease Brother   . Cancer Sister        breast  . Cancer Sister        breast    Social History Social History   Tobacco Use  . Smoking status: Current Every Day Smoker    Packs/day: 1.00    Years: 38.00    Pack years: 38.00    Types: Cigarettes  . Smokeless tobacco: Never Used  Substance Use Topics  . Alcohol use: Yes    Comment: 84 oz beer daily  . Drug use: No    Types: Marijuana, Methamphetamines, IV, Cocaine    Comment: denies "back in the 80's"     Allergies   Atenolol; Propranolol; and Trazodone and nefazodone   Review of Systems Review of Systems  All other systems reviewed and are  negative.    Physical Exam Updated Vital Signs BP (!) 132/59 (BP Location: Right Arm)   Pulse 100   Temp 98.6 F (37 C)   Resp 18   SpO2 96%   Physical Exam  Constitutional: No distress.  Appears chronically ill, tremulous  HENT:  Head: Normocephalic and atraumatic.  Right Ear: External ear normal.  Left Ear: External ear normal.  Eyes: Conjunctivae are normal. Right eye exhibits no discharge. Left eye exhibits no discharge. No scleral icterus.  Neck: Neck supple. No tracheal deviation present.  Cardiovascular: Normal rate, regular rhythm and intact distal pulses.  Pulmonary/Chest: Effort normal and breath sounds normal. No stridor. No respiratory distress. He has  no wheezes. He has no rales.  Abdominal: Soft. Bowel sounds are normal. He exhibits no distension. There is no tenderness. There is no rebound and no guarding. A hernia is present. Hernia confirmed positive in the ventral area.  Hernia soft without evidence of erythema or incarceration  Musculoskeletal: He exhibits edema. He exhibits no tenderness.  Neurological: He is alert. He has normal strength. No cranial nerve deficit (no facial droop, extraocular movements intact, no slurred speech) or sensory deficit. He exhibits normal muscle tone. He displays no seizure activity. Coordination normal.  Skin: Skin is warm. No rash noted. He is diaphoretic.  Psychiatric: He has a normal mood and affect.  Nursing note and vitals reviewed.    ED Treatments / Results  Labs (all labs ordered are listed, but only abnormal results are displayed) Labs Reviewed  CBC - Abnormal; Notable for the following components:      Result Value   WBC 3.4 (*)    RBC 3.11 (*)    Hemoglobin 10.5 (*)    HCT 31.5 (*)    MCV 101.3 (*)    Platelets 44 (*)    All other components within normal limits  COMPREHENSIVE METABOLIC PANEL - Abnormal; Notable for the following components:   Sodium 147 (*)    Chloride 112 (*)    Glucose, Bld 114 (*)     Calcium 8.7 (*)    Total Protein 5.4 (*)    Albumin 2.7 (*)    AST 47 (*)    All other components within normal limits  URINALYSIS, ROUTINE W REFLEX MICROSCOPIC - Abnormal; Notable for the following components:   Hgb urine dipstick MODERATE (*)    All other components within normal limits  ETHANOL - Abnormal; Notable for the following components:   Alcohol, Ethyl (B) 152 (*)    All other components within normal limits  LIPASE, BLOOD    EKG EKG Interpretation  Date/Time:  Monday August 15 2018 19:22:58 EDT Ventricular Rate:  106 PR Interval:  158 QRS Duration: 94 QT Interval:  350 QTC Calculation: 464 R Axis:   55 Text Interpretation:  Sinus tachycardia Otherwise normal ECG Since last tracing rate faster Confirmed by Dorie Rank [phone]) on 08/15/2018 7:27:22 PM   Radiology No results found.  Procedures Procedures (including critical care time)  Medications Ordered in ED Medications  sodium chloride 0.9 % bolus 1,000 mL (0 mLs Intravenous Stopped 08/15/18 2043)    Followed by  0.9 %  sodium chloride infusion (1,000 mLs Intravenous New Bag/Given 08/15/18 2120)  LORazepam (ATIVAN) injection 1 mg (0 mg Intravenous Hold 08/15/18 1940)  thiamine (B-1) injection 100 mg (100 mg Intravenous Given 01/20/05 2376)  folic acid (FOLVITE) tablet 1 mg (1 mg Oral Given 08/15/18 2031)     Initial Impression / Assessment and Plan / ED Course  I have reviewed the triage vital signs and the nursing notes.  Pertinent labs & imaging results that were available during my care of the patient were reviewed by me and considered in my medical decision making (see chart for details).  Clinical Course as of Aug 15 2130  Mon Aug 15, 2018  2130 Hemoglobin drop noted from a couple hours ago.  Likely related to hydration.  Patient has not had any bleeding  CBC(!) [JK]    Clinical Course User Index [JK] Dorie Rank, MD   Patient presented to the emergency room for alcohol intoxication.  Patient's  laboratory tests are consistent with his chronic abnormalities.  He  has anemia, thrombocytopenia and low protein levels associated with his chronic liver disease.  Patient was monitored in the emergency room.  No signs of withdrawal.  He appears stable for discharge.  Final Clinical Impressions(s) / ED Diagnoses   Final diagnoses:  Alcoholic intoxication without complication ([place])  Thrombocytopenia [facility])    ED Discharge Orders    None       Dorie Rank, MD 08/15/18 2131

## 2019-09-07 IMAGING — CT CT CERVICAL SPINE W/O CM
4 of 7 series · 14 of 33 positions shown, 15 images · non-contrast
Comparison: March 21, 2015

CLINICAL DATA: Minor head trauma.  Status post fall today.

EXAM:
CT HEAD WITHOUT CONTRAST
CT CERVICAL SPINE WITHOUT CONTRAST
TECHNIQUE: Multidetector CT imaging of the head and cervical spine was
performed following the standard protocol without intravenous
contrast. Multiplanar CT image reconstructions of the cervical spine
were also generated.

[Series 7: c_spine 2.0 i30s 3 · axial · 0.37mm/px · z∈[+1209,+1307]mm · 4 of 83 slices shown]
[im 17/83  bone]
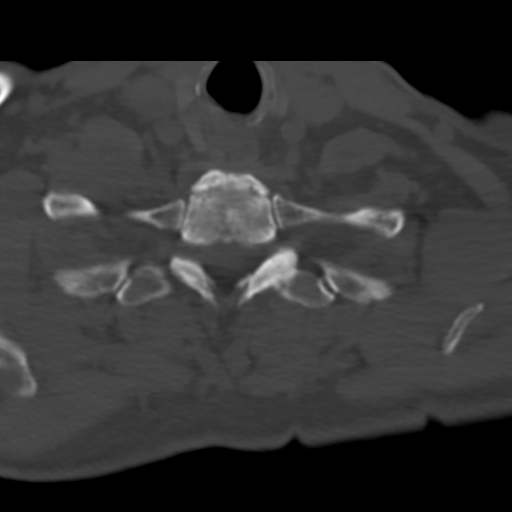
[im 33/83  bone]
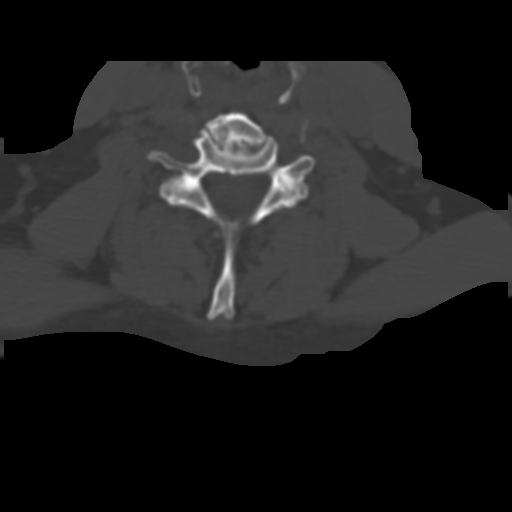
[im 50/83  bone]
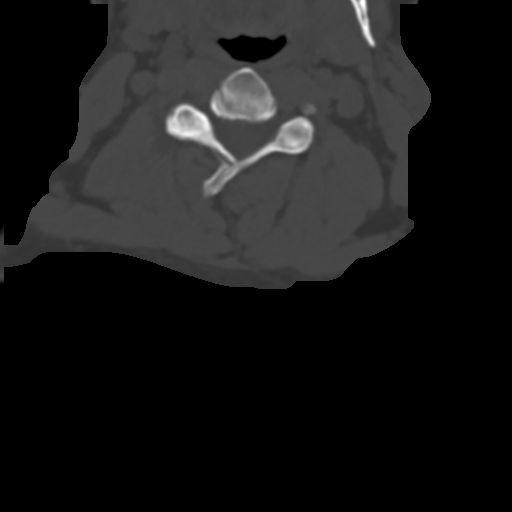
[im 66/83  bone]
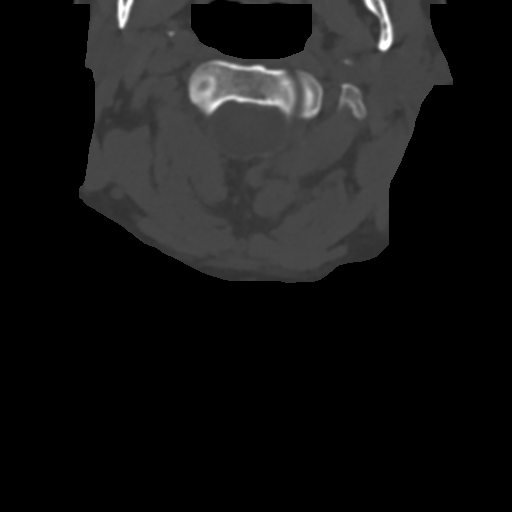

[Series 9: coronals · coronal · 0.36mm/px · 1 of 61 slices shown]
[im 31/61  bone]
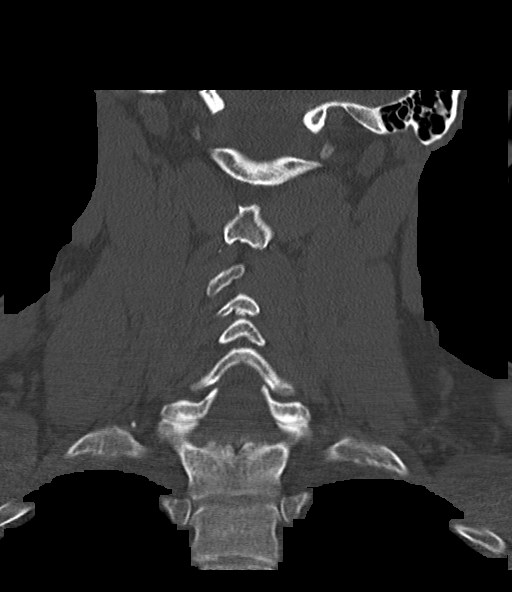

[Series 10: sagittals · sagittal · 0.31mm/px · 5 of 65 slices shown]
[im 11/65  bone]
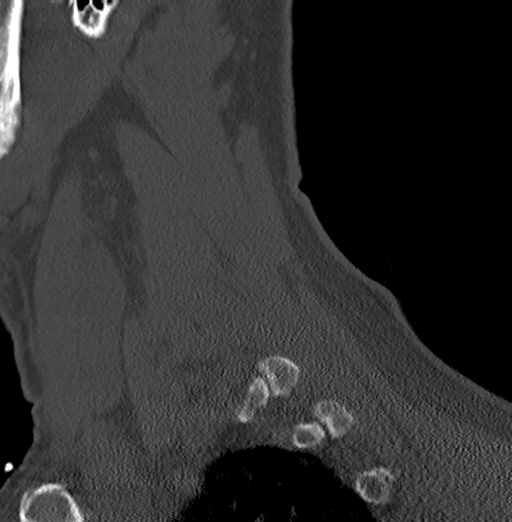
[im 22/65  bone]
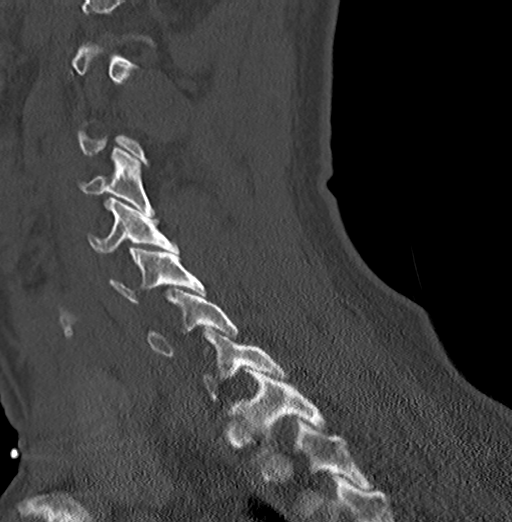
[im 33/65  bone]
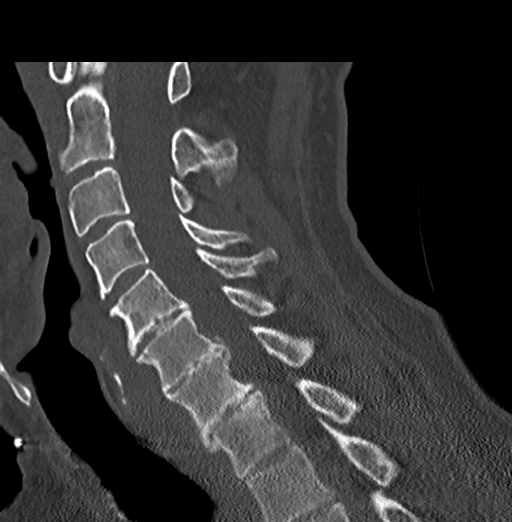
[im 43/65  bone]
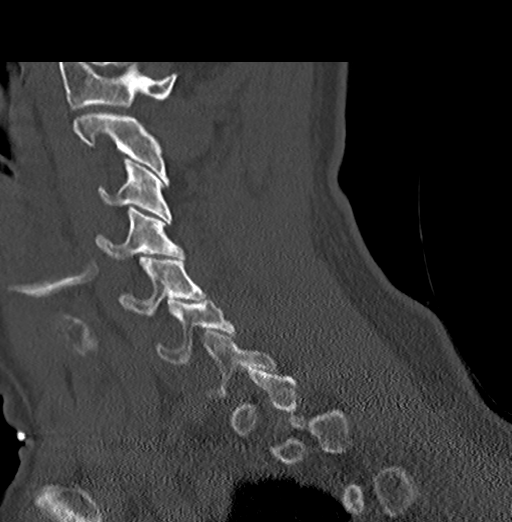
[im 54/65  bone]
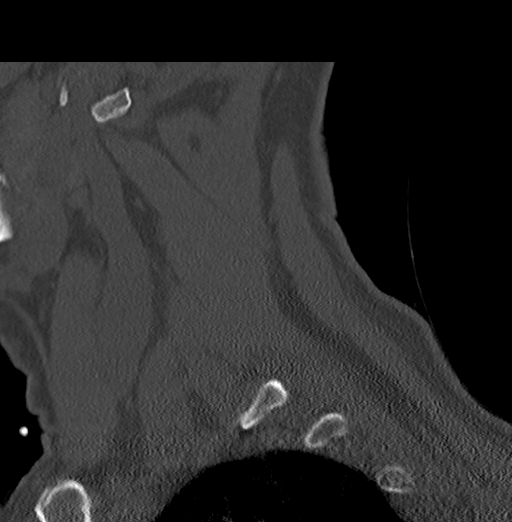

[Series 11: orthogonals · axial · 0.23mm/px · z∈[+1187,+1298]mm · 4 of 97 slices shown, 5 images]
[im 20/97  soft-tissue]
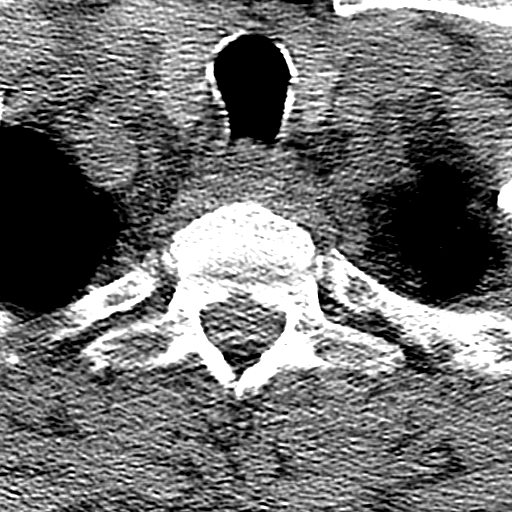
[im 20/97  bone]
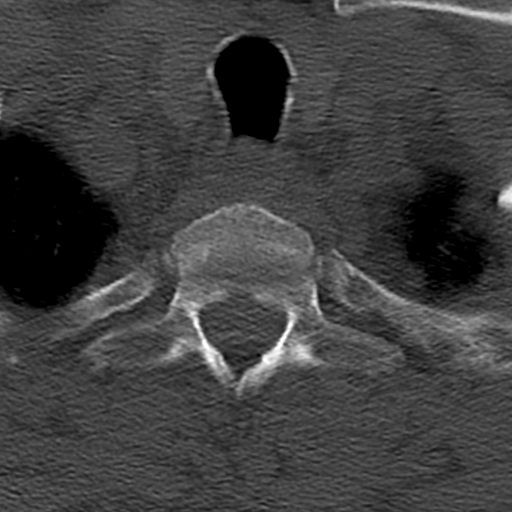
[im 39/97  bone]
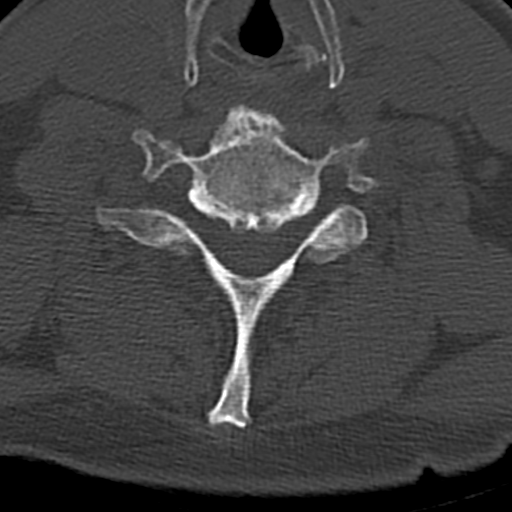
[im 58/97  bone]
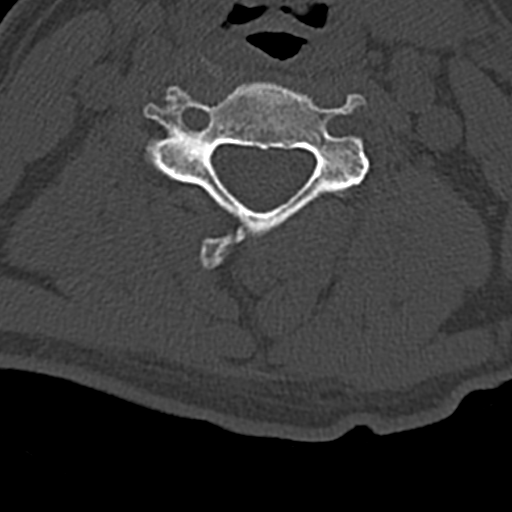
[im 77/97  bone]
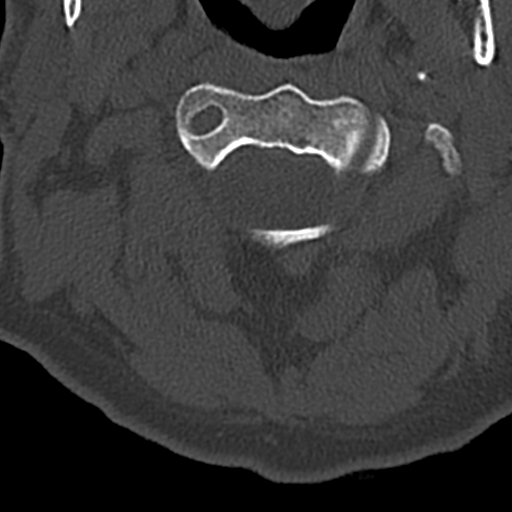

[14 of 33 positions shown; findings below may reference images not displayed]

FINDINGS: CT HEAD FINDINGS

Brain: No evidence of acute infarction, hemorrhage, hydrocephalus,
extra-axial collection or mass lesion/mass effect. There is chronic
diffuse atrophy. Chronic bilateral periventricular white matter
small vessel ischemic changes noted.

Vascular: No hyperdense vessel or unexpected calcification.

Skull: Normal. Negative for fracture or focal lesion.

Sinuses/Orbits: No acute finding.

Other: None.

CT CERVICAL SPINE FINDINGS

Alignment: Normal.

Skull base and vertebrae: No acute fracture. No primary bone lesion
or focal pathologic process.

Soft tissues and spinal canal: No prevertebral fluid or swelling. No
visible canal hematoma.

Disc levels: Degenerative joint changes with narrowed joint space
and osteophyte formation are noted throughout the cervical spine.

Upper chest: Evidence changes of the lung apices are noted.

Other: None.
IMPRESSION: No focal acute intracranial abnormality identified.

No acute fracture or dislocation of cervical spine.

Chronic diffuse atrophy and chronic bilateral periventricular white
matter small vessel ischemic change.

## 2020-02-10 ENCOUNTER — Emergency Department (HOSPITAL_COMMUNITY)
Admission: EM | Admit: 2020-02-10 | Discharge: 2020-02-10 | Disposition: A | Discharge status: Home / Self Care | Payer: Medicaid Other | Attending: Emergency Medicine | Admitting: Emergency Medicine

## 2020-02-10 ENCOUNTER — Other Ambulatory Visit: Payer: Self-pay

## 2020-02-10 ENCOUNTER — Emergency Department (HOSPITAL_COMMUNITY): Payer: Medicaid Other

## 2020-02-10 ENCOUNTER — Encounter (HOSPITAL_COMMUNITY): Payer: Self-pay | Admitting: *Deleted

## 2020-02-10 DIAGNOSIS — R0789 Other chest pain: Secondary | ICD-10-CM | POA: Insufficient documentation

## 2020-02-10 DIAGNOSIS — K920 Hematemesis: Secondary | ICD-10-CM | POA: Insufficient documentation

## 2020-02-10 DIAGNOSIS — Z5321 Procedure and treatment not carried out due to patient leaving prior to being seen by health care provider: Secondary | ICD-10-CM | POA: Diagnosis not present

## 2020-02-10 LAB — CBC
HCT: 36.3 % — ABNORMAL LOW (ref 39.0–52.0)
Hemoglobin: 11.6 g/dL — ABNORMAL LOW (ref 13.0–17.0)
MCH: 33.7 pg (ref 26.0–34.0)
MCHC: 32 g/dL (ref 30.0–36.0)
MCV: 105.5 fL — ABNORMAL HIGH (ref 80.0–100.0)
Platelets: 47 10*3/uL — ABNORMAL LOW (ref 150–400)
RBC: 3.44 MIL/uL — ABNORMAL LOW (ref 4.22–5.81)
RDW: 15.7 % — ABNORMAL HIGH (ref 11.5–15.5)
WBC: 6.5 10*3/uL (ref 4.0–10.5)
nRBC: 0 % (ref 0.0–0.2)

## 2020-02-10 LAB — BASIC METABOLIC PANEL
Anion gap: 9 (ref 5–15)
BUN: 9 mg/dL (ref 8–23)
CO2: 22 mmol/L (ref 22–32)
Calcium: 8.5 mg/dL — ABNORMAL LOW (ref 8.9–10.3)
Chloride: 110 mmol/L (ref 98–111)
Creatinine, Ser: 0.96 mg/dL (ref 0.61–1.24)
GFR calc Af Amer: >60 mL/min (ref >60)
GFR calc non Af Amer: >60 mL/min (ref >60)
Glucose, Bld: 95 mg/dL (ref 70–99)
Potassium: 3.5 mmol/L (ref 3.5–5.1)
Sodium: 141 mmol/L (ref 135–145)

## 2020-02-10 LAB — TROPONIN I (HIGH SENSITIVITY): Troponin I (High Sensitivity): 13 ng/L (ref <18)

## 2020-02-10 LAB — ETHANOL: Alcohol, Ethyl (B): 215 mg/dL — ABNORMAL HIGH (ref <10)

## 2020-02-10 MED ORDER — SODIUM CHLORIDE 0.9% FLUSH: 3.0000 mL | Freq: Once | INTRAVENOUS | Status: DC

## 2020-02-10 NOTE — ED Triage Notes (Signed)
The pt arrived by [facility] w/[facility]  Pt c/o rt sided chest pain for 3 days  He drinks 1 six pack of beer every day he just moved here from [place]  He usually goes there   C/o n v and vomiting blood

## 2020-02-10 NOTE — ED Provider Notes (Cosign Needed)
Pt eloped from the ED prior to me being able to evaluate him. I did not establish care with him.    Rodney Booze, [place] 02/10/20 1832

## 2020-02-10 NOTE — ED Notes (Addendum)
Pt brought from waiting room. Pt refused gown. Pt urinated on the bed and asked for a brief.  I cleaned the bed and offered pt our brief and gown, he was not willing to wear the type of brief we have and said he was going to leave. I got the EDP to come to the room, but pt had left and exited the ED at the [place] room.

## 2020-02-17 ENCOUNTER — Other Ambulatory Visit: Payer: Self-pay

## 2020-02-17 ENCOUNTER — Encounter (HOSPITAL_COMMUNITY): Payer: Self-pay | Admitting: *Deleted

## 2020-02-17 ENCOUNTER — Emergency Department (HOSPITAL_COMMUNITY)
Admission: EM | Admit: 2020-02-17 | Discharge: 2020-02-17 | Discharge status: Against Medical Advice | Payer: Medicaid Other | Attending: Emergency Medicine | Admitting: Emergency Medicine
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
## 2020-02-17 NOTE — ED Notes (Signed)
Called patient several times and no answer.  This nurse does not see pt in waiting room.

## 2020-02-17 NOTE — ED Notes (Signed)
UA sample taken, placed in ED-0Lab

## 2020-04-24 ENCOUNTER — Emergency Department (HOSPITAL_COMMUNITY)
Admission: EM | Admit: 2020-04-24 | Discharge: 2020-04-24 | Discharge status: Against Medical Advice | Payer: Medicaid Other | Attending: Emergency Medicine | Admitting: Emergency Medicine

## 2020-04-24 NOTE — ED Notes (Signed)
Pt hopped off bed, stable gait stating "I'm going to get the fuck outta here." this RN attempted to explain that we would like to medically clear him to which pt responded "stop talking to me bitch, I do what I want."  Pt escorted out by security.
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
## 2020-04-28 ENCOUNTER — Emergency Department (HOSPITAL_COMMUNITY): Payer: Medicaid Other

## 2020-04-28 ENCOUNTER — Encounter (HOSPITAL_COMMUNITY): Payer: Self-pay

## 2020-04-28 ENCOUNTER — Emergency Department (HOSPITAL_COMMUNITY)
Admission: EM | Admit: 2020-04-28 | Discharge: 2020-04-28 | Disposition: A | Discharge status: Home / Self Care | Payer: Medicaid Other | Attending: Emergency Medicine | Admitting: Emergency Medicine

## 2020-04-28 ENCOUNTER — Other Ambulatory Visit: Payer: Self-pay

## 2020-04-28 DIAGNOSIS — S060X9A Concussion with loss of consciousness of unspecified duration, initial encounter: Secondary | ICD-10-CM | POA: Diagnosis not present

## 2020-04-28 DIAGNOSIS — R911 Solitary pulmonary nodule: Secondary | ICD-10-CM | POA: Insufficient documentation

## 2020-04-28 DIAGNOSIS — Y998 Other external cause status: Secondary | ICD-10-CM | POA: Diagnosis not present

## 2020-04-28 DIAGNOSIS — Z590 Homelessness: Secondary | ICD-10-CM | POA: Insufficient documentation

## 2020-04-28 DIAGNOSIS — Y9389 Activity, other specified: Secondary | ICD-10-CM | POA: Diagnosis not present

## 2020-04-28 DIAGNOSIS — Z23 Encounter for immunization: Secondary | ICD-10-CM | POA: Insufficient documentation

## 2020-04-28 DIAGNOSIS — Y92821 Forest as the place of occurrence of the external cause: Secondary | ICD-10-CM | POA: Diagnosis not present

## 2020-04-28 DIAGNOSIS — W010XXA Fall on same level from slipping, tripping and stumbling without subsequent striking against object, initial encounter: Secondary | ICD-10-CM | POA: Diagnosis not present

## 2020-04-28 DIAGNOSIS — F17210 Nicotine dependence, cigarettes, uncomplicated: Secondary | ICD-10-CM | POA: Diagnosis not present

## 2020-04-28 DIAGNOSIS — Z859 Personal history of malignant neoplasm, unspecified: Secondary | ICD-10-CM | POA: Insufficient documentation

## 2020-04-28 DIAGNOSIS — S0990XA Unspecified injury of head, initial encounter: Secondary | ICD-10-CM | POA: Diagnosis present

## 2020-04-28 LAB — COMPREHENSIVE METABOLIC PANEL
ALT: 27 U/L (ref 0–44)
AST: 49 U/L — ABNORMAL HIGH (ref 15–41)
Albumin: 2.9 g/dL — ABNORMAL LOW (ref 3.5–5.0)
Alkaline Phosphatase: 54 U/L (ref 38–126)
Anion gap: 8 (ref 5–15)
BUN: 9 mg/dL (ref 8–23)
CO2: 23 mmol/L (ref 22–32)
Calcium: 8.3 mg/dL — ABNORMAL LOW (ref 8.9–10.3)
Chloride: 109 mmol/L (ref 98–111)
Creatinine, Ser: 0.84 mg/dL (ref 0.61–1.24)
GFR calc Af Amer: >60 mL/min (ref >60)
GFR calc non Af Amer: >60 mL/min (ref >60)
Glucose, Bld: 83 mg/dL (ref 70–99)
Potassium: 3.5 mmol/L (ref 3.5–5.1)
Sodium: 140 mmol/L (ref 135–145)
Total Bilirubin: 2.4 mg/dL — ABNORMAL HIGH (ref 0.3–1.2)
Total Protein: 5.9 g/dL — ABNORMAL LOW (ref 6.5–8.1)

## 2020-04-28 LAB — URINALYSIS, ROUTINE W REFLEX MICROSCOPIC
Bacteria, UA: NONE SEEN
Bilirubin Urine: NEGATIVE
Glucose, UA: NEGATIVE mg/dL
Ketones, ur: NEGATIVE mg/dL
Leukocytes,Ua: NEGATIVE
Nitrite: NEGATIVE
Protein, ur: NEGATIVE mg/dL
Specific Gravity, Urine: 1.005 (ref 1.005–1.030)
pH: 6 (ref 5.0–8.0)

## 2020-04-28 LAB — CBC WITH DIFFERENTIAL/PLATELET
Abs Immature Granulocytes: 0.01 10*3/uL (ref 0.00–0.07)
Basophils Absolute: 0 10*3/uL (ref 0.0–0.1)
Basophils Relative: 1 %
Eosinophils Absolute: 0.1 10*3/uL (ref 0.0–0.5)
Eosinophils Relative: 3 %
HCT: 32.4 % — ABNORMAL LOW (ref 39.0–52.0)
Hemoglobin: 10.8 g/dL — ABNORMAL LOW (ref 13.0–17.0)
Immature Granulocytes: 0 %
Lymphocytes Relative: 22 %
Lymphs Abs: 1 10*3/uL (ref 0.7–4.0)
MCH: 33.1 pg (ref 26.0–34.0)
MCHC: 33.3 g/dL (ref 30.0–36.0)
MCV: 99.4 fL (ref 80.0–100.0)
Monocytes Absolute: 0.6 10*3/uL (ref 0.1–1.0)
Monocytes Relative: 14 %
Neutro Abs: 2.6 10*3/uL (ref 1.7–7.7)
Neutrophils Relative %: 60 %
Platelets: 64 10*3/uL — ABNORMAL LOW (ref 150–400)
RBC: 3.26 MIL/uL — ABNORMAL LOW (ref 4.22–5.81)
RDW: 14.6 % (ref 11.5–15.5)
WBC: 4.3 10*3/uL (ref 4.0–10.5)
nRBC: 0 % (ref 0.0–0.2)

## 2020-04-28 LAB — LACTIC ACID, PLASMA: Lactic Acid, Venous: 2.8 mmol/L (ref 0.5–1.9)

## 2020-04-28 LAB — CBG MONITORING, ED: Glucose-Capillary: 71 mg/dL (ref 70–99)

## 2020-04-28 MED ORDER — ACETAMINOPHEN 325 MG PO TABS
650.0000 mg | ORAL_TABLET | Freq: Once | ORAL | Status: AC
  Administered 2020-04-28: 650 mg via ORAL
  Filled 2020-04-28: qty 2

## 2020-04-28 MED ORDER — HEPARIN SOD (PORK) LOCK FLUSH 100 UNIT/ML IV SOLN
500.0000 [IU] | Freq: Once | INTRAVENOUS | Status: AC
  Administered 2020-04-28: 500 [IU]
  Filled 2020-04-28: qty 5

## 2020-04-28 MED ORDER — TETANUS-DIPHTH-ACELL PERTUSSIS 5-2.5-18.5 LF-MCG/0.5 IM SUSP
0.5000 mL | Freq: Once | INTRAMUSCULAR | Status: AC
  Administered 2020-04-28: 0.5 mL via INTRAMUSCULAR
  Filled 2020-04-28: qty 0.5

## 2020-04-28 MED ORDER — SODIUM CHLORIDE 0.9 % IV BOLUS
1000.0000 mL | Freq: Once | INTRAVENOUS | Status: AC
  Administered 2020-04-28: 1000 mL via INTRAVENOUS

## 2020-04-28 MED ORDER — MORPHINE SULFATE (PF) 4 MG/ML IV SOLN
4.0000 mg | Freq: Once | INTRAVENOUS | Status: AC
  Administered 2020-04-28: 4 mg via INTRAVENOUS
  Filled 2020-04-28: qty 1

## 2020-04-28 NOTE — ED Notes (Signed)
Patient fluid/PO challenge.

## 2020-04-28 NOTE — ED Notes (Signed)
Pt ambulated in room to door and back to bed without any assist
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
## 2020-04-30 ENCOUNTER — Encounter (HOSPITAL_COMMUNITY): Payer: Self-pay

## 2020-04-30 ENCOUNTER — Emergency Department (HOSPITAL_COMMUNITY)
Admission: EM | Admit: 2020-04-30 | Discharge: 2020-04-30 | Disposition: A | Discharge status: Home / Self Care | Payer: Medicaid Other | Attending: Emergency Medicine | Admitting: Emergency Medicine

## 2020-04-30 ENCOUNTER — Other Ambulatory Visit: Payer: Self-pay

## 2020-04-30 DIAGNOSIS — S0990XD Unspecified injury of head, subsequent encounter: Secondary | ICD-10-CM | POA: Diagnosis not present

## 2020-04-30 DIAGNOSIS — F0781 Postconcussional syndrome: Secondary | ICD-10-CM | POA: Insufficient documentation

## 2020-04-30 DIAGNOSIS — F17210 Nicotine dependence, cigarettes, uncomplicated: Secondary | ICD-10-CM | POA: Diagnosis not present

## 2020-04-30 DIAGNOSIS — R519 Headache, unspecified: Secondary | ICD-10-CM | POA: Insufficient documentation

## 2020-04-30 LAB — URINALYSIS, ROUTINE W REFLEX MICROSCOPIC
Bacteria, UA: NONE SEEN
Bilirubin Urine: NEGATIVE
Glucose, UA: NEGATIVE mg/dL
Ketones, ur: 5 mg/dL — AB
Leukocytes,Ua: NEGATIVE
Nitrite: NEGATIVE
Protein, ur: NEGATIVE mg/dL
Specific Gravity, Urine: 1.023 (ref 1.005–1.030)
pH: 5 (ref 5.0–8.0)

## 2020-04-30 MED ORDER — IBUPROFEN 800 MG PO TABS
800.0000 mg | ORAL_TABLET | Freq: Once | ORAL | Status: AC
  Administered 2020-04-30: 800 mg via ORAL
  Filled 2020-04-30: qty 1

## 2020-04-30 MED ORDER — ACETAMINOPHEN 325 MG PO TABS: 650.0000 mg | ORAL_TABLET | Freq: Once | ORAL | Status: DC

## 2020-04-30 NOTE — ED Notes (Signed)
Per EDP, labs can be cancelled.
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
## 2020-04-30 NOTE — ED Notes (Signed)
Pt provided ham sandwich, per MD request.

## 2020-04-30 NOTE — ED Notes (Signed)
Pt difficult stick due to shaking, some blood sent to lab . Will probably need port accessed in back.

## 2021-01-14 DEATH — deceased
